# Patient Record
Sex: Male | Born: 1935
Health system: Southern US, Community
[De-identification: ages and names within clinical notes are randomized; demographics above are authoritative.]

## PROBLEM LIST (undated history)

## (undated) DIAGNOSIS — I469 Cardiac arrest, cause unspecified: Secondary | ICD-10-CM

## (undated) DIAGNOSIS — J9600 Acute respiratory failure, unspecified whether with hypoxia or hypercapnia: Secondary | ICD-10-CM

## (undated) DIAGNOSIS — Z79899 Other long term (current) drug therapy: Secondary | ICD-10-CM

## (undated) DIAGNOSIS — I5020 Unspecified systolic (congestive) heart failure: Secondary | ICD-10-CM

## (undated) DIAGNOSIS — I472 Ventricular tachycardia, unspecified: Secondary | ICD-10-CM

## (undated) DIAGNOSIS — E785 Hyperlipidemia, unspecified: Secondary | ICD-10-CM

## (undated) DIAGNOSIS — I10 Essential (primary) hypertension: Secondary | ICD-10-CM

## (undated) DIAGNOSIS — N400 Enlarged prostate without lower urinary tract symptoms: Secondary | ICD-10-CM

## (undated) DIAGNOSIS — G5600 Carpal tunnel syndrome, unspecified upper limb: Secondary | ICD-10-CM

## (undated) DIAGNOSIS — R7989 Other specified abnormal findings of blood chemistry: Secondary | ICD-10-CM

## (undated) DIAGNOSIS — N189 Chronic kidney disease, unspecified: Secondary | ICD-10-CM

## (undated) DIAGNOSIS — I213 ST elevation (STEMI) myocardial infarction of unspecified site: Secondary | ICD-10-CM

## (undated) DIAGNOSIS — E119 Type 2 diabetes mellitus without complications: Secondary | ICD-10-CM

## (undated) HISTORY — DX: Carpal tunnel syndrome, unspecified upper limb: G56.00

## (undated) HISTORY — DX: Benign prostatic hyperplasia without lower urinary tract symptoms: N40.0

## (undated) HISTORY — PX: BACK SURGERY: SHX140

## (undated) HISTORY — PX: WRIST SURGERY: SHX841

## (undated) HISTORY — DX: Other long term (current) drug therapy: Z79.899

## (undated) HISTORY — DX: Other specified abnormal findings of blood chemistry: R79.89

## (undated) HISTORY — DX: Chronic kidney disease, unspecified: N18.9

## (undated) HISTORY — DX: Type 2 diabetes mellitus without complications: E11.9

---

## 2014-11-10 DIAGNOSIS — E669 Obesity, unspecified: Secondary | ICD-10-CM | POA: Diagnosis not present

## 2014-11-10 DIAGNOSIS — Z125 Encounter for screening for malignant neoplasm of prostate: Secondary | ICD-10-CM | POA: Diagnosis not present

## 2014-11-10 DIAGNOSIS — Z1211 Encounter for screening for malignant neoplasm of colon: Secondary | ICD-10-CM | POA: Diagnosis not present

## 2014-11-10 DIAGNOSIS — M5430 Sciatica, unspecified side: Secondary | ICD-10-CM | POA: Diagnosis not present

## 2014-11-10 DIAGNOSIS — Z136 Encounter for screening for cardiovascular disorders: Secondary | ICD-10-CM | POA: Diagnosis not present

## 2014-11-10 DIAGNOSIS — Z79899 Other long term (current) drug therapy: Secondary | ICD-10-CM | POA: Diagnosis not present

## 2014-11-10 DIAGNOSIS — I10 Essential (primary) hypertension: Secondary | ICD-10-CM | POA: Diagnosis not present

## 2014-11-10 DIAGNOSIS — E1169 Type 2 diabetes mellitus with other specified complication: Secondary | ICD-10-CM | POA: Diagnosis not present

## 2014-11-10 DIAGNOSIS — Z6832 Body mass index (BMI) 32.0-32.9, adult: Secondary | ICD-10-CM | POA: Diagnosis not present

## 2014-11-10 DIAGNOSIS — E559 Vitamin D deficiency, unspecified: Secondary | ICD-10-CM | POA: Diagnosis not present

## 2014-11-10 DIAGNOSIS — E785 Hyperlipidemia, unspecified: Secondary | ICD-10-CM | POA: Diagnosis not present

## 2014-11-10 DIAGNOSIS — Z0000 Encounter for general adult medical examination without abnormal findings: Secondary | ICD-10-CM | POA: Diagnosis not present

## 2014-12-02 DIAGNOSIS — M629 Disorder of muscle, unspecified: Secondary | ICD-10-CM | POA: Diagnosis not present

## 2014-12-08 DIAGNOSIS — M629 Disorder of muscle, unspecified: Secondary | ICD-10-CM | POA: Diagnosis not present

## 2014-12-11 DIAGNOSIS — M629 Disorder of muscle, unspecified: Secondary | ICD-10-CM | POA: Diagnosis not present

## 2014-12-16 DIAGNOSIS — M629 Disorder of muscle, unspecified: Secondary | ICD-10-CM | POA: Diagnosis not present

## 2014-12-16 DIAGNOSIS — Z79899 Other long term (current) drug therapy: Secondary | ICD-10-CM | POA: Diagnosis not present

## 2014-12-16 DIAGNOSIS — I10 Essential (primary) hypertension: Secondary | ICD-10-CM | POA: Diagnosis not present

## 2014-12-18 DIAGNOSIS — M629 Disorder of muscle, unspecified: Secondary | ICD-10-CM | POA: Diagnosis not present

## 2014-12-22 DIAGNOSIS — M629 Disorder of muscle, unspecified: Secondary | ICD-10-CM | POA: Diagnosis not present

## 2014-12-25 DIAGNOSIS — M629 Disorder of muscle, unspecified: Secondary | ICD-10-CM | POA: Diagnosis not present

## 2014-12-30 DIAGNOSIS — M629 Disorder of muscle, unspecified: Secondary | ICD-10-CM | POA: Diagnosis not present

## 2015-01-01 DIAGNOSIS — M629 Disorder of muscle, unspecified: Secondary | ICD-10-CM | POA: Diagnosis not present

## 2015-01-06 DIAGNOSIS — M629 Disorder of muscle, unspecified: Secondary | ICD-10-CM | POA: Diagnosis not present

## 2015-01-12 DIAGNOSIS — R05 Cough: Secondary | ICD-10-CM | POA: Diagnosis not present

## 2015-01-12 DIAGNOSIS — J0100 Acute maxillary sinusitis, unspecified: Secondary | ICD-10-CM | POA: Diagnosis not present

## 2015-01-13 DIAGNOSIS — M629 Disorder of muscle, unspecified: Secondary | ICD-10-CM | POA: Diagnosis not present

## 2015-01-16 DIAGNOSIS — M629 Disorder of muscle, unspecified: Secondary | ICD-10-CM | POA: Diagnosis not present

## 2015-01-20 DIAGNOSIS — M629 Disorder of muscle, unspecified: Secondary | ICD-10-CM | POA: Diagnosis not present

## 2015-01-22 DIAGNOSIS — M629 Disorder of muscle, unspecified: Secondary | ICD-10-CM | POA: Diagnosis not present

## 2015-01-26 DIAGNOSIS — M79662 Pain in left lower leg: Secondary | ICD-10-CM | POA: Diagnosis not present

## 2015-01-26 DIAGNOSIS — R202 Paresthesia of skin: Secondary | ICD-10-CM | POA: Diagnosis not present

## 2015-01-29 DIAGNOSIS — M5416 Radiculopathy, lumbar region: Secondary | ICD-10-CM | POA: Diagnosis not present

## 2015-02-03 DIAGNOSIS — M5417 Radiculopathy, lumbosacral region: Secondary | ICD-10-CM | POA: Diagnosis not present

## 2015-02-11 DIAGNOSIS — M79662 Pain in left lower leg: Secondary | ICD-10-CM | POA: Diagnosis not present

## 2015-02-11 DIAGNOSIS — M5417 Radiculopathy, lumbosacral region: Secondary | ICD-10-CM | POA: Diagnosis not present

## 2015-02-18 DIAGNOSIS — M79662 Pain in left lower leg: Secondary | ICD-10-CM | POA: Diagnosis not present

## 2015-02-18 DIAGNOSIS — M79659 Pain in unspecified thigh: Secondary | ICD-10-CM | POA: Diagnosis not present

## 2015-03-03 DIAGNOSIS — Z95820 Peripheral vascular angioplasty status with implants and grafts: Secondary | ICD-10-CM | POA: Diagnosis not present

## 2015-03-03 DIAGNOSIS — I6523 Occlusion and stenosis of bilateral carotid arteries: Secondary | ICD-10-CM | POA: Diagnosis not present

## 2015-03-11 DIAGNOSIS — M79661 Pain in right lower leg: Secondary | ICD-10-CM | POA: Diagnosis not present

## 2015-03-11 DIAGNOSIS — M79662 Pain in left lower leg: Secondary | ICD-10-CM | POA: Diagnosis not present

## 2015-03-16 DIAGNOSIS — M79662 Pain in left lower leg: Secondary | ICD-10-CM | POA: Diagnosis not present

## 2015-03-18 DIAGNOSIS — M545 Low back pain: Secondary | ICD-10-CM | POA: Diagnosis not present

## 2015-03-20 DIAGNOSIS — M5126 Other intervertebral disc displacement, lumbar region: Secondary | ICD-10-CM | POA: Diagnosis not present

## 2015-03-20 DIAGNOSIS — M4807 Spinal stenosis, lumbosacral region: Secondary | ICD-10-CM | POA: Diagnosis not present

## 2015-03-23 DIAGNOSIS — I6523 Occlusion and stenosis of bilateral carotid arteries: Secondary | ICD-10-CM | POA: Diagnosis not present

## 2015-03-23 DIAGNOSIS — M5126 Other intervertebral disc displacement, lumbar region: Secondary | ICD-10-CM | POA: Diagnosis not present

## 2015-03-23 DIAGNOSIS — M4806 Spinal stenosis, lumbar region: Secondary | ICD-10-CM | POA: Diagnosis not present

## 2015-03-23 DIAGNOSIS — M5136 Other intervertebral disc degeneration, lumbar region: Secondary | ICD-10-CM | POA: Diagnosis not present

## 2015-03-23 DIAGNOSIS — E785 Hyperlipidemia, unspecified: Secondary | ICD-10-CM | POA: Diagnosis not present

## 2015-03-23 DIAGNOSIS — I10 Essential (primary) hypertension: Secondary | ICD-10-CM | POA: Diagnosis not present

## 2015-03-23 DIAGNOSIS — M47816 Spondylosis without myelopathy or radiculopathy, lumbar region: Secondary | ICD-10-CM | POA: Diagnosis not present

## 2015-03-23 DIAGNOSIS — E1169 Type 2 diabetes mellitus with other specified complication: Secondary | ICD-10-CM | POA: Diagnosis not present

## 2015-03-24 DIAGNOSIS — M4316 Spondylolisthesis, lumbar region: Secondary | ICD-10-CM | POA: Diagnosis not present

## 2015-03-24 DIAGNOSIS — M4806 Spinal stenosis, lumbar region: Secondary | ICD-10-CM | POA: Diagnosis not present

## 2015-03-27 DIAGNOSIS — M5414 Radiculopathy, thoracic region: Secondary | ICD-10-CM | POA: Diagnosis not present

## 2015-03-27 DIAGNOSIS — M4806 Spinal stenosis, lumbar region: Secondary | ICD-10-CM | POA: Diagnosis not present

## 2015-04-24 DIAGNOSIS — M4806 Spinal stenosis, lumbar region: Secondary | ICD-10-CM | POA: Diagnosis not present

## 2015-04-24 DIAGNOSIS — M5416 Radiculopathy, lumbar region: Secondary | ICD-10-CM | POA: Diagnosis not present

## 2015-05-05 DIAGNOSIS — C44629 Squamous cell carcinoma of skin of left upper limb, including shoulder: Secondary | ICD-10-CM | POA: Diagnosis not present

## 2015-05-08 DIAGNOSIS — M5416 Radiculopathy, lumbar region: Secondary | ICD-10-CM | POA: Diagnosis not present

## 2015-05-08 DIAGNOSIS — M4806 Spinal stenosis, lumbar region: Secondary | ICD-10-CM | POA: Diagnosis not present

## 2015-05-12 DIAGNOSIS — Z79899 Other long term (current) drug therapy: Secondary | ICD-10-CM | POA: Diagnosis not present

## 2015-05-12 DIAGNOSIS — E785 Hyperlipidemia, unspecified: Secondary | ICD-10-CM | POA: Diagnosis not present

## 2015-05-12 DIAGNOSIS — E1169 Type 2 diabetes mellitus with other specified complication: Secondary | ICD-10-CM | POA: Diagnosis not present

## 2015-05-12 DIAGNOSIS — M5430 Sciatica, unspecified side: Secondary | ICD-10-CM | POA: Diagnosis not present

## 2015-05-12 DIAGNOSIS — Z23 Encounter for immunization: Secondary | ICD-10-CM | POA: Diagnosis not present

## 2015-05-12 DIAGNOSIS — I10 Essential (primary) hypertension: Secondary | ICD-10-CM | POA: Diagnosis not present

## 2015-05-26 DIAGNOSIS — M4806 Spinal stenosis, lumbar region: Secondary | ICD-10-CM | POA: Diagnosis not present

## 2015-05-28 DIAGNOSIS — Z01810 Encounter for preprocedural cardiovascular examination: Secondary | ICD-10-CM | POA: Diagnosis not present

## 2015-05-28 DIAGNOSIS — M79609 Pain in unspecified limb: Secondary | ICD-10-CM | POA: Diagnosis not present

## 2015-05-28 DIAGNOSIS — Z01812 Encounter for preprocedural laboratory examination: Secondary | ICD-10-CM | POA: Diagnosis not present

## 2015-05-28 DIAGNOSIS — Z79899 Other long term (current) drug therapy: Secondary | ICD-10-CM | POA: Diagnosis not present

## 2015-06-15 DIAGNOSIS — L578 Other skin changes due to chronic exposure to nonionizing radiation: Secondary | ICD-10-CM | POA: Diagnosis not present

## 2015-06-15 DIAGNOSIS — L570 Actinic keratosis: Secondary | ICD-10-CM | POA: Diagnosis not present

## 2015-06-16 DIAGNOSIS — E1169 Type 2 diabetes mellitus with other specified complication: Secondary | ICD-10-CM | POA: Diagnosis not present

## 2015-06-16 DIAGNOSIS — J309 Allergic rhinitis, unspecified: Secondary | ICD-10-CM | POA: Diagnosis not present

## 2015-06-16 DIAGNOSIS — I10 Essential (primary) hypertension: Secondary | ICD-10-CM | POA: Diagnosis not present

## 2015-06-16 DIAGNOSIS — Z79899 Other long term (current) drug therapy: Secondary | ICD-10-CM | POA: Diagnosis not present

## 2015-06-22 DIAGNOSIS — E785 Hyperlipidemia, unspecified: Secondary | ICD-10-CM | POA: Diagnosis not present

## 2015-06-22 DIAGNOSIS — Z01818 Encounter for other preprocedural examination: Secondary | ICD-10-CM | POA: Diagnosis not present

## 2015-06-22 DIAGNOSIS — D649 Anemia, unspecified: Secondary | ICD-10-CM | POA: Diagnosis not present

## 2015-06-22 DIAGNOSIS — I10 Essential (primary) hypertension: Secondary | ICD-10-CM | POA: Diagnosis not present

## 2015-06-22 DIAGNOSIS — Z23 Encounter for immunization: Secondary | ICD-10-CM | POA: Diagnosis not present

## 2015-06-22 DIAGNOSIS — Z79899 Other long term (current) drug therapy: Secondary | ICD-10-CM | POA: Diagnosis not present

## 2015-06-22 DIAGNOSIS — M5417 Radiculopathy, lumbosacral region: Secondary | ICD-10-CM | POA: Diagnosis not present

## 2015-06-22 DIAGNOSIS — E1169 Type 2 diabetes mellitus with other specified complication: Secondary | ICD-10-CM | POA: Diagnosis not present

## 2015-06-24 DIAGNOSIS — M4806 Spinal stenosis, lumbar region: Secondary | ICD-10-CM | POA: Diagnosis not present

## 2015-06-24 DIAGNOSIS — E785 Hyperlipidemia, unspecified: Secondary | ICD-10-CM | POA: Diagnosis not present

## 2015-06-24 DIAGNOSIS — I739 Peripheral vascular disease, unspecified: Secondary | ICD-10-CM | POA: Diagnosis not present

## 2015-06-24 DIAGNOSIS — M4316 Spondylolisthesis, lumbar region: Secondary | ICD-10-CM | POA: Diagnosis not present

## 2015-06-24 DIAGNOSIS — E119 Type 2 diabetes mellitus without complications: Secondary | ICD-10-CM | POA: Insufficient documentation

## 2015-06-24 DIAGNOSIS — I10 Essential (primary) hypertension: Secondary | ICD-10-CM | POA: Insufficient documentation

## 2015-06-24 DIAGNOSIS — Z01818 Encounter for other preprocedural examination: Secondary | ICD-10-CM | POA: Diagnosis not present

## 2015-06-24 DIAGNOSIS — E118 Type 2 diabetes mellitus with unspecified complications: Secondary | ICD-10-CM | POA: Insufficient documentation

## 2015-06-25 DIAGNOSIS — M545 Low back pain: Secondary | ICD-10-CM | POA: Diagnosis not present

## 2015-06-25 DIAGNOSIS — R001 Bradycardia, unspecified: Secondary | ICD-10-CM | POA: Diagnosis not present

## 2015-06-25 DIAGNOSIS — Z01810 Encounter for preprocedural cardiovascular examination: Secondary | ICD-10-CM | POA: Diagnosis not present

## 2015-06-25 DIAGNOSIS — I10 Essential (primary) hypertension: Secondary | ICD-10-CM | POA: Diagnosis not present

## 2015-06-29 DIAGNOSIS — Z87891 Personal history of nicotine dependence: Secondary | ICD-10-CM | POA: Diagnosis not present

## 2015-06-29 DIAGNOSIS — E119 Type 2 diabetes mellitus without complications: Secondary | ICD-10-CM | POA: Diagnosis present

## 2015-06-29 DIAGNOSIS — M5116 Intervertebral disc disorders with radiculopathy, lumbar region: Secondary | ICD-10-CM | POA: Diagnosis not present

## 2015-06-29 DIAGNOSIS — G4733 Obstructive sleep apnea (adult) (pediatric): Secondary | ICD-10-CM | POA: Diagnosis not present

## 2015-06-29 DIAGNOSIS — Z7982 Long term (current) use of aspirin: Secondary | ICD-10-CM | POA: Diagnosis not present

## 2015-06-29 DIAGNOSIS — Z79899 Other long term (current) drug therapy: Secondary | ICD-10-CM | POA: Diagnosis not present

## 2015-06-29 DIAGNOSIS — M5416 Radiculopathy, lumbar region: Secondary | ICD-10-CM | POA: Diagnosis not present

## 2015-06-29 DIAGNOSIS — M5136 Other intervertebral disc degeneration, lumbar region: Secondary | ICD-10-CM | POA: Diagnosis not present

## 2015-06-29 DIAGNOSIS — M4316 Spondylolisthesis, lumbar region: Secondary | ICD-10-CM | POA: Diagnosis present

## 2015-06-29 DIAGNOSIS — I10 Essential (primary) hypertension: Secondary | ICD-10-CM | POA: Diagnosis present

## 2015-06-29 DIAGNOSIS — M545 Low back pain: Secondary | ICD-10-CM | POA: Diagnosis not present

## 2015-06-29 DIAGNOSIS — M4806 Spinal stenosis, lumbar region: Secondary | ICD-10-CM | POA: Diagnosis present

## 2015-07-01 DIAGNOSIS — I10 Essential (primary) hypertension: Secondary | ICD-10-CM | POA: Diagnosis not present

## 2015-07-01 DIAGNOSIS — Z4789 Encounter for other orthopedic aftercare: Secondary | ICD-10-CM | POA: Diagnosis not present

## 2015-07-01 DIAGNOSIS — E119 Type 2 diabetes mellitus without complications: Secondary | ICD-10-CM | POA: Diagnosis not present

## 2015-07-01 DIAGNOSIS — Z7984 Long term (current) use of oral hypoglycemic drugs: Secondary | ICD-10-CM | POA: Diagnosis not present

## 2015-07-03 DIAGNOSIS — Z7984 Long term (current) use of oral hypoglycemic drugs: Secondary | ICD-10-CM | POA: Diagnosis not present

## 2015-07-03 DIAGNOSIS — E119 Type 2 diabetes mellitus without complications: Secondary | ICD-10-CM | POA: Diagnosis not present

## 2015-07-03 DIAGNOSIS — I10 Essential (primary) hypertension: Secondary | ICD-10-CM | POA: Diagnosis not present

## 2015-07-03 DIAGNOSIS — Z4789 Encounter for other orthopedic aftercare: Secondary | ICD-10-CM | POA: Diagnosis not present

## 2015-07-06 DIAGNOSIS — E119 Type 2 diabetes mellitus without complications: Secondary | ICD-10-CM | POA: Diagnosis not present

## 2015-07-06 DIAGNOSIS — I10 Essential (primary) hypertension: Secondary | ICD-10-CM | POA: Diagnosis not present

## 2015-07-06 DIAGNOSIS — Z7984 Long term (current) use of oral hypoglycemic drugs: Secondary | ICD-10-CM | POA: Diagnosis not present

## 2015-07-06 DIAGNOSIS — Z4789 Encounter for other orthopedic aftercare: Secondary | ICD-10-CM | POA: Diagnosis not present

## 2015-07-07 DIAGNOSIS — I10 Essential (primary) hypertension: Secondary | ICD-10-CM | POA: Diagnosis not present

## 2015-07-07 DIAGNOSIS — D649 Anemia, unspecified: Secondary | ICD-10-CM | POA: Diagnosis not present

## 2015-07-07 DIAGNOSIS — M5417 Radiculopathy, lumbosacral region: Secondary | ICD-10-CM | POA: Diagnosis not present

## 2015-07-07 DIAGNOSIS — E785 Hyperlipidemia, unspecified: Secondary | ICD-10-CM | POA: Diagnosis not present

## 2015-07-07 DIAGNOSIS — E1169 Type 2 diabetes mellitus with other specified complication: Secondary | ICD-10-CM | POA: Diagnosis not present

## 2015-07-07 DIAGNOSIS — M4806 Spinal stenosis, lumbar region: Secondary | ICD-10-CM | POA: Diagnosis not present

## 2015-07-07 DIAGNOSIS — Z79899 Other long term (current) drug therapy: Secondary | ICD-10-CM | POA: Diagnosis not present

## 2015-07-07 DIAGNOSIS — Z981 Arthrodesis status: Secondary | ICD-10-CM | POA: Diagnosis not present

## 2015-07-07 DIAGNOSIS — K5900 Constipation, unspecified: Secondary | ICD-10-CM | POA: Diagnosis not present

## 2015-07-08 DIAGNOSIS — E119 Type 2 diabetes mellitus without complications: Secondary | ICD-10-CM | POA: Diagnosis not present

## 2015-07-08 DIAGNOSIS — I10 Essential (primary) hypertension: Secondary | ICD-10-CM | POA: Diagnosis not present

## 2015-07-08 DIAGNOSIS — Z4789 Encounter for other orthopedic aftercare: Secondary | ICD-10-CM | POA: Diagnosis not present

## 2015-07-08 DIAGNOSIS — Z7984 Long term (current) use of oral hypoglycemic drugs: Secondary | ICD-10-CM | POA: Diagnosis not present

## 2015-07-09 DIAGNOSIS — Z1211 Encounter for screening for malignant neoplasm of colon: Secondary | ICD-10-CM | POA: Diagnosis not present

## 2015-07-09 DIAGNOSIS — D649 Anemia, unspecified: Secondary | ICD-10-CM | POA: Diagnosis not present

## 2015-07-10 DIAGNOSIS — Z7984 Long term (current) use of oral hypoglycemic drugs: Secondary | ICD-10-CM | POA: Diagnosis not present

## 2015-07-10 DIAGNOSIS — Z4789 Encounter for other orthopedic aftercare: Secondary | ICD-10-CM | POA: Diagnosis not present

## 2015-07-10 DIAGNOSIS — I10 Essential (primary) hypertension: Secondary | ICD-10-CM | POA: Diagnosis not present

## 2015-07-10 DIAGNOSIS — E119 Type 2 diabetes mellitus without complications: Secondary | ICD-10-CM | POA: Diagnosis not present

## 2015-08-07 DIAGNOSIS — M79641 Pain in right hand: Secondary | ICD-10-CM | POA: Diagnosis not present

## 2015-08-07 DIAGNOSIS — S0231XA Fracture of orbital floor, right side, initial encounter for closed fracture: Secondary | ICD-10-CM | POA: Diagnosis not present

## 2015-08-07 DIAGNOSIS — S0531XA Ocular laceration without prolapse or loss of intraocular tissue, right eye, initial encounter: Secondary | ICD-10-CM | POA: Diagnosis not present

## 2015-08-07 DIAGNOSIS — S61411A Laceration without foreign body of right hand, initial encounter: Secondary | ICD-10-CM | POA: Diagnosis not present

## 2015-08-07 DIAGNOSIS — S51811A Laceration without foreign body of right forearm, initial encounter: Secondary | ICD-10-CM | POA: Diagnosis not present

## 2015-08-07 DIAGNOSIS — S0230XA Fracture of orbital floor, unspecified side, initial encounter for closed fracture: Secondary | ICD-10-CM | POA: Diagnosis not present

## 2015-08-07 DIAGNOSIS — S0181XA Laceration without foreign body of other part of head, initial encounter: Secondary | ICD-10-CM | POA: Diagnosis not present

## 2015-08-07 DIAGNOSIS — M25531 Pain in right wrist: Secondary | ICD-10-CM | POA: Diagnosis not present

## 2015-08-07 DIAGNOSIS — R0781 Pleurodynia: Secondary | ICD-10-CM | POA: Diagnosis not present

## 2015-08-08 DIAGNOSIS — S0231XA Fracture of orbital floor, right side, initial encounter for closed fracture: Secondary | ICD-10-CM | POA: Diagnosis not present

## 2015-08-08 DIAGNOSIS — S60512A Abrasion of left hand, initial encounter: Secondary | ICD-10-CM | POA: Diagnosis not present

## 2015-08-08 DIAGNOSIS — S0281XA Fracture of other specified skull and facial bones, right side, initial encounter for closed fracture: Secondary | ICD-10-CM | POA: Diagnosis not present

## 2015-08-08 DIAGNOSIS — S60812A Abrasion of left wrist, initial encounter: Secondary | ICD-10-CM | POA: Diagnosis not present

## 2015-08-08 DIAGNOSIS — Z8639 Personal history of other endocrine, nutritional and metabolic disease: Secondary | ICD-10-CM | POA: Diagnosis not present

## 2015-08-08 DIAGNOSIS — Z7982 Long term (current) use of aspirin: Secondary | ICD-10-CM | POA: Diagnosis not present

## 2015-08-08 DIAGNOSIS — Z8669 Personal history of other diseases of the nervous system and sense organs: Secondary | ICD-10-CM | POA: Diagnosis not present

## 2015-08-11 DIAGNOSIS — M5417 Radiculopathy, lumbosacral region: Secondary | ICD-10-CM | POA: Diagnosis not present

## 2015-08-11 DIAGNOSIS — K5900 Constipation, unspecified: Secondary | ICD-10-CM | POA: Diagnosis not present

## 2015-08-11 DIAGNOSIS — M4806 Spinal stenosis, lumbar region: Secondary | ICD-10-CM | POA: Diagnosis not present

## 2015-08-11 DIAGNOSIS — D649 Anemia, unspecified: Secondary | ICD-10-CM | POA: Diagnosis not present

## 2015-08-11 DIAGNOSIS — E1169 Type 2 diabetes mellitus with other specified complication: Secondary | ICD-10-CM | POA: Diagnosis not present

## 2015-08-11 DIAGNOSIS — S0230XA Fracture of orbital floor, unspecified side, initial encounter for closed fracture: Secondary | ICD-10-CM | POA: Diagnosis not present

## 2015-08-11 DIAGNOSIS — I10 Essential (primary) hypertension: Secondary | ICD-10-CM | POA: Diagnosis not present

## 2015-08-11 DIAGNOSIS — Z79899 Other long term (current) drug therapy: Secondary | ICD-10-CM | POA: Diagnosis not present

## 2015-08-19 DIAGNOSIS — Z7982 Long term (current) use of aspirin: Secondary | ICD-10-CM | POA: Diagnosis not present

## 2015-08-19 DIAGNOSIS — H43813 Vitreous degeneration, bilateral: Secondary | ICD-10-CM | POA: Diagnosis not present

## 2015-08-19 DIAGNOSIS — Z4802 Encounter for removal of sutures: Secondary | ICD-10-CM | POA: Diagnosis not present

## 2015-08-19 DIAGNOSIS — S0081XD Abrasion of other part of head, subsequent encounter: Secondary | ICD-10-CM | POA: Diagnosis not present

## 2015-08-19 DIAGNOSIS — S0231XD Fracture of orbital floor, right side, subsequent encounter for fracture with routine healing: Secondary | ICD-10-CM | POA: Diagnosis not present

## 2015-08-19 DIAGNOSIS — Z961 Presence of intraocular lens: Secondary | ICD-10-CM | POA: Diagnosis not present

## 2015-08-19 DIAGNOSIS — H40053 Ocular hypertension, bilateral: Secondary | ICD-10-CM | POA: Diagnosis not present

## 2015-08-19 DIAGNOSIS — H02836 Dermatochalasis of left eye, unspecified eyelid: Secondary | ICD-10-CM | POA: Diagnosis not present

## 2015-08-19 DIAGNOSIS — Z79899 Other long term (current) drug therapy: Secondary | ICD-10-CM | POA: Diagnosis not present

## 2015-08-19 DIAGNOSIS — H02833 Dermatochalasis of right eye, unspecified eyelid: Secondary | ICD-10-CM | POA: Diagnosis not present

## 2015-08-19 DIAGNOSIS — S0280XD Fracture of other specified skull and facial bones, unspecified side, subsequent encounter for fracture with routine healing: Secondary | ICD-10-CM | POA: Diagnosis not present

## 2015-08-19 DIAGNOSIS — I10 Essential (primary) hypertension: Secondary | ICD-10-CM | POA: Diagnosis not present

## 2015-08-19 DIAGNOSIS — E119 Type 2 diabetes mellitus without complications: Secondary | ICD-10-CM | POA: Diagnosis not present

## 2015-09-02 DIAGNOSIS — Z95820 Peripheral vascular angioplasty status with implants and grafts: Secondary | ICD-10-CM | POA: Diagnosis not present

## 2015-09-02 DIAGNOSIS — I6523 Occlusion and stenosis of bilateral carotid arteries: Secondary | ICD-10-CM | POA: Diagnosis not present

## 2015-09-09 DIAGNOSIS — E785 Hyperlipidemia, unspecified: Secondary | ICD-10-CM | POA: Diagnosis not present

## 2015-09-09 DIAGNOSIS — E1169 Type 2 diabetes mellitus with other specified complication: Secondary | ICD-10-CM | POA: Diagnosis not present

## 2015-09-17 DIAGNOSIS — H02836 Dermatochalasis of left eye, unspecified eyelid: Secondary | ICD-10-CM | POA: Diagnosis not present

## 2015-09-17 DIAGNOSIS — Z7982 Long term (current) use of aspirin: Secondary | ICD-10-CM | POA: Diagnosis not present

## 2015-09-17 DIAGNOSIS — I10 Essential (primary) hypertension: Secondary | ICD-10-CM | POA: Diagnosis not present

## 2015-09-17 DIAGNOSIS — Z79899 Other long term (current) drug therapy: Secondary | ICD-10-CM | POA: Diagnosis not present

## 2015-09-17 DIAGNOSIS — S0081XD Abrasion of other part of head, subsequent encounter: Secondary | ICD-10-CM | POA: Diagnosis not present

## 2015-09-17 DIAGNOSIS — H43813 Vitreous degeneration, bilateral: Secondary | ICD-10-CM | POA: Diagnosis not present

## 2015-09-17 DIAGNOSIS — H40053 Ocular hypertension, bilateral: Secondary | ICD-10-CM | POA: Diagnosis not present

## 2015-09-17 DIAGNOSIS — E119 Type 2 diabetes mellitus without complications: Secondary | ICD-10-CM | POA: Diagnosis not present

## 2015-09-17 DIAGNOSIS — Z961 Presence of intraocular lens: Secondary | ICD-10-CM | POA: Diagnosis not present

## 2015-09-17 DIAGNOSIS — S0280XD Fracture of other specified skull and facial bones, unspecified side, subsequent encounter for fracture with routine healing: Secondary | ICD-10-CM | POA: Diagnosis not present

## 2015-09-17 DIAGNOSIS — H02833 Dermatochalasis of right eye, unspecified eyelid: Secondary | ICD-10-CM | POA: Diagnosis not present

## 2015-09-30 DIAGNOSIS — G5600 Carpal tunnel syndrome, unspecified upper limb: Secondary | ICD-10-CM | POA: Diagnosis not present

## 2015-09-30 DIAGNOSIS — M47812 Spondylosis without myelopathy or radiculopathy, cervical region: Secondary | ICD-10-CM | POA: Diagnosis not present

## 2015-10-08 DIAGNOSIS — G5601 Carpal tunnel syndrome, right upper limb: Secondary | ICD-10-CM | POA: Diagnosis not present

## 2015-10-08 DIAGNOSIS — G5602 Carpal tunnel syndrome, left upper limb: Secondary | ICD-10-CM | POA: Diagnosis not present

## 2015-10-08 DIAGNOSIS — M5412 Radiculopathy, cervical region: Secondary | ICD-10-CM | POA: Diagnosis not present

## 2015-10-16 DIAGNOSIS — G5600 Carpal tunnel syndrome, unspecified upper limb: Secondary | ICD-10-CM | POA: Diagnosis not present

## 2015-10-16 DIAGNOSIS — M47812 Spondylosis without myelopathy or radiculopathy, cervical region: Secondary | ICD-10-CM | POA: Diagnosis not present

## 2015-10-19 DIAGNOSIS — E119 Type 2 diabetes mellitus without complications: Secondary | ICD-10-CM | POA: Diagnosis not present

## 2015-10-23 DIAGNOSIS — N3000 Acute cystitis without hematuria: Secondary | ICD-10-CM | POA: Diagnosis not present

## 2015-10-23 DIAGNOSIS — N3090 Cystitis, unspecified without hematuria: Secondary | ICD-10-CM | POA: Diagnosis not present

## 2015-11-27 DIAGNOSIS — G5600 Carpal tunnel syndrome, unspecified upper limb: Secondary | ICD-10-CM | POA: Diagnosis not present

## 2015-11-27 DIAGNOSIS — M47812 Spondylosis without myelopathy or radiculopathy, cervical region: Secondary | ICD-10-CM | POA: Diagnosis not present

## 2015-12-17 DIAGNOSIS — E119 Type 2 diabetes mellitus without complications: Secondary | ICD-10-CM | POA: Diagnosis not present

## 2015-12-28 DIAGNOSIS — E119 Type 2 diabetes mellitus without complications: Secondary | ICD-10-CM | POA: Diagnosis not present

## 2016-01-07 DIAGNOSIS — M5441 Lumbago with sciatica, right side: Secondary | ICD-10-CM | POA: Diagnosis not present

## 2016-01-07 DIAGNOSIS — M25512 Pain in left shoulder: Secondary | ICD-10-CM | POA: Diagnosis not present

## 2016-01-07 DIAGNOSIS — M25511 Pain in right shoulder: Secondary | ICD-10-CM | POA: Diagnosis not present

## 2016-01-13 DIAGNOSIS — M75122 Complete rotator cuff tear or rupture of left shoulder, not specified as traumatic: Secondary | ICD-10-CM | POA: Diagnosis not present

## 2016-01-13 DIAGNOSIS — M25512 Pain in left shoulder: Secondary | ICD-10-CM | POA: Diagnosis not present

## 2016-01-13 DIAGNOSIS — S46112A Strain of muscle, fascia and tendon of long head of biceps, left arm, initial encounter: Secondary | ICD-10-CM | POA: Diagnosis not present

## 2016-01-13 DIAGNOSIS — X58XXXA Exposure to other specified factors, initial encounter: Secondary | ICD-10-CM | POA: Diagnosis not present

## 2016-01-15 DIAGNOSIS — M25512 Pain in left shoulder: Secondary | ICD-10-CM | POA: Diagnosis not present

## 2016-01-15 DIAGNOSIS — M75122 Complete rotator cuff tear or rupture of left shoulder, not specified as traumatic: Secondary | ICD-10-CM | POA: Diagnosis not present

## 2016-02-03 DIAGNOSIS — M12812 Other specific arthropathies, not elsewhere classified, left shoulder: Secondary | ICD-10-CM | POA: Diagnosis not present

## 2016-02-10 DIAGNOSIS — M5441 Lumbago with sciatica, right side: Secondary | ICD-10-CM | POA: Diagnosis not present

## 2016-02-10 DIAGNOSIS — M4806 Spinal stenosis, lumbar region: Secondary | ICD-10-CM | POA: Diagnosis not present

## 2016-02-11 DIAGNOSIS — E559 Vitamin D deficiency, unspecified: Secondary | ICD-10-CM | POA: Diagnosis not present

## 2016-02-11 DIAGNOSIS — N182 Chronic kidney disease, stage 2 (mild): Secondary | ICD-10-CM | POA: Diagnosis not present

## 2016-02-11 DIAGNOSIS — Z87898 Personal history of other specified conditions: Secondary | ICD-10-CM | POA: Diagnosis not present

## 2016-02-11 DIAGNOSIS — E7800 Pure hypercholesterolemia, unspecified: Secondary | ICD-10-CM | POA: Diagnosis not present

## 2016-02-11 DIAGNOSIS — Z79899 Other long term (current) drug therapy: Secondary | ICD-10-CM | POA: Diagnosis not present

## 2016-02-11 DIAGNOSIS — E1122 Type 2 diabetes mellitus with diabetic chronic kidney disease: Secondary | ICD-10-CM | POA: Diagnosis not present

## 2016-02-11 DIAGNOSIS — N400 Enlarged prostate without lower urinary tract symptoms: Secondary | ICD-10-CM | POA: Diagnosis not present

## 2016-02-11 DIAGNOSIS — I129 Hypertensive chronic kidney disease with stage 1 through stage 4 chronic kidney disease, or unspecified chronic kidney disease: Secondary | ICD-10-CM | POA: Diagnosis not present

## 2016-03-25 DIAGNOSIS — I6523 Occlusion and stenosis of bilateral carotid arteries: Secondary | ICD-10-CM | POA: Diagnosis not present

## 2016-03-25 DIAGNOSIS — Z95820 Peripheral vascular angioplasty status with implants and grafts: Secondary | ICD-10-CM | POA: Diagnosis not present

## 2016-04-06 DIAGNOSIS — I6523 Occlusion and stenosis of bilateral carotid arteries: Secondary | ICD-10-CM | POA: Diagnosis not present

## 2016-04-06 DIAGNOSIS — I10 Essential (primary) hypertension: Secondary | ICD-10-CM | POA: Diagnosis not present

## 2016-04-06 DIAGNOSIS — E119 Type 2 diabetes mellitus without complications: Secondary | ICD-10-CM | POA: Diagnosis not present

## 2016-04-06 DIAGNOSIS — E785 Hyperlipidemia, unspecified: Secondary | ICD-10-CM | POA: Diagnosis not present

## 2016-04-21 DIAGNOSIS — L570 Actinic keratosis: Secondary | ICD-10-CM | POA: Diagnosis not present

## 2016-05-13 DIAGNOSIS — Z125 Encounter for screening for malignant neoplasm of prostate: Secondary | ICD-10-CM | POA: Diagnosis not present

## 2016-05-13 DIAGNOSIS — Z79899 Other long term (current) drug therapy: Secondary | ICD-10-CM | POA: Diagnosis not present

## 2016-05-13 DIAGNOSIS — E782 Mixed hyperlipidemia: Secondary | ICD-10-CM | POA: Diagnosis not present

## 2016-05-13 DIAGNOSIS — Z1389 Encounter for screening for other disorder: Secondary | ICD-10-CM | POA: Diagnosis not present

## 2016-05-13 DIAGNOSIS — E669 Obesity, unspecified: Secondary | ICD-10-CM | POA: Diagnosis not present

## 2016-05-13 DIAGNOSIS — Z0000 Encounter for general adult medical examination without abnormal findings: Secondary | ICD-10-CM | POA: Diagnosis not present

## 2016-05-13 DIAGNOSIS — E559 Vitamin D deficiency, unspecified: Secondary | ICD-10-CM | POA: Diagnosis not present

## 2016-05-13 DIAGNOSIS — I129 Hypertensive chronic kidney disease with stage 1 through stage 4 chronic kidney disease, or unspecified chronic kidney disease: Secondary | ICD-10-CM | POA: Diagnosis not present

## 2016-05-13 DIAGNOSIS — D649 Anemia, unspecified: Secondary | ICD-10-CM | POA: Diagnosis not present

## 2016-05-13 DIAGNOSIS — Z9181 History of falling: Secondary | ICD-10-CM | POA: Diagnosis not present

## 2016-05-13 DIAGNOSIS — Z6832 Body mass index (BMI) 32.0-32.9, adult: Secondary | ICD-10-CM | POA: Diagnosis not present

## 2016-05-13 DIAGNOSIS — N182 Chronic kidney disease, stage 2 (mild): Secondary | ICD-10-CM | POA: Diagnosis not present

## 2016-05-13 DIAGNOSIS — Z136 Encounter for screening for cardiovascular disorders: Secondary | ICD-10-CM | POA: Diagnosis not present

## 2016-05-17 DIAGNOSIS — M7061 Trochanteric bursitis, right hip: Secondary | ICD-10-CM | POA: Diagnosis not present

## 2016-05-18 DIAGNOSIS — E7800 Pure hypercholesterolemia, unspecified: Secondary | ICD-10-CM | POA: Diagnosis not present

## 2016-05-18 DIAGNOSIS — G4733 Obstructive sleep apnea (adult) (pediatric): Secondary | ICD-10-CM | POA: Diagnosis not present

## 2016-05-18 DIAGNOSIS — R0683 Snoring: Secondary | ICD-10-CM | POA: Insufficient documentation

## 2016-05-18 DIAGNOSIS — Z87898 Personal history of other specified conditions: Secondary | ICD-10-CM | POA: Diagnosis not present

## 2016-05-18 DIAGNOSIS — N182 Chronic kidney disease, stage 2 (mild): Secondary | ICD-10-CM | POA: Diagnosis not present

## 2016-05-18 DIAGNOSIS — I129 Hypertensive chronic kidney disease with stage 1 through stage 4 chronic kidney disease, or unspecified chronic kidney disease: Secondary | ICD-10-CM | POA: Diagnosis not present

## 2016-05-18 DIAGNOSIS — E669 Obesity, unspecified: Secondary | ICD-10-CM | POA: Diagnosis not present

## 2016-05-18 DIAGNOSIS — E1122 Type 2 diabetes mellitus with diabetic chronic kidney disease: Secondary | ICD-10-CM | POA: Diagnosis not present

## 2016-05-18 DIAGNOSIS — Z79899 Other long term (current) drug therapy: Secondary | ICD-10-CM | POA: Diagnosis not present

## 2016-05-18 DIAGNOSIS — N400 Enlarged prostate without lower urinary tract symptoms: Secondary | ICD-10-CM | POA: Diagnosis not present

## 2016-05-18 DIAGNOSIS — D649 Anemia, unspecified: Secondary | ICD-10-CM | POA: Diagnosis not present

## 2016-05-18 DIAGNOSIS — E559 Vitamin D deficiency, unspecified: Secondary | ICD-10-CM | POA: Diagnosis not present

## 2016-05-24 DIAGNOSIS — M7060 Trochanteric bursitis, unspecified hip: Secondary | ICD-10-CM | POA: Diagnosis not present

## 2016-05-24 DIAGNOSIS — M4806 Spinal stenosis, lumbar region: Secondary | ICD-10-CM | POA: Diagnosis not present

## 2016-06-04 DIAGNOSIS — J069 Acute upper respiratory infection, unspecified: Secondary | ICD-10-CM | POA: Diagnosis not present

## 2016-06-14 DIAGNOSIS — M48062 Spinal stenosis, lumbar region with neurogenic claudication: Secondary | ICD-10-CM | POA: Diagnosis not present

## 2016-06-17 DIAGNOSIS — E559 Vitamin D deficiency, unspecified: Secondary | ICD-10-CM | POA: Diagnosis not present

## 2016-06-17 DIAGNOSIS — D649 Anemia, unspecified: Secondary | ICD-10-CM | POA: Diagnosis not present

## 2016-06-20 DIAGNOSIS — Z23 Encounter for immunization: Secondary | ICD-10-CM | POA: Diagnosis not present

## 2016-06-29 DIAGNOSIS — G4733 Obstructive sleep apnea (adult) (pediatric): Secondary | ICD-10-CM | POA: Diagnosis not present

## 2016-07-20 DIAGNOSIS — G4733 Obstructive sleep apnea (adult) (pediatric): Secondary | ICD-10-CM | POA: Diagnosis not present

## 2016-08-16 DIAGNOSIS — G5601 Carpal tunnel syndrome, right upper limb: Secondary | ICD-10-CM | POA: Diagnosis not present

## 2016-08-16 DIAGNOSIS — M48062 Spinal stenosis, lumbar region with neurogenic claudication: Secondary | ICD-10-CM | POA: Diagnosis not present

## 2016-08-16 DIAGNOSIS — G5603 Carpal tunnel syndrome, bilateral upper limbs: Secondary | ICD-10-CM | POA: Diagnosis not present

## 2016-09-12 DIAGNOSIS — E7800 Pure hypercholesterolemia, unspecified: Secondary | ICD-10-CM | POA: Diagnosis not present

## 2016-09-12 DIAGNOSIS — N400 Enlarged prostate without lower urinary tract symptoms: Secondary | ICD-10-CM | POA: Diagnosis not present

## 2016-09-12 DIAGNOSIS — G5601 Carpal tunnel syndrome, right upper limb: Secondary | ICD-10-CM | POA: Diagnosis not present

## 2016-09-12 DIAGNOSIS — Z87891 Personal history of nicotine dependence: Secondary | ICD-10-CM | POA: Diagnosis not present

## 2016-09-12 DIAGNOSIS — E1122 Type 2 diabetes mellitus with diabetic chronic kidney disease: Secondary | ICD-10-CM | POA: Diagnosis not present

## 2016-09-12 DIAGNOSIS — J302 Other seasonal allergic rhinitis: Secondary | ICD-10-CM | POA: Diagnosis not present

## 2016-09-12 DIAGNOSIS — N182 Chronic kidney disease, stage 2 (mild): Secondary | ICD-10-CM | POA: Diagnosis not present

## 2016-09-12 DIAGNOSIS — I129 Hypertensive chronic kidney disease with stage 1 through stage 4 chronic kidney disease, or unspecified chronic kidney disease: Secondary | ICD-10-CM | POA: Diagnosis not present

## 2016-09-12 DIAGNOSIS — Z79899 Other long term (current) drug therapy: Secondary | ICD-10-CM | POA: Diagnosis not present

## 2016-09-12 DIAGNOSIS — Z7982 Long term (current) use of aspirin: Secondary | ICD-10-CM | POA: Diagnosis not present

## 2016-09-12 DIAGNOSIS — E559 Vitamin D deficiency, unspecified: Secondary | ICD-10-CM | POA: Diagnosis not present

## 2016-09-23 DIAGNOSIS — Z95820 Peripheral vascular angioplasty status with implants and grafts: Secondary | ICD-10-CM | POA: Diagnosis not present

## 2016-09-23 DIAGNOSIS — I6523 Occlusion and stenosis of bilateral carotid arteries: Secondary | ICD-10-CM | POA: Diagnosis not present

## 2016-10-03 DIAGNOSIS — J111 Influenza due to unidentified influenza virus with other respiratory manifestations: Secondary | ICD-10-CM | POA: Diagnosis not present

## 2016-10-14 DIAGNOSIS — I6523 Occlusion and stenosis of bilateral carotid arteries: Secondary | ICD-10-CM | POA: Diagnosis not present

## 2016-10-26 DIAGNOSIS — G4733 Obstructive sleep apnea (adult) (pediatric): Secondary | ICD-10-CM | POA: Diagnosis not present

## 2016-11-15 DIAGNOSIS — E1122 Type 2 diabetes mellitus with diabetic chronic kidney disease: Secondary | ICD-10-CM | POA: Diagnosis not present

## 2016-11-15 DIAGNOSIS — N400 Enlarged prostate without lower urinary tract symptoms: Secondary | ICD-10-CM | POA: Diagnosis not present

## 2016-11-15 DIAGNOSIS — I129 Hypertensive chronic kidney disease with stage 1 through stage 4 chronic kidney disease, or unspecified chronic kidney disease: Secondary | ICD-10-CM | POA: Diagnosis not present

## 2016-11-15 DIAGNOSIS — N182 Chronic kidney disease, stage 2 (mild): Secondary | ICD-10-CM | POA: Diagnosis not present

## 2016-11-15 DIAGNOSIS — Z79899 Other long term (current) drug therapy: Secondary | ICD-10-CM | POA: Diagnosis not present

## 2016-11-15 DIAGNOSIS — E559 Vitamin D deficiency, unspecified: Secondary | ICD-10-CM | POA: Diagnosis not present

## 2016-11-15 DIAGNOSIS — E7800 Pure hypercholesterolemia, unspecified: Secondary | ICD-10-CM | POA: Diagnosis not present

## 2016-11-28 DIAGNOSIS — R1310 Dysphagia, unspecified: Secondary | ICD-10-CM | POA: Diagnosis not present

## 2016-11-28 DIAGNOSIS — Z0389 Encounter for observation for other suspected diseases and conditions ruled out: Secondary | ICD-10-CM | POA: Diagnosis not present

## 2016-11-28 DIAGNOSIS — R070 Pain in throat: Secondary | ICD-10-CM | POA: Diagnosis not present

## 2016-11-28 DIAGNOSIS — I10 Essential (primary) hypertension: Secondary | ICD-10-CM | POA: Diagnosis not present

## 2017-01-13 DIAGNOSIS — J309 Allergic rhinitis, unspecified: Secondary | ICD-10-CM | POA: Diagnosis not present

## 2017-01-13 DIAGNOSIS — J069 Acute upper respiratory infection, unspecified: Secondary | ICD-10-CM | POA: Diagnosis not present

## 2017-01-17 DIAGNOSIS — G5603 Carpal tunnel syndrome, bilateral upper limbs: Secondary | ICD-10-CM | POA: Diagnosis not present

## 2017-01-23 DIAGNOSIS — I129 Hypertensive chronic kidney disease with stage 1 through stage 4 chronic kidney disease, or unspecified chronic kidney disease: Secondary | ICD-10-CM | POA: Diagnosis not present

## 2017-01-23 DIAGNOSIS — Z87891 Personal history of nicotine dependence: Secondary | ICD-10-CM | POA: Diagnosis not present

## 2017-01-23 DIAGNOSIS — G5602 Carpal tunnel syndrome, left upper limb: Secondary | ICD-10-CM | POA: Diagnosis not present

## 2017-01-23 DIAGNOSIS — E1122 Type 2 diabetes mellitus with diabetic chronic kidney disease: Secondary | ICD-10-CM | POA: Diagnosis not present

## 2017-01-23 DIAGNOSIS — J302 Other seasonal allergic rhinitis: Secondary | ICD-10-CM | POA: Diagnosis not present

## 2017-01-23 DIAGNOSIS — N182 Chronic kidney disease, stage 2 (mild): Secondary | ICD-10-CM | POA: Diagnosis not present

## 2017-01-23 DIAGNOSIS — Z79899 Other long term (current) drug therapy: Secondary | ICD-10-CM | POA: Diagnosis not present

## 2017-01-23 DIAGNOSIS — N400 Enlarged prostate without lower urinary tract symptoms: Secondary | ICD-10-CM | POA: Diagnosis not present

## 2017-01-23 DIAGNOSIS — Z7984 Long term (current) use of oral hypoglycemic drugs: Secondary | ICD-10-CM | POA: Diagnosis not present

## 2017-02-22 DIAGNOSIS — I129 Hypertensive chronic kidney disease with stage 1 through stage 4 chronic kidney disease, or unspecified chronic kidney disease: Secondary | ICD-10-CM | POA: Diagnosis not present

## 2017-02-22 DIAGNOSIS — N182 Chronic kidney disease, stage 2 (mild): Secondary | ICD-10-CM | POA: Diagnosis not present

## 2017-02-22 DIAGNOSIS — E7800 Pure hypercholesterolemia, unspecified: Secondary | ICD-10-CM | POA: Diagnosis not present

## 2017-02-22 DIAGNOSIS — E559 Vitamin D deficiency, unspecified: Secondary | ICD-10-CM | POA: Diagnosis not present

## 2017-02-22 DIAGNOSIS — Z79899 Other long term (current) drug therapy: Secondary | ICD-10-CM | POA: Diagnosis not present

## 2017-02-22 DIAGNOSIS — E669 Obesity, unspecified: Secondary | ICD-10-CM | POA: Diagnosis not present

## 2017-02-22 DIAGNOSIS — E1122 Type 2 diabetes mellitus with diabetic chronic kidney disease: Secondary | ICD-10-CM | POA: Diagnosis not present

## 2017-02-22 DIAGNOSIS — N400 Enlarged prostate without lower urinary tract symptoms: Secondary | ICD-10-CM | POA: Diagnosis not present

## 2017-03-21 DIAGNOSIS — M1712 Unilateral primary osteoarthritis, left knee: Secondary | ICD-10-CM | POA: Diagnosis not present

## 2017-04-20 DIAGNOSIS — R233 Spontaneous ecchymoses: Secondary | ICD-10-CM | POA: Diagnosis not present

## 2017-04-20 DIAGNOSIS — L853 Xerosis cutis: Secondary | ICD-10-CM | POA: Diagnosis not present

## 2017-04-20 DIAGNOSIS — L570 Actinic keratosis: Secondary | ICD-10-CM | POA: Diagnosis not present

## 2017-05-03 DIAGNOSIS — M1712 Unilateral primary osteoarthritis, left knee: Secondary | ICD-10-CM | POA: Diagnosis not present

## 2017-05-10 DIAGNOSIS — M6281 Muscle weakness (generalized): Secondary | ICD-10-CM | POA: Diagnosis not present

## 2017-05-10 DIAGNOSIS — R2689 Other abnormalities of gait and mobility: Secondary | ICD-10-CM | POA: Diagnosis not present

## 2017-05-10 DIAGNOSIS — M1712 Unilateral primary osteoarthritis, left knee: Secondary | ICD-10-CM | POA: Diagnosis not present

## 2017-05-10 DIAGNOSIS — M25562 Pain in left knee: Secondary | ICD-10-CM | POA: Diagnosis not present

## 2017-05-15 DIAGNOSIS — M1712 Unilateral primary osteoarthritis, left knee: Secondary | ICD-10-CM | POA: Diagnosis not present

## 2017-05-15 DIAGNOSIS — R2689 Other abnormalities of gait and mobility: Secondary | ICD-10-CM | POA: Diagnosis not present

## 2017-05-15 DIAGNOSIS — M6281 Muscle weakness (generalized): Secondary | ICD-10-CM | POA: Diagnosis not present

## 2017-05-15 DIAGNOSIS — M25562 Pain in left knee: Secondary | ICD-10-CM | POA: Diagnosis not present

## 2017-05-19 DIAGNOSIS — R2689 Other abnormalities of gait and mobility: Secondary | ICD-10-CM | POA: Diagnosis not present

## 2017-05-19 DIAGNOSIS — M25562 Pain in left knee: Secondary | ICD-10-CM | POA: Diagnosis not present

## 2017-05-19 DIAGNOSIS — M1712 Unilateral primary osteoarthritis, left knee: Secondary | ICD-10-CM | POA: Diagnosis not present

## 2017-05-19 DIAGNOSIS — M6281 Muscle weakness (generalized): Secondary | ICD-10-CM | POA: Diagnosis not present

## 2017-05-22 DIAGNOSIS — R2689 Other abnormalities of gait and mobility: Secondary | ICD-10-CM | POA: Diagnosis not present

## 2017-05-22 DIAGNOSIS — M6281 Muscle weakness (generalized): Secondary | ICD-10-CM | POA: Diagnosis not present

## 2017-05-22 DIAGNOSIS — M25562 Pain in left knee: Secondary | ICD-10-CM | POA: Diagnosis not present

## 2017-05-22 DIAGNOSIS — M1712 Unilateral primary osteoarthritis, left knee: Secondary | ICD-10-CM | POA: Diagnosis not present

## 2017-05-24 DIAGNOSIS — R2689 Other abnormalities of gait and mobility: Secondary | ICD-10-CM | POA: Diagnosis not present

## 2017-05-24 DIAGNOSIS — M25562 Pain in left knee: Secondary | ICD-10-CM | POA: Diagnosis not present

## 2017-05-24 DIAGNOSIS — M1712 Unilateral primary osteoarthritis, left knee: Secondary | ICD-10-CM | POA: Diagnosis not present

## 2017-05-24 DIAGNOSIS — M6281 Muscle weakness (generalized): Secondary | ICD-10-CM | POA: Diagnosis not present

## 2017-05-26 DIAGNOSIS — E7800 Pure hypercholesterolemia, unspecified: Secondary | ICD-10-CM | POA: Diagnosis not present

## 2017-05-26 DIAGNOSIS — E559 Vitamin D deficiency, unspecified: Secondary | ICD-10-CM | POA: Diagnosis not present

## 2017-05-26 DIAGNOSIS — I129 Hypertensive chronic kidney disease with stage 1 through stage 4 chronic kidney disease, or unspecified chronic kidney disease: Secondary | ICD-10-CM | POA: Diagnosis not present

## 2017-05-26 DIAGNOSIS — N400 Enlarged prostate without lower urinary tract symptoms: Secondary | ICD-10-CM | POA: Diagnosis not present

## 2017-05-26 DIAGNOSIS — E669 Obesity, unspecified: Secondary | ICD-10-CM | POA: Diagnosis not present

## 2017-05-26 DIAGNOSIS — R7989 Other specified abnormal findings of blood chemistry: Secondary | ICD-10-CM | POA: Diagnosis not present

## 2017-05-26 DIAGNOSIS — N182 Chronic kidney disease, stage 2 (mild): Secondary | ICD-10-CM | POA: Diagnosis not present

## 2017-05-26 DIAGNOSIS — Z79899 Other long term (current) drug therapy: Secondary | ICD-10-CM | POA: Diagnosis not present

## 2017-05-26 DIAGNOSIS — E1122 Type 2 diabetes mellitus with diabetic chronic kidney disease: Secondary | ICD-10-CM | POA: Diagnosis not present

## 2017-05-26 DIAGNOSIS — D649 Anemia, unspecified: Secondary | ICD-10-CM | POA: Diagnosis not present

## 2017-05-31 DIAGNOSIS — R2689 Other abnormalities of gait and mobility: Secondary | ICD-10-CM | POA: Diagnosis not present

## 2017-05-31 DIAGNOSIS — D649 Anemia, unspecified: Secondary | ICD-10-CM | POA: Diagnosis not present

## 2017-05-31 DIAGNOSIS — M25562 Pain in left knee: Secondary | ICD-10-CM | POA: Diagnosis not present

## 2017-05-31 DIAGNOSIS — M6281 Muscle weakness (generalized): Secondary | ICD-10-CM | POA: Diagnosis not present

## 2017-05-31 DIAGNOSIS — M1712 Unilateral primary osteoarthritis, left knee: Secondary | ICD-10-CM | POA: Diagnosis not present

## 2017-06-05 DIAGNOSIS — M25562 Pain in left knee: Secondary | ICD-10-CM | POA: Diagnosis not present

## 2017-06-05 DIAGNOSIS — M1712 Unilateral primary osteoarthritis, left knee: Secondary | ICD-10-CM | POA: Diagnosis not present

## 2017-06-05 DIAGNOSIS — R2689 Other abnormalities of gait and mobility: Secondary | ICD-10-CM | POA: Diagnosis not present

## 2017-06-05 DIAGNOSIS — M6281 Muscle weakness (generalized): Secondary | ICD-10-CM | POA: Diagnosis not present

## 2017-06-08 DIAGNOSIS — I129 Hypertensive chronic kidney disease with stage 1 through stage 4 chronic kidney disease, or unspecified chronic kidney disease: Secondary | ICD-10-CM | POA: Diagnosis not present

## 2017-06-08 DIAGNOSIS — Z0000 Encounter for general adult medical examination without abnormal findings: Secondary | ICD-10-CM | POA: Diagnosis not present

## 2017-06-08 DIAGNOSIS — Z1389 Encounter for screening for other disorder: Secondary | ICD-10-CM | POA: Diagnosis not present

## 2017-06-08 DIAGNOSIS — Z125 Encounter for screening for malignant neoplasm of prostate: Secondary | ICD-10-CM | POA: Diagnosis not present

## 2017-06-08 DIAGNOSIS — Z6831 Body mass index (BMI) 31.0-31.9, adult: Secondary | ICD-10-CM | POA: Diagnosis not present

## 2017-06-08 DIAGNOSIS — Z136 Encounter for screening for cardiovascular disorders: Secondary | ICD-10-CM | POA: Diagnosis not present

## 2017-06-08 DIAGNOSIS — E785 Hyperlipidemia, unspecified: Secondary | ICD-10-CM | POA: Diagnosis not present

## 2017-06-08 DIAGNOSIS — E669 Obesity, unspecified: Secondary | ICD-10-CM | POA: Diagnosis not present

## 2017-06-08 DIAGNOSIS — Z23 Encounter for immunization: Secondary | ICD-10-CM | POA: Diagnosis not present

## 2017-06-09 DIAGNOSIS — M25562 Pain in left knee: Secondary | ICD-10-CM | POA: Diagnosis not present

## 2017-06-09 DIAGNOSIS — R2689 Other abnormalities of gait and mobility: Secondary | ICD-10-CM | POA: Diagnosis not present

## 2017-06-09 DIAGNOSIS — M1712 Unilateral primary osteoarthritis, left knee: Secondary | ICD-10-CM | POA: Diagnosis not present

## 2017-06-09 DIAGNOSIS — M6281 Muscle weakness (generalized): Secondary | ICD-10-CM | POA: Diagnosis not present

## 2017-06-12 DIAGNOSIS — M1712 Unilateral primary osteoarthritis, left knee: Secondary | ICD-10-CM | POA: Diagnosis not present

## 2017-06-12 DIAGNOSIS — M6281 Muscle weakness (generalized): Secondary | ICD-10-CM | POA: Diagnosis not present

## 2017-06-12 DIAGNOSIS — R2689 Other abnormalities of gait and mobility: Secondary | ICD-10-CM | POA: Diagnosis not present

## 2017-06-12 DIAGNOSIS — M25562 Pain in left knee: Secondary | ICD-10-CM | POA: Diagnosis not present

## 2017-06-14 DIAGNOSIS — M1712 Unilateral primary osteoarthritis, left knee: Secondary | ICD-10-CM | POA: Diagnosis not present

## 2017-06-14 DIAGNOSIS — D649 Anemia, unspecified: Secondary | ICD-10-CM | POA: Diagnosis not present

## 2017-06-15 DIAGNOSIS — R2689 Other abnormalities of gait and mobility: Secondary | ICD-10-CM | POA: Diagnosis not present

## 2017-06-15 DIAGNOSIS — M6281 Muscle weakness (generalized): Secondary | ICD-10-CM | POA: Diagnosis not present

## 2017-06-15 DIAGNOSIS — M1712 Unilateral primary osteoarthritis, left knee: Secondary | ICD-10-CM | POA: Diagnosis not present

## 2017-06-15 DIAGNOSIS — M25562 Pain in left knee: Secondary | ICD-10-CM | POA: Diagnosis not present

## 2017-07-24 DIAGNOSIS — D649 Anemia, unspecified: Secondary | ICD-10-CM | POA: Diagnosis not present

## 2017-08-03 DIAGNOSIS — E1122 Type 2 diabetes mellitus with diabetic chronic kidney disease: Secondary | ICD-10-CM | POA: Diagnosis not present

## 2017-08-03 DIAGNOSIS — E785 Hyperlipidemia, unspecified: Secondary | ICD-10-CM | POA: Diagnosis not present

## 2017-08-03 DIAGNOSIS — E559 Vitamin D deficiency, unspecified: Secondary | ICD-10-CM | POA: Diagnosis not present

## 2017-08-03 DIAGNOSIS — I129 Hypertensive chronic kidney disease with stage 1 through stage 4 chronic kidney disease, or unspecified chronic kidney disease: Secondary | ICD-10-CM | POA: Diagnosis not present

## 2017-08-03 DIAGNOSIS — M1990 Unspecified osteoarthritis, unspecified site: Secondary | ICD-10-CM | POA: Diagnosis not present

## 2017-08-03 DIAGNOSIS — K219 Gastro-esophageal reflux disease without esophagitis: Secondary | ICD-10-CM | POA: Diagnosis not present

## 2017-08-03 DIAGNOSIS — Z981 Arthrodesis status: Secondary | ICD-10-CM | POA: Diagnosis not present

## 2017-08-03 DIAGNOSIS — D649 Anemia, unspecified: Secondary | ICD-10-CM | POA: Diagnosis not present

## 2017-08-03 DIAGNOSIS — N400 Enlarged prostate without lower urinary tract symptoms: Secondary | ICD-10-CM | POA: Diagnosis not present

## 2017-08-03 DIAGNOSIS — N182 Chronic kidney disease, stage 2 (mild): Secondary | ICD-10-CM | POA: Diagnosis not present

## 2017-10-04 DIAGNOSIS — E7800 Pure hypercholesterolemia, unspecified: Secondary | ICD-10-CM | POA: Diagnosis not present

## 2017-10-04 DIAGNOSIS — Z79899 Other long term (current) drug therapy: Secondary | ICD-10-CM | POA: Diagnosis not present

## 2017-10-04 DIAGNOSIS — E1122 Type 2 diabetes mellitus with diabetic chronic kidney disease: Secondary | ICD-10-CM | POA: Diagnosis not present

## 2017-10-04 DIAGNOSIS — N182 Chronic kidney disease, stage 2 (mild): Secondary | ICD-10-CM | POA: Diagnosis not present

## 2017-10-04 DIAGNOSIS — R7989 Other specified abnormal findings of blood chemistry: Secondary | ICD-10-CM | POA: Diagnosis not present

## 2017-10-04 DIAGNOSIS — E559 Vitamin D deficiency, unspecified: Secondary | ICD-10-CM | POA: Diagnosis not present

## 2017-10-04 DIAGNOSIS — I129 Hypertensive chronic kidney disease with stage 1 through stage 4 chronic kidney disease, or unspecified chronic kidney disease: Secondary | ICD-10-CM | POA: Diagnosis not present

## 2017-10-04 DIAGNOSIS — N400 Enlarged prostate without lower urinary tract symptoms: Secondary | ICD-10-CM | POA: Diagnosis not present

## 2017-10-17 DIAGNOSIS — I6521 Occlusion and stenosis of right carotid artery: Secondary | ICD-10-CM | POA: Diagnosis not present

## 2017-10-19 DIAGNOSIS — L821 Other seborrheic keratosis: Secondary | ICD-10-CM | POA: Diagnosis not present

## 2017-10-19 DIAGNOSIS — L570 Actinic keratosis: Secondary | ICD-10-CM | POA: Diagnosis not present

## 2017-12-05 DIAGNOSIS — L853 Xerosis cutis: Secondary | ICD-10-CM | POA: Diagnosis not present

## 2017-12-05 DIAGNOSIS — L300 Nummular dermatitis: Secondary | ICD-10-CM | POA: Diagnosis not present

## 2017-12-05 DIAGNOSIS — L299 Pruritus, unspecified: Secondary | ICD-10-CM | POA: Diagnosis not present

## 2018-01-15 DIAGNOSIS — I2111 ST elevation (STEMI) myocardial infarction involving right coronary artery: Secondary | ICD-10-CM

## 2018-01-17 DIAGNOSIS — I129 Hypertensive chronic kidney disease with stage 1 through stage 4 chronic kidney disease, or unspecified chronic kidney disease: Secondary | ICD-10-CM | POA: Diagnosis not present

## 2018-01-17 DIAGNOSIS — E7800 Pure hypercholesterolemia, unspecified: Secondary | ICD-10-CM | POA: Diagnosis not present

## 2018-01-17 DIAGNOSIS — E1122 Type 2 diabetes mellitus with diabetic chronic kidney disease: Secondary | ICD-10-CM | POA: Diagnosis not present

## 2018-01-17 DIAGNOSIS — N400 Enlarged prostate without lower urinary tract symptoms: Secondary | ICD-10-CM | POA: Diagnosis not present

## 2018-01-19 DIAGNOSIS — E7800 Pure hypercholesterolemia, unspecified: Secondary | ICD-10-CM | POA: Diagnosis not present

## 2018-01-22 ENCOUNTER — Inpatient Hospital Stay (HOSPITAL_COMMUNITY)
Admission: RE | Disposition: A | Discharge status: Home Health | Payer: Self-pay | Source: Ambulatory Visit | Attending: Cardiovascular Disease

## 2018-01-22 ENCOUNTER — Inpatient Hospital Stay (HOSPITAL_COMMUNITY): Payer: Medicare Other

## 2018-01-22 ENCOUNTER — Encounter (HOSPITAL_COMMUNITY): Payer: Self-pay | Admitting: Physician Assistant

## 2018-01-22 ENCOUNTER — Inpatient Hospital Stay (HOSPITAL_COMMUNITY)
Admission: RE | Admit: 2018-01-22 | Discharge: 2018-02-01 | DRG: 222 | Disposition: A | Discharge status: Home Health | Payer: Medicare Other | Source: Ambulatory Visit | Attending: Cardiovascular Disease | Admitting: Cardiovascular Disease

## 2018-01-22 DIAGNOSIS — E1122 Type 2 diabetes mellitus with diabetic chronic kidney disease: Secondary | ICD-10-CM | POA: Diagnosis present

## 2018-01-22 DIAGNOSIS — R338 Other retention of urine: Secondary | ICD-10-CM | POA: Diagnosis not present

## 2018-01-22 DIAGNOSIS — N183 Chronic kidney disease, stage 3 (moderate): Secondary | ICD-10-CM | POA: Diagnosis present

## 2018-01-22 DIAGNOSIS — J9600 Acute respiratory failure, unspecified whether with hypoxia or hypercapnia: Secondary | ICD-10-CM | POA: Diagnosis not present

## 2018-01-22 DIAGNOSIS — I2510 Atherosclerotic heart disease of native coronary artery without angina pectoris: Secondary | ICD-10-CM | POA: Diagnosis not present

## 2018-01-22 DIAGNOSIS — E875 Hyperkalemia: Secondary | ICD-10-CM | POA: Diagnosis present

## 2018-01-22 DIAGNOSIS — I2111 ST elevation (STEMI) myocardial infarction involving right coronary artery: Secondary | ICD-10-CM | POA: Diagnosis not present

## 2018-01-22 DIAGNOSIS — D62 Acute posthemorrhagic anemia: Secondary | ICD-10-CM | POA: Diagnosis not present

## 2018-01-22 DIAGNOSIS — I462 Cardiac arrest due to underlying cardiac condition: Secondary | ICD-10-CM | POA: Diagnosis not present

## 2018-01-22 DIAGNOSIS — E785 Hyperlipidemia, unspecified: Secondary | ICD-10-CM | POA: Diagnosis present

## 2018-01-22 DIAGNOSIS — N471 Phimosis: Secondary | ICD-10-CM | POA: Diagnosis not present

## 2018-01-22 DIAGNOSIS — R079 Chest pain, unspecified: Secondary | ICD-10-CM | POA: Diagnosis not present

## 2018-01-22 DIAGNOSIS — J810 Acute pulmonary edema: Secondary | ICD-10-CM | POA: Diagnosis not present

## 2018-01-22 DIAGNOSIS — Z8249 Family history of ischemic heart disease and other diseases of the circulatory system: Secondary | ICD-10-CM

## 2018-01-22 DIAGNOSIS — I130 Hypertensive heart and chronic kidney disease with heart failure and stage 1 through stage 4 chronic kidney disease, or unspecified chronic kidney disease: Secondary | ICD-10-CM | POA: Diagnosis present

## 2018-01-22 DIAGNOSIS — I481 Persistent atrial fibrillation: Secondary | ICD-10-CM | POA: Diagnosis not present

## 2018-01-22 DIAGNOSIS — R0602 Shortness of breath: Secondary | ICD-10-CM | POA: Diagnosis not present

## 2018-01-22 DIAGNOSIS — J9601 Acute respiratory failure with hypoxia: Secondary | ICD-10-CM | POA: Diagnosis not present

## 2018-01-22 DIAGNOSIS — N170 Acute kidney failure with tubular necrosis: Secondary | ICD-10-CM

## 2018-01-22 DIAGNOSIS — N401 Enlarged prostate with lower urinary tract symptoms: Secondary | ICD-10-CM | POA: Diagnosis present

## 2018-01-22 DIAGNOSIS — R140 Abdominal distension (gaseous): Secondary | ICD-10-CM | POA: Diagnosis not present

## 2018-01-22 DIAGNOSIS — N179 Acute kidney failure, unspecified: Secondary | ICD-10-CM

## 2018-01-22 DIAGNOSIS — Z6833 Body mass index (BMI) 33.0-33.9, adult: Secondary | ICD-10-CM

## 2018-01-22 DIAGNOSIS — E876 Hypokalemia: Secondary | ICD-10-CM

## 2018-01-22 DIAGNOSIS — I4892 Unspecified atrial flutter: Secondary | ICD-10-CM | POA: Diagnosis not present

## 2018-01-22 DIAGNOSIS — E669 Obesity, unspecified: Secondary | ICD-10-CM | POA: Diagnosis present

## 2018-01-22 DIAGNOSIS — R197 Diarrhea, unspecified: Secondary | ICD-10-CM | POA: Diagnosis not present

## 2018-01-22 DIAGNOSIS — R0603 Acute respiratory distress: Secondary | ICD-10-CM | POA: Diagnosis not present

## 2018-01-22 DIAGNOSIS — R0902 Hypoxemia: Secondary | ICD-10-CM | POA: Diagnosis not present

## 2018-01-22 DIAGNOSIS — I472 Ventricular tachycardia, unspecified: Secondary | ICD-10-CM

## 2018-01-22 DIAGNOSIS — Z4682 Encounter for fitting and adjustment of non-vascular catheter: Secondary | ICD-10-CM | POA: Diagnosis not present

## 2018-01-22 DIAGNOSIS — I480 Paroxysmal atrial fibrillation: Secondary | ICD-10-CM | POA: Diagnosis not present

## 2018-01-22 DIAGNOSIS — G92 Toxic encephalopathy: Secondary | ICD-10-CM | POA: Diagnosis present

## 2018-01-22 DIAGNOSIS — N390 Urinary tract infection, site not specified: Secondary | ICD-10-CM | POA: Diagnosis not present

## 2018-01-22 DIAGNOSIS — I5021 Acute systolic (congestive) heart failure: Secondary | ICD-10-CM | POA: Diagnosis present

## 2018-01-22 DIAGNOSIS — N189 Chronic kidney disease, unspecified: Secondary | ICD-10-CM

## 2018-01-22 DIAGNOSIS — I340 Nonrheumatic mitral (valve) insufficiency: Secondary | ICD-10-CM | POA: Diagnosis not present

## 2018-01-22 DIAGNOSIS — Z87891 Personal history of nicotine dependence: Secondary | ICD-10-CM

## 2018-01-22 DIAGNOSIS — I2119 ST elevation (STEMI) myocardial infarction involving other coronary artery of inferior wall: Secondary | ICD-10-CM | POA: Diagnosis not present

## 2018-01-22 DIAGNOSIS — I2584 Coronary atherosclerosis due to calcified coronary lesion: Secondary | ICD-10-CM | POA: Diagnosis present

## 2018-01-22 DIAGNOSIS — Z955 Presence of coronary angioplasty implant and graft: Secondary | ICD-10-CM

## 2018-01-22 DIAGNOSIS — Z95810 Presence of automatic (implantable) cardiac defibrillator: Secondary | ICD-10-CM

## 2018-01-22 DIAGNOSIS — E1159 Type 2 diabetes mellitus with other circulatory complications: Secondary | ICD-10-CM | POA: Diagnosis not present

## 2018-01-22 DIAGNOSIS — I255 Ischemic cardiomyopathy: Secondary | ICD-10-CM | POA: Diagnosis present

## 2018-01-22 DIAGNOSIS — Z4659 Encounter for fitting and adjustment of other gastrointestinal appliance and device: Secondary | ICD-10-CM

## 2018-01-22 DIAGNOSIS — I129 Hypertensive chronic kidney disease with stage 1 through stage 4 chronic kidney disease, or unspecified chronic kidney disease: Secondary | ICD-10-CM | POA: Diagnosis not present

## 2018-01-22 DIAGNOSIS — J690 Pneumonitis due to inhalation of food and vomit: Secondary | ICD-10-CM | POA: Diagnosis present

## 2018-01-22 DIAGNOSIS — J9811 Atelectasis: Secondary | ICD-10-CM | POA: Diagnosis not present

## 2018-01-22 DIAGNOSIS — G4733 Obstructive sleep apnea (adult) (pediatric): Secondary | ICD-10-CM | POA: Diagnosis present

## 2018-01-22 DIAGNOSIS — R3914 Feeling of incomplete bladder emptying: Secondary | ICD-10-CM | POA: Diagnosis not present

## 2018-01-22 DIAGNOSIS — R109 Unspecified abdominal pain: Secondary | ICD-10-CM

## 2018-01-22 HISTORY — DX: Cardiac arrest, cause unspecified: I46.9

## 2018-01-22 HISTORY — PX: CORONARY/GRAFT ACUTE MI REVASCULARIZATION: CATH118305

## 2018-01-22 HISTORY — PX: CORONARY STENT INTERVENTION: CATH118234

## 2018-01-22 HISTORY — PX: LEFT HEART CATH AND CORONARY ANGIOGRAPHY: CATH118249

## 2018-01-22 HISTORY — DX: Type 2 diabetes mellitus without complications: E11.9

## 2018-01-22 HISTORY — DX: Hyperlipidemia, unspecified: E78.5

## 2018-01-22 HISTORY — DX: Ventricular tachycardia: I47.2

## 2018-01-22 HISTORY — DX: ST elevation (STEMI) myocardial infarction of unspecified site: I21.3

## 2018-01-22 HISTORY — DX: Ventricular tachycardia, unspecified: I47.20

## 2018-01-22 HISTORY — DX: Acute respiratory failure, unspecified whether with hypoxia or hypercapnia: J96.00

## 2018-01-22 HISTORY — DX: Essential (primary) hypertension: I10

## 2018-01-22 HISTORY — DX: Unspecified systolic (congestive) heart failure: I50.20

## 2018-01-22 LAB — PROTIME-INR
INR: 1.19
PROTHROMBIN TIME: 15 s (ref 11.4–15.2)

## 2018-01-22 LAB — POCT I-STAT 3, ART BLOOD GAS (G3+)
Acid-base deficit: 5 mmol/L — ABNORMAL HIGH (ref 0.0–2.0)
Bicarbonate: 21 mmol/L (ref 20.0–28.0)
O2 Saturation: 85 %
PH ART: 7.33 — AB (ref 7.350–7.450)
TCO2: 22 mmol/L (ref 22–32)
pCO2 arterial: 39.8 mmHg (ref 32.0–48.0)
pO2, Arterial: 53 mmHg — ABNORMAL LOW (ref 83.0–108.0)

## 2018-01-22 LAB — CBC
HCT: 35.1 % — ABNORMAL LOW (ref 39.0–52.0)
Hemoglobin: 11.4 g/dL — ABNORMAL LOW (ref 13.0–17.0)
MCH: 31.1 pg (ref 26.0–34.0)
MCHC: 32.5 g/dL (ref 30.0–36.0)
MCV: 95.9 fL (ref 78.0–100.0)
PLATELETS: 227 10*3/uL (ref 150–400)
RBC: 3.66 MIL/uL — AB (ref 4.22–5.81)
RDW: 13.9 % (ref 11.5–15.5)
WBC: 11.8 10*3/uL — ABNORMAL HIGH (ref 4.0–10.5)

## 2018-01-22 LAB — POCT I-STAT, CHEM 8
BUN: 29 mg/dL — ABNORMAL HIGH (ref 6–20)
CALCIUM ION: 1.23 mmol/L (ref 1.15–1.40)
CHLORIDE: 101 mmol/L (ref 101–111)
Creatinine, Ser: 1.2 mg/dL (ref 0.61–1.24)
Glucose, Bld: 172 mg/dL — ABNORMAL HIGH (ref 65–99)
HCT: 36 % — ABNORMAL LOW (ref 39.0–52.0)
HEMOGLOBIN: 12.2 g/dL — AB (ref 13.0–17.0)
Potassium: 4.3 mmol/L (ref 3.5–5.1)
SODIUM: 135 mmol/L (ref 135–145)
TCO2: 22 mmol/L (ref 22–32)

## 2018-01-22 LAB — HEMOGLOBIN A1C
HEMOGLOBIN A1C: 6.6 % — AB (ref 4.8–5.6)
Mean Plasma Glucose: 142.72 mg/dL

## 2018-01-22 LAB — COMPREHENSIVE METABOLIC PANEL
ALBUMIN: 3 g/dL — AB (ref 3.5–5.0)
ALK PHOS: 65 U/L (ref 38–126)
ALT: 24 U/L (ref 17–63)
ANION GAP: 13 (ref 5–15)
AST: 22 U/L (ref 15–41)
BUN: 28 mg/dL — ABNORMAL HIGH (ref 6–20)
CALCIUM: 8.9 mg/dL (ref 8.9–10.3)
CO2: 21 mmol/L — AB (ref 22–32)
Chloride: 101 mmol/L (ref 101–111)
Creatinine, Ser: 1.39 mg/dL — ABNORMAL HIGH (ref 0.61–1.24)
GFR calc non Af Amer: 46 mL/min — ABNORMAL LOW (ref >60)
GFR, EST AFRICAN AMERICAN: 53 mL/min — AB (ref >60)
GLUCOSE: 163 mg/dL — AB (ref 65–99)
Potassium: 4.4 mmol/L (ref 3.5–5.1)
SODIUM: 135 mmol/L (ref 135–145)
Total Bilirubin: 1.2 mg/dL (ref 0.3–1.2)
Total Protein: 6.9 g/dL (ref 6.5–8.1)

## 2018-01-22 LAB — GLUCOSE, CAPILLARY
Glucose-Capillary: 172 mg/dL — ABNORMAL HIGH (ref 65–99)
Glucose-Capillary: 187 mg/dL — ABNORMAL HIGH (ref 65–99)
Glucose-Capillary: 188 mg/dL — ABNORMAL HIGH (ref 65–99)

## 2018-01-22 LAB — LIPID PANEL
Cholesterol: 126 mg/dL (ref 0–200)
HDL: 43 mg/dL (ref >40)
LDL Cholesterol: 68 mg/dL (ref 0–99)
TRIGLYCERIDES: 75 mg/dL (ref <150)
Total CHOL/HDL Ratio: 2.9 RATIO
VLDL: 15 mg/dL (ref 0–40)

## 2018-01-22 LAB — MRSA PCR SCREENING: MRSA BY PCR: NEGATIVE

## 2018-01-22 LAB — POCT ACTIVATED CLOTTING TIME: Activated Clotting Time: 345 seconds

## 2018-01-22 LAB — BRAIN NATRIURETIC PEPTIDE: B Natriuretic Peptide: 695.6 pg/mL — ABNORMAL HIGH (ref 0.0–100.0)

## 2018-01-22 LAB — APTT: aPTT: 33 seconds (ref 24–36)

## 2018-01-22 LAB — TROPONIN I
TROPONIN I: 2.75 ng/mL — AB (ref <0.03)
Troponin I: 1.8 ng/mL (ref <0.03)

## 2018-01-22 SURGERY — LEFT HEART CATH AND CORONARY ANGIOGRAPHY
Anesthesia: LOCAL

## 2018-01-22 MED ORDER — BIVALIRUDIN BOLUS VIA INFUSION - CUPID
INTRAVENOUS | Status: DC | PRN
  Administered 2018-01-22: 74.163 mg via INTRAVENOUS

## 2018-01-22 MED ORDER — HEPARIN SODIUM (PORCINE) 1000 UNIT/ML IJ SOLN
INTRAMUSCULAR | Status: AC
  Filled 2018-01-22: qty 1

## 2018-01-22 MED ORDER — NITROGLYCERIN 1 MG/10 ML FOR IR/CATH LAB
INTRA_ARTERIAL | Status: AC
  Filled 2018-01-22: qty 10

## 2018-01-22 MED ORDER — TICAGRELOR 90 MG PO TABS
90.0000 mg | ORAL_TABLET | Freq: Two times a day (BID) | ORAL | Status: DC
  Administered 2018-01-22 – 2018-02-01 (×20): 90 mg via ORAL
  Filled 2018-01-22 (×21): qty 1

## 2018-01-22 MED ORDER — TICAGRELOR 90 MG PO TABS
ORAL_TABLET | ORAL | Status: DC | PRN
  Administered 2018-01-22: 180 mg via ORAL

## 2018-01-22 MED ORDER — ACETAMINOPHEN 325 MG PO TABS: 650.0000 mg | ORAL_TABLET | ORAL | Status: DC | PRN

## 2018-01-22 MED ORDER — ATORVASTATIN CALCIUM 80 MG PO TABS
80.0000 mg | ORAL_TABLET | Freq: Every day | ORAL | Status: DC
  Administered 2018-01-23 – 2018-01-28 (×6): 80 mg via ORAL
  Filled 2018-01-22 (×6): qty 1

## 2018-01-22 MED ORDER — TIROFIBAN (AGGRASTAT) BOLUS VIA INFUSION
INTRAVENOUS | Status: DC | PRN
  Administered 2018-01-22: 2472.1 ug via INTRAVENOUS

## 2018-01-22 MED ORDER — TIROFIBAN HCL IN NACL 5-0.9 MG/100ML-% IV SOLN
INTRAVENOUS | Status: AC | PRN
  Administered 2018-01-22: 0.15 ug/kg/min via INTRAVENOUS

## 2018-01-22 MED ORDER — VERAPAMIL HCL 2.5 MG/ML IV SOLN
INTRAVENOUS | Status: DC | PRN
  Administered 2018-01-22: 10 mL via INTRA_ARTERIAL

## 2018-01-22 MED ORDER — TIROFIBAN HCL IV 12.5 MG/250 ML
0.0750 ug/kg/min | INTRAVENOUS | Status: AC
  Administered 2018-01-22: 0.075 ug/kg/min via INTRAVENOUS
  Filled 2018-01-22 (×2): qty 250

## 2018-01-22 MED ORDER — LABETALOL HCL 5 MG/ML IV SOLN: 10.0000 mg | INTRAVENOUS | Status: AC | PRN

## 2018-01-22 MED ORDER — ONDANSETRON HCL 4 MG/2ML IJ SOLN
INTRAMUSCULAR | Status: DC | PRN
  Administered 2018-01-22: 4 mg via INTRAVENOUS

## 2018-01-22 MED ORDER — ATORVASTATIN CALCIUM 80 MG PO TABS: 80.0000 mg | ORAL_TABLET | Freq: Every day | ORAL | Status: DC

## 2018-01-22 MED ORDER — ASPIRIN EC 81 MG PO TBEC
81.0000 mg | DELAYED_RELEASE_TABLET | Freq: Every day | ORAL | Status: DC
  Administered 2018-01-23 – 2018-02-01 (×10): 81 mg via ORAL
  Filled 2018-01-22 (×10): qty 1

## 2018-01-22 MED ORDER — FUROSEMIDE 10 MG/ML IJ SOLN
INTRAMUSCULAR | Status: DC | PRN
  Administered 2018-01-22: 40 mg via INTRAVENOUS

## 2018-01-22 MED ORDER — SODIUM CHLORIDE 0.9 % IV SOLN: INTRAVENOUS | Status: DC

## 2018-01-22 MED ORDER — SODIUM CHLORIDE 0.9% FLUSH
3.0000 mL | Freq: Two times a day (BID) | INTRAVENOUS | Status: DC
  Administered 2018-01-23 – 2018-01-24 (×2): 3 mL via INTRAVENOUS

## 2018-01-22 MED ORDER — LIDOCAINE HCL (PF) 1 % IJ SOLN
INTRAMUSCULAR | Status: AC
  Filled 2018-01-22: qty 30

## 2018-01-22 MED ORDER — ONDANSETRON HCL 4 MG/2ML IJ SOLN
INTRAMUSCULAR | Status: AC
  Filled 2018-01-22: qty 2

## 2018-01-22 MED ORDER — SODIUM CHLORIDE 0.9% FLUSH: 3.0000 mL | INTRAVENOUS | Status: DC | PRN

## 2018-01-22 MED ORDER — ASPIRIN 81 MG PO CHEW: 81.0000 mg | CHEWABLE_TABLET | Freq: Every day | ORAL | Status: DC

## 2018-01-22 MED ORDER — TICAGRELOR 90 MG PO TABS
ORAL_TABLET | ORAL | Status: AC
  Filled 2018-01-22: qty 2

## 2018-01-22 MED ORDER — VERAPAMIL HCL 2.5 MG/ML IV SOLN
INTRAVENOUS | Status: AC
  Filled 2018-01-22: qty 2

## 2018-01-22 MED ORDER — HYDRALAZINE HCL 20 MG/ML IJ SOLN: 5.0000 mg | INTRAMUSCULAR | Status: AC | PRN

## 2018-01-22 MED ORDER — ONDANSETRON HCL 4 MG/2ML IJ SOLN: 4.0000 mg | Freq: Four times a day (QID) | INTRAMUSCULAR | Status: DC | PRN

## 2018-01-22 MED ORDER — SODIUM CHLORIDE 0.9 % IV SOLN
INTRAVENOUS | Status: AC | PRN
  Administered 2018-01-22: 1.75 mg/kg/h
  Administered 2018-01-22: 1.75 mg/kg/h via INTRAVENOUS

## 2018-01-22 MED ORDER — BIVALIRUDIN TRIFLUOROACETATE 250 MG IV SOLR: 1.7500 mg/kg/h | INTRAVENOUS | Status: AC

## 2018-01-22 MED ORDER — SODIUM CHLORIDE 0.9 % IV SOLN: 250.0000 mL | INTRAVENOUS | Status: DC | PRN

## 2018-01-22 MED ORDER — IOHEXOL 350 MG/ML SOLN
INTRAVENOUS | Status: DC | PRN
  Administered 2018-01-22: 220 mL

## 2018-01-22 MED ORDER — BIVALIRUDIN TRIFLUOROACETATE 250 MG IV SOLR
INTRAVENOUS | Status: AC
  Filled 2018-01-22: qty 250

## 2018-01-22 MED ORDER — ISOSORBIDE MONONITRATE ER 30 MG PO TB24
30.0000 mg | ORAL_TABLET | Freq: Every day | ORAL | Status: DC
  Administered 2018-01-23 – 2018-01-28 (×6): 30 mg via ORAL
  Filled 2018-01-22 (×6): qty 1

## 2018-01-22 MED ORDER — FUROSEMIDE 10 MG/ML IJ SOLN
INTRAMUSCULAR | Status: AC
  Filled 2018-01-22: qty 4

## 2018-01-22 MED ORDER — NITROGLYCERIN 1 MG/10 ML FOR IR/CATH LAB
INTRA_ARTERIAL | Status: DC | PRN
  Administered 2018-01-22: 200 ug via INTRACORONARY

## 2018-01-22 MED ORDER — TIROFIBAN HCL IN NACL 5-0.9 MG/100ML-% IV SOLN
INTRAVENOUS | Status: AC
  Filled 2018-01-22: qty 100

## 2018-01-22 MED ORDER — TICAGRELOR 90 MG PO TABS: 90.0000 mg | ORAL_TABLET | Freq: Two times a day (BID) | ORAL | Status: DC

## 2018-01-22 MED ORDER — HEPARIN (PORCINE) IN NACL 1000-0.9 UT/500ML-% IV SOLN
INTRAVENOUS | Status: AC
  Filled 2018-01-22: qty 1000

## 2018-01-22 MED ORDER — INSULIN ASPART 100 UNIT/ML ~~LOC~~ SOLN
2.0000 [IU] | SUBCUTANEOUS | Status: DC
  Administered 2018-01-22: 4 [IU] via SUBCUTANEOUS
  Administered 2018-01-23: 2 [IU] via SUBCUTANEOUS
  Administered 2018-01-23 (×3): 4 [IU] via SUBCUTANEOUS
  Administered 2018-01-24: 6 [IU] via SUBCUTANEOUS
  Administered 2018-01-24 (×2): 4 [IU] via SUBCUTANEOUS
  Administered 2018-01-24: 6 [IU] via SUBCUTANEOUS
  Administered 2018-01-24: 4 [IU] via SUBCUTANEOUS
  Administered 2018-01-24: 6 [IU] via SUBCUTANEOUS
  Administered 2018-01-24: 4 [IU] via SUBCUTANEOUS
  Administered 2018-01-25: 2 [IU] via SUBCUTANEOUS
  Administered 2018-01-25: 6 [IU] via SUBCUTANEOUS

## 2018-01-22 MED ORDER — ONDANSETRON HCL 4 MG/2ML IJ SOLN
4.0000 mg | Freq: Four times a day (QID) | INTRAMUSCULAR | Status: DC | PRN
  Administered 2018-01-22 – 2018-01-26 (×8): 4 mg via INTRAVENOUS
  Filled 2018-01-22 (×9): qty 2

## 2018-01-22 MED ORDER — METOPROLOL TARTRATE 12.5 MG HALF TABLET
12.5000 mg | ORAL_TABLET | Freq: Two times a day (BID) | ORAL | Status: DC
  Administered 2018-01-22 – 2018-01-23 (×2): 12.5 mg via ORAL
  Filled 2018-01-22 (×3): qty 1

## 2018-01-22 MED ORDER — NITROGLYCERIN 0.4 MG SL SUBL: 0.4000 mg | SUBLINGUAL_TABLET | SUBLINGUAL | Status: DC | PRN

## 2018-01-22 MED ORDER — LIDOCAINE HCL (PF) 1 % IJ SOLN
INTRAMUSCULAR | Status: DC | PRN
  Administered 2018-01-22: 2 mL

## 2018-01-22 MED ORDER — ZOLPIDEM TARTRATE 5 MG PO TABS: 5.0000 mg | ORAL_TABLET | Freq: Every evening | ORAL | Status: DC | PRN

## 2018-01-22 MED ORDER — HEPARIN (PORCINE) IN NACL 2-0.9 UNITS/ML
INTRAMUSCULAR | Status: AC | PRN
  Administered 2018-01-22 (×2): 500 mL

## 2018-01-22 SURGICAL SUPPLY — 28 items
BALLN SAPPHIRE 1.5X10 (BALLOONS) ×2
BALLN SAPPHIRE 2.0X12 (BALLOONS) ×2
BALLN SAPPHIRE 2.5X15 (BALLOONS) ×2
BALLN SAPPHIRE ~~LOC~~ 3.25X15 (BALLOONS) ×1 IMPLANT
BALLN ~~LOC~~ EUPHORA RX 2.75X15 (BALLOONS) ×2
BALLOON SAPPHIRE 1.5X10 (BALLOONS) IMPLANT
BALLOON SAPPHIRE 2.0X12 (BALLOONS) IMPLANT
BALLOON SAPPHIRE 2.5X15 (BALLOONS) IMPLANT
BALLOON ~~LOC~~ EUPHORA RX 2.75X15 (BALLOONS) IMPLANT
CATH LAUNCHER 6FR 3DRC (CATHETERS) IMPLANT
CATH LAUNCHER 6FR JR4 (CATHETERS) ×1 IMPLANT
CATH OPTITORQUE TIG 4.0 5F (CATHETERS) ×1 IMPLANT
CATH VISTA GUIDE 6FR XBRCA (CATHETERS) ×1 IMPLANT
CATHETER LAUNCHER 6FR 3DRC (CATHETERS) ×2
DEVICE RAD COMP TR BAND LRG (VASCULAR PRODUCTS) ×1 IMPLANT
ELECT DEFIB PAD ADLT CADENCE (PAD) ×1 IMPLANT
GUIDEWIRE INQWIRE 1.5J.035X260 (WIRE) IMPLANT
INQWIRE 1.5J .035X260CM (WIRE) ×2
KIT HEART LEFT (KITS) ×2 IMPLANT
NDL PERC 21GX4CM (NEEDLE) IMPLANT
NEEDLE PERC 21GX4CM (NEEDLE) ×2 IMPLANT
PACK CARDIAC CATHETERIZATION (CUSTOM PROCEDURE TRAY) ×2 IMPLANT
SHEATH RAIN RADIAL 21G 6FR (SHEATH) ×1 IMPLANT
STENT SYNERGY DES 3X24 (Permanent Stent) ×1 IMPLANT
TRANSDUCER W/STOPCOCK (MISCELLANEOUS) ×2 IMPLANT
TUBING CIL FLEX 10 FLL-RA (TUBING) ×2 IMPLANT
WIRE HI TORQ BMW 190CM (WIRE) ×1 IMPLANT
WIRE PT2 MS 185 (WIRE) ×2 IMPLANT

## 2018-01-22 NOTE — Progress Notes (Addendum)
Talked with Dr. Claiborne Billings, patient did receive a dose of lasix in the cath lab.

## 2018-01-22 NOTE — H&P (Addendum)
Cardiology Admission History and Physical:   Patient ID: Daryl Rodriguez; MRN: [id number]; DOB: [birth year]   Admission date: 01/22/2018  Primary Care Provider: Nicoletta Dress, MD Primary Cardiologist: Shelva Majestic, MD  Primary Electrophysiologist:  N/A  Chief Complaint:  Chest pain, possible inferior STEMI  Patient Profile:   Daryl Rodriguez is a 84 y.o. male with a history of obesity, hypertension, hyperlipidemia and DM 2 presented as possible inferior STEMI  History of Present Illness:   Daryl Rodriguez is a 84 year old Caucasian male with past medical history of obesity, hypertension, hyperlipidemia and DM 2.  Patient denies any prior cardiac history.  His father had MI in his 1s.  He quit smoking roughly 50 years ago.  He takes amlodipine, clonidine, glimepiride, lisinopril, meloxicam and metoprolol at home.  Patient was in his usual state of health until he started having indigestion feeling for the past 4 days.  He was at his PCPs office and was given a dose of nitroglycerin this improved his chest discomfort.  EMS was called and patient was transported to [facility] for further evaluation.  En route, he had recurrent chest discomfort and was given another nitroglycerin and 324 mg aspirin.  Initial EKG showed a nonspecific changes including ST elevation in lead III and potential ST depression in anterolateral leads.  EKG strip obtained by EMS at 1607 also showed persistent ST elevation in lead III and possibly aVF as well.  On arrival to [facility] ED, he continued to have persistent chest discomfort.  He was taken urgently to Cath Lab as a STEMI.  He denies any previous history of bleeding issue or stroke.  Labs are currently pending at this time.  He denies any renal issues in the past or any allergies to what medication.   Past Medical History:  Diagnosis Date  . DM II (diabetes mellitus, type II), controlled ([place])   . Hyperlipidemia   . Hypertension     Past  Surgical History:  Procedure Laterality Date  . BACK SURGERY    . WRIST SURGERY       Medications Prior to Admission: Prior to Admission medications   Not on File     Allergies:   No Known Allergies  Social History:   Social History   Socioeconomic History  . Marital status: Married    Spouse name: Not on file  . Number of children: Not on file  . Years of education: Not on file  . Highest education level: Not on file  Occupational History  . Not on file  Social Needs  . Financial resource strain: Not on file  . Food insecurity:    Worry: Not on file    Inability: Not on file  . Transportation needs:    Medical: Not on file    Non-medical: Not on file  Tobacco Use  . Smoking status: Former Research scientist (life sciences)  . Smokeless tobacco: Never Used  . Tobacco comment: quit smoking 50 years ago  Substance and Sexual Activity  . Alcohol use: Not Currently  . Drug use: Not Currently  . Sexual activity: Not on file  Lifestyle  . Physical activity:    Days per week: Not on file    Minutes per session: Not on file  . Stress: Not on file  Relationships  . Social connections:    Talks on phone: Not on file    Gets together: Not on file    Attends religious service: Not on file    Active  member of club or organization: Not on file    Attends meetings of clubs or organizations: Not on file    Relationship status: Not on file  . Intimate partner violence:    Fear of current or ex partner: Not on file    Emotionally abused: Not on file    Physically abused: Not on file    Forced sexual activity: Not on file  Other Topics Concern  . Not on file  Social History Narrative  . Not on file    Family History:   The patient's family history includes Heart attack (age of onset: 60) in his father.    ROS:  Please see the history of present illness.  All other ROS reviewed and negative.     Physical Exam/Data:   Vitals:   01/22/18 1621  SpO2: (!) 78%   No intake or output data in the  24 hours ending 01/22/18 1635 There were no vitals filed for this visit. There is no height or weight on file to calculate BMI.  General:  Well nourished, well developed, in no acute distress HEENT: normal Lymph: no adenopathy Neck: no JVD Endocrine:  No thryomegaly Vascular: No carotid bruits; FA pulses 2+ bilaterally without bruits  Cardiac:  normal S1, S2; RRR; no murmur  Lungs:  Anterior exam clear to auscultation bilaterally, no wheezing, rhonchi, Mild basilar rale Abd: soft, nontender, no hepatomegaly  Ext: no edema Musculoskeletal:  No deformities, BUE and BLE strength normal and equal Skin: warm and dry  Neuro:  CNs 2-12 intact, no focal abnormalities noted Psych:  Normal affect    EKG:  The ECG that was done by EMS was personally reviewed and demonstrates ST elevation in inferior leads with corresponding J-point depression in anterolateral leads.  Relevant CV Studies: N/A  Laboratory Data:  ChemistryNo results for input(s): NA, K, CL, CO2, GLUCOSE, BUN, CREATININE, CALCIUM, GFRNONAA, GFRAA, ANIONGAP in the last 168 hours.  No results for input(s): PROT, ALBUMIN, AST, ALT, ALKPHOS, BILITOT in the last 168 hours. HematologyNo results for input(s): WBC, RBC, HGB, HCT, MCV, MCH, MCHC, RDW, PLT in the last 168 hours. Cardiac EnzymesNo results for input(s): TROPONINI in the last 168 hours. No results for input(s): TROPIPOC in the last 168 hours.  BNPNo results for input(s): BNP, PROBNP in the last 168 hours.  DDimer No results for input(s): DDIMER in the last 168 hours.  Radiology/Studies:  No results found.  Assessment and Plan:   1. Inferior STEMI: Undergoing emergent cardiac catheterization.  -Admit to ICU, serial troponin, obtain echocardiogram.  -Dual antiplatelet therapy per interventional cardiologist.  -cardiac risk factors include HTN, HLD, and DM II, former smoker, age, obesity  2. Hypertension: Blood pressure stable on arrival.  Appears to be on multiple  medications including amlodipine, clonidine, lisinopril and metoprolol at home.  Will resume metoprolol and lisinopril and uptitrate other medication as needed.  3. Hyperlipidemia: Does not appears to be on any statin at home, he will need high intensity statin.  4. DM2: On glimepiride at home, sliding scale insulin  5. Obesity: need eval for OSA    Severity of Illness: The appropriate patient status for this patient is INPATIENT. Inpatient status is judged to be reasonable and necessary in order to provide the required intensity of service to ensure the patient's safety. The patient's presenting symptoms, physical exam findings, and initial radiographic and laboratory data in the context of their chronic comorbidities is felt to place them at high risk for further clinical  deterioration. Furthermore, it is not anticipated that the patient will be medically stable for discharge from the hospital within 2 midnights of admission. The following factors support the patient status of inpatient.   " The patient's presenting symptoms include severe chest pain. " The worrisome physical exam findings include benign. " The initial radiographic and laboratory data are worrisome because of EKG suggesting inferior STEMI. " The chronic co-morbidities include HTN, HLD, and DM II, former smoker, age, obesity.   * I certify that at the point of admission it is my clinical judgment that the patient will require inpatient hospital care spanning beyond 2 midnights from the point of admission due to high intensity of service, high risk for further deterioration and high frequency of surveillance required.*    For questions or updates, please contact [place] Please consult [email or web address] for contact info under Cardiology/STEMI.    Hilbert Corrigan, [place]  01/22/2018 4:35 PM   Patient seen and examined. Agree with assessment and plan.  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  For the past 4 days he is started to notice indigestion-like sensation with chest discomfort.  He also has noticed increasing cough.  Today he became more short of breath.  He was seen by his primary physician and his pain improved with nitroglycerin.  He is brought by EMS and ECG suggests inferior ST elevation with ST depression anterolaterally.  He is taken acutely to the cardiac catheterization laboratory for emergent cardiac catheterization.  Suspect his cough may be contributed by ischemia mediated pulmonary edema/CHF.  Plan emergent cardiac catheterization and possible intervention.   Troy Sine, MD, [facility] 01/22/2018 6:16 PM

## 2018-01-22 NOTE — Consult Note (Addendum)
Name: Daryl Rodriguez MRN: [id number] DOB: 15-Jan-1934    ADMISSION DATE:  01/22/2018 CONSULTATION DATE:  01/22/2018  REFERRING MD :  Dr. Claiborne Billings  CHIEF COMPLAINT:  Hypoxia  HISTORY OF PRESENT ILLNESS:  84 year old with past medical history of former smoker- quit 50 years ago, OSA on CPAP nightly, hypertension, hyperlipidemia, PVD, DMT2, and obesity who was admitted to Cardiology on 5/20 after presenting with inferior STEMI.  He denies any prior lung issues.  Is compliant with his CPAP at night.  Does not wear oxygen.  He developed some shortness of breath around 2 weeks ago with associated lower extremity swelling.  Denied any fever, chills, or cough.  He stated he had indigestion for 4 days prior to going to his PCP.  There he received nitroglycerin which helped his chest discomfort.  His chest pain persisted with EMS and on arrival to ER, his EKG showed persistent elevation in lead III and aVF and therefore taken emergently to cath lab as a STEMI.  A stent was placed to his mid RCA.  He required NRB during the procedure for ongoing hypoxia. ABG showing 7.33/39.8/53/21.  He received lasix 40mg  during procedure.   On return to ICU, his hypoxia continued and work of breathing increased requiring BiPAP.  Due to worsening respiratory distress, PCCM consulted.   PAST MEDICAL HISTORY :   has a past medical history of DM II (diabetes mellitus, type II), controlled ([place]), Hyperlipidemia, and Hypertension.  has a past surgical history that includes Back surgery and Wrist surgery. Prior to Admission medications   Not on File   No Known Allergies  FAMILY HISTORY:  family history includes Heart attack (age of onset: 84) in his father. SOCIAL HISTORY:  reports that he has quit smoking. He has never used smokeless tobacco. He reports that he drank alcohol. He reports that he has current or past drug history.  REVIEW OF SYSTEMS:  POSITIVES IN BOLD  Gen: Denies fever, chills, weight change, fatigue,  night sweats HEENT: Denies vision changes, sinus congestion, rhinorrhea, sore throat, dysphagia PULM: Denies shortness of breath, cough, sputum production, hemoptysis, wheezing CV: Denies chest pain, edema, orthopnea, palpitations GI: Denies abdominal pain, nausea, vomiting, diarrhea, change in stool habitrs GU: Denies dysuria, hematuria, polyuria, oliguria, frequent urination and incomplete drainage Endocrine: Denies hot or cold intolerance, polyuria, polyphagia or appetite change Derm: Denies rash, dry skin Heme: Denies easy bruising, bleeding Neuro: Denies headache, numbness, weakness, slurred speech, loss of memory or consciousness   SUBJECTIVE:  No complaints at present.  SOB improved on BiPAP  VITAL SIGNS: Temp:  [98.2 F (36.8 C)] 98.2 F (36.8 C) (05/20 1845) Pulse Rate:  [60-183] 109 (05/20 1849) Resp:  [10-39] 29 (05/20 1849) BP: (106-150)/(68-88) 145/72 (05/20 1849) SpO2:  [55 %-95 %] 93 % (05/20 1849) FiO2 (%):  [80 %] 80 % (05/20 1849) Weight:  [180 lb (81.6 kg)-226 lb 3.1 oz (102.6 kg)] 226 lb 3.1 oz (102.6 kg) (05/20 1845)  PHYSICAL EXAMINATION: General:  Well nourished elderly male sitting upright in bed in NAD on BiPAP HEENT: pupils equal /reactive, no apparent JVD, well fitting BiPAP mask Neuro: Alert/ oriented, MAE, non focal CV:  rrr, distant heart sounds PULM: even/non-labored on BiPAP 12/6, lungs bilaterally clear anteriorly, crackles in LLL, diminished RLL GI: obese, soft, mild tenderness in suprapubic area, bs active  Extremities: warm/dry, +2 BLE edema, TR band to right wrist Skin: no rashes    Recent Labs  Lab 01/22/18 1632  NA 135  K 4.3  CL 101  BUN 29*  CREATININE 1.20  GLUCOSE 172*   Recent Labs  Lab 01/22/18 1632  HGB 12.2*  11.4*  HCT 36.0*  35.1*  WBC 11.8*  PLT 227   No results found.  SIGNIFICANT EVENTS  5/20  Admit/ LHC  STUDIES:  5/20 LHC >>  BRIEF PATIENT DESCRIPTION:  50 yoM presenting with inferior STEMI s/p RCA  stent.  Ongoing hypoxia requiring NRB and BiPAP on arrival to ICU.  ASSESSMENT / PLAN:  Hypoxic respiratory failure c/w pulmonary edema P:  CXR now- c/w with pulm edema Continue BIPAP and wean FiO2 for sats > 94% KVO IVF and monitor renal function S/p lasix in Cath lab (appears lasix naive), would continue diuresis as renally and BP tolerated May need foley, bladder scan prn   Will need CPAP q HS when able to come off BiPAP  Inferior STEMI s/p LHC with mid RCA stent HTN, HLD P:  Per Cardiology TTE pending  DMT2 P;  CBG q 4 SSI  Family:  Patient updated on plan of care.  Granddaughter at bedside.  Pt's wife currently in rehab due to be discharged 5/21.  PCCM will continue to follow.    Kennieth Rad, AGACNP-BC [facility] Pgr: [phone] or if no answer [phone] 01/22/2018, 8:03 PM

## 2018-01-22 NOTE — Progress Notes (Signed)
Critical lab value Troponin 1.80. Expected post cath lab PCI with STEMI.   Shella Spearing, RN

## 2018-01-23 ENCOUNTER — Other Ambulatory Visit: Payer: Self-pay

## 2018-01-23 ENCOUNTER — Inpatient Hospital Stay (HOSPITAL_COMMUNITY): Payer: Medicare Other

## 2018-01-23 ENCOUNTER — Encounter (HOSPITAL_COMMUNITY): Payer: Self-pay | Admitting: Cardiovascular Disease

## 2018-01-23 DIAGNOSIS — I2119 ST elevation (STEMI) myocardial infarction involving other coronary artery of inferior wall: Secondary | ICD-10-CM

## 2018-01-23 DIAGNOSIS — J810 Acute pulmonary edema: Secondary | ICD-10-CM

## 2018-01-23 DIAGNOSIS — R0603 Acute respiratory distress: Secondary | ICD-10-CM

## 2018-01-23 DIAGNOSIS — I340 Nonrheumatic mitral (valve) insufficiency: Secondary | ICD-10-CM

## 2018-01-23 LAB — HEPATIC FUNCTION PANEL
ALT: 30 U/L (ref 17–63)
AST: 35 U/L (ref 15–41)
Albumin: 2.6 g/dL — ABNORMAL LOW (ref 3.5–5.0)
Alkaline Phosphatase: 57 U/L (ref 38–126)
BILIRUBIN DIRECT: 0.3 mg/dL (ref 0.1–0.5)
BILIRUBIN INDIRECT: 1 mg/dL — AB (ref 0.3–0.9)
BILIRUBIN TOTAL: 1.3 mg/dL — AB (ref 0.3–1.2)
Total Protein: 5.9 g/dL — ABNORMAL LOW (ref 6.5–8.1)

## 2018-01-23 LAB — CBC
HCT: 30.3 % — ABNORMAL LOW (ref 39.0–52.0)
HEMOGLOBIN: 9.9 g/dL — AB (ref 13.0–17.0)
MCH: 30.8 pg (ref 26.0–34.0)
MCHC: 32.7 g/dL (ref 30.0–36.0)
MCV: 94.4 fL (ref 78.0–100.0)
PLATELETS: 207 10*3/uL (ref 150–400)
RBC: 3.21 MIL/uL — ABNORMAL LOW (ref 4.22–5.81)
RDW: 13.8 % (ref 11.5–15.5)
WBC: 9.6 10*3/uL (ref 4.0–10.5)

## 2018-01-23 LAB — ECHOCARDIOGRAM COMPLETE
Height: 69 in
Weight: 3619.07 oz

## 2018-01-23 LAB — BASIC METABOLIC PANEL
Anion gap: 10 (ref 5–15)
BUN: 30 mg/dL — ABNORMAL HIGH (ref 6–20)
CHLORIDE: 102 mmol/L (ref 101–111)
CO2: 25 mmol/L (ref 22–32)
Calcium: 8.6 mg/dL — ABNORMAL LOW (ref 8.9–10.3)
Creatinine, Ser: 1.43 mg/dL — ABNORMAL HIGH (ref 0.61–1.24)
GFR, EST AFRICAN AMERICAN: 51 mL/min — AB (ref >60)
GFR, EST NON AFRICAN AMERICAN: 44 mL/min — AB (ref >60)
Glucose, Bld: 148 mg/dL — ABNORMAL HIGH (ref 65–99)
POTASSIUM: 4.1 mmol/L (ref 3.5–5.1)
SODIUM: 137 mmol/L (ref 135–145)

## 2018-01-23 LAB — GLUCOSE, CAPILLARY
GLUCOSE-CAPILLARY: 100 mg/dL — AB (ref 65–99)
GLUCOSE-CAPILLARY: 152 mg/dL — AB (ref 65–99)
GLUCOSE-CAPILLARY: 169 mg/dL — AB (ref 65–99)
Glucose-Capillary: 131 mg/dL — ABNORMAL HIGH (ref 65–99)
Glucose-Capillary: 109 mg/dL — ABNORMAL HIGH (ref 65–99)
Glucose-Capillary: 199 mg/dL — ABNORMAL HIGH (ref 65–99)

## 2018-01-23 MED ORDER — FUROSEMIDE 10 MG/ML IJ SOLN
40.0000 mg | Freq: Four times a day (QID) | INTRAMUSCULAR | Status: AC
Start: 2018-01-23 — End: 2018-01-23
  Administered 2018-01-23 (×2): 40 mg via INTRAVENOUS
  Filled 2018-01-23 (×2): qty 4

## 2018-01-23 MED ORDER — METOPROLOL TARTRATE 5 MG/5ML IV SOLN
5.0000 mg | Freq: Once | INTRAVENOUS | Status: AC
Start: 2018-01-23 — End: 2018-01-23
  Administered 2018-01-23: 5 mg via INTRAVENOUS
  Filled 2018-01-23: qty 5

## 2018-01-23 MED ORDER — DILTIAZEM HCL-DEXTROSE 100-5 MG/100ML-% IV SOLN (PREMIX)
5.0000 mg/h | INTRAVENOUS | Status: DC
  Administered 2018-01-23: 10 mg/h via INTRAVENOUS
  Administered 2018-01-23 – 2018-01-24 (×5): 20 mg/h via INTRAVENOUS
  Filled 2018-01-23 (×6): qty 100

## 2018-01-23 MED ORDER — METOPROLOL TARTRATE 12.5 MG HALF TABLET
12.5000 mg | ORAL_TABLET | Freq: Once | ORAL | Status: AC
  Administered 2018-01-23: 12.5 mg via ORAL

## 2018-01-23 MED ORDER — HEPARIN (PORCINE) IN NACL 100-0.45 UNIT/ML-% IJ SOLN
1500.0000 [IU]/h | INTRAMUSCULAR | Status: DC
  Administered 2018-01-23: 1100 [IU]/h via INTRAVENOUS
  Administered 2018-01-24: 1400 [IU]/h via INTRAVENOUS
  Administered 2018-01-25: 1500 [IU]/h via INTRAVENOUS
  Filled 2018-01-23 (×3): qty 250

## 2018-01-23 MED ORDER — ALUM & MAG HYDROXIDE-SIMETH 200-200-20 MG/5ML PO SUSP: 30.0000 mL | ORAL | Status: DC | PRN

## 2018-01-23 MED ORDER — POTASSIUM CHLORIDE CRYS ER 20 MEQ PO TBCR
40.0000 meq | EXTENDED_RELEASE_TABLET | Freq: Once | ORAL | Status: AC
  Administered 2018-01-23: 40 meq via ORAL
  Filled 2018-01-23: qty 2

## 2018-01-23 MED ORDER — METOPROLOL TARTRATE 25 MG PO TABS
25.0000 mg | ORAL_TABLET | Freq: Four times a day (QID) | ORAL | Status: DC
  Administered 2018-01-23 – 2018-01-24 (×2): 25 mg via ORAL
  Filled 2018-01-23 (×2): qty 1

## 2018-01-23 MED ORDER — METOPROLOL TARTRATE 12.5 MG HALF TABLET: 12.5000 mg | ORAL_TABLET | Freq: Once | ORAL | Status: DC

## 2018-01-23 MED ORDER — HEPARIN BOLUS VIA INFUSION
3000.0000 [IU] | Freq: Once | INTRAVENOUS | Status: AC
  Administered 2018-01-23: 3000 [IU] via INTRAVENOUS
  Filled 2018-01-23: qty 3000

## 2018-01-23 MED FILL — Heparin Sodium (Porcine) Inj 1000 Unit/ML: INTRAMUSCULAR | Qty: 10 | Status: AC

## 2018-01-23 MED FILL — Heparin Sod (Porcine)-NaCl IV Soln 1000 Unit/500ML-0.9%: INTRAVENOUS | Qty: 1000 | Status: AC

## 2018-01-23 NOTE — Progress Notes (Signed)
  Echocardiogram 2D Echocardiogram has been performed.  Daryl Rodriguez T Andrzej Scully 01/23/2018, 10:16 AM

## 2018-01-23 NOTE — Progress Notes (Addendum)
Cardiologist paged again. No call back yet.

## 2018-01-23 NOTE — Progress Notes (Signed)
Cardiologist page regarding pt afib RVR despite diltiazem at 20.  No call back.

## 2018-01-23 NOTE — Progress Notes (Signed)
ANTICOAGULATION CONSULT NOTE - Initial Consult  Pharmacy Consult for heparin Indication: atrial fibrillation  No Known Allergies  Patient Measurements: Height: 5\' 9"  (175.3 cm) Weight: 226 lb 3.1 oz (102.6 kg) IBW/kg (Calculated) : 70.7 Heparin Dosing Weight: 92kg  Vital Signs: Temp: 98.4 F (36.9 C) (05/21 1206) Temp Source: Oral (05/21 1206) BP: 132/76 (05/21 1520) Pulse Rate: 86 (05/21 1520)  Labs: Recent Labs    01/22/18 1632 01/22/18 1934 01/23/18 0258  HGB 12.2*  11.4*  --  9.9*  HCT 36.0*  35.1*  --  30.3*  PLT 227  --  207  APTT 33  --   --   LABPROT 15.0  --   --   INR 1.19  --   --   CREATININE 1.39*  1.20  --  1.43*  TROPONINI 1.80* 2.75*  --     Estimated Creatinine Clearance: 47.8 mL/min (A) (by C-G formula based on SCr of 1.43 mg/dL (H)).   Medical History: Past Medical History:  Diagnosis Date  . DM II (diabetes mellitus, type II), controlled ([place])   . Hyperlipidemia   . Hypertension     Assessment: 84 year old s/p stemi yesterday. Now with afib this afternoon, rate in 140s, no issues with cath access site. Will start diltiazem and heparin.   Goal of Therapy:  Heparin level 0.3-0.7 units/ml Monitor platelets by anticoagulation protocol: Yes   Plan:  Give 3000 units bolus x 1 Start heparin infusion at 1100 units/hr Check anti-Xa level in 8 hours and daily while on heparin Continue to monitor H&H and platelets  Erin Hearing PharmD., BCPS Clinical Pharmacist 01/23/2018 3:58 PM

## 2018-01-23 NOTE — Progress Notes (Addendum)
  1520 Bedside shift report, pt sitting up in chair, pt in Afib with RVR. Denies SOB, c/o indigestion, and pt is heard belching. Granddaughter at bedside. Cards called and new orders received for PO metoprolol. Awaiting further orders. WCTM.  1600 Received orders for heparin gtt and cardizem IV, medicated per MAR. Pt c/o "indigestion" and belching.  1615 Pt called RN to room, started vomiting, c/o indigestion & CP, HR still in 150s. Pt assisted to [facility], had large watery stool. Cards NP paged for new orders. Awaiting call back.   [place] paged cards for 2nd attempt, HR still uncontrolled. Pt states his indigestion is "better" after vomiting and having a BM.   1720 HR slowly decreasing. WCTM.   [place] paged MD, new orders received. Pt resting comfortably, NAD, WCTM.   1850 Pt resting comfortably in recliner, HR still uncontrolled Afib. No complaints of pain, SOB. Awaiting to give shift report to oncoming RN.

## 2018-01-23 NOTE — Progress Notes (Signed)
Cardiologist paged a third time regarding patient.

## 2018-01-23 NOTE — Progress Notes (Signed)
Progress Note  Patient Name: Daryl Rodriguez Date of Encounter: 01/23/2018  Primary Cardiologist: Shelva Majestic, MD   Subjective   Postop day 1 inferior STEMI.  The patient remains on 7 L of oxygen.  He denies chest pain.  He has remained hemodynamically and electrocardio graphically stable.  Inpatient Medications    Scheduled Meds: . aspirin EC  81 mg Oral Daily  . atorvastatin  80 mg Oral q1800  . insulin aspart  2-6 Units Subcutaneous Q4H  . isosorbide mononitrate  30 mg Oral Daily  . metoprolol tartrate  12.5 mg Oral BID  . sodium chloride flush  3 mL Intravenous Q12H  . ticagrelor  90 mg Oral BID   Continuous Infusions: . sodium chloride 10 mL/hr at 01/22/18 1900  . tirofiban 0.075 mcg/kg/min (01/22/18 1953)   PRN Meds: sodium chloride, acetaminophen, alum & mag hydroxide-simeth, nitroGLYCERIN, ondansetron (ZOFRAN) IV, sodium chloride flush   Vital Signs    Vitals:   01/23/18 0730 01/23/18 0800 01/23/18 0816 01/23/18 0818  BP: 124/62 136/64  136/64  Pulse: 83 87  99  Resp: 19 (!) 23  (!) 22  Temp:   98.4 F (36.9 C)   TempSrc:   Oral   SpO2: 95% 94%    Weight:      Height:        Intake/Output Summary (Last 24 hours) at 01/23/2018 0853 Last data filed at 01/23/2018 0758 Gross per 24 hour  Intake 497.38 ml  Output 961 ml  Net -463.62 ml   Filed Weights   01/22/18 1830 01/22/18 1845  Weight: 180 lb (81.6 kg) 226 lb 3.1 oz (102.6 kg)    Telemetry    Sinus rhythm with occasional PVCs- Personally Reviewed  ECG    Normal sinus rhythm at 87 with inferior Q waves and residual mild inferior ST segment elevation with lateral depression.- Personally Reviewed  Physical Exam   GEN: No acute distress.   Neck: No JVD Cardiac: RRR, no murmurs, rubs, or gallops.  Respiratory: Clear to auscultation bilaterally. GI: Soft, nontender, non-distended  MS: No edema; No deformity. Neuro:  Nonfocal  Psych: Normal affect  Extremities: Mild bruising right radial  puncture site   Labs    Chemistry Recent Labs  Lab 01/22/18 1632 01/23/18 0258  NA 135  135 137  K 4.4  4.3 4.1  CL 101  101 102  CO2 21* 25  GLUCOSE 163*  172* 148*  BUN 28*  29* 30*  CREATININE 1.39*  1.20 1.43*  CALCIUM 8.9 8.6*  PROT 6.9 5.9*  ALBUMIN 3.0* 2.6*  AST 22 35  ALT 24 30  ALKPHOS 65 57  BILITOT 1.2 1.3*  GFRNONAA 46* 44*  GFRAA 53* 51*  ANIONGAP 13 10     Hematology Recent Labs  Lab 01/22/18 1632 01/23/18 0258  WBC 11.8* 9.6  RBC 3.66* 3.21*  HGB 12.2*  11.4* 9.9*  HCT 36.0*  35.1* 30.3*  MCV 95.9 94.4  MCH 31.1 30.8  MCHC 32.5 32.7  RDW 13.9 13.8  PLT 227 207    Cardiac Enzymes Recent Labs  Lab 01/22/18 1632 01/22/18 1934  TROPONINI 1.80* 2.75*   No results for input(s): TROPIPOC in the last 168 hours.   BNP Recent Labs  Lab 01/22/18 1934  BNP 695.6*     DDimer No results for input(s): DDIMER in the last 168 hours.   Radiology    Dg Chest Port 1 View  Result Date: 01/22/2018 CLINICAL DATA:  Respiratory distress  EXAM: PORTABLE CHEST 1 VIEW COMPARISON:  Chest x-ray 08/07/2015 FINDINGS: Cardiomegaly with vascular congestion. Multifocal perihilar airspace disease. Probable tiny effusions. Aortic atherosclerosis. No pneumothorax. IMPRESSION: Cardiomegaly with vascular congestion and tiny pleural effusions. Multifocal perihilar airspace disease may reflect pulmonary edema versus bilateral pneumonia. Electronically Signed   By: Donavan Foil M.D.   On: 01/22/2018 20:19    Cardiac Studies   Cardiac catheterization (01/22/2018)  Conclusion     Mid Cx lesion is 75% stenosed.  Prox LAD to Mid LAD lesion is 65% stenosed.  Dist RCA lesion is 20% stenosed.  Mid RCA lesion is 30% stenosed.  Prox RCA to Mid RCA lesion is 100% stenosed.  Post intervention, there is a 0% residual stenosis.  A stent was successfully placed.   Acute ST segment elevation myocardial infarction secondary to total occlusion of a calcified mid  right coronary artery with thrombus formation with initial TIMI 0 flow.  Coronary calcification with multivessel CAD and additional 60-70% calcified stenosis of the mid LAD and 75% stenoses of the left circumflex vessel.  Difficult but successful percutaneous coronary intervention to the totally occluded calcified mid RCA with PTCA, and ultimate insertion of a 3.024 mm Synergy DES stent postdilated to 3.25 mm with the 100% occlusion being reduced to 0% and restoration of brisk TIMI-3 flow.  RECOMMENDATION: Dual antiplatelet therapy for minimum of 1 year and probably indefinitely.  Continue Aggrastat for 18 hours post procedure.  Initial medical therapy for concomitant CAD.  Due to continued hypoxemia, the patient may benefit from BiPAP therapy in the CCU with continued IV diuresis.  A 2-D echo Doppler study will be scheduled to assess LV function and valvular architecture.  High potency statin therapy, and guideline directed optimal post MI treatment.     Patient Profile     84 y.o. male with a history of hypertension, diabetes and hyperlipidemia but no prior cardiac history who had inferior STEMI yesterday treated with PCI and drug-eluting stenting via the right radial approach by Dr. Claiborne Billings with an excellent result.  The lesion was thrombotic.  He had moderate disease in his LAD and circumflex.  His troponin only increased to 2.75.  His EKG does show some mild residual ST segment depression in inferior leads.  He was diuresed 0.5 L.  Assessment & Plan    1: Inferior STEMI- postop day 1 inferior STEMI treated with PCI and drug-eluting stenting of the mid RCA with a synergy 3 mm x 24 mm long drug-eluting stent.  His troponin only increased to 2.75.  He is on dual antiplatelet therapy.  A 2D echo is pending.  Plan for medical treatment of his residual CAD  2: Essential hypertension- blood pressure controlled on metoprolol  3: Hyperlipidemia- on high-dose statin therapy  4: Hypoxemia- patient  is relatively hypoxemic on 7 L of oxygen with a sat of 94%.  He did diurese half a liter on IV Lasix although his serum creatinine increased from 1.2-1.4 which will need to carefully follow.  A 2D echocardiogram is pending.  For questions or updates, please contact [place] Please consult [email or web address] for contact info under Cardiology/STEMI.      Signed, Quay Burow, MD  01/23/2018, 8:53 AM

## 2018-01-23 NOTE — Progress Notes (Signed)
PULMONARY / CRITICAL CARE MEDICINE   Name: Daryl Rodriguez MRN: [id number] DOB: Jan 16, 1934    ADMISSION DATE:  01/22/2018 CONSULTATION DATE:  01/22/2018  REFERRING MD:  Claiborne Billings  CHIEF COMPLAINT:  dyspnea  HISTORY OF PRESENT ILLNESS:   84 y/o male admitted on 5/20 with a STEMI, had acute pulmonary edema and acute respiratory failure with hypoxemia.  PAST MEDICAL HISTORY :  He  has a past medical history of DM II (diabetes mellitus, type II), controlled ([place]), Hyperlipidemia, and Hypertension.   SUBJECTIVE:  Feels better Says that for the last few days he had "heartburn"  VITAL SIGNS: BP (!) 159/68   Pulse 93   Temp 98.4 F (36.9 C) (Oral)   Resp (!) 24   Ht 5\' 9"  (1.753 m)   Wt 102.6 kg (226 lb 3.1 oz)   SpO2 96%   BMI 33.40 kg/m   HEMODYNAMICS:    VENTILATOR SETTINGS: FiO2 (%):  [80 %] 80 %  INTAKE / OUTPUT: I/O last 3 completed shifts: In: 497.4 [I.V.:497.4] Out: 881 [Urine:730; Emesis/NG output:150; Stool:1]  PHYSICAL EXAMINATION:  General:  Resting comfortably in bed HENT: NCAT OP clear PULM: Crackles bases B, normal effort CV: RRR, no mgr GI: BS+, soft, nontender MSK: normal bulk and tone Neuro: awake, alert, no distress, MAEW   LABS:  BMET Recent Labs  Lab 01/22/18 1632 01/23/18 0258  NA 135  135 137  K 4.4  4.3 4.1  CL 101  101 102  CO2 21* 25  BUN 28*  29* 30*  CREATININE 1.39*  1.20 1.43*  GLUCOSE 163*  172* 148*    Electrolytes Recent Labs  Lab 01/22/18 1632 01/23/18 0258  CALCIUM 8.9 8.6*    CBC Recent Labs  Lab 01/22/18 1632 01/23/18 0258  WBC 11.8* 9.6  HGB 12.2*  11.4* 9.9*  HCT 36.0*  35.1* 30.3*  PLT 227 207    Coag's Recent Labs  Lab 01/22/18 1632  APTT 33  INR 1.19    Sepsis Markers No results for input(s): LATICACIDVEN, PROCALCITON, O2SATVEN in the last 168 hours.  ABG Recent Labs  Lab 01/22/18 1645  PHART 7.330*  PCO2ART 39.8  PO2ART 53.0*    Liver Enzymes Recent Labs  Lab  01/22/18 1632 01/23/18 0258  AST 22 35  ALT 24 30  ALKPHOS 65 57  BILITOT 1.2 1.3*  ALBUMIN 3.0* 2.6*    Cardiac Enzymes Recent Labs  Lab 01/22/18 1632 01/22/18 1934  TROPONINI 1.80* 2.75*    Glucose Recent Labs  Lab 01/22/18 1947 01/22/18 2100 01/22/18 2343 01/23/18 0342 01/23/18 0820  GLUCAP 188* 187* 172* 131* 100*    Imaging Dg Chest Port 1 View  Result Date: 01/22/2018 CLINICAL DATA:  Respiratory distress EXAM: PORTABLE CHEST 1 VIEW COMPARISON:  Chest x-ray 08/07/2015 FINDINGS: Cardiomegaly with vascular congestion. Multifocal perihilar airspace disease. Probable tiny effusions. Aortic atherosclerosis. No pneumothorax. IMPRESSION: Cardiomegaly with vascular congestion and tiny pleural effusions. Multifocal perihilar airspace disease may reflect pulmonary edema versus bilateral pneumonia. Electronically Signed   By: Donavan Foil M.D.   On: 01/22/2018 20:19     STUDIES:  5/20 LHC > RCA occlusion, PCI  CULTURES:   ANTIBIOTICS: none  SIGNIFICANT EVENTS: 5/20 admission, LHC, BIPAP  LINES/TUBES:   DISCUSSION: 84 yo male with acute pulmonary edema after a STEMI.  ASSESSMENT / PLAN:  PULMONARY A: Acute respiratory faliure with hypoxemia Acute pulmonary edema P:   Continue diuresis today with potassium supplementation Continue O2 to maintain O2 saturation > 90%  CARDIOVASCULAR A:  STEMI P:  Tele [place], brilinta, metoprolol and statin per cardiology Follow up echocardiogram result  RENAL A:   Mild renal insufficiency> chronic? P:   Monitor BMET and UOP Replace electrolytes as needed   FAMILY  - Updates: none bedside   Would maintain in ICU today  Roselie Awkward, MD [place] Pager: [phone] Cell: [phone] After 3pm or if no response, call [phone]    01/23/2018, 11:05 AM

## 2018-01-23 NOTE — Progress Notes (Signed)
Cardiologist called back with orders for 5 mg IV lopressor.  He also came to the bedside of the patient.    5mg  IV lopressor given.  afib 100s-120s post-med administration.   Cardiologist called back to re-evaluate patient condition and to increase PO lopressor to 25 mg q6.

## 2018-01-23 NOTE — Progress Notes (Signed)
Per insurance check on Brilinta  # 8. S/W BLANCA @ [facility] [facility] RX # [phone]    BRILINTA 90 MG BID  COVER- YES  CO-PAY- $ 393.31  TIER- 3 DRUG  PRIOR APPROVAL- NO  DEDUCTIBLE : NOT MET   PREFERRED PHARMACY : YES - [facility] AND [facility]

## 2018-01-23 NOTE — Progress Notes (Signed)
CCM NP at bedside to evaluate patient's respiratory status.  Patient on Bipap.  CXR obtained.   2100 pt had episode of vomiting. CCM NP told RN to place pt on HFNC d/t r/f aspiration.  RN communicated with respiratory.  Pt on 15L HFNC with O2 sats 95%.

## 2018-01-24 ENCOUNTER — Inpatient Hospital Stay (HOSPITAL_COMMUNITY): Payer: Medicare Other

## 2018-01-24 DIAGNOSIS — I472 Ventricular tachycardia: Secondary | ICD-10-CM

## 2018-01-24 DIAGNOSIS — J9600 Acute respiratory failure, unspecified whether with hypoxia or hypercapnia: Secondary | ICD-10-CM

## 2018-01-24 DIAGNOSIS — J9601 Acute respiratory failure with hypoxia: Secondary | ICD-10-CM

## 2018-01-24 LAB — CBC
HCT: 32.2 % — ABNORMAL LOW (ref 39.0–52.0)
HEMOGLOBIN: 10.5 g/dL — AB (ref 13.0–17.0)
MCH: 31.3 pg (ref 26.0–34.0)
MCHC: 32.6 g/dL (ref 30.0–36.0)
MCV: 96.1 fL (ref 78.0–100.0)
PLATELETS: 272 10*3/uL (ref 150–400)
RBC: 3.35 MIL/uL — AB (ref 4.22–5.81)
RDW: 14 % (ref 11.5–15.5)
WBC: 10.9 10*3/uL — AB (ref 4.0–10.5)

## 2018-01-24 LAB — TROPONIN I: Troponin I: 3.19 ng/mL (ref <0.03)

## 2018-01-24 LAB — BASIC METABOLIC PANEL
ANION GAP: 12 (ref 5–15)
ANION GAP: 15 (ref 5–15)
BUN: 37 mg/dL — ABNORMAL HIGH (ref 6–20)
BUN: 52 mg/dL — ABNORMAL HIGH (ref 6–20)
CALCIUM: 8.9 mg/dL (ref 8.9–10.3)
CO2: 22 mmol/L (ref 22–32)
CO2: 23 mmol/L (ref 22–32)
CREATININE: 1.78 mg/dL — AB (ref 0.61–1.24)
Calcium: 9.1 mg/dL (ref 8.9–10.3)
Chloride: 103 mmol/L (ref 101–111)
Chloride: 99 mmol/L — ABNORMAL LOW (ref 101–111)
Creatinine, Ser: 1.64 mg/dL — ABNORMAL HIGH (ref 0.61–1.24)
GFR calc non Af Amer: 38 mL/min — ABNORMAL LOW (ref >60)
GFR, EST AFRICAN AMERICAN: 44 mL/min — AB (ref >60)
GFR, EST AFRICAN AMERICAN: 39 mL/min — AB (ref >60)
GFR, EST NON AFRICAN AMERICAN: 34 mL/min — AB (ref >60)
Glucose, Bld: 182 mg/dL — ABNORMAL HIGH (ref 65–99)
Glucose, Bld: 244 mg/dL — ABNORMAL HIGH (ref 65–99)
Potassium: 3.8 mmol/L (ref 3.5–5.1)
Potassium: 4.5 mmol/L (ref 3.5–5.1)
SODIUM: 137 mmol/L (ref 135–145)
SODIUM: 137 mmol/L (ref 135–145)

## 2018-01-24 LAB — GLUCOSE, CAPILLARY
GLUCOSE-CAPILLARY: 223 mg/dL — AB (ref 65–99)
Glucose-Capillary: 165 mg/dL — ABNORMAL HIGH (ref 65–99)
Glucose-Capillary: 185 mg/dL — ABNORMAL HIGH (ref 65–99)
Glucose-Capillary: 187 mg/dL — ABNORMAL HIGH (ref 65–99)
Glucose-Capillary: 268 mg/dL — ABNORMAL HIGH (ref 65–99)

## 2018-01-24 LAB — MAGNESIUM
MAGNESIUM: 2 mg/dL (ref 1.7–2.4)
Magnesium: 2.1 mg/dL (ref 1.7–2.4)

## 2018-01-24 LAB — HEPARIN LEVEL (UNFRACTIONATED)
Heparin Unfractionated: 0.12 IU/mL — ABNORMAL LOW (ref 0.30–0.70)
Heparin Unfractionated: 0.29 IU/mL — ABNORMAL LOW (ref 0.30–0.70)
Heparin Unfractionated: 0.41 IU/mL (ref 0.30–0.70)

## 2018-01-24 MED ORDER — AMIODARONE HCL IN DEXTROSE 360-4.14 MG/200ML-% IV SOLN
60.0000 mg/h | INTRAVENOUS | Status: AC
  Administered 2018-01-24: 60 mg/h via INTRAVENOUS
  Filled 2018-01-24: qty 200

## 2018-01-24 MED ORDER — AMIODARONE LOAD VIA INFUSION
150.0000 mg | Freq: Once | INTRAVENOUS | Status: AC
  Administered 2018-01-24: 150 mg via INTRAVENOUS
  Filled 2018-01-24: qty 83.34

## 2018-01-24 MED ORDER — AMIODARONE HCL IN DEXTROSE 360-4.14 MG/200ML-% IV SOLN
30.0000 mg/h | INTRAVENOUS | Status: DC
  Administered 2018-01-24 – 2018-01-27 (×7): 30 mg/h via INTRAVENOUS
  Filled 2018-01-24 (×8): qty 200

## 2018-01-24 MED ORDER — LIDOCAINE BOLUS VIA INFUSION
100.0000 mg | Freq: Once | INTRAVENOUS | Status: AC
  Administered 2018-01-24: 100 mg via INTRAVENOUS

## 2018-01-24 MED ORDER — AMIODARONE IV BOLUS ONLY 150 MG/100ML: 150.0000 mg | Freq: Once | INTRAVENOUS | Status: DC

## 2018-01-24 MED ORDER — ORAL CARE MOUTH RINSE
15.0000 mL | Freq: Two times a day (BID) | OROMUCOSAL | Status: DC
  Administered 2018-01-24 – 2018-01-26 (×5): 15 mL via OROMUCOSAL

## 2018-01-24 MED ORDER — METOPROLOL TARTRATE 5 MG/5ML IV SOLN
5.0000 mg | Freq: Four times a day (QID) | INTRAVENOUS | Status: DC
  Administered 2018-01-24 – 2018-01-26 (×6): 5 mg via INTRAVENOUS
  Filled 2018-01-24 (×5): qty 5

## 2018-01-24 MED ORDER — ZOLPIDEM TARTRATE 5 MG PO TABS: 5.0000 mg | ORAL_TABLET | Freq: Every evening | ORAL | Status: DC | PRN

## 2018-01-24 MED ORDER — AMIODARONE HCL IN DEXTROSE 360-4.14 MG/200ML-% IV SOLN
60.0000 mg/h | INTRAVENOUS | Status: DC
  Administered 2018-01-24 (×3): 60 mg/h via INTRAVENOUS
  Filled 2018-01-24: qty 200

## 2018-01-24 MED ORDER — MIDAZOLAM HCL 2 MG/2ML IJ SOLN
2.0000 mg | Freq: Once | INTRAMUSCULAR | Status: AC
  Administered 2018-01-24: 2 mg via INTRAVENOUS

## 2018-01-24 MED ORDER — MIDAZOLAM HCL 2 MG/2ML IJ SOLN
INTRAMUSCULAR | Status: AC
  Administered 2018-01-24: 2 mg via INTRAVENOUS
  Filled 2018-01-24: qty 2

## 2018-01-24 MED ORDER — SENNOSIDES-DOCUSATE SODIUM 8.6-50 MG PO TABS
2.0000 | ORAL_TABLET | Freq: Two times a day (BID) | ORAL | Status: DC
  Administered 2018-01-24 – 2018-01-27 (×3): 2 via ORAL
  Filled 2018-01-24 (×4): qty 2

## 2018-01-24 MED ORDER — AMIODARONE IV BOLUS ONLY 150 MG/100ML
150.0000 mg | Freq: Once | INTRAVENOUS | Status: AC
  Administered 2018-01-24: 150 mg via INTRAVENOUS

## 2018-01-24 MED ORDER — FUROSEMIDE 10 MG/ML IJ SOLN
80.0000 mg | Freq: Once | INTRAMUSCULAR | Status: AC
  Administered 2018-01-24: 80 mg via INTRAVENOUS

## 2018-01-24 MED ORDER — LIDOCAINE IN D5W 4-5 MG/ML-% IV SOLN
2.0000 mg/min | INTRAVENOUS | Status: DC
  Administered 2018-01-24 – 2018-01-26 (×3): 2 mg/min via INTRAVENOUS
  Filled 2018-01-24 (×2): qty 500

## 2018-01-24 MED ORDER — METOPROLOL TARTRATE 50 MG PO TABS
50.0000 mg | ORAL_TABLET | Freq: Three times a day (TID) | ORAL | Status: DC
  Administered 2018-01-24: 50 mg via ORAL
  Filled 2018-01-24 (×2): qty 1

## 2018-01-24 MED ORDER — METOCLOPRAMIDE HCL 5 MG/ML IJ SOLN
5.0000 mg | Freq: Three times a day (TID) | INTRAMUSCULAR | Status: AC
  Administered 2018-01-24 – 2018-01-25 (×3): 5 mg via INTRAVENOUS
  Filled 2018-01-24 (×3): qty 2

## 2018-01-24 MED ORDER — LIDOCAINE BOLUS VIA INFUSION
50.0000 mg | Freq: Once | INTRAVENOUS | Status: AC
  Administered 2018-01-24: 50 mg via INTRAVENOUS

## 2018-01-24 MED ORDER — FUROSEMIDE 10 MG/ML IJ SOLN
INTRAMUSCULAR | Status: AC
  Administered 2018-01-24: 80 mg via INTRAVENOUS
  Filled 2018-01-24: qty 8

## 2018-01-24 MED ORDER — METOPROLOL TARTRATE 50 MG PO TABS
50.0000 mg | ORAL_TABLET | Freq: Two times a day (BID) | ORAL | Status: DC
  Administered 2018-01-24: 50 mg via ORAL
  Filled 2018-01-24: qty 1

## 2018-01-24 MED ORDER — METOPROLOL TARTRATE 5 MG/5ML IV SOLN
INTRAVENOUS | Status: AC
  Administered 2018-01-24: 5 mg via INTRAVENOUS
  Filled 2018-01-24: qty 5

## 2018-01-24 MED ORDER — AMIODARONE HCL IN DEXTROSE 360-4.14 MG/200ML-% IV SOLN: 30.0000 mg/h | INTRAVENOUS | Status: DC

## 2018-01-24 MED ORDER — LOPERAMIDE HCL 2 MG PO CAPS
2.0000 mg | ORAL_CAPSULE | ORAL | Status: DC | PRN
  Administered 2018-01-24: 2 mg via ORAL
  Filled 2018-01-24: qty 1

## 2018-01-24 MED ORDER — HEPARIN BOLUS VIA INFUSION
2000.0000 [IU] | Freq: Once | INTRAVENOUS | Status: AC
  Administered 2018-01-24: 2000 [IU] via INTRAVENOUS
  Filled 2018-01-24: qty 2000

## 2018-01-24 MED ORDER — AMIODARONE HCL IN DEXTROSE 360-4.14 MG/200ML-% IV SOLN
INTRAVENOUS | Status: AC
Start: 2018-01-24 — End: 2018-01-24
  Administered 2018-01-24: 150 mg via INTRAVENOUS
  Filled 2018-01-24: qty 400

## 2018-01-24 MED ORDER — DOCUSATE SODIUM 100 MG PO CAPS
100.0000 mg | ORAL_CAPSULE | Freq: Every day | ORAL | Status: DC
  Administered 2018-01-24 – 2018-01-27 (×3): 100 mg via ORAL
  Filled 2018-01-24 (×3): qty 1

## 2018-01-24 MED ORDER — FENTANYL CITRATE (PF) 100 MCG/2ML IJ SOLN
INTRAMUSCULAR | Status: AC
  Administered 2018-01-24: 50 ug via INTRAVENOUS
  Filled 2018-01-24: qty 2

## 2018-01-24 MED ORDER — FENTANYL CITRATE (PF) 100 MCG/2ML IJ SOLN
50.0000 ug | Freq: Once | INTRAMUSCULAR | Status: AC
  Administered 2018-01-24: 50 ug via INTRAVENOUS

## 2018-01-24 NOTE — Progress Notes (Signed)
Pt NG tube unable to flush or pull back, pt not currently complaining of any abdominal distension or pain. Called eLink, they are ok to d/c tube and not replace. Will continue to monitor pt closely.  Sherlie Ban, RN

## 2018-01-24 NOTE — Progress Notes (Signed)
Amiodarone Drug - Drug Interaction Consult Note  Recommendations: Monitor vital signs on metoprolol and diltiazem infusion. Currently on heparin infusion for anticoagulation. Will follow along for further medication interactions.   Amiodarone is metabolized by the cytochrome P450 system and therefore has the potential to cause many drug interactions. Amiodarone has an average plasma half-life of 50 days (range 20 to 100 days).   There is potential for drug interactions to occur several weeks or months after stopping treatment and the onset of drug interactions may be slow after initiating amiodarone.   [x]  Statins: Increased risk of myopathy. Simvastatin- restrict dose to 20mg  daily. Other statins: counsel patients to report any muscle pain or weakness immediately.  []  Anticoagulants: Amiodarone can increase anticoagulant effect. Consider warfarin dose reduction. Patients should be monitored closely and the dose of anticoagulant altered accordingly, remembering that amiodarone levels take several weeks to stabilize.  []  Antiepileptics: Amiodarone can increase plasma concentration of phenytoin, the dose should be reduced. Note that small changes in phenytoin dose can result in large changes in levels. Monitor patient and counsel on signs of toxicity.  [x]  Beta blockers: increased risk of bradycardia, AV block and myocardial depression. Sotalol - avoid concomitant use.  [x]   Calcium channel blockers (diltiazem and verapamil): increased risk of bradycardia, AV block and myocardial depression.  []   Cyclosporine: Amiodarone increases levels of cyclosporine. Reduced dose of cyclosporine is recommended.  []  Digoxin dose should be halved when amiodarone is started.  []  Diuretics: increased risk of cardiotoxicity if hypokalemia occurs.  []  Oral hypoglycemic agents (glyburide, glipizide, glimepiride): increased risk of hypoglycemia. Patient's glucose levels should be monitored closely when initiating  amiodarone therapy.   []  Drugs that prolong the QT interval:  Torsades de pointes risk may be increased with concurrent use - avoid if possible.  Monitor QTc, also keep magnesium/potassium WNL if concurrent therapy can't be avoided. Marland Kitchen Antibiotics: e.g. fluoroquinolones, erythromycin. . Antiarrhythmics: e.g. quinidine, procainamide, disopyramide, sotalol. . Antipsychotics: e.g. phenothiazines, haloperidol.  . Lithium, tricyclic antidepressants, and methadone.  Thank You,  Betsey Holiday  01/24/2018 10:39 AM

## 2018-01-24 NOTE — Progress Notes (Signed)
The patient developed sustained VT with ventricular rate 230 BPM at approximately 7:30 pm requiring cardioversion, the patient was on amiodarone drip and cardizem drip at the time. Another sustained VT requiring cardioversion happened approximately 20 minutes later. Lidocaine bolus 100 mg followed by infusion of 2 mg/min was initiated. Another load of amiodarone 150 mg iv will be ordered as well as metoprolol 5 mg iv Q6H. Diltiazem will be discontinued. The patient had crackles on physical exam and mild LE edema, Lasix 80 mg iv x 1 was ordered.  Fellow on call was contacted, the patient is conscious, vitals signs stable, denies chest pain. A repeat cardiac catheterization is scheduled for tomorrow morning. If the patient continues to run sustained VT, a STEMI doctor on call should be called for consideration of an earlier cath.  Ena Dawley, MD 01/24/2018

## 2018-01-24 NOTE — Progress Notes (Signed)
RN reports patient had episode of vomiting, runs of VT/SVT.  Diaphoretic.  Continues to report abdominal discomfort.  KUB / CXR reviewed.  Non-specific gaseous distention.    Plan: Reglan 5mg  IV Q8 x 3 doses Insert small bore NGT for decompression  Add amiodarone bolus with infusion  Anticipate transition off cardizem but will defer to Cardiology    Noe Gens, NP-C [facility] Pgr: [phone] or if no answer [phone] 01/24/2018, 10:24 AM

## 2018-01-24 NOTE — H&P (View-Only) (Signed)
The patient developed sustained VT with ventricular rate 230 BPM at approximately 7:30 pm requiring cardioversion, the patient was on amiodarone drip and cardizem drip at the time. Another sustained VT requiring cardioversion happened approximately 20 minutes later. Lidocaine bolus 100 mg followed by infusion of 2 mg/min was initiated. Another load of amiodarone 150 mg iv will be ordered as well as metoprolol 5 mg iv Q6H. Diltiazem will be discontinued. The patient had crackles on physical exam and mild LE edema, Lasix 80 mg iv x 1 was ordered.  Fellow on call was contacted, the patient is conscious, vitals signs stable, denies chest pain. A repeat cardiac catheterization is scheduled for tomorrow morning. If the patient continues to run sustained VT, a STEMI doctor on call should be called for consideration of an earlier cath.  Ena Dawley, MD 01/24/2018

## 2018-01-24 NOTE — Code Documentation (Signed)
  Patient Name: Daryl Rodriguez   MRN: [id number]   Date of Birth/ Sex: June 23, 1934 , male      Admission Date: 01/22/2018  Attending Provider: Troy Sine, MD  Primary Diagnosis: MI s/p RCA PCI     Responded to code blue for Daryl Rodriguez. At time of arrival, pt was alert and oriented with a pulse, in a-fib with RVR, maintaining blood pressure and oxygen saturation (on non-rebreather). He had just received a cardioversion for sustained VT with loss of consciousness but maintained a pulse throughout. Labs were drawn, primary team notified by nursing staff and discussed case, and chart was reviewed for pertinent information. Lidocaine 100 mg bolus followed by 2 mg/min infusion was initiated per EP recommendations from consult note earlier on 5/22. IV Lasix dose was also ordered due to suspicion of pulmonary edema on lung exam.   The pt returned to sustained VT with a pulse and remained awake and alert. Decision was made to repeat cardioversion (performed 1941) following administration of 2 mg versed, 50 mcg fentanyl which resulted in cessation of VT. Pt returned to an irregular rhythm with bigeminy, rates ~90s and was able to awaken upon stimulation shortly after. Dr. Meda Coffee arrived, care was handed off and plan of care made should he return to VT despite addition of lidocaine infusion.     Tawny Asal, MD  01/24/2018, 8:06 PM

## 2018-01-24 NOTE — Progress Notes (Signed)
PULMONARY / CRITICAL CARE MEDICINE   Name: Daryl Rodriguez MRN: [id number] DOB: 1933-10-22    ADMISSION DATE:  01/22/2018 CONSULTATION DATE:  01/22/2018  REFERRING MD:  Claiborne Billings  CHIEF COMPLAINT:  dyspnea  HISTORY OF PRESENT ILLNESS:   84 y/o male admitted on 5/20 with a STEMI, had acute pulmonary edema and acute respiratory failure with hypoxemia.   SUBJECTIVE: Pt reports abdominal discomfort / gas pains.  Feels stiff from sitting in bed.  RN reports pt had multiple liquid BM yesterday.  Remains in AF, rate 100-1teens. O2 decreased to 4L.    VITAL SIGNS: BP (!) 131/98   Pulse 91   Temp 98.2 F (36.8 C) (Oral)   Resp (!) 23   Ht 5\' 9"  (1.753 m)   Wt 226 lb 3.1 oz (102.6 kg)   SpO2 95%   BMI 33.40 kg/m   HEMODYNAMICS:    VENTILATOR SETTINGS:    INTAKE / OUTPUT: I/O last 3 completed shifts: In: 1417.2 [P.O.:540; I.V.:877.2] Out: 1721 [Urine:1570; Emesis/NG output:150; Stool:1]  PHYSICAL EXAMINATION: General:  Elderly male in NAD sitting up in bed HEENT: MM pink/moist Neuro: AAOx4, speech clear, MAE  CV: s1s2 irr irr, AF on monitor PULM: even/non-labored, lungs bilaterally clear anterior, diminished lower lateral/bases  UK:GURK, non-tender, bsx4 active  Extremities: warm/dry, 1-2+ pitting BLE edema  Skin: no rashes or lesions  LABS:  BMET Recent Labs  Lab 01/22/18 1632 01/23/18 0258 01/24/18 0004  NA 135  135 137 137  K 4.4  4.3 4.1 4.5  CL 101  101 102 103  CO2 21* 25 22  BUN 28*  29* 30* 37*  CREATININE 1.39*  1.20 1.43* 1.64*  GLUCOSE 163*  172* 148* 182*    Electrolytes Recent Labs  Lab 01/22/18 1632 01/23/18 0258 01/24/18 0004  CALCIUM 8.9 8.6* 8.9    CBC Recent Labs  Lab 01/22/18 1632 01/23/18 0258 01/24/18 0004  WBC 11.8* 9.6 10.9*  HGB 12.2*  11.4* 9.9* 10.5*  HCT 36.0*  35.1* 30.3* 32.2*  PLT 227 207 272    Coag's Recent Labs  Lab 01/22/18 1632  APTT 33  INR 1.19    Sepsis Markers No results for input(s):  LATICACIDVEN, PROCALCITON, O2SATVEN in the last 168 hours.  ABG Recent Labs  Lab 01/22/18 1645  PHART 7.330*  PCO2ART 39.8  PO2ART 53.0*    Liver Enzymes Recent Labs  Lab 01/22/18 1632 01/23/18 0258  AST 22 35  ALT 24 30  ALKPHOS 65 57  BILITOT 1.2 1.3*  ALBUMIN 3.0* 2.6*    Cardiac Enzymes Recent Labs  Lab 01/22/18 1632 01/22/18 1934  TROPONINI 1.80* 2.75*    Glucose Recent Labs  Lab 01/23/18 1159 01/23/18 1621 01/23/18 2029 01/23/18 2346 01/24/18 0421 01/24/18 0742  GLUCAP 109* 152* 199* 169* 165* 187*    Imaging No results found.   STUDIES:  5/20 LHC > RCA occlusion, PCI 5/21 ECHO > LVEF 40-45%, diffuse hypokinesis, grade 1 diastolic dysfunction, PA peak 75mmHg  CULTURES:   ANTIBIOTICS:   SIGNIFICANT EVENTS: 5/20 Admit with chest pain, LHC, BIPAP  LINES/TUBES:   DISCUSSION: 84 y/o male with acute pulmonary edema after a STEMI.  ASSESSMENT / PLAN:  PULMONARY A: Acute respiratory faliure with hypoxemia Acute pulmonary edema N/V Episode on BiPAP  OSA - on CPAP at home P:   Wean O2 for sats > 92% Hold further lasix 5/22  Assess CXR QHS CPAP & PRN day time sleep  CARDIOVASCULAR A:  STEMI P:  ICU monitoring  Continue ASA, Brilinta, Metoprolol, Imdur, Lipitor  Hepain gtt per pharmacy  Cardizem gtt   RENAL A:   Mild renal insufficiency - 2016 sr cr 0.9-1 P:   Trend BMP / urinary output Replace electrolytes as indicated Avoid nephrotoxic agents, ensure adequate renal perfusion  GI  A: Abdominal Discomfort, Diarrhea- r/ule out ileus Prior N/V P: Continue colace, senokot for now Hold above for diarrhea  Assess KUB  Ambulate with supervision, ok with Cardiology   FAMILY  - Updates:  Patient updated on plan of care    Noe Gens, NP-C [place] Pgr: [phone] or if no answer [phone] 01/24/2018, 9:22 AM

## 2018-01-24 NOTE — Progress Notes (Addendum)
Spoke with RN - pt with persistent frequent runs of ventricular ectopy.  Will leave amiodarone as is for another 6 hours prior to decreasing dose. If more VT overnight, can use Lidocaine as outlined in Dr Tanna Furry note earlier today. With persistent ST elevation, will tentatively plan relook cath for tomorrow morning - discussed with Dr Gwenlyn Found.   Chanetta Marshall, NP 01/24/2018 4:26 PM  EP attending  Agree with the findings above.  Continue amiodarone and if necessary use lidocaine.  Crissie Sickles, MD

## 2018-01-24 NOTE — Progress Notes (Signed)
ANTICOAGULATION CONSULT NOTE - Follow Up Consult  Pharmacy Consult for heparin Indication: atrial fibrillation  Labs: Recent Labs    01/22/18 1632 01/22/18 1934 01/23/18 0258 01/24/18 0004 01/24/18 1202 01/24/18 1927  HGB 12.2*  11.4*  --  9.9* 10.5*  --   --   HCT 36.0*  35.1*  --  30.3* 32.2*  --   --   PLT 227  --  207 272  --   --   APTT 33  --   --   --   --   --   LABPROT 15.0  --   --   --   --   --   INR 1.19  --   --   --   --   --   HEPARINUNFRC  --   --   --  0.12* 0.29* 0.41  CREATININE 1.39*  1.20  --  1.43* 1.64*  --  1.78*  TROPONINI 1.80* 2.75*  --   --   --  3.19*    Assessment: 84yo male therapeutic on heparin infusion. hgb 10.5, plts wnl.  Goal of Therapy:  Heparin level 0.3-0.7 units/ml   Plan:  Continue heparin at 1500 units/hr Daily HL, CBC Next level with AM labs   Harvel Quale 01/24/2018 9:00 PM

## 2018-01-24 NOTE — Progress Notes (Signed)
Progress Note  Patient Name: Daryl Rodriguez Date of Encounter: 01/24/2018  Primary Cardiologist: Shelva Majestic, MD   Subjective   Postop day 2 inferior STEMI.  The patient remains on 7 L of oxygen.  He denies chest pain.  He has remained hemodynamically stable but did go into A. fib with RVR yesterday and is somewhat better rate controlled on IV Cardizem.  He is also on IV heparin.  He does complain of some abdominal distention as well.  Inpatient Medications    Scheduled Meds: . aspirin EC  81 mg Oral Daily  . atorvastatin  80 mg Oral q1800  . docusate sodium  100 mg Oral Daily  . insulin aspart  2-6 Units Subcutaneous Q4H  . isosorbide mononitrate  30 mg Oral Daily  . metoprolol tartrate  50 mg Oral BID  . senna-docusate  2 tablet Oral BID  . sodium chloride flush  3 mL Intravenous Q12H  . ticagrelor  90 mg Oral BID   Continuous Infusions: . sodium chloride Stopped (01/23/18 1400)  . diltiazem (CARDIZEM) infusion 20 mg/hr (01/24/18 0532)  . heparin 1,400 Units/hr (01/24/18 0729)   PRN Meds: sodium chloride, acetaminophen, alum & mag hydroxide-simeth, loperamide, nitroGLYCERIN, ondansetron (ZOFRAN) IV, sodium chloride flush, zolpidem   Vital Signs    Vitals:   01/24/18 0630 01/24/18 0700 01/24/18 0731 01/24/18 0745  BP: (!) 131/98     Pulse: (!) 109 91    Resp: (!) 27 (!) 23    Temp:    98.2 F (36.8 C)  TempSrc:    Oral  SpO2: 94% 94% 95%   Weight:      Height:        Intake/Output Summary (Last 24 hours) at 01/24/2018 0924 Last data filed at 01/24/2018 0700 Gross per 24 hour  Intake 956.06 ml  Output 860 ml  Net 96.06 ml   Filed Weights   01/22/18 1830 01/22/18 1845  Weight: 180 lb (81.6 kg) 226 lb 3.1 oz (102.6 kg)    Telemetry    Atrial fibrillation with a ventricular response in the low 100 range- Personally Reviewed  ECG    Atrial fibrillation with a ventricular response of 116, residual inferior ST segment elevation.- Personally  Reviewed  Physical Exam   GEN: No acute distress.   Neck: No JVD Cardiac:  Irregularly irregular, no murmurs, rubs, or gallops.  Respiratory: Clear to auscultation bilaterally. GI: Soft, nontender, moderately distended MS: No edema; No deformity. Neuro:  Nonfocal  Psych: Normal affect  Extremities: Mild bruising right radial puncture site.  Mild pedal edema   Labs    Chemistry Recent Labs  Lab 01/22/18 1632 01/23/18 0258 01/24/18 0004  NA 135  135 137 137  K 4.4  4.3 4.1 4.5  CL 101  101 102 103  CO2 21* 25 22  GLUCOSE 163*  172* 148* 182*  BUN 28*  29* 30* 37*  CREATININE 1.39*  1.20 1.43* 1.64*  CALCIUM 8.9 8.6* 8.9  PROT 6.9 5.9*  --   ALBUMIN 3.0* 2.6*  --   AST 22 35  --   ALT 24 30  --   ALKPHOS 65 57  --   BILITOT 1.2 1.3*  --   GFRNONAA 46* 44* 38*  GFRAA 53* 51* 44*  ANIONGAP 13 10 12      Hematology Recent Labs  Lab 01/22/18 1632 01/23/18 0258 01/24/18 0004  WBC 11.8* 9.6 10.9*  RBC 3.66* 3.21* 3.35*  HGB 12.2*  11.4* 9.9* 10.5*  HCT 36.0*  35.1* 30.3* 32.2*  MCV 95.9 94.4 96.1  MCH 31.1 30.8 31.3  MCHC 32.5 32.7 32.6  RDW 13.9 13.8 14.0  PLT 227 207 272    Cardiac Enzymes Recent Labs  Lab 01/22/18 1632 01/22/18 1934  TROPONINI 1.80* 2.75*   No results for input(s): TROPIPOC in the last 168 hours.   BNP Recent Labs  Lab 01/22/18 1934  BNP 695.6*     DDimer No results for input(s): DDIMER in the last 168 hours.   Radiology    Dg Chest Port 1 View  Result Date: 01/22/2018 CLINICAL DATA:  Respiratory distress EXAM: PORTABLE CHEST 1 VIEW COMPARISON:  Chest x-ray 08/07/2015 FINDINGS: Cardiomegaly with vascular congestion. Multifocal perihilar airspace disease. Probable tiny effusions. Aortic atherosclerosis. No pneumothorax. IMPRESSION: Cardiomegaly with vascular congestion and tiny pleural effusions. Multifocal perihilar airspace disease may reflect pulmonary edema versus bilateral pneumonia. Electronically Signed   By: Donavan Foil M.D.   On: 01/22/2018 20:19    Cardiac Studies   Cardiac catheterization (01/22/2018)  Conclusion     Mid Cx lesion is 75% stenosed.  Prox LAD to Mid LAD lesion is 65% stenosed.  Dist RCA lesion is 20% stenosed.  Mid RCA lesion is 30% stenosed.  Prox RCA to Mid RCA lesion is 100% stenosed.  Post intervention, there is a 0% residual stenosis.  A stent was successfully placed.   Acute ST segment elevation myocardial infarction secondary to total occlusion of a calcified mid right coronary artery with thrombus formation with initial TIMI 0 flow.  Coronary calcification with multivessel CAD and additional 60-70% calcified stenosis of the mid LAD and 75% stenoses of the left circumflex vessel.  Difficult but successful percutaneous coronary intervention to the totally occluded calcified mid RCA with PTCA, and ultimate insertion of a 3.024 mm Synergy DES stent postdilated to 3.25 mm with the 100% occlusion being reduced to 0% and restoration of brisk TIMI-3 flow.  RECOMMENDATION: Dual antiplatelet therapy for minimum of 1 year and probably indefinitely.  Continue Aggrastat for 18 hours post procedure.  Initial medical therapy for concomitant CAD.  Due to continued hypoxemia, the patient may benefit from BiPAP therapy in the CCU with continued IV diuresis.  A 2-D echo Doppler study will be scheduled to assess LV function and valvular architecture.  High potency statin therapy, and guideline directed optimal post MI treatment.   2D echocardiogram (01/23/2018)  Study Conclusions  - Left ventricle: The cavity size was mildly dilated. Systolic   function was mildly to moderately reduced. The estimated ejection   fraction was in the range of 40% to 45%. Diffuse hypokinesis.   Doppler parameters are consistent with abnormal left ventricular   relaxation (grade 1 diastolic dysfunction). - Mitral valve: There was mild regurgitation. - Left atrium: The atrium was mildly  dilated. - Right ventricle: The cavity size was moderately dilated. Wall   thickness was normal. - Tricuspid valve: There was mild regurgitation. - Pulmonary arteries: Systolic pressure was mildly increased. PA   peak pressure: 35 mm Hg (S  Patient Profile     83 y.o. male with a history of hypertension, diabetes and hyperlipidemia but no prior cardiac history who had inferior STEMI 2 days ago treated with PCI and drug-eluting stenting via the right radial approach by Dr. Claiborne Billings with an excellent result.  The lesion was thrombotic.  He had moderate disease in his LAD and circumflex.  His troponin only increased to 2.75.  His EKG shows residual slight ST  segment elevation in inferior leads.  He did go into A. fib with RVR which has been rate controlled.  He is been placed on IV heparin.  He still requiring 7 L of oxygen with sats in the low 90 range.  He is also had some abdominal distention as well.  His serum creatinine has increased from 1.2 up to 1.6 with IV diuresis.  Assessment & Plan    1: Inferior STEMI- postop day 2 inferior STEMI treated with PCI and drug-eluting stenting of the mid RCA with a synergy 3 mm x 24 mm long drug-eluting stent.  His troponin only increased to 2.75.  He is on dual antiplatelet therapy.  A 2D echo revealed an ejection fraction of 40% with grade 1 diastolic dysfunction and a PA pressure 35 mmHg.  Marland Kitchen  Plan for medical treatment of his residual CAD  2: Essential hypertension- blood pressure controlled on metoprolol  3: Hyperlipidemia- on high-dose statin therapy  4: Hypoxemia- patient is relatively hypoxemic on 7 L of oxygen with a sat of 94%.  He did diurese half a liter on IV Lasix although his serum creatinine increased from 1.2->1.4-> 1.6  .  We will discontinue his diuretics.  Dr. Lake Bells is aware and following.  We will obtain a chest x-ray and abdominal flatplate.  There is a question of aspiration pneumonitis which will be addressed by pulmonary critical care.   The patient is afebrile although his white count is moderately elevated.  For questions or updates, please contact [facility] Please consult [email or web address] for contact info under Cardiology/STEMI.      Signed, Quay Burow, MD  01/24/2018, 9:24 AM

## 2018-01-24 NOTE — Progress Notes (Signed)
ANTICOAGULATION CONSULT NOTE - Follow Up Consult  Pharmacy Consult for heparin Indication: atrial fibrillation  Labs: Recent Labs    01/22/18 1632 01/22/18 1934 01/23/18 0258 01/24/18 0004 01/24/18 1202  HGB 12.2*  11.4*  --  9.9* 10.5*  --   HCT 36.0*  35.1*  --  30.3* 32.2*  --   PLT 227  --  207 272  --   APTT 33  --   --   --   --   LABPROT 15.0  --   --   --   --   INR 1.19  --   --   --   --   HEPARINUNFRC  --   --   --  0.12* 0.29*  CREATININE 1.39*  1.20  --  1.43* 1.64*  --   TROPONINI 1.80* 2.75*  --   --   --     Assessment: 84yo male subtherapeutic on heparin with initial dosing for Afib; RN notes no gtt issues or signs of bleeding. Heparin level was 0.29, slightly below goal. CBC stable.  Goal of Therapy:  Heparin level 0.3-0.7 units/ml   Plan:  Increase heparin gtt by 1 units/kg/hr to 1500 units/hr.   Check heparin level in 6 hours and daily while on heparin. Continue to monitor H&H and platelets. Continue to monitor for s/sx of bleeding.  Almira Bar, PharmD Student 01/24/2018,1:10 PM

## 2018-01-24 NOTE — Consult Note (Addendum)
ELECTROPHYSIOLOGY CONSULT NOTE    Patient ID: Daryl Rodriguez MRN: [id number], DOB/AGE: [birth year] 84 y.o.  Admit date: 01/22/2018 Date of Consult: 01/24/2018  Primary Physician: Nicoletta Dress, MD Primary Cardiologist: Claiborne Billings Electrophysiologist: Lovena Le (new this admission)  Patient Profile: Daryl Rodriguez is a 84 y.o. male with a history of CAD, hypertension, hyperlipidemia who is being seen today for the evaluation of AF and VT at the request of Dr Gwenlyn Found.  HPI:  Daryl Rodriguez is a 84 y.o. male with the above history who presented as an inferior STEMI on 01/22/18.  He underwent revascularization to mid RCA with DES.  Since intervention, he has been hypoxic and also has gone into AF and is having runs of VT.  EP has been asked to evaluate for treatment options.  He is also having diarrhea with abdominal distention.  NG tube is being placed today to decompress. He has not had recurrent chest pain or shortness of breath.   Echo this admission shows EF 40-45%, diffuse hypokinesis.   Past Medical History:  Diagnosis Date  . DM II (diabetes mellitus, type II), controlled ([place])   . Hyperlipidemia   . Hypertension      Surgical History:  Past Surgical History:  Procedure Laterality Date  . BACK SURGERY    . CORONARY STENT INTERVENTION N/A 01/22/2018   Procedure: CORONARY STENT INTERVENTION;  Surgeon: Troy Sine, MD;  Location: [facility];  Service: Cardiovascular;  Laterality: N/A;  . CORONARY/GRAFT ACUTE MI REVASCULARIZATION N/A 01/22/2018   Procedure: Coronary/Graft Acute MI Revascularization;  Surgeon: Troy Sine, MD;  Location: [facility];  Service: Cardiovascular;  Laterality: N/A;  . LEFT HEART CATH AND CORONARY ANGIOGRAPHY N/A 01/22/2018   Procedure: LEFT HEART CATH AND CORONARY ANGIOGRAPHY;  Surgeon: Troy Sine, MD;  Location: [facility];  Service: Cardiovascular;  Laterality: N/A;  . WRIST SURGERY       Medications Prior to Admission    Medication Sig Dispense Refill Last Dose  . amLODipine (NORVASC) 10 MG tablet Take 10 mg by mouth daily.   01/22/2018 at Unknown time  . ammonium lactate (LAC-HYDRIN) 12 % lotion Apply 1 application topically 2 (two) times daily. Arms legs and hands   unknown  . aspirin EC 81 MG tablet Take 81 mg by mouth daily.   01/22/2018 at 0700  . cloNIDine (CATAPRES) 0.2 MG tablet Take 0.2 mg by mouth 2 (two) times daily.   01/22/2018 at Unknown time  . gabapentin (NEURONTIN) 300 MG capsule Take 1 capsule by mouth 2 (two) times daily.  1 01/22/2018 at Unknown time  . glimepiride (AMARYL) 4 MG tablet Take 4 mg by mouth daily.   01/22/2018 at Unknown time  . latanoprost (XALATAN) 0.005 % ophthalmic solution Place 1 drop into both eyes at bedtime.   Past Week at Unknown time  . lisinopril-hydrochlorothiazide (PRINZIDE,ZESTORETIC) 20-12.5 MG tablet Take 2 tablets by mouth daily.   01/22/2018 at Unknown time  . metoprolol tartrate (LOPRESSOR) 50 MG tablet Take 50 mg by mouth 2 (two) times daily.   01/22/2018 at Unknown time  . simvastatin (ZOCOR) 20 MG tablet Take 20 mg by mouth daily.   01/22/2018 at Unknown time  . spironolactone (ALDACTONE) 25 MG tablet Take 25 mg by mouth daily.   01/22/2018 at Unknown time  . terazosin (HYTRIN) 1 MG capsule Take 1 mg by mouth daily.   01/22/2018 at Unknown time  . triamcinolone cream (KENALOG) 0.1 % Apply 1 application  topically as needed (Itchy skin).    unknown  . Vitamin D, Ergocalciferol, (DRISDOL) 50000 units CAPS capsule Take 50,000 Units by mouth once a week. Thursdays   01/18/2018    Inpatient Medications:  . aspirin EC  81 mg Oral Daily  . atorvastatin  80 mg Oral q1800  . docusate sodium  100 mg Oral Daily  . insulin aspart  2-6 Units Subcutaneous Q4H  . isosorbide mononitrate  30 mg Oral Daily  . metoCLOPramide (REGLAN) injection  5 mg Intravenous Q8H  . metoprolol tartrate  50 mg Oral BID  . senna-docusate  2 tablet Oral BID  . sodium chloride flush  3 mL  Intravenous Q12H  . ticagrelor  90 mg Oral BID    Allergies: No Known Allergies  Social History   Socioeconomic History  . Marital status: Married    Spouse name: Not on file  . Number of children: Not on file  . Years of education: Not on file  . Highest education level: Not on file  Occupational History  . Not on file  Social Needs  . Financial resource strain: Not on file  . Food insecurity:    Worry: Not on file    Inability: Not on file  . Transportation needs:    Medical: Not on file    Non-medical: Not on file  Tobacco Use  . Smoking status: Former Research scientist (life sciences)  . Smokeless tobacco: Never Used  . Tobacco comment: quit smoking 50 years ago  Substance and Sexual Activity  . Alcohol use: Not Currently  . Drug use: Not Currently  . Sexual activity: Not on file  Lifestyle  . Physical activity:    Days per week: Not on file    Minutes per session: Not on file  . Stress: Not on file  Relationships  . Social connections:    Talks on phone: Not on file    Gets together: Not on file    Attends religious service: Not on file    Active member of club or organization: Not on file    Attends meetings of clubs or organizations: Not on file    Relationship status: Not on file  . Intimate partner violence:    Fear of current or ex partner: Not on file    Emotionally abused: Not on file    Physically abused: Not on file    Forced sexual activity: Not on file  Other Topics Concern  . Not on file  Social History Narrative  . Not on file     Family History  Problem Relation Age of Onset  . Heart attack Father 20     Review of Systems: All other systems reviewed and are otherwise negative except as noted above.  Physical Exam: Vitals:   01/24/18 0800 01/24/18 0900 01/24/18 1000 01/24/18 1100  BP:  (!) 139/91 113/60 121/68  Pulse: (!) 121 65 72 97  Resp: (!) 26 (!) 28 17 (!) 27  Temp:      TempSrc:      SpO2: 96% 96% 96% 97%  Weight:      Height:        GEN- The  patient is elderly appearing, alert and oriented x 3 today.   HEENT: normocephalic, atraumatic; sclera clear, conjunctiva pink; hearing intact; oropharynx clear; neck supple Lungs- Clear to ausculation bilaterally, normal work of breathing.  No wheezes, rales, rhonchi Heart- Irregular rate and rhythm  GI- soft, non-tender, non-distended, bowel sounds present Extremities- no clubbing, cyanosis,  or edema  MS- no significant deformity or atrophy Skin- warm and dry, no rash or lesion Psych- euthymic mood, full affect Neuro- strength and sensation are intact  Labs:   Lab Results  Component Value Date   WBC 10.9 (H) 01/24/2018   HGB 10.5 (L) 01/24/2018   HCT 32.2 (L) 01/24/2018   MCV 96.1 01/24/2018   PLT 272 01/24/2018    Recent Labs  Lab 01/23/18 0258 01/24/18 0004  NA 137 137  K 4.1 4.5  CL 102 103  CO2 25 22  BUN 30* 37*  CREATININE 1.43* 1.64*  CALCIUM 8.6* 8.9  PROT 5.9*  --   BILITOT 1.3*  --   ALKPHOS 57  --   ALT 30  --   AST 35  --   GLUCOSE 148* 182*      Radiology/Studies: Dg Chest Port 1 View  Result Date: 01/24/2018 CLINICAL DATA:  Acute respiratory failure with hypoxia and dyspnea EXAM: PORTABLE CHEST 1 VIEW COMPARISON:  01/22/2018 chest radiograph. FINDINGS: Stable cardiomediastinal silhouette with mild cardiomegaly. No pneumothorax. No right pleural effusion. Small left pleural effusion appears increased. Mild hazy and linear parahilar lung opacities, improved in the interval. Patchy left lung base opacity with decreased lung volumes. IMPRESSION: 1. Mild congestive heart failure, improved. 2. Small left pleural effusion, increased. 3. Decreased lung volumes with patchy left lung base opacity, favor atelectasis. Electronically Signed   By: Ilona Sorrel M.D.   On: 01/24/2018 10:10   Dg Chest Port 1 View  Result Date: 01/22/2018 CLINICAL DATA:  Respiratory distress EXAM: PORTABLE CHEST 1 VIEW COMPARISON:  Chest x-ray 08/07/2015 FINDINGS: Cardiomegaly with  vascular congestion. Multifocal perihilar airspace disease. Probable tiny effusions. Aortic atherosclerosis. No pneumothorax. IMPRESSION: Cardiomegaly with vascular congestion and tiny pleural effusions. Multifocal perihilar airspace disease may reflect pulmonary edema versus bilateral pneumonia. Electronically Signed   By: Donavan Foil M.D.   On: 01/22/2018 20:19   Dg Abd Portable 1v  Result Date: 01/24/2018 CLINICAL DATA:  Abdominal pain and distention EXAM: PORTABLE ABDOMEN - 1 VIEW COMPARISON:  04/19/2011 CT abdomen/pelvis FINDINGS: Bilateral lumbar spinal fusion hardware overlies the lower lumbar spine. There is moderate gaseous distention of the stomach. No dilated small bowel loops. Mild stool and gas in the large bowel. No evidence of pneumatosis or pneumoperitoneum. Mild hazy left lung base opacity. No radiopaque nephrolithiasis. IMPRESSION: Nonspecific moderate gaseous distention of the stomach. No evidence of small or large bowel obstruction. Mild hazy left lung base opacity, please see the separate concurrent chest radiograph report for details. Electronically Signed   By: Ilona Sorrel M.D.   On: 01/24/2018 10:08    EKG:AF, rate 116, residual ST elevation in inferior leads  (personally reviewed)  TELEMETRY: AF, runs of RVR with aberration, runs of VT (personally reviewed)  Assessment/Plan: 1.  VT In the setting of recent intervention to RCA Continue IV Amiodarone - can rebolus as needed If he has persistent VT despite amiodarone re-load, can consider lidocaine Keep K>3.9, Mg >1.8 No driving x6 months  2.  CAD/Inferior STEMI S/p DES to RCA Management per primary team  3.  New onset atrial fibrillation In the setting of recent STEMI CHADS2VASC is 5 Will need anticoagulation Continue Heparin for now  4.  Acute respiratory failure with hypoxemia Per CCM  EP will follow with you    Signed, Chanetta Marshall, NP 01/24/2018 11:13 AM  EP Attending  Patient seen and examined.  Agree with the findings as noted above. He has an [employer]  rhythm and is sick appearing. There are bowel sounds present. Extremities demonstrate trace of edema and are warm. Abdomen is soft. He has both atrial fib and VT after his Inferior MI. No evidence of ongoing or new ischemia but always in the back ground. If while we are loading the amiodarone he has more VT, especially if sustained, he should be given IV lidocaine 100 mg bolus then 2-3 mg/min. Support/treatment of underlying GI issue might result in marked improvement of his arrhythmias.  [name].D.

## 2018-01-24 NOTE — Progress Notes (Signed)
Pt having multiple episodes of sustained Vtach, pt asymptomatic. MDs aware, pt on amiodarone and lidocaine gtts. Boluses given as ordered, pt has been shocked x 2 this shift. Will continue to closely monitor pt status.  Sherlie Ban, RN

## 2018-01-24 NOTE — Progress Notes (Signed)
Spoke w patient and granddaughter Daryl Rodriguez at bedside. Explained Brilinta card and how to use. Explained first month will be covered w 30 day free coupon. If deductible has not been met yet, it will be $393 the month after that. They understand to check with insurance on cost for next month and cost for once deductible is met. Granddaughter placed coupon in purse.

## 2018-01-24 NOTE — Progress Notes (Signed)
PT Cancellation Note  Patient Details Name: Daryl Rodriguez MRN: [id number] DOB: 30-Aug-1934   Cancelled Treatment:    Reason Eval/Treat Not Completed: Medical issues which prohibited therapy Attempted to evaluate  patient at 1200, RN asks PT checks back tomorrow,  as patient has been having runs of VT. Will cont to follow acutely.   Reinaldo Berber, PT, DPT Acute Rehab Services Pager: [phone]     Reinaldo Berber 01/24/2018, 12:20 PM

## 2018-01-24 NOTE — Progress Notes (Signed)
ANTICOAGULATION CONSULT NOTE - Follow Up Consult  Pharmacy Consult for heparin Indication: atrial fibrillation  Labs: Recent Labs    01/22/18 1632 01/22/18 1934 01/23/18 0258 01/24/18 0004  HGB 12.2*  11.4*  --  9.9* 10.5*  HCT 36.0*  35.1*  --  30.3* 32.2*  PLT 227  --  207 272  APTT 33  --   --   --   LABPROT 15.0  --   --   --   INR 1.19  --   --   --   HEPARINUNFRC  --   --   --  0.12*  CREATININE 1.39*  1.20  --  1.43* 1.64*  TROPONINI 1.80* 2.75*  --   --     Assessment: 83yo male subtherapeutic on heparin with initial dosing for Afib; RN notes no gtt issues or signs of bleeding.  Goal of Therapy:  Heparin level 0.3-0.7 units/ml   Plan:  Will rebolus with heparin 2000 units and increase heparin gtt by 3 units/kg/hr to 1400 units/hr and check level in 8 hours.    Wynona Neat, PharmD, BCPS  01/24/2018,1:40 AM

## 2018-01-25 ENCOUNTER — Inpatient Hospital Stay (HOSPITAL_COMMUNITY): Payer: Medicare Other

## 2018-01-25 ENCOUNTER — Encounter (HOSPITAL_COMMUNITY)
Admission: RE | Disposition: A | Discharge status: Home Health | Payer: Self-pay | Source: Ambulatory Visit | Attending: Cardiovascular Disease

## 2018-01-25 ENCOUNTER — Encounter (HOSPITAL_COMMUNITY): Payer: Self-pay | Admitting: Cardiovascular Disease

## 2018-01-25 HISTORY — PX: LEFT HEART CATH AND CORONARY ANGIOGRAPHY: CATH118249

## 2018-01-25 LAB — BASIC METABOLIC PANEL
ANION GAP: 13 (ref 5–15)
BUN: 60 mg/dL — ABNORMAL HIGH (ref 6–20)
CHLORIDE: 101 mmol/L (ref 101–111)
CO2: 23 mmol/L (ref 22–32)
Calcium: 9 mg/dL (ref 8.9–10.3)
Creatinine, Ser: 1.89 mg/dL — ABNORMAL HIGH (ref 0.61–1.24)
GFR calc non Af Amer: 32 mL/min — ABNORMAL LOW (ref >60)
GFR, EST AFRICAN AMERICAN: 37 mL/min — AB (ref >60)
GLUCOSE: 237 mg/dL — AB (ref 65–99)
Potassium: 5.3 mmol/L — ABNORMAL HIGH (ref 3.5–5.1)
Sodium: 137 mmol/L (ref 135–145)

## 2018-01-25 LAB — URINALYSIS, ROUTINE W REFLEX MICROSCOPIC
Bilirubin Urine: NEGATIVE
GLUCOSE, UA: NEGATIVE mg/dL
KETONES UR: NEGATIVE mg/dL
NITRITE: NEGATIVE
PROTEIN: NEGATIVE mg/dL
Specific Gravity, Urine: 1.027 (ref 1.005–1.030)
pH: 5 (ref 5.0–8.0)

## 2018-01-25 LAB — CBC
HCT: 45.8 % (ref 39.0–52.0)
HEMOGLOBIN: 15.4 g/dL (ref 13.0–17.0)
MCH: 31.2 pg (ref 26.0–34.0)
MCHC: 33.6 g/dL (ref 30.0–36.0)
MCV: 92.7 fL (ref 78.0–100.0)
Platelets: 146 10*3/uL — ABNORMAL LOW (ref 150–400)
RBC: 4.94 MIL/uL (ref 4.22–5.81)
RDW: 13.8 % (ref 11.5–15.5)
WBC: 5.5 10*3/uL (ref 4.0–10.5)

## 2018-01-25 LAB — GLUCOSE, CAPILLARY
Glucose-Capillary: 256 mg/dL — ABNORMAL HIGH (ref 65–99)
Glucose-Capillary: 208 mg/dL — ABNORMAL HIGH (ref 65–99)
Glucose-Capillary: 152 mg/dL — ABNORMAL HIGH (ref 65–99)
Glucose-Capillary: 147 mg/dL — ABNORMAL HIGH (ref 65–99)
Glucose-Capillary: 159 mg/dL — ABNORMAL HIGH (ref 65–99)

## 2018-01-25 LAB — CREATININE, URINE, RANDOM: Creatinine, Urine: 85.48 mg/dL

## 2018-01-25 LAB — SODIUM, URINE, RANDOM: Sodium, Ur: 35 mmol/L

## 2018-01-25 SURGERY — LEFT HEART CATH AND CORONARY ANGIOGRAPHY
Anesthesia: LOCAL

## 2018-01-25 MED ORDER — HEPARIN SODIUM (PORCINE) 1000 UNIT/ML IJ SOLN
INTRAMUSCULAR | Status: DC | PRN
  Administered 2018-01-25: 5000 [IU] via INTRAVENOUS

## 2018-01-25 MED ORDER — PANTOPRAZOLE SODIUM 40 MG PO TBEC
40.0000 mg | DELAYED_RELEASE_TABLET | Freq: Two times a day (BID) | ORAL | Status: DC
  Administered 2018-01-25 – 2018-01-28 (×8): 40 mg via ORAL
  Filled 2018-01-25 (×7): qty 1

## 2018-01-25 MED ORDER — SODIUM CHLORIDE 0.9% FLUSH: 3.0000 mL | INTRAVENOUS | Status: DC | PRN

## 2018-01-25 MED ORDER — LIDOCAINE HCL (PF) 1 % IJ SOLN
INTRAMUSCULAR | Status: DC | PRN
  Administered 2018-01-25: 2 mL via SUBCUTANEOUS

## 2018-01-25 MED ORDER — IOHEXOL 350 MG/ML SOLN
INTRAVENOUS | Status: DC | PRN
  Administered 2018-01-25: 40 mL via INTRA_ARTERIAL

## 2018-01-25 MED ORDER — PATIROMER SORBITEX CALCIUM 8.4 G PO PACK
8.4000 g | PACK | Freq: Every day | ORAL | Status: DC
  Filled 2018-01-25 (×2): qty 1

## 2018-01-25 MED ORDER — SODIUM CHLORIDE 0.9 % IV SOLN: 250.0000 mL | INTRAVENOUS | Status: DC | PRN

## 2018-01-25 MED ORDER — TICAGRELOR 90 MG PO TABS: 90.0000 mg | ORAL_TABLET | Freq: Two times a day (BID) | ORAL | Status: DC

## 2018-01-25 MED ORDER — HEPARIN (PORCINE) IN NACL 100-0.45 UNIT/ML-% IJ SOLN: 1500.0000 [IU]/h | INTRAMUSCULAR | Status: DC

## 2018-01-25 MED ORDER — VERAPAMIL HCL 2.5 MG/ML IV SOLN
INTRAVENOUS | Status: DC | PRN
  Administered 2018-01-25: 10 mL via INTRA_ARTERIAL

## 2018-01-25 MED ORDER — SODIUM CHLORIDE 0.9% FLUSH: 3.0000 mL | Freq: Two times a day (BID) | INTRAVENOUS | Status: DC

## 2018-01-25 MED ORDER — ZOLPIDEM TARTRATE 5 MG PO TABS
5.0000 mg | ORAL_TABLET | Freq: Every evening | ORAL | Status: DC | PRN
  Administered 2018-01-27 – 2018-01-31 (×6): 5 mg via ORAL
  Filled 2018-01-25 (×6): qty 1

## 2018-01-25 MED ORDER — HEPARIN SODIUM (PORCINE) 1000 UNIT/ML IJ SOLN
INTRAMUSCULAR | Status: AC
  Filled 2018-01-25: qty 1

## 2018-01-25 MED ORDER — LIDOCAINE HCL (PF) 1 % IJ SOLN
INTRAMUSCULAR | Status: AC
  Filled 2018-01-25: qty 30

## 2018-01-25 MED ORDER — VERAPAMIL HCL 2.5 MG/ML IV SOLN
INTRAVENOUS | Status: AC
  Filled 2018-01-25: qty 2

## 2018-01-25 MED ORDER — SODIUM CHLORIDE 0.9 % IV SOLN
INTRAVENOUS | Status: DC
  Administered 2018-01-25: 05:00:00 via INTRAVENOUS

## 2018-01-25 MED ORDER — ASPIRIN 81 MG PO CHEW
81.0000 mg | CHEWABLE_TABLET | ORAL | Status: AC
  Administered 2018-01-25: 81 mg via ORAL
  Filled 2018-01-25: qty 1

## 2018-01-25 MED ORDER — ASPIRIN 81 MG PO CHEW: 81.0000 mg | CHEWABLE_TABLET | ORAL | Status: DC

## 2018-01-25 MED ORDER — HEPARIN (PORCINE) IN NACL 2-0.9 UNITS/ML
INTRAMUSCULAR | Status: AC | PRN
  Administered 2018-01-25 (×2): 500 mL

## 2018-01-25 MED ORDER — PROCHLORPERAZINE EDISYLATE 10 MG/2ML IJ SOLN
10.0000 mg | Freq: Four times a day (QID) | INTRAMUSCULAR | Status: DC | PRN
  Administered 2018-01-25: 10 mg via INTRAVENOUS
  Filled 2018-01-25: qty 2

## 2018-01-25 MED ORDER — SODIUM CHLORIDE 0.9% FLUSH
3.0000 mL | INTRAVENOUS | Status: DC | PRN
Start: 2018-01-25 — End: 2018-01-25

## 2018-01-25 SURGICAL SUPPLY — 12 items
CATH INFINITI 5 FR JL3.5 (CATHETERS) ×1 IMPLANT
CATH INFINITI 5FR JK (CATHETERS) ×1 IMPLANT
DEVICE RAD COMP TR BAND LRG (VASCULAR PRODUCTS) ×1 IMPLANT
ELECT DEFIB PAD ADLT CADENCE (PAD) ×1 IMPLANT
GLIDESHEATH SLEND SS 6F .021 (SHEATH) ×1 IMPLANT
GUIDEWIRE INQWIRE 1.5J.035X260 (WIRE) IMPLANT
INQWIRE 1.5J .035X260CM (WIRE) ×2
KIT HEART LEFT (KITS) ×2 IMPLANT
PACK CARDIAC CATHETERIZATION (CUSTOM PROCEDURE TRAY) ×2 IMPLANT
TRANSDUCER W/STOPCOCK (MISCELLANEOUS) ×2 IMPLANT
TUBING CIL FLEX 10 FLL-RA (TUBING) ×2 IMPLANT
WIRE ROSEN-J .035X260CM (WIRE) ×2 IMPLANT

## 2018-01-25 NOTE — Progress Notes (Signed)
Pt with mild nosebleed, drainage controlled with kleenex. Bleeding stopped at this time. Will continue to monitor.  Sherlie Ban, RN

## 2018-01-25 NOTE — Progress Notes (Addendum)
PULMONARY / CRITICAL CARE MEDICINE   Name: Daryl Rodriguez MRN: [id number] DOB: 1934/07/30    ADMISSION DATE:  01/22/2018 CONSULTATION DATE:  01/22/2018  REFERRING MD:  Claiborne Billings  CHIEF COMPLAINT:  dyspnea  HISTORY OF PRESENT ILLNESS:   84 y/o male admitted on 5/20 with a STEMI, had acute pulmonary edema and acute respiratory failure with hypoxemia.   SUBJECTIVE:  No further emesis reported Has been lethargic last 12 hours Note net positive 450cc Back to the cath lab this am after he experienced VT   VITAL SIGNS: BP 131/61 (BP Location: Left Arm)   Pulse 80   Temp 97.7 F (36.5 C) (Oral)   Resp 20   Ht 5\' 9"  (1.753 m)   Wt 95.2 kg (209 lb 14.1 oz)   SpO2 97%   BMI 30.99 kg/m   HEMODYNAMICS:    VENTILATOR SETTINGS:    INTAKE / OUTPUT: I/O last 3 completed shifts: In: 3141.5 [P.O.:180; I.V.:2911.5; NG/GT:50] Out: 2005 [Urine:1405; Emesis/NG output:600]  PHYSICAL EXAMINATION: General:  Ill appearing man, comfortable HEENT: Oropharynx clear, he did bring up (coughed or emesis?)  blood clot this a.m. Neuro: Awake, appropriate, answers all questions, nonfocal CV: Regular, no murmur PULM: Clear bilaterally QA:STMH, non-distended, NGT in place, + BS Extremities:1+ LE pitting edema  Skin: no rash  LABS:  BMET Recent Labs  Lab 01/24/18 0004 01/24/18 1927 01/25/18 0233  NA 137 137 137  K 4.5 3.8 5.3*  CL 103 99* 101  CO2 22 23 23   BUN 37* 52* 60*  CREATININE 1.64* 1.78* 1.89*  GLUCOSE 182* 244* 237*    Electrolytes Recent Labs  Lab 01/24/18 0004 01/24/18 1202 01/24/18 1927 01/25/18 0233  CALCIUM 8.9  --  9.1 9.0  MG  --  2.1 2.0  --     CBC Recent Labs  Lab 01/23/18 0258 01/24/18 0004 01/25/18 0233  WBC 9.6 10.9* 5.5  HGB 9.9* 10.5* 15.4  HCT 30.3* 32.2* 45.8  PLT 207 272 146*    Coag's Recent Labs  Lab 01/22/18 1632  APTT 33  INR 1.19    Sepsis Markers No results for input(s): LATICACIDVEN, PROCALCITON, O2SATVEN in the last 168  hours.  ABG Recent Labs  Lab 01/22/18 1645  PHART 7.330*  PCO2ART 39.8  PO2ART 53.0*    Liver Enzymes Recent Labs  Lab 01/22/18 1632 01/23/18 0258  AST 22 35  ALT 24 30  ALKPHOS 65 57  BILITOT 1.2 1.3*  ALBUMIN 3.0* 2.6*    Cardiac Enzymes Recent Labs  Lab 01/22/18 1632 01/22/18 1934 01/24/18 1927  TROPONINI 1.80* 2.75* 3.19*    Glucose Recent Labs  Lab 01/24/18 1209 01/24/18 1622 01/24/18 2057 01/24/18 2336 01/25/18 0311 01/25/18 0849  GLUCAP 185* 223* 256* 268* 208* 152*    Imaging Dg Chest Port 1 View  Result Date: 01/24/2018 CLINICAL DATA:  Acute respiratory failure with hypoxia and dyspnea EXAM: PORTABLE CHEST 1 VIEW COMPARISON:  01/22/2018 chest radiograph. FINDINGS: Stable cardiomediastinal silhouette with mild cardiomegaly. No pneumothorax. No right pleural effusion. Small left pleural effusion appears increased. Mild hazy and linear parahilar lung opacities, improved in the interval. Patchy left lung base opacity with decreased lung volumes. IMPRESSION: 1. Mild congestive heart failure, improved. 2. Small left pleural effusion, increased. 3. Decreased lung volumes with patchy left lung base opacity, favor atelectasis. Electronically Signed   By: Ilona Sorrel M.D.   On: 01/24/2018 10:10   Dg Abd Portable 1v  Result Date: 01/24/2018 CLINICAL DATA:  Check nasogastric catheter placement  EXAM: PORTABLE ABDOMEN - 1 VIEW COMPARISON:  01/24/2018 FINDINGS: Nasogastric catheter is now noted coiled within the stomach. The stomach is decompressed when compared with the previous exam. Postsurgical changes are again noted. IMPRESSION: Nasogastric catheter within the stomach. Electronically Signed   By: Inez Catalina M.D.   On: 01/24/2018 11:52   Dg Abd Portable 1v  Result Date: 01/24/2018 CLINICAL DATA:  Abdominal pain and distention EXAM: PORTABLE ABDOMEN - 1 VIEW COMPARISON:  04/19/2011 CT abdomen/pelvis FINDINGS: Bilateral lumbar spinal fusion hardware overlies the  lower lumbar spine. There is moderate gaseous distention of the stomach. No dilated small bowel loops. Mild stool and gas in the large bowel. No evidence of pneumatosis or pneumoperitoneum. Mild hazy left lung base opacity. No radiopaque nephrolithiasis. IMPRESSION: Nonspecific moderate gaseous distention of the stomach. No evidence of small or large bowel obstruction. Mild hazy left lung base opacity, please see the separate concurrent chest radiograph report for details. Electronically Signed   By: Ilona Sorrel M.D.   On: 01/24/2018 10:08     STUDIES:  5/20 LHC > RCA occlusion, PCI 5/21 ECHO > LVEF 40-45%, diffuse hypokinesis, grade 1 diastolic dysfunction, PA peak 68mmHg  CULTURES:   ANTIBIOTICS:   SIGNIFICANT EVENTS: 5/20 Admit with chest pain, LHC, BIPAP  LINES/TUBES:   DISCUSSION: 84 y/o male with acute pulmonary edema after a STEMI.  Course complicated by wide-complex tachycardia, cardioverted x2 on 5/22  ASSESSMENT / PLAN:  PULMONARY A: Acute respiratory faliure with hypoxemia Acute pulmonary edema N/V Episode on BiPAP  OSA - on CPAP at home P:   Push pulm hygiene Agree with deferring diuresis given rising S Cr Follow CXR 5/24 Continue CPAP qhs Wean O2 as able MOBILIZE\    CARDIOVASCULAR A:  STEMI VT versus SVT Atrial fibrillation, now in sinus rhythm P:  ASA, brilinta, metoprolol, , lipitor, Imdur Back to Cath Lab on 5/23 morning, no acute obstruction, no intervention required. Amiodarone /lidocaine as ordered Anticoagulation Wean dilt gtt as able   RENAL A:   Mild renal insufficiency - 2016 sr cr 0.9-1 P:   Diuresis on hold with rising S Cr Follow BMP, UOP Replace electrolytes when indicated Avoid nephrotoxic agents, ensure adequate renal perfusion  GI  A: Abdominal Discomfort, Diarrhea- r/ule out ileus Prior N/V P: Decompressed with NGT effectively  Continue colace, senokot for now Hold above for diarrhea   NEURO A: Toxic-metabolic  encephalopathy, improved P: Minimize sedating meds.   FAMILY  - Updates:  Patient updated on plan of care 5/23  Baltazar Apo, MD, PhD 01/25/2018, 10:03 AM [facility] [phone] or if no answer [phone]

## 2018-01-25 NOTE — Progress Notes (Signed)
Pharmacist Heart Failure Core Measure Documentation  Assessment: Daryl Rodriguez has an EF documented as 40-45% on 01/23/18  by ECHO  Rationale: Heart failure patients with left ventricular systolic dysfunction (LVSD) and an EF < 40% should be prescribed an angiotensin converting enzyme inhibitor (ACEI) or angiotensin receptor blocker (ARB) at discharge unless a contraindication is documented in the medical record.  This patient is not currently on an ACEI or ARB for HF.  This note is being placed in the record in order to provide documentation that a contraindication to the use of these agents is present for this encounter.  ACE Inhibitor or Angiotensin Receptor Blocker is contraindicated (specify all that apply)  []   ACEI allergy AND ARB allergy []   Angioedema []   Moderate or severe aortic stenosis []   Hyperkalemia []   Hypotension []   Renal artery stenosis [x]   Worsening renal function, preexisting renal disease or dysfunction   Bonnita Nasuti Pharm.D. CPP, BCPS Clinical Pharmacist [phone] 01/25/2018 10:14 AM

## 2018-01-25 NOTE — Progress Notes (Addendum)
ANTICOAGULATION CONSULT NOTE - Follow Up Consult  Pharmacy Consult for heparin Indication: atrial fibrillation  Labs: Recent Labs    01/22/18 1632 01/22/18 1934 01/23/18 0258 01/24/18 0004 01/24/18 1202 01/24/18 1927 01/25/18 0233  HGB 12.2*  11.4*  --  9.9* 10.5*  --   --  15.4  HCT 36.0*  35.1*  --  30.3* 32.2*  --   --  45.8  PLT 227  --  207 272  --   --  146*  APTT 33  --   --   --   --   --   --   LABPROT 15.0  --   --   --   --   --   --   INR 1.19  --   --   --   --   --   --   HEPARINUNFRC  --   --   --  0.12* 0.29* 0.41  --   CREATININE 1.39*  1.20  --  1.43* 1.64*  --  1.78* 1.89*  TROPONINI 1.80* 2.75*  --   --   --  3.19*  --     Assessment: 84yo male being treated with heparin for afib. Had numerous runs of VT yesterday, subsequently having a code blue called where there was positive LOC but pulse maintained- started on lidocaine. Went to cath lab this morning and cath report showing patent stents with no significant restenosis (unchanged borderline significant CAD affecting LAD and LCx). Plan to resume heparin infusion 8 hours after sheath removal (5/23 at 0823).  Hgb is 15.4, plt 146 today. No signs/symptoms of bleeding.   Goal of Therapy:  Heparin level 0.3-0.7 units/ml   Plan:  Restart heparin infusion at 1500 units/hr today at 1630 Daily HL, CBC Follow up plan for anticoagulation  Doylene Canard, PharmD Clinical Pharmacist  Pager: [phone] Phone: [phone] 01/25/2018 8:41 AM   ADDENDUM Discussed with MD about heparin infusion. Had episode of coughing up blood overnight while on heparin infusion. Rhythm changed to NSR. Given episode of hemoptysis, now in NSR, and had developed new onset Afib in setting of recent MI, will hold on heparin infusion at this time. Other consult teams are aware of plan.   Doylene Canard, PharmD Clinical Pharmacist  Pager: [phone] Phone: [phone]

## 2018-01-25 NOTE — Plan of Care (Signed)
Pt sent to cath lab via bed.

## 2018-01-25 NOTE — Care Management Note (Signed)
Case Management Note Marvetta Gibbons RN,BSN [facility] 1-22 Case Manager  [phone]  Patient Details  Name: Salil Raineri MRN: [id number] Date of Birth: 1933/12/31  Subjective/Objective:   Pt admitted with STEMI s/p DES, developed acute pulmonary edema and acute respirtory failure with hypoxemia.  Abd discomfort- decompressed with NGT               Action/Plan: PTA pt lived at home, has been started on Brilinta- per insurance check- copay cost [phone]. 31 has pt has not met deductible yet- CM has spoken with pt and family regarding copay cost/decutible and provided 30 day free card to use on discharge. PT eval pending for recommendations- CM will follow for transition of care needs.   Expected Discharge Date:                  Expected Discharge Plan:     In-House Referral:     Discharge planning Services  CM Consult, Medication Assistance  Post Acute Care Choice:    Choice offered to:     DME Arranged:    DME Agency:     HH Arranged:    HH Agency:     Status of Service:  In process, will continue to follow  If discussed at Long Length of Stay Meetings, dates discussed:    Discharge Disposition:   Additional Comments:  Dawayne Patricia, RN 01/25/2018, 10:31 AM

## 2018-01-25 NOTE — Consult Note (Signed)
Reason for Consult: Acute kidney injury on chronic kidney disease stage III Referring Physician: Quay Burow MD (Cardiology)  HPI:  84 year old Caucasian man with past medical history significant for hypertension, dyslipidemia, type 2 diabetes mellitus, obesity, obstructive sleep apnea on CPAP nightly, peripheral vascular disease and baseline chronic kidney disease stage III (creatinine 1.2-1.3).  Presented to the emergency room with increasing shortness of breath and lower extremity edema for the past 2 weeks and  more recently developing chest pain with indigestion symptoms which upon evaluation in the emergency room showed EKG changes with persistent ST elevation of leads III and aVF for which he was taken emergently to the Cath Lab with stent placement in the RCA 3 days ago and overnight developed ventricular tachycardia requiring cardioversion and repeat coronary angiogram that showed unchanged anatomy with elevated LVEDP.  Concern is raised with his rising creatinine which has gradually trended up to 1.9 in the setting of increased diuretic therapy.  Based on his volume assessment, diuretics have been held this morning by cardiology.  He is high risk for worsening renal function with contrast exposure and possible intravascular volume depletion.  He currently reports to be feeling well and with significantly improved shortness of breath.  He denies any nausea or  diarrhea but had an episode of postprandial emesis yesterday.  He denies any prior history of acute kidney injury, kidney stones or recurrent urinary tract infections.  He denies any regular use of nonsteroidal anti-inflammatory drugs and was taking lisinopril prior to admission.  Past Medical History:  Diagnosis Date  . DM II (diabetes mellitus, type II), controlled ([place])   . Hyperlipidemia   . Hypertension     Past Surgical History:  Procedure Laterality Date  . BACK SURGERY    . CORONARY STENT INTERVENTION N/A 01/22/2018    Procedure: CORONARY STENT INTERVENTION;  Surgeon: Troy Sine, MD;  Location: [place] [facility];  Service: Cardiovascular;  Laterality: N/A;  . CORONARY/GRAFT ACUTE MI REVASCULARIZATION N/A 01/22/2018   Procedure: Coronary/Graft Acute MI Revascularization;  Surgeon: Troy Sine, MD;  Location: [place] [facility];  Service: Cardiovascular;  Laterality: N/A;  . LEFT HEART CATH AND CORONARY ANGIOGRAPHY N/A 01/22/2018   Procedure: LEFT HEART CATH AND CORONARY ANGIOGRAPHY;  Surgeon: Troy Sine, MD;  Location: [place] [facility];  Service: Cardiovascular;  Laterality: N/A;  . LEFT HEART CATH AND CORONARY ANGIOGRAPHY N/A 01/25/2018   Procedure: LEFT HEART CATH AND CORONARY ANGIOGRAPHY - RELOOK;  Surgeon: Wellington Hampshire, MD;  Location: [place] [facility];  Service: Cardiovascular;  Laterality: N/A;  . WRIST SURGERY      Family History  Problem Relation Age of Onset  . Heart attack Father 82    Social History:  reports that he has quit smoking. He has never used smokeless tobacco. He reports that he drank alcohol. He reports that he has current or past drug history.  Allergies: No Known Allergies  Medications:  Scheduled: . aspirin EC  81 mg Oral Daily  . atorvastatin  80 mg Oral q1800  . docusate sodium  100 mg Oral Daily  . insulin aspart  2-6 Units Subcutaneous Q4H  . isosorbide mononitrate  30 mg Oral Daily  . mouth rinse  15 mL Mouth Rinse BID  . metoprolol tartrate  5 mg Intravenous Q6H  . pantoprazole  40 mg Oral BID  . patiromer  8.4 g Oral Daily  . senna-docusate  2 tablet Oral BID  . sodium chloride flush  3 mL Intravenous Q12H  . sodium chloride flush  3 mL Intravenous Q12H  . ticagrelor  90 mg Oral BID    BMP Latest Ref Rng & Units 01/25/2018 01/24/2018 01/24/2018  Glucose 65 - 99 mg/dL 237(H) 244(H) 182(H)  BUN 6 - 20 mg/dL 60(H) 52(H) 37(H)  Creatinine 0.61 - 1.24 mg/dL 1.89(H) 1.78(H) 1.64(H)  Sodium 135 - 145 mmol/L 137 137 137  Potassium 3.5 - 5.1 mmol/L  5.3(H) 3.8 4.5  Chloride 101 - 111 mmol/L 101 99(L) 103  CO2 22 - 32 mmol/L 23 23 22   Calcium 8.9 - 10.3 mg/dL 9.0 9.1 8.9   CBC Latest Ref Rng & Units 01/25/2018 01/24/2018 01/23/2018  WBC 4.0 - 10.5 K/uL 5.5 10.9(H) 9.6  Hemoglobin 13.0 - 17.0 g/dL 15.4 10.5(L) 9.9(L)  Hematocrit 39.0 - 52.0 % 45.8 32.2(L) 30.3(L)  Platelets 150 - 400 K/uL 146(L) 272 207     Dg Chest Port 1 View  Result Date: 01/24/2018 CLINICAL DATA:  Acute respiratory failure with hypoxia and dyspnea EXAM: PORTABLE CHEST 1 VIEW COMPARISON:  01/22/2018 chest radiograph. FINDINGS: Stable cardiomediastinal silhouette with mild cardiomegaly. No pneumothorax. No right pleural effusion. Small left pleural effusion appears increased. Mild hazy and linear parahilar lung opacities, improved in the interval. Patchy left lung base opacity with decreased lung volumes. IMPRESSION: 1. Mild congestive heart failure, improved. 2. Small left pleural effusion, increased. 3. Decreased lung volumes with patchy left lung base opacity, favor atelectasis. Electronically Signed   By: Ilona Sorrel M.D.   On: 01/24/2018 10:10   Dg Abd Portable 1v  Result Date: 01/24/2018 CLINICAL DATA:  Check nasogastric catheter placement EXAM: PORTABLE ABDOMEN - 1 VIEW COMPARISON:  01/24/2018 FINDINGS: Nasogastric catheter is now noted coiled within the stomach. The stomach is decompressed when compared with the previous exam. Postsurgical changes are again noted. IMPRESSION: Nasogastric catheter within the stomach. Electronically Signed   By: Inez Catalina M.D.   On: 01/24/2018 11:52   Dg Abd Portable 1v  Result Date: 01/24/2018 CLINICAL DATA:  Abdominal pain and distention EXAM: PORTABLE ABDOMEN - 1 VIEW COMPARISON:  04/19/2011 CT abdomen/pelvis FINDINGS: Bilateral lumbar spinal fusion hardware overlies the lower lumbar spine. There is moderate gaseous distention of the stomach. No dilated small bowel loops. Mild stool and gas in the large bowel. No evidence of  pneumatosis or pneumoperitoneum. Mild hazy left lung base opacity. No radiopaque nephrolithiasis. IMPRESSION: Nonspecific moderate gaseous distention of the stomach. No evidence of small or large bowel obstruction. Mild hazy left lung base opacity, please see the separate concurrent chest radiograph report for details. Electronically Signed   By: Ilona Sorrel M.D.   On: 01/24/2018 10:08    Review of Systems  Constitutional: Negative.   HENT: Negative.   Eyes: Negative.   Respiratory: Positive for shortness of breath. Negative for cough, hemoptysis and sputum production.        Significantly improved shortness of breath  Cardiovascular: Negative.   Gastrointestinal: Positive for vomiting.       1 isolated event of vomiting overnight  Genitourinary: Negative.        Reports nocturia prior to presentation with sensation of incomplete bladder voiding  Musculoskeletal: Negative.   Skin: Negative.    Blood pressure 126/60, pulse 83, temperature 97.9 F (36.6 C), temperature source Oral, resp. rate 12, height 5\' 9"  (1.753 m), weight 95.2 kg (209 lb 14.1 oz), SpO2 98 %. Physical Exam  Nursing note and vitals reviewed. Constitutional: He is oriented to person, place,  and time. He appears well-developed and well-nourished. No distress.  HENT:  Head: Normocephalic and atraumatic.  Mouth/Throat: Oropharynx is clear and moist.  On oxygen via nasal cannula  Eyes: Pupils are equal, round, and reactive to light. Conjunctivae are normal. No scleral icterus.  Neck: Normal range of motion. No JVD present. No thyromegaly present.  Cardiovascular: Normal rate, regular rhythm and normal heart sounds.  No murmur heard. Respiratory: Effort normal and breath sounds normal. He has no wheezes. He has no rales.  GI: Bowel sounds are normal. There is no tenderness. There is no rebound and no guarding.  Musculoskeletal: He exhibits no edema.  Neurological: He is alert and oriented to person, place, and time.   Skin: Skin is warm and dry. No rash noted.    Assessment/Plan: 1.  Acute kidney injury on chronic kidney disease stage III: This appears to be by factorial-hemodynamically mediated with STEMI/renal hypoperfusion and compounded by recent requirement for diuresis and iodinated intravenous contrast.  Suspect that he developed some volume contraction especially looking at his hemoglobin dry/BUN elevation.  Will check renal ultrasound given prior history of urinary hesitancy and will check urine electrolytes.  Agree with discontinuation of diuretics at this time given paucity of edema and improvement of respiratory status/chest x-ray findings.  Will continue close monitoring given high risk for worsening renal function.  He does not have any acute electrolyte abnormalities and mild hyperkalemia has been treated with Veltassa.  No uremic signs or symptoms.  No indications for dialysis. 2.  Inferior ST elevation MI: Status post PCI with drug-eluting stent to RCA 3 days ago and postprocedure phase complicated by sustained ventricular tachycardia treated with cardioversion and repeat angiogram.  Continue medical management per cardiology-avoid ACE inhibitor at this time. 3.  Hypertension: Blood pressures initially in flux, improved with diuresis and metoprolol. 4.  Atrial fibrillation: Currently back in sinus rhythm, previously with A. fib with RVR. 5.  Hypoxemia: Prior history of obstructive sleep apnea on CPAP-status post aggressive diuretic therapy/volume unloading but remains on supplemental oxygen via nasal cannula.  Samanth Mirkin K. 01/25/2018, 2:31 PM

## 2018-01-25 NOTE — Progress Notes (Signed)
Inpatient Diabetes Program Recommendations  AACE/ADA: New Consensus Statement on Inpatient Glycemic Control (2015)  Target Ranges:  Prepandial:   less than 140 mg/dL      Peak postprandial:   less than 180 mg/dL (1-2 hours)      Critically ill patients:  140 - 180 mg/dL   Lab Results  Component Value Date   GLUCAP 152 (H) 01/25/2018   HGBA1C 6.6 (H) 01/22/2018    Review of Glycemic Control Results for Daryl Rodriguez, Daryl Rodriguez (MRN [id number]) as of 01/25/2018 11:38  Ref. Range 01/24/2018 20:57 01/24/2018 23:36 01/25/2018 03:11 01/25/2018 08:49  Glucose-Capillary Latest Ref Range: 65 - 99 mg/dL 256 (H) 268 (H) 208 (H) 152 (H)   Diabetes history: Type 2 DM Outpatient Diabetes medications: Amaryl 4 mg QD Current orders for Inpatient glycemic control: Novolog 2-6 units Q4H  Inpatient Diabetes Program Recommendations:    Of note, patient has received 32 units of short acting insulin in 24 hours. May want to consider Lantus 14 units QD.   Now that patient is no longer NPO, consider changing correction to Novolog 0-9 units TID + HS.   Thanks, Bronson Curb, MSN, RNC-OB Diabetes Coordinator [phone] (8a-5p)

## 2018-01-25 NOTE — Evaluation (Addendum)
Physical Therapy Evaluation Patient Details Name: Daryl Rodriguez MRN: [id number] DOB: [birth year] Today's Date: 01/25/2018   History of Present Illness  84 y.o. admitted with SOB and BLE edema for past 2 weeks with chest pain. Stent placement in RCA 5/20, 5/22 devolved v tach requiring cardioversion 5/23. PMH includes: HTN, DM2, CKD III, Peripheral vascular disease, Wrist surgery, back surgery.     Clinical Impression  Patient is s/p above surgery resulting in functional limitations due to the deficits listed below (see PT Problem List). PTA, pt living at home, independent with ADL and household ambulation. Patient has son and daughter in area, and wife who has recently returned home from SNF recovering from hip fx that pt and family would like to have together at home. Upon eval pt is moving well, OOB and walking short distance to chair with min A  x1 for transfers and min guard level for ambulation. Daughter present and reports she and family will be avail 24/7 after d/c to help if patient needs supervision and light assistance, which I'd recommend for safety.  Patient will benefit from skilled PT to increase their independence and safety with mobility to allow discharge to the venue listed below.    Vitals: Rest: 134/98  HR 93 SpO2 95% 4L EOB:  BP 125/59 HR 100 After Activity (10' walking to chair): BP 143/68  HR 95, SpO2 95% 4L    Follow Up Recommendations Home health PT;Supervision/Assistance - 24 hour    Equipment Recommendations  (TBD)    Recommendations for Other Services OT consult     Precautions / Restrictions Precautions Precautions: Fall Precaution Comments: V Tach 5/22  Restrictions Weight Bearing Restrictions: No      Mobility  Bed Mobility Overal bed mobility: Needs Assistance Bed Mobility: Supine to Sit     Supine to sit: Min assist     General bed mobility comments: Min A to help power trunk up and work patient into position EOB.   Transfers Overall  transfer level: Needs assistance Equipment used: Rolling walker (2 wheeled) Transfers: Sit to/from Stand Sit to Stand: Min assist         General transfer comment: Min A to help power up into RW. cues for hand placement.   Ambulation/Gait Ambulation/Gait assistance: Min guard Ambulation Distance (Feet): 10 Feet Assistive device: Rolling walker (2 wheeled) Gait Pattern/deviations: Step-to pattern Gait velocity: decreased   General Gait Details: ambulated from bed across room to bedside chair. RW, min guard for safety.   Stairs            Wheelchair Mobility    Modified Rankin (Stroke Patients Only)       Balance Overall balance assessment: Needs assistance   Sitting balance-Leahy Scale: Fair       Standing balance-Leahy Scale: Fair Standing balance comment: RW for dynamic tasks                             Pertinent Vitals/Pain Pain Assessment: No/denies pain    Home Living Family/patient expects to be discharged to:: Private residence Living Arrangements: Spouse/significant other Available Help at Discharge: Family   Home Access: Stairs to enter Entrance Stairs-Rails: Can reach both Entrance Stairs-Number of Steps: 2 Home Layout: One level Home Equipment: Walker - 2 wheels;Grab bars - toilet;Shower seat      Prior Function Level of Independence: Independent         Comments: limited hosuehold ambulation. son  takes care of  some errands.      Hand Dominance        Extremity/Trunk Assessment   Upper Extremity Assessment Upper Extremity Assessment: Overall WFL for tasks assessed;Defer to OT evaluation    Lower Extremity Assessment Lower Extremity Assessment: (BLE strength 4-/5)       Communication   Communication: No difficulties  Cognition Arousal/Alertness: Awake/alert Behavior During Therapy: WFL for tasks assessed/performed Overall Cognitive Status: Within Functional Limits for tasks assessed                                         General Comments      Exercises General Exercises - Lower Extremity Ankle Circles/Pumps: 20 reps Quad Sets: 10 reps Hip ABduction/ADduction: 10 reps   Assessment/Plan    PT Assessment Patient needs continued PT services  PT Problem List Decreased strength;Decreased range of motion;Decreased activity tolerance;Decreased balance;Pain;Cardiopulmonary status limiting activity       PT Treatment Interventions DME instruction;Gait training;Stair training;Functional mobility training;Therapeutic activities;Therapeutic exercise    PT Goals (Current goals can be found in the Care Plan section)  Acute Rehab PT Goals Patient Stated Goal: return home with HHPT PT Goal Formulation: With patient/family Time For Goal Achievement: 02/08/18 Potential to Achieve Goals: Good    Frequency Min 3X/week   Barriers to discharge        Co-evaluation               AM-PAC PT "6 Clicks" Daily Activity  Outcome Measure Difficulty turning over in bed (including adjusting bedclothes, sheets and blankets)?: A Lot Difficulty moving from lying on back to sitting on the side of the bed? : A Lot Difficulty sitting down on and standing up from a chair with arms (e.g., wheelchair, bedside commode, etc,.)?: Unable Help needed moving to and from a bed to chair (including a wheelchair)?: A Little Help needed walking in hospital room?: A Little Help needed climbing 3-5 steps with a railing? : A Lot 6 Click Score: 13    End of Session Equipment Utilized During Treatment: Gait belt;Oxygen(4L ) Activity Tolerance: Patient tolerated treatment well Patient left: in chair;with family/visitor present;with call bell/phone within reach Nurse Communication: Mobility status PT Visit Diagnosis: Unsteadiness on feet (R26.81);Other abnormalities of gait and mobility (R26.89)    Time: 1550-1630 PT Time Calculation (min) (ACUTE ONLY): 40 min   Charges:   PT Evaluation $PT Eval  Low Complexity: 1 Low PT Treatments $Gait Training: 8-22 mins   PT G Codes:        Reinaldo Berber, PT, DPT Acute Rehab Services Pager: [phone]    Reinaldo Berber 01/25/2018, 5:00 PM

## 2018-01-25 NOTE — Progress Notes (Signed)
Pt having multiple episodes of emesis, non responsive to IV Zofran. Cardiology paged, orders received for IV compazine. Will continue to closely monitor pt.  Sherlie Ban, RN

## 2018-01-25 NOTE — Progress Notes (Addendum)
Electrophysiology Rounding Note  Patient Name: Daryl Rodriguez Date of Encounter: 01/25/2018  Primary Cardiologist: Claiborne Billings Electrophysiologist: Lovena Le   Subjective   The patient feels better this morning.  Required 2 shocks last night for recurrent VT.   Inpatient Medications    Scheduled Meds: . aspirin EC  81 mg Oral Daily  . atorvastatin  80 mg Oral q1800  . docusate sodium  100 mg Oral Daily  . insulin aspart  2-6 Units Subcutaneous Q4H  . isosorbide mononitrate  30 mg Oral Daily  . mouth rinse  15 mL Mouth Rinse BID  . metoprolol tartrate  5 mg Intravenous Q6H  . senna-docusate  2 tablet Oral BID  . sodium chloride flush  3 mL Intravenous Q12H  . sodium chloride flush  3 mL Intravenous Q12H  . ticagrelor  90 mg Oral BID   Continuous Infusions: . sodium chloride Stopped (01/23/18 1400)  . sodium chloride    . sodium chloride 50 mL/hr at 01/25/18 0700  . amiodarone 30 mg/hr (01/25/18 0700)  . heparin Stopped (01/25/18 0715)  . lidocaine 2 mg/min (01/25/18 0700)   PRN Meds: sodium chloride, sodium chloride, acetaminophen, alum & mag hydroxide-simeth, loperamide, nitroGLYCERIN, ondansetron (ZOFRAN) IV, sodium chloride flush, sodium chloride flush, zolpidem   Vital Signs    Vitals:   01/25/18 0615 01/25/18 0630 01/25/18 0645 01/25/18 0700  BP: (!) 141/98 (!) 158/80 (!) 143/83 (!) 157/77  Pulse: (!) 103 99 91 (!) 101  Resp: 19 16 17 20   Temp:      TempSrc:      SpO2: 95% 97% 99% 98%  Weight:      Height:        Intake/Output Summary (Last 24 hours) at 01/25/2018 0730 Last data filed at 01/25/2018 0700 Gross per 24 hour  Intake 2575.01 ml  Output 1895 ml  Net 680.01 ml   Filed Weights   01/22/18 1830 01/22/18 1845 01/25/18 0500  Weight: 180 lb (81.6 kg) 226 lb 3.1 oz (102.6 kg) 209 lb 14.1 oz (95.2 kg)    Physical Exam    GEN- The patient is elderly appearing, alert and oriented x 3 today.   Head- normocephalic, atraumatic Eyes-  Sclera clear,  conjunctiva pink Ears- hearing intact Oropharynx- clear Neck- supple Lungs- Clear to ausculation bilaterally, normal work of breathing Heart- Irregular rate and rhythm  GI- soft, NT, ND, + BS Extremities- no clubbing, cyanosis, or edema Skin- no rash or lesion Psych- euthymic mood, full affect Neuro- strength and sensation are intact  Labs    CBC Recent Labs    01/24/18 0004 01/25/18 0233  WBC 10.9* 5.5  HGB 10.5* 15.4  HCT 32.2* 45.8  MCV 96.1 92.7  PLT 272 409*   Basic Metabolic Panel Recent Labs    01/24/18 1202 01/24/18 1927 01/25/18 0233  NA  --  137 137  K  --  3.8 5.3*  CL  --  99* 101  CO2  --  23 23  GLUCOSE  --  244* 237*  BUN  --  52* 60*  CREATININE  --  1.78* 1.89*  CALCIUM  --  9.1 9.0  MG 2.1 2.0  --    Liver Function Tests Recent Labs    01/22/18 1632 01/23/18 0258  AST 22 35  ALT 24 30  ALKPHOS 65 57  BILITOT 1.2 1.3*  PROT 6.9 5.9*  ALBUMIN 3.0* 2.6*   No results for input(s): LIPASE, AMYLASE in the last 72 hours. Cardiac Enzymes  Recent Labs    01/22/18 1632 01/22/18 1934 01/24/18 1927  TROPONINI 1.80* 2.75* 3.19*   BNP Invalid input(s): POCBNP D-Dimer No results for input(s): DDIMER in the last 72 hours. Hemoglobin A1C Recent Labs    01/22/18 1632  HGBA1C 6.6*   Fasting Lipid Panel Recent Labs    01/22/18 1632  CHOL 126  HDL 43  LDLCALC 68  TRIG 75  CHOLHDL 2.9   Thyroid Function Tests No results for input(s): TSH, T4TOTAL, T3FREE, THYROIDAB in the last 72 hours.  Invalid input(s): FREET3  Telemetry    AF, PVC's, sustained VT requiring cardioversion overnight (personally reviewed)  Radiology    Dg Chest Port 1 View  Result Date: 01/24/2018 CLINICAL DATA:  Acute respiratory failure with hypoxia and dyspnea EXAM: PORTABLE CHEST 1 VIEW COMPARISON:  01/22/2018 chest radiograph. FINDINGS: Stable cardiomediastinal silhouette with mild cardiomegaly. No pneumothorax. No right pleural effusion. Small left pleural  effusion appears increased. Mild hazy and linear parahilar lung opacities, improved in the interval. Patchy left lung base opacity with decreased lung volumes. IMPRESSION: 1. Mild congestive heart failure, improved. 2. Small left pleural effusion, increased. 3. Decreased lung volumes with patchy left lung base opacity, favor atelectasis. Electronically Signed   By: Ilona Sorrel M.D.   On: 01/24/2018 10:10   Dg Abd Portable 1v  Result Date: 01/24/2018 CLINICAL DATA:  Check nasogastric catheter placement EXAM: PORTABLE ABDOMEN - 1 VIEW COMPARISON:  01/24/2018 FINDINGS: Nasogastric catheter is now noted coiled within the stomach. The stomach is decompressed when compared with the previous exam. Postsurgical changes are again noted. IMPRESSION: Nasogastric catheter within the stomach. Electronically Signed   By: Inez Catalina M.D.   On: 01/24/2018 11:52   Dg Abd Portable 1v  Result Date: 01/24/2018 CLINICAL DATA:  Abdominal pain and distention EXAM: PORTABLE ABDOMEN - 1 VIEW COMPARISON:  04/19/2011 CT abdomen/pelvis FINDINGS: Bilateral lumbar spinal fusion hardware overlies the lower lumbar spine. There is moderate gaseous distention of the stomach. No dilated small bowel loops. Mild stool and gas in the large bowel. No evidence of pneumatosis or pneumoperitoneum. Mild hazy left lung base opacity. No radiopaque nephrolithiasis. IMPRESSION: Nonspecific moderate gaseous distention of the stomach. No evidence of small or large bowel obstruction. Mild hazy left lung base opacity, please see the separate concurrent chest radiograph report for details. Electronically Signed   By: Ilona Sorrel M.D.   On: 01/24/2018 10:08    Assessment & Plan    1.  Ventricular tachycardia Calmed down in the last few hours with amiodarone and lidocaine Required cardioversion twice overnight Continue amiodarone and lidocaine today Will try to wean lidocaine tomorrow Keep K>3.9, Mg >1.8 Relook cath this morning No driving x6  months  2.  New onset atrial fibrillation CHADS2VASC is 5 In the setting of STEMI Will need long term anticoagulation  3.  CAD/Inferior STEMI S/p DES to RCA Relook cath this morning   Signed, Chanetta Marshall, NP  01/25/2018, 7:30 AM   EP attending  Patient seen and examined.  He remains critically ill.  He required cardioversion for unstable ventricular tachycardia.  The patient will undergo left heart catheterization today.  He will continue intravenous amiodarone and lidocaine.  We will have to watch for evidence of lidocaine toxicity.  Long-term treatment will depend on how well he recovers.  His prognosis is guarded.  Continue guideline directed medical therapy for now and supportive care.  Hopefully his atrial fibrillation will convert as he is on intravenous amiodarone.  Cristopher Peru, MD    Crissie Sickles, MD

## 2018-01-25 NOTE — Interval H&P Note (Signed)
History and Physical Interval Note:  01/25/2018 7:44 AM  Daryl Rodriguez  has presented today for surgery, with the diagnosis of Relook  The various methods of treatment have been discussed with the patient and family. After consideration of risks, benefits and other options for treatment, the patient has consented to  Procedure(s): LEFT HEART CATH AND CORONARY ANGIOGRAPHY - RELOOK (N/A) as a surgical intervention .  The patient's history has been reviewed, patient examined, no change in status, stable for surgery.  I have reviewed the patient's chart and labs.  Questions were answered to the patient's satisfaction.     [name]

## 2018-01-25 NOTE — Progress Notes (Signed)
Progress Note  Patient Name: Daryl Rodriguez Date of Encounter: 01/25/2018  Primary Cardiologist: Shelva Majestic, MD   Subjective   Postop day 3 inferior STEMI.  The patient remains on 3L of oxygen but is much less short of breath..  He denies chest pain.  He has remained hemodynamically stable but did go into A. fib with RVR 2 days ago but has since converted since he was in the Cath Lab this morning.  Because of recurrent hemodynamically stable ventricular tachycardia yesterday last night requiring cardioversion x2 he was brought to the Cath Lab this morning to rule out an ischemic substrate.  This revealed unchanged anatomy although his LVEDP was elevated.  He said no recurrent arrhythmias on amiodarone and lidocaine.  Inpatient Medications    Scheduled Meds: . aspirin EC  81 mg Oral Daily  . atorvastatin  80 mg Oral q1800  . docusate sodium  100 mg Oral Daily  . insulin aspart  2-6 Units Subcutaneous Q4H  . isosorbide mononitrate  30 mg Oral Daily  . mouth rinse  15 mL Mouth Rinse BID  . metoprolol tartrate  5 mg Intravenous Q6H  . pantoprazole  40 mg Oral BID  . senna-docusate  2 tablet Oral BID  . sodium chloride flush  3 mL Intravenous Q12H  . sodium chloride flush  3 mL Intravenous Q12H  . ticagrelor  90 mg Oral BID   Continuous Infusions: . sodium chloride Stopped (01/23/18 1400)  . sodium chloride    . amiodarone 30 mg/hr (01/25/18 0700)  . heparin    . lidocaine 2 mg/min (01/25/18 0700)   PRN Meds: sodium chloride, sodium chloride, acetaminophen, alum & mag hydroxide-simeth, loperamide, nitroGLYCERIN, ondansetron (ZOFRAN) IV, sodium chloride flush, sodium chloride flush, zolpidem   Vital Signs    Vitals:   01/25/18 0811 01/25/18 0816 01/25/18 0821 01/25/18 0826  BP: (!) 177/72 (!) 173/70 (!) 199/71 (!) 178/75  Pulse: 78 77 80 80  Resp: 14 17 (!) 8 (!) 21  Temp:      TempSrc:      SpO2: 92% 92% (!) 88% (!) 0%  Weight:      Height:        Intake/Output  Summary (Last 24 hours) at 01/25/2018 0907 Last data filed at 01/25/2018 0700 Gross per 24 hour  Intake 2507.01 ml  Output 1895 ml  Net 612.01 ml   Filed Weights   01/22/18 1830 01/22/18 1845 01/25/18 0500  Weight: 180 lb (81.6 kg) 226 lb 3.1 oz (102.6 kg) 209 lb 14.1 oz (95.2 kg)    Telemetry    Normal sinus rhythm-  personally Reviewed  ECG    Not performed today -personally Reviewed  Physical Exam   GEN: No acute distress.   Neck: No JVD Cardiac:  Irregularly irregular, no murmurs, rubs, or gallops.  Respiratory: Clear to auscultation bilaterally. GI: Soft, nontender, moderately distended MS: No edema; No deformity. Neuro:  Nonfocal  Psych: Normal affect  Extremities: Mild bruising right radial puncture site.  Mild pedal edema   Labs    Chemistry Recent Labs  Lab 01/22/18 1632 01/23/18 0258 01/24/18 0004 01/24/18 1927 01/25/18 0233  NA 135  135 137 137 137 137  K 4.4  4.3 4.1 4.5 3.8 5.3*  CL 101  101 102 103 99* 101  CO2 21* 25 22 23 23   GLUCOSE 163*  172* 148* 182* 244* 237*  BUN 28*  29* 30* 37* 52* 60*  CREATININE 1.39*  1.20 1.43* 1.64*  1.78* 1.89*  CALCIUM 8.9 8.6* 8.9 9.1 9.0  PROT 6.9 5.9*  --   --   --   ALBUMIN 3.0* 2.6*  --   --   --   AST 22 35  --   --   --   ALT 24 30  --   --   --   ALKPHOS 65 57  --   --   --   BILITOT 1.2 1.3*  --   --   --   GFRNONAA 46* 44* 38* 34* 32*  GFRAA 53* 51* 44* 39* 37*  ANIONGAP 13 10 12 15 13      Hematology Recent Labs  Lab 01/23/18 0258 01/24/18 0004 01/25/18 0233  WBC 9.6 10.9* 5.5  RBC 3.21* 3.35* 4.94  HGB 9.9* 10.5* 15.4  HCT 30.3* 32.2* 45.8  MCV 94.4 96.1 92.7  MCH 30.8 31.3 31.2  MCHC 32.7 32.6 33.6  RDW 13.8 14.0 13.8  PLT 207 272 146*    Cardiac Enzymes Recent Labs  Lab 01/22/18 1632 01/22/18 1934 01/24/18 1927  TROPONINI 1.80* 2.75* 3.19*   No results for input(s): TROPIPOC in the last 168 hours.   BNP Recent Labs  Lab 01/22/18 1934  BNP 695.6*     DDimer No  results for input(s): DDIMER in the last 168 hours.   Radiology    Dg Chest Port 1 View  Result Date: 01/24/2018 CLINICAL DATA:  Acute respiratory failure with hypoxia and dyspnea EXAM: PORTABLE CHEST 1 VIEW COMPARISON:  01/22/2018 chest radiograph. FINDINGS: Stable cardiomediastinal silhouette with mild cardiomegaly. No pneumothorax. No right pleural effusion. Small left pleural effusion appears increased. Mild hazy and linear parahilar lung opacities, improved in the interval. Patchy left lung base opacity with decreased lung volumes. IMPRESSION: 1. Mild congestive heart failure, improved. 2. Small left pleural effusion, increased. 3. Decreased lung volumes with patchy left lung base opacity, favor atelectasis. Electronically Signed   By: Ilona Sorrel M.D.   On: 01/24/2018 10:10   Dg Abd Portable 1v  Result Date: 01/24/2018 CLINICAL DATA:  Check nasogastric catheter placement EXAM: PORTABLE ABDOMEN - 1 VIEW COMPARISON:  01/24/2018 FINDINGS: Nasogastric catheter is now noted coiled within the stomach. The stomach is decompressed when compared with the previous exam. Postsurgical changes are again noted. IMPRESSION: Nasogastric catheter within the stomach. Electronically Signed   By: Inez Catalina M.D.   On: 01/24/2018 11:52   Dg Abd Portable 1v  Result Date: 01/24/2018 CLINICAL DATA:  Abdominal pain and distention EXAM: PORTABLE ABDOMEN - 1 VIEW COMPARISON:  04/19/2011 CT abdomen/pelvis FINDINGS: Bilateral lumbar spinal fusion hardware overlies the lower lumbar spine. There is moderate gaseous distention of the stomach. No dilated small bowel loops. Mild stool and gas in the large bowel. No evidence of pneumatosis or pneumoperitoneum. Mild hazy left lung base opacity. No radiopaque nephrolithiasis. IMPRESSION: Nonspecific moderate gaseous distention of the stomach. No evidence of small or large bowel obstruction. Mild hazy left lung base opacity, please see the separate concurrent chest radiograph  report for details. Electronically Signed   By: Ilona Sorrel M.D.   On: 01/24/2018 10:08    Cardiac Studies   Cardiac catheterization (01/22/2018)  Conclusion     Mid Cx lesion is 75% stenosed.  Prox LAD to Mid LAD lesion is 65% stenosed.  Dist RCA lesion is 20% stenosed.  Mid RCA lesion is 30% stenosed.  Prox RCA to Mid RCA lesion is 100% stenosed.  Post intervention, there is a 0% residual stenosis.  A stent was successfully placed.   Acute ST segment elevation myocardial infarction secondary to total occlusion of a calcified mid right coronary artery with thrombus formation with initial TIMI 0 flow.  Coronary calcification with multivessel CAD and additional 60-70% calcified stenosis of the mid LAD and 75% stenoses of the left circumflex vessel.  Difficult but successful percutaneous coronary intervention to the totally occluded calcified mid RCA with PTCA, and ultimate insertion of a 3.024 mm Synergy DES stent postdilated to 3.25 mm with the 100% occlusion being reduced to 0% and restoration of brisk TIMI-3 flow.  RECOMMENDATION: Dual antiplatelet therapy for minimum of 1 year and probably indefinitely.  Continue Aggrastat for 18 hours post procedure.  Initial medical therapy for concomitant CAD.  Due to continued hypoxemia, the patient may benefit from BiPAP therapy in the CCU with continued IV diuresis.  A 2-D echo Doppler study will be scheduled to assess LV function and valvular architecture.  High potency statin therapy, and guideline directed optimal post MI treatment.   2D echocardiogram (01/23/2018)  Study Conclusions  - Left ventricle: The cavity size was mildly dilated. Systolic   function was mildly to moderately reduced. The estimated ejection   fraction was in the range of 40% to 45%. Diffuse hypokinesis.   Doppler parameters are consistent with abnormal left ventricular   relaxation (grade 1 diastolic dysfunction). - Mitral valve: There was mild  regurgitation. - Left atrium: The atrium was mildly dilated. - Right ventricle: The cavity size was moderately dilated. Wall   thickness was normal. - Tricuspid valve: There was mild regurgitation. - Pulmonary arteries: Systolic pressure was mildly increased. PA   peak pressure: 35 mm Hg (S  Patient Profile     84 y.o. male with a history of hypertension, diabetes and hyperlipidemia but no prior cardiac history who had inferior STEMI 2 days ago treated with PCI and drug-eluting stenting via the right radial approach by Dr. Claiborne Billings with an excellent result.  The lesion was thrombotic.  He had moderate disease in his LAD and circumflex.  His troponin only increased to 2.75.  His EKG shows residual slight ST segment elevation in inferior leads.  He did go into A. fib with RVR which has since converted back to sinus rhythm this morning.  He has been placed on IV heparin although was having significant HEENT from his NG tube which has since been discontinued.Marland Kitchen  He still requiring 3 L of oxygen with sats in the low 90 range although he is much more comfortable and breathing better.Marland Kitchen  He is also had some abdominal distention as well with a large gastric air bubble on abdominal flatplate.  NG tube was placed which successfully decompressed him.  His serum creatinine has increased from 1.2 up to 1.6---> 1.9  with IV diuresis.  Because of the ventricular tachycardia which is fairly incessant yesterday he underwent cardiac catheterization this morning revealing unchanged anatomy.  He did convert to sinus rhythm.  He had no further arrhythmias since been put being placed on amiodarone and IV lidocaine.  Assessment & Plan    1: Inferior STEMI- postop day 2 inferior STEMI treated with PCI and drug-eluting stenting of the mid RCA with a synergy 3 mm x 24 mm long drug-eluting stent.  His troponin only increased to 2.75.  He is on dual antiplatelet therapy.  A 2D echo revealed an ejection fraction of 40% with grade 1  diastolic dysfunction and a PA pressure 35 mmHg.  Marland Kitchen  Plan for  medical treatment of his residual CAD.  He underwent relook cardiac catheterization this morning because of his ventricular tachycardia to rule out an ischemic substrate with unchanged anatomy.  2: Essential hypertension- blood pressure controlled on metoprolol  3: Hyperlipidemia- on high-dose statin therapy  4: Hypoxemia- patient was relatively hypoxemic on 7 L of oxygen with a sat of 94%.  This has since been weaned down to 3 L.  He did diurese half a liter on IV Lasix although his serum creatinine increased from 1.2->1.4-> 1.6--> 1.9   .  We will discontinue his diuretics.  Dr. Lake Bells is aware and following.  His chest x-ray showed only mild CHF which was improving.  Abdominal flatplate did show a large gastric air bubble.  An NG tube was placed and this was successfully decompressed.  He did have a significant amount of heme from his NG tube.  5.  Atrial fibrillation- patient was in A. fib on IV Cardizem.  Cardizem was discontinued.  His A. fib converted this morning in the Cath Lab to sinus rhythm.  Because of his heme positive NG aspirate we will discontinue the IV heparin and place him on Protonix.   For questions or updates, please contact [place] Please consult [email or web address] for contact info under Cardiology/STEMI.      Signed, Quay Burow, MD  01/25/2018, 9:07 AM

## 2018-01-26 ENCOUNTER — Inpatient Hospital Stay (HOSPITAL_COMMUNITY): Payer: Medicare Other

## 2018-01-26 DIAGNOSIS — I480 Paroxysmal atrial fibrillation: Secondary | ICD-10-CM

## 2018-01-26 DIAGNOSIS — J9600 Acute respiratory failure, unspecified whether with hypoxia or hypercapnia: Secondary | ICD-10-CM

## 2018-01-26 LAB — RENAL FUNCTION PANEL
Albumin: 2.6 g/dL — ABNORMAL LOW (ref 3.5–5.0)
Anion gap: 10 (ref 5–15)
BUN: 50 mg/dL — ABNORMAL HIGH (ref 6–20)
CO2: 26 mmol/L (ref 22–32)
CREATININE: 1.48 mg/dL — AB (ref 0.61–1.24)
Calcium: 8.6 mg/dL — ABNORMAL LOW (ref 8.9–10.3)
Chloride: 99 mmol/L — ABNORMAL LOW (ref 101–111)
GFR, EST AFRICAN AMERICAN: 49 mL/min — AB (ref >60)
GFR, EST NON AFRICAN AMERICAN: 43 mL/min — AB (ref >60)
Glucose, Bld: 231 mg/dL — ABNORMAL HIGH (ref 65–99)
POTASSIUM: 3.8 mmol/L (ref 3.5–5.1)
Phosphorus: 3.4 mg/dL (ref 2.5–4.6)
Sodium: 135 mmol/L (ref 135–145)

## 2018-01-26 LAB — CBC
HEMATOCRIT: 24.7 % — AB (ref 39.0–52.0)
Hemoglobin: 8.1 g/dL — ABNORMAL LOW (ref 13.0–17.0)
MCH: 31.2 pg (ref 26.0–34.0)
MCHC: 32.8 g/dL (ref 30.0–36.0)
MCV: 95 fL (ref 78.0–100.0)
Platelets: 253 10*3/uL (ref 150–400)
RBC: 2.6 MIL/uL — AB (ref 4.22–5.81)
RDW: 13.9 % (ref 11.5–15.5)
WBC: 10.7 10*3/uL — AB (ref 4.0–10.5)

## 2018-01-26 LAB — MAGNESIUM: MAGNESIUM: 2 mg/dL (ref 1.7–2.4)

## 2018-01-26 LAB — UREA NITROGEN, URINE: Urea Nitrogen, Ur: 1031 mg/dL

## 2018-01-26 MED ORDER — GLIMEPIRIDE 4 MG PO TABS
4.0000 mg | ORAL_TABLET | Freq: Every day | ORAL | Status: DC
  Administered 2018-01-26 – 2018-02-01 (×5): 4 mg via ORAL
  Filled 2018-01-26 (×6): qty 1

## 2018-01-26 MED ORDER — LATANOPROST 0.005 % OP SOLN
1.0000 [drp] | Freq: Every day | OPHTHALMIC | Status: DC
  Administered 2018-01-26 – 2018-01-31 (×6): 1 [drp] via OPHTHALMIC
  Filled 2018-01-26 (×2): qty 2.5

## 2018-01-26 MED ORDER — POTASSIUM CHLORIDE CRYS ER 20 MEQ PO TBCR
20.0000 meq | EXTENDED_RELEASE_TABLET | Freq: Once | ORAL | Status: AC
  Administered 2018-01-26: 20 meq via ORAL
  Filled 2018-01-26: qty 1

## 2018-01-26 MED ORDER — METOPROLOL TARTRATE 50 MG PO TABS
50.0000 mg | ORAL_TABLET | Freq: Two times a day (BID) | ORAL | Status: DC
  Administered 2018-01-26 – 2018-02-01 (×13): 50 mg via ORAL
  Filled 2018-01-26 (×13): qty 1

## 2018-01-26 MED FILL — Heparin Sod (Porcine)-NaCl IV Soln 1000 Unit/500ML-0.9%: INTRAVENOUS | Qty: 1000 | Status: AC

## 2018-01-26 NOTE — Progress Notes (Signed)
Patient not placed on CPAP.  Patient felt sick earlier in the night.  Patient satting 94% on 4L resting well.  RT will continue to monitor.

## 2018-01-26 NOTE — Progress Notes (Signed)
[facility] Rounding Note  Subjective/Interval History:  No new events "Just looking for some good new"  Objective Vital signs in last 24 hours: Vitals:   01/26/18 0833 01/26/18 0840 01/26/18 0900 01/26/18 1000  BP:   (!) 162/71 (!) 160/64  Pulse:   78 77  Resp:   15 12  Temp: 98.4 F (36.9 C) (!) 97.4 F (36.3 C)    TempSrc: Oral Axillary    SpO2:   96% 95%  Weight:      Height:       Weight change:   Intake/Output Summary (Last 24 hours) at 01/26/2018 1026 Last data filed at 01/26/2018 0745 Gross per 24 hour  Intake 1619.7 ml  Output 1110 ml  Net 509.7 ml   Physical Exam:  Blood pressure (!) 160/64, pulse 77, temperature (!) 97.4 F (36.3 C), temperature source Axillary, resp. rate 12, height 5\' 9"  (1.753 m), weight 95.2 kg (209 lb 14.1 oz), SpO2 95 %.   Older large framed WM Sitting in the chair Depressed VS as noted 5-6 cm JVP Few R base crackles Regular rhythm S1S2 NO S3 No audible murmur  No discernible LE edema Foley draining clear yellow urine   Recent Labs  Lab 01/22/18 1632 01/23/18 0258 01/24/18 0004 01/24/18 1927 01/25/18 0233 01/26/18 0244  NA 135  135 137 137 137 137 135  K 4.4  4.3 4.1 4.5 3.8 5.3* 3.8  CL 101  101 102 103 99* 101 99*  CO2 21* 25 22 23 23 26   GLUCOSE 163*  172* 148* 182* 244* 237* 231*  BUN 28*  29* 30* 37* 52* 60* 50*  CREATININE 1.39*  1.20 1.43* 1.64* 1.78* 1.89* 1.48*  CALCIUM 8.9 8.6* 8.9 9.1 9.0 8.6*  PHOS  --   --   --   --   --  3.4   Recent Labs  Lab 01/22/18 1632 01/23/18 0258 01/26/18 0244  AST 22 35  --   ALT 24 30  --   ALKPHOS 65 57  --   BILITOT 1.2 1.3*  --   PROT 6.9 5.9*  --   ALBUMIN 3.0* 2.6* 2.6*    Recent Labs  Lab 01/23/18 0258 01/24/18 0004 01/25/18 0233 01/26/18 0244  WBC 9.6 10.9* 5.5 10.7*  HGB 9.9* 10.5* 15.4 8.1*  HCT 30.3* 32.2* 45.8 24.7*  MCV 94.4 96.1 92.7 95.0  PLT 207 272 146* 253    Recent Labs  Lab 01/22/18 1632 01/22/18 1934 01/24/18 1927  TROPONINI 1.80*  2.75* 3.19*    Recent Labs  Lab 01/24/18 2336 01/25/18 0311 01/25/18 0849 01/25/18 1211 01/25/18 1612  GLUCAP 268* 208* 152* 147* 159*   Studies/Results: US Renal  Result Date: 01/25/2018 CLINICAL DATA:  Acute on chronic renal failure EXAM: RENAL / URINARY TRACT ULTRASOUND COMPLETE COMPARISON:  CT 04/19/2011 FINDINGS: Right Kidney: Length: 9.9 cm. Echogenicity within normal limits. No mass or hydronephrosis visualized. Left Kidney: Length: 10.4 cm. Echogenicity within normal limits. No mass or hydronephrosis visualized. Bladder: Not well seen, probably empty. IMPRESSION: Ultrasound appearance of the kidneys is within normal limits Electronically Signed   By: Donavan Foil M.D.   On: 01/25/2018 23:22   Dg Chest Port 1 View  Result Date: 01/26/2018 CLINICAL DATA:  Shortness of breath EXAM: PORTABLE CHEST 1 VIEW COMPARISON:  01/24/2018 FINDINGS: Cardiac shadow is stable. Aortic calcifications are again seen. Increasing vascular congestion and interstitial edema is noted consistent with worsening CHF. No focal confluent infiltrate is seen. No bony abnormality  is noted. IMPRESSION: Worsening CHF. Electronically Signed   By: Inez Catalina M.D.   On: 01/26/2018 07:39   Dg Abd Portable 1v  Result Date: 01/24/2018 CLINICAL DATA:  Check nasogastric catheter placement EXAM: PORTABLE ABDOMEN - 1 VIEW COMPARISON:  01/24/2018 FINDINGS: Nasogastric catheter is now noted coiled within the stomach. The stomach is decompressed when compared with the previous exam. Postsurgical changes are again noted. IMPRESSION: Nasogastric catheter within the stomach. Electronically Signed   By: Inez Catalina M.D.   On: 01/24/2018 11:52   Medications: . sodium chloride Stopped (01/23/18 1400)  . amiodarone 30 mg/hr (01/26/18 0900)   . aspirin EC  81 mg Oral Daily  . atorvastatin  80 mg Oral q1800  . docusate sodium  100 mg Oral Daily  . glimepiride  4 mg Oral Q breakfast  . isosorbide mononitrate  30 mg Oral Daily  .  mouth rinse  15 mL Mouth Rinse BID  . metoprolol tartrate  50 mg Oral BID  . pantoprazole  40 mg Oral BID  . senna-docusate  2 tablet Oral BID  . ticagrelor  90 mg Oral BID   Assessment/Plan:  1. AKI on CKD stage III:  Baseline creatinine 1.2-1.3. Peaked at 1.89 and coming down. STEMI/renal hypoperfusion/IV contrast/pre admission lisinopril AND meloxican - so hemodynamically driven + contrast. Possible vol depletion in the mix. Renal US 9.9, 10.4 not echodense, UA benign. Diuretics on hold at the present time. Renal function appears to have turned the corner. Avoid further NSAID use. Do not resume lisinopril until fully recovered from AKI episode. OK with me for cardiology to restart diuretics at their discretion. Renal will sign off at this time. Call me with questions. Family aware to give no more meloxicam (or other NSAID) at home.   2. Hyperkalemia - mild and transient. Veltassa ordered, never given.  3. Inferior ST elevation MI: Status post PCI with drug-eluting stent to RCA,  postprocedure phase complicated by sustained VT treated with cardioversion and repeat angio 5/23.  Continue medical management per cardiology-avoid resumption of ACE inhibitor at this time. 4. Hypertension: Blood pressures initially in flux, improved with diuresis and metoprolol. 5. Atrial fibrillation: Currently back in sinus rhythm, previously with A. fib with RVR. 6. Hypoxemia: Prior history of obstructive sleep apnea on CPAP-status post aggressive diuretic therapy/volume unloading but remains on supplemental oxygen via nasal cannula. 7. Anemia - large drop Hb. Will need careful f/u given 2 interventional procedures in past 72 hours  Jamal Maes, MD [facility] [phone] pager 01/26/2018, 10:26 AM

## 2018-01-26 NOTE — Progress Notes (Addendum)
Progress Note  Patient Name: Daryl Rodriguez Date of Encounter: 01/26/2018  Primary Cardiologist: Shelva Majestic, MD   Subjective   Postop day 4 inferior STEMI.  The patient remains on 4L of oxygen but is much less short of breath..  He denies chest pain.  He has remained hemodynamically stable but did go into A. fib with RVR 3 days ago but has since converted since he was in the Cath Lab yesterday morning.  Because of recurrent hemodynamically stable ventricular tachycardia 2 days ago requiring cardioversion x2 he was brought to the Cath Lab yesterday morning to rule out an ischemic substrate.  This revealed unchanged anatomy although his LVEDP was elevated.  He said no recurrent arrhythmias on amiodarone and lidocaine.  He denies chest pain or shortness of breath.  He is sitting up in a chair today with complaints of shivering.  Inpatient Medications    Scheduled Meds: . aspirin EC  81 mg Oral Daily  . atorvastatin  80 mg Oral q1800  . docusate sodium  100 mg Oral Daily  . isosorbide mononitrate  30 mg Oral Daily  . mouth rinse  15 mL Mouth Rinse BID  . metoprolol tartrate  5 mg Intravenous Q6H  . pantoprazole  40 mg Oral BID  . potassium chloride  20 mEq Oral Once  . senna-docusate  2 tablet Oral BID  . ticagrelor  90 mg Oral BID   Continuous Infusions: . sodium chloride Stopped (01/23/18 1400)  . amiodarone 30 mg/hr (01/26/18 0700)   PRN Meds: sodium chloride, acetaminophen, alum & mag hydroxide-simeth, loperamide, nitroGLYCERIN, ondansetron (ZOFRAN) IV, prochlorperazine, sodium chloride flush, zolpidem   Vital Signs    Vitals:   01/26/18 0500 01/26/18 0600 01/26/18 0700 01/26/18 0833  BP: 132/65 139/73 (!) 158/67   Pulse: 79 73 80   Resp: 15 15 16    Temp:    98.4 F (36.9 C)  TempSrc:    Oral  SpO2: 94% 95% 95%   Weight:      Height:        Intake/Output Summary (Last 24 hours) at 01/26/2018 0848 Last data filed at 01/26/2018 0700 Gross per 24 hour  Intake 1690.5  ml  Output 1150 ml  Net 540.5 ml   Filed Weights   01/22/18 1830 01/22/18 1845 01/25/18 0500  Weight: 180 lb (81.6 kg) 226 lb 3.1 oz (102.6 kg) 209 lb 14.1 oz (95.2 kg)    Telemetry    Normal sinus rhythm-  personally Reviewed  ECG    Not performed today -personally Reviewed  Physical Exam   GEN: No acute distress.   Neck: No JVD Cardiac:  Irregularly irregular, no murmurs, rubs, or gallops.  Respiratory: Clear to auscultation bilaterally. GI: Soft, nontender, moderately distended MS: No edema; No deformity. Neuro:  Nonfocal  Psych: Normal affect  Extremities: Mild bruising right radial puncture site.  Mild pedal edema Examination revealed no changes since yesterday.  Labs    Chemistry Recent Labs  Lab 01/22/18 1632 01/23/18 0258  01/24/18 1927 01/25/18 0233 01/26/18 0244  NA 135  135 137   < > 137 137 135  K 4.4  4.3 4.1   < > 3.8 5.3* 3.8  CL 101  101 102   < > 99* 101 99*  CO2 21* 25   < > 23 23 26   GLUCOSE 163*  172* 148*   < > 244* 237* 231*  BUN 28*  29* 30*   < > 52* 60* 50*  CREATININE 1.39*  1.20 1.43*   < > 1.78* 1.89* 1.48*  CALCIUM 8.9 8.6*   < > 9.1 9.0 8.6*  PROT 6.9 5.9*  --   --   --   --   ALBUMIN 3.0* 2.6*  --   --   --  2.6*  AST 22 35  --   --   --   --   ALT 24 30  --   --   --   --   ALKPHOS 65 57  --   --   --   --   BILITOT 1.2 1.3*  --   --   --   --   GFRNONAA 46* 44*   < > 34* 32* 43*  GFRAA 53* 51*   < > 39* 37* 49*  ANIONGAP 13 10   < > 15 13 10    < > = values in this interval not displayed.     Hematology Recent Labs  Lab 01/24/18 0004 01/25/18 0233 01/26/18 0244  WBC 10.9* 5.5 10.7*  RBC 3.35* 4.94 2.60*  HGB 10.5* 15.4 8.1*  HCT 32.2* 45.8 24.7*  MCV 96.1 92.7 95.0  MCH 31.3 31.2 31.2  MCHC 32.6 33.6 32.8  RDW 14.0 13.8 13.9  PLT 272 146* 253    Cardiac Enzymes Recent Labs  Lab 01/22/18 1632 01/22/18 1934 01/24/18 1927  TROPONINI 1.80* 2.75* 3.19*   No results for input(s): TROPIPOC in the last  168 hours.   BNP Recent Labs  Lab 01/22/18 1934  BNP 695.6*     DDimer No results for input(s): DDIMER in the last 168 hours.   Radiology    US Renal  Result Date: 01/25/2018 CLINICAL DATA:  Acute on chronic renal failure EXAM: RENAL / URINARY TRACT ULTRASOUND COMPLETE COMPARISON:  CT 04/19/2011 FINDINGS: Right Kidney: Length: 9.9 cm. Echogenicity within normal limits. No mass or hydronephrosis visualized. Left Kidney: Length: 10.4 cm. Echogenicity within normal limits. No mass or hydronephrosis visualized. Bladder: Not well seen, probably empty. IMPRESSION: Ultrasound appearance of the kidneys is within normal limits Electronically Signed   By: Donavan Foil M.D.   On: 01/25/2018 23:22   Dg Chest Port 1 View  Result Date: 01/26/2018 CLINICAL DATA:  Shortness of breath EXAM: PORTABLE CHEST 1 VIEW COMPARISON:  01/24/2018 FINDINGS: Cardiac shadow is stable. Aortic calcifications are again seen. Increasing vascular congestion and interstitial edema is noted consistent with worsening CHF. No focal confluent infiltrate is seen. No bony abnormality is noted. IMPRESSION: Worsening CHF. Electronically Signed   By: Inez Catalina M.D.   On: 01/26/2018 07:39   Dg Chest Port 1 View  Result Date: 01/24/2018 CLINICAL DATA:  Acute respiratory failure with hypoxia and dyspnea EXAM: PORTABLE CHEST 1 VIEW COMPARISON:  01/22/2018 chest radiograph. FINDINGS: Stable cardiomediastinal silhouette with mild cardiomegaly. No pneumothorax. No right pleural effusion. Small left pleural effusion appears increased. Mild hazy and linear parahilar lung opacities, improved in the interval. Patchy left lung base opacity with decreased lung volumes. IMPRESSION: 1. Mild congestive heart failure, improved. 2. Small left pleural effusion, increased. 3. Decreased lung volumes with patchy left lung base opacity, favor atelectasis. Electronically Signed   By: Ilona Sorrel M.D.   On: 01/24/2018 10:10   Dg Abd Portable 1v  Result  Date: 01/24/2018 CLINICAL DATA:  Check nasogastric catheter placement EXAM: PORTABLE ABDOMEN - 1 VIEW COMPARISON:  01/24/2018 FINDINGS: Nasogastric catheter is now noted coiled within the stomach. The stomach is decompressed when compared with the  previous exam. Postsurgical changes are again noted. IMPRESSION: Nasogastric catheter within the stomach. Electronically Signed   By: Inez Catalina M.D.   On: 01/24/2018 11:52   Dg Abd Portable 1v  Result Date: 01/24/2018 CLINICAL DATA:  Abdominal pain and distention EXAM: PORTABLE ABDOMEN - 1 VIEW COMPARISON:  04/19/2011 CT abdomen/pelvis FINDINGS: Bilateral lumbar spinal fusion hardware overlies the lower lumbar spine. There is moderate gaseous distention of the stomach. No dilated small bowel loops. Mild stool and gas in the large bowel. No evidence of pneumatosis or pneumoperitoneum. Mild hazy left lung base opacity. No radiopaque nephrolithiasis. IMPRESSION: Nonspecific moderate gaseous distention of the stomach. No evidence of small or large bowel obstruction. Mild hazy left lung base opacity, please see the separate concurrent chest radiograph report for details. Electronically Signed   By: Ilona Sorrel M.D.   On: 01/24/2018 10:08    Cardiac Studies   Cardiac catheterization (01/22/2018)  Conclusion     Mid Cx lesion is 75% stenosed.  Prox LAD to Mid LAD lesion is 65% stenosed.  Dist RCA lesion is 20% stenosed.  Mid RCA lesion is 30% stenosed.  Prox RCA to Mid RCA lesion is 100% stenosed.  Post intervention, there is a 0% residual stenosis.  A stent was successfully placed.   Acute ST segment elevation myocardial infarction secondary to total occlusion of a calcified mid right coronary artery with thrombus formation with initial TIMI 0 flow.  Coronary calcification with multivessel CAD and additional 60-70% calcified stenosis of the mid LAD and 75% stenoses of the left circumflex vessel.  Difficult but successful percutaneous  coronary intervention to the totally occluded calcified mid RCA with PTCA, and ultimate insertion of a 3.024 mm Synergy DES stent postdilated to 3.25 mm with the 100% occlusion being reduced to 0% and restoration of brisk TIMI-3 flow.  RECOMMENDATION: Dual antiplatelet therapy for minimum of 1 year and probably indefinitely.  Continue Aggrastat for 18 hours post procedure.  Initial medical therapy for concomitant CAD.  Due to continued hypoxemia, the patient may benefit from BiPAP therapy in the CCU with continued IV diuresis.  A 2-D echo Doppler study will be scheduled to assess LV function and valvular architecture.  High potency statin therapy, and guideline directed optimal post MI treatment.   2D echocardiogram (01/23/2018)  Study Conclusions  - Left ventricle: The cavity size was mildly dilated. Systolic   function was mildly to moderately reduced. The estimated ejection   fraction was in the range of 40% to 45%. Diffuse hypokinesis.   Doppler parameters are consistent with abnormal left ventricular   relaxation (grade 1 diastolic dysfunction). - Mitral valve: There was mild regurgitation. - Left atrium: The atrium was mildly dilated. - Right ventricle: The cavity size was moderately dilated. Wall   thickness was normal. - Tricuspid valve: There was mild regurgitation. - Pulmonary arteries: Systolic pressure was mildly increased. PA   peak pressure: 35 mm Hg (S  Patient Profile     84 y.o. male with a history of hypertension, diabetes and hyperlipidemia but no prior cardiac history who had inferior STEMI 2 days ago treated with PCI and drug-eluting stenting via the right radial approach by Dr. Claiborne Billings with an excellent result.  The lesion was thrombotic.  He had moderate disease in his LAD and circumflex.  His troponin only increased to 2.75.  His EKG shows residual slight ST segment elevation in inferior leads.  He did go into A. fib with RVR which has since converted back to  sinus  rhythm this morning.  He has been placed on IV heparin although was having significant HEENT from his NG tube which has since been discontinued.Marland Kitchen  He still requiring 3 L of oxygen with sats in the low 90 range although he is much more comfortable and breathing better.Marland Kitchen  He is also had some abdominal distention as well with a large gastric air bubble on abdominal flatplate.  NG tube was placed which successfully decompressed him.  His serum creatinine has increased from 1.2 up to 1.6---> 1.9  with IV diuresis.  Diuretics were held, renal consult was obtained.  Serum creatinine has now reversed and is declining in the 1.4 range.  Because of the ventricular tachycardia which is fairly incessant yesterday he underwent cardiac catheterization this morning revealing unchanged anatomy.  He did convert to sinus rhythm.  He had no further arrhythmias since been put being placed on amiodarone and IV lidocaine.  He also had heme from his NG tube.  His IV heparin was discontinued.  Assessment & Plan    1: Inferior STEMI- postop day 4 inferior STEMI treated with PCI and drug-eluting stenting of the mid RCA with a synergy 3 mm x 24 mm long drug-eluting stent.  His troponin only increased to 2.75.  He is on dual antiplatelet therapy.  A 2D echo revealed an ejection fraction of 40% with grade 1 diastolic dysfunction and a PA pressure 35 mmHg.  Marland Kitchen  Plan for medical treatment of his residual CAD.  He underwent relook cardiac catheterization this morning because of his ventricular tachycardia to rule out an ischemic substrate with unchanged anatomy.  2: Essential hypertension- blood pressure controlled on metoprolol  3: Hyperlipidemia- on high-dose statin therapy  4: Hypoxemia- patient was relatively hypoxemic on 7 L of oxygen with a sat of 94%.  This has since been weaned down to 3 L.  He did diurese half a liter on IV Lasix although his serum creatinine increased from 1.2->1.4-> 1.6--> 1.9   .  We will discontinue his  diuretics.  Dr. [place] Bells is aware and following.  His chest x-ray showed only mild CHF which was improving.  Abdominal flatplate did show a large gastric air bubble.  An NG tube was placed and this was successfully decompressed.  He did have a significant amount of heme from his NG tube.  Based on this his IV heparin was discontinued.  He currently is on 4 L with a oxygen saturation in the mid 90s.  5.  Atrial fibrillation- patient was in A. fib on IV Cardizem.  Cardizem was discontinued.  His A. fib converted yesterday in the Cath Lab to sinus rhythm.  Because of his heme positive NG aspirate we will discontinue the IV heparin and place him on Protonix.  6: Renal insufficiency- a renal consult was obtained.  Dr. Posey Pronto left an excellent note.  His peak creatinine was in the 1 9 range and this has since declined to the 1 4 range.  Diuretics were held.  He does not appear to be volume overloaded today.  I suspect his transient renal insufficiency was multifactorial from hemodynamic compromise, radiocontrast nephropathy and diuresis.  We will plan on stopping his lidocaine today.  We will continue his amiodarone for 24 hours IV and transition him to a p.o. load per EP recommendations.  We will get physical therapy does see him and to begin ambulation and mobilization.  We will discontinue his IV metoprolol and begin him on 50 mg p.o. twice  daily following his heart rate and blood pressure.  Per EP recommendations he will need a LifeVest placed at discharge if he has no recurrent arrhythmias.  If he has recurrent ventricular arrhythmias he may need an ICD for secondary prevention.  Given the fact that his A. fib was in the setting of a inferior STEMI and hypoxemia, converted to sinus rhythm in short order I am not sure he requires long-term oral and a coagulation at his age especially given that he is on dual antiplatelet therapy for his recent stent.  For questions or updates, please contact [facility] [place] Please consult [email or web address] for contact info under Cardiology/STEMI.      Signed, Quay Burow, MD  01/26/2018, 8:48 AM

## 2018-01-26 NOTE — Progress Notes (Addendum)
Electrophysiology Rounding Note  Patient Name: Daryl Rodriguez Date of Encounter: 01/26/2018  Primary Cardiologist: Claiborne Billings Electrophysiologist: Lovena Le   Subjective   The patient denies chest pain or shortness of breath. +nausea and vomiting overnight   Inpatient Medications    Scheduled Meds: . aspirin EC  81 mg Oral Daily  . atorvastatin  80 mg Oral q1800  . docusate sodium  100 mg Oral Daily  . isosorbide mononitrate  30 mg Oral Daily  . mouth rinse  15 mL Mouth Rinse BID  . metoprolol tartrate  5 mg Intravenous Q6H  . pantoprazole  40 mg Oral BID  . patiromer  8.4 g Oral Daily  . senna-docusate  2 tablet Oral BID  . ticagrelor  90 mg Oral BID   Continuous Infusions: . sodium chloride Stopped (01/23/18 1400)  . amiodarone 30 mg/hr (01/26/18 0700)  . lidocaine 2 mg/min (01/26/18 0700)   PRN Meds: sodium chloride, acetaminophen, alum & mag hydroxide-simeth, loperamide, nitroGLYCERIN, ondansetron (ZOFRAN) IV, prochlorperazine, sodium chloride flush, zolpidem   Vital Signs    Vitals:   01/26/18 0448 01/26/18 0500 01/26/18 0600 01/26/18 0700  BP:  132/65 139/73 (!) 158/67  Pulse:  79 73 80  Resp:  15 15 16   Temp: 98.3 F (36.8 C)     TempSrc: Oral     SpO2:  94% 95% 95%  Weight:      Height:        Intake/Output Summary (Last 24 hours) at 01/26/2018 0734 Last data filed at 01/26/2018 0700 Gross per 24 hour  Intake 1690.5 ml  Output 1150 ml  Net 540.5 ml   Filed Weights   01/22/18 1830 01/22/18 1845 01/25/18 0500  Weight: 180 lb (81.6 kg) 226 lb 3.1 oz (102.6 kg) 209 lb 14.1 oz (95.2 kg)    Physical Exam    GEN- The patient is elderly appearing, alert and oriented x 3 today.   Head- normocephalic, atraumatic Eyes-  Sclera clear, conjunctiva pink Ears- hearing intact Oropharynx- clear Neck- supple Lungs- Clear to ausculation bilaterally, normal work of breathing Heart- regular rate and rhythm  GI- soft, NT, ND, + BS Extremities- no clubbing,  cyanosis, or edema Skin- no rash or lesion Psych- euthymic mood, full affect Neuro- strength and sensation are intact  Labs    CBC Recent Labs    01/25/18 0233 01/26/18 0244  WBC 5.5 10.7*  HGB 15.4 8.1*  HCT 45.8 24.7*  MCV 92.7 95.0  PLT 146* 161   Basic Metabolic Panel Recent Labs    01/24/18 1202 01/24/18 1927 01/25/18 0233 01/26/18 0244  NA  --  137 137 135  K  --  3.8 5.3* 3.8  CL  --  99* 101 99*  CO2  --  23 23 26   GLUCOSE  --  244* 237* 231*  BUN  --  52* 60* 50*  CREATININE  --  1.78* 1.89* 1.48*  CALCIUM  --  9.1 9.0 8.6*  MG 2.1 2.0  --   --   PHOS  --   --   --  3.4   Liver Function Tests Recent Labs    01/26/18 0244  ALBUMIN 2.6*   No results for input(s): LIPASE, AMYLASE in the last 72 hours. Cardiac Enzymes Recent Labs    01/24/18 1927  TROPONINI 3.19*     Telemetry    SR w PVC's (personally reviewed)  Radiology    US Renal  Result Date: 01/25/2018 CLINICAL DATA:  Acute on chronic  renal failure EXAM: RENAL / URINARY TRACT ULTRASOUND COMPLETE COMPARISON:  CT 04/19/2011 FINDINGS: Right Kidney: Length: 9.9 cm. Echogenicity within normal limits. No mass or hydronephrosis visualized. Left Kidney: Length: 10.4 cm. Echogenicity within normal limits. No mass or hydronephrosis visualized. Bladder: Not well seen, probably empty. IMPRESSION: Ultrasound appearance of the kidneys is within normal limits Electronically Signed   By: Donavan Foil M.D.   On: 01/25/2018 23:22   Dg Chest Port 1 View  Result Date: 01/24/2018 CLINICAL DATA:  Acute respiratory failure with hypoxia and dyspnea EXAM: PORTABLE CHEST 1 VIEW COMPARISON:  01/22/2018 chest radiograph. FINDINGS: Stable cardiomediastinal silhouette with mild cardiomegaly. No pneumothorax. No right pleural effusion. Small left pleural effusion appears increased. Mild hazy and linear parahilar lung opacities, improved in the interval. Patchy left lung base opacity with decreased lung volumes.  IMPRESSION: 1. Mild congestive heart failure, improved. 2. Small left pleural effusion, increased. 3. Decreased lung volumes with patchy left lung base opacity, favor atelectasis. Electronically Signed   By: Ilona Sorrel M.D.   On: 01/24/2018 10:10   Dg Abd Portable 1v  Result Date: 01/24/2018 CLINICAL DATA:  Check nasogastric catheter placement EXAM: PORTABLE ABDOMEN - 1 VIEW COMPARISON:  01/24/2018 FINDINGS: Nasogastric catheter is now noted coiled within the stomach. The stomach is decompressed when compared with the previous exam. Postsurgical changes are again noted. IMPRESSION: Nasogastric catheter within the stomach. Electronically Signed   By: Inez Catalina M.D.   On: 01/24/2018 11:52   Dg Abd Portable 1v  Result Date: 01/24/2018 CLINICAL DATA:  Abdominal pain and distention EXAM: PORTABLE ABDOMEN - 1 VIEW COMPARISON:  04/19/2011 CT abdomen/pelvis FINDINGS: Bilateral lumbar spinal fusion hardware overlies the lower lumbar spine. There is moderate gaseous distention of the stomach. No dilated small bowel loops. Mild stool and gas in the large bowel. No evidence of pneumatosis or pneumoperitoneum. Mild hazy left lung base opacity. No radiopaque nephrolithiasis. IMPRESSION: Nonspecific moderate gaseous distention of the stomach. No evidence of small or large bowel obstruction. Mild hazy left lung base opacity, please see the separate concurrent chest radiograph report for details. Electronically Signed   By: Ilona Sorrel M.D.   On: 01/24/2018 10:08    Assessment & Plan    1.  Ventricular tachycardia Stable since yesterday Stop IV lidocaine today Continue IV amiodarone for another 24 hours then convert to po (400mg  bid x1 week, 200mg  bid x2 weeks, 200mg  daily) Keep K>3.9, Mg >1.8 If he has recurrent VT, will need to consider secondary prevention ICD this admission.  If no recurrence, would send home with LifeVest No driving x6 months  2.  New onset atrial fibrillation CHADS2VASC is 5 Will  need long term anticoagulation Converted to SR yesterday with cath   3.  CAD/Inferior STEMI S/p DES to RCA Per Dr Gwenlyn Found  Signed, Chanetta Marshall, NP  01/26/2018, 7:34 AM   EP Attending  Patient seen and examined. Agree with above. He appears improved and his has reverted back to NSR from atrial fib. His VT has quieted down. Exam reveals a pleasant elderly man, NAD, scattered rales on lung exam but no increased work of breathing, RRR, warm extremities. I would recommend stopping the IV lidocaine today, transitioning to oral amio tomorrow if no more VT, and possibly DC home on Sunday or Monday with a Life Vest. If he has more VT, then I would suggest implant of an ICD prior to DC home. EP service will follow over the weekend as well.  [name].D.

## 2018-01-26 NOTE — Progress Notes (Signed)
PULMONARY / CRITICAL CARE MEDICINE   Name: Amy Belloso MRN: [id number] DOB: 1933/10/12    ADMISSION DATE:  01/22/2018 CONSULTATION DATE:  01/22/2018  REFERRING MD:  Claiborne Billings  CHIEF COMPLAINT:  dyspnea  HISTORY OF PRESENT ILLNESS:   84 y/o male admitted on 5/20 with a STEMI, had acute pulmonary edema and acute respiratory failure with hypoxemia.   SUBJECTIVE:  No further blood seen off heparin, no hemoptysis or hematemesis, no epistaxis   VITAL SIGNS: BP (!) 158/67   Pulse 80   Temp (!) 97.4 F (36.3 C) (Axillary)   Resp 16   Ht 5\' 9"  (1.753 m)   Wt 95.2 kg (209 lb 14.1 oz)   SpO2 95%   BMI 30.99 kg/m   HEMODYNAMICS:    VENTILATOR SETTINGS:    INTAKE / OUTPUT: I/O last 3 completed shifts: In: 2699 [P.O.:600; I.V.:2099] Out: 2280 [Urine:2180; Emesis/NG output:100]  PHYSICAL EXAMINATION: General:  Ill appearing man, comfortable, up to chair, more tired today HEENT: OP clear, no lesions Neuro: awake, appropriate, non-focal CV: regular, no M PULM: decreased at bases but clear KV:QQVZ, non tender, + BS Extremities:1+ LE edema Skin: no rash  LABS:  BMET Recent Labs  Lab 01/24/18 1927 01/25/18 0233 01/26/18 0244  NA 137 137 135  K 3.8 5.3* 3.8  CL 99* 101 99*  CO2 23 23 26   BUN 52* 60* 50*  CREATININE 1.78* 1.89* 1.48*  GLUCOSE 244* 237* 231*    Electrolytes Recent Labs  Lab 01/24/18 1202 01/24/18 1927 01/25/18 0233 01/26/18 0244  CALCIUM  --  9.1 9.0 8.6*  MG 2.1 2.0  --   --   PHOS  --   --   --  3.4    CBC Recent Labs  Lab 01/24/18 0004 01/25/18 0233 01/26/18 0244  WBC 10.9* 5.5 10.7*  HGB 10.5* 15.4 8.1*  HCT 32.2* 45.8 24.7*  PLT 272 146* 253    Coag's Recent Labs  Lab 01/22/18 1632  APTT 33  INR 1.19    Sepsis Markers No results for input(s): LATICACIDVEN, PROCALCITON, O2SATVEN in the last 168 hours.  ABG Recent Labs  Lab 01/22/18 1645  PHART 7.330*  PCO2ART 39.8  PO2ART 53.0*    Liver Enzymes Recent Labs   Lab 01/22/18 1632 01/23/18 0258 01/26/18 0244  AST 22 35  --   ALT 24 30  --   ALKPHOS 65 57  --   BILITOT 1.2 1.3*  --   ALBUMIN 3.0* 2.6* 2.6*    Cardiac Enzymes Recent Labs  Lab 01/22/18 1632 01/22/18 1934 01/24/18 1927  TROPONINI 1.80* 2.75* 3.19*    Glucose Recent Labs  Lab 01/24/18 2057 01/24/18 2336 01/25/18 0311 01/25/18 0849 01/25/18 1211 01/25/18 1612  GLUCAP 256* 268* 208* 152* 147* 159*    Imaging US Renal  Result Date: 01/25/2018 CLINICAL DATA:  Acute on chronic renal failure EXAM: RENAL / URINARY TRACT ULTRASOUND COMPLETE COMPARISON:  CT 04/19/2011 FINDINGS: Right Kidney: Length: 9.9 cm. Echogenicity within normal limits. No mass or hydronephrosis visualized. Left Kidney: Length: 10.4 cm. Echogenicity within normal limits. No mass or hydronephrosis visualized. Bladder: Not well seen, probably empty. IMPRESSION: Ultrasound appearance of the kidneys is within normal limits Electronically Signed   By: Donavan Foil M.D.   On: 01/25/2018 23:22   Dg Chest Port 1 View  Result Date: 01/26/2018 CLINICAL DATA:  Shortness of breath EXAM: PORTABLE CHEST 1 VIEW COMPARISON:  01/24/2018 FINDINGS: Cardiac shadow is stable. Aortic calcifications are again seen. Increasing vascular  congestion and interstitial edema is noted consistent with worsening CHF. No focal confluent infiltrate is seen. No bony abnormality is noted. IMPRESSION: Worsening CHF. Electronically Signed   By: Inez Catalina M.D.   On: 01/26/2018 07:39     STUDIES:  5/20 LHC > RCA occlusion, PCI 5/21 ECHO > LVEF 40-45%, diffuse hypokinesis, grade 1 diastolic dysfunction, PA peak 72mmHg  CULTURES:   ANTIBIOTICS:   SIGNIFICANT EVENTS: 5/20 Admit with chest pain, LHC, BIPAP  LINES/TUBES:   DISCUSSION: 84 y/o male with acute pulmonary edema after a STEMI.  Course complicated by wide-complex tachycardia, cardioverted x2 on 5/22  ASSESSMENT / PLAN:  PULMONARY A: Acute respiratory faliure with  hypoxemia Acute pulmonary edema N/V Episode on BiPAP  OSA - on CPAP at home P:   Continue pulm hygiene Diuresis on hold, resp status improved now on RA despite persistent infiltrates on CXR 5/24; consider restart when S Cr will allow     CARDIOVASCULAR A:  STEMI VT versus SVT Atrial fibrillation, now in sinus rhythm P:  ASA, brilinta, metop, imdur per cardiology plans Heparin held Amiodarone and lidocaine per Dr Gwenlyn Found; EP to eval Weaned dilt to off   RENAL A:   Mild renal insufficiency - 2016 sr cr 0.9-1 P:   Diuresis on hold, likely restart once renal fxn stabilizes.  Follow UOP, BMP Replace electrolytes as indicated.  GI  A: Abdominal Discomfort, Diarrhea- r/ule out ileus Prior N/V P: Improved, NGT out  NEURO A: Toxic-metabolic encephalopathy, improved P: Minimize sedating meds.   FAMILY  - Updates:  Patient updated on plan of care 5/23 and 5/24  PCCM will sign off. PLease call if we can assist further.   Baltazar Apo, MD, PhD 01/26/2018, 9:37 AM [facility] [phone] or if no answer [phone]

## 2018-01-26 NOTE — Progress Notes (Signed)
Pt just walked earlier, did well with OT. Just bathed with tech. Discussed MI, stent, Brilinta, and CRPII. Will send referral to [place]. Will f/u tomorrow for ambulation and education. [place] CES, ACSM 3:17 PM 01/26/2018

## 2018-01-26 NOTE — Evaluation (Signed)
Occupational Therapy Evaluation Patient Details Name: Daryl Rodriguez MRN: [id number] DOB: 10-May-1934 Today's Date: 01/26/2018    History of Present Illness 84 y.o. admitted with SOB and BLE edema for past 2 weeks with chest pain. Stent placement in RCA 5/20, 5/22 devolved v tach requiring cardioversion 5/23. PMH includes: HTN, DM2, CKD III, Peripheral vascular disease, Wrist surgery, back surgery.    Clinical Impression   Pt admitted with above. He demonstrates the below listed deficits and will benefit from continued OT to maximize safety and independence with BADLs.  Pt presents to OT with generalized weakness, decreased activity tolerance, mild balance deficits.  He requires min A, overall, for ADLs and min guard assist for functional mobility.   HR 89, 02 sats 97% on 4L supplemental 02, BP 154/69.   He lives with wife, who recently sustained a hip fx and just returned home from rehab.  Children are supportive and able to assist.  Anticipate good progress.  Pt was mod I with ADLs PTA      Follow Up Recommendations  Home health OT;Supervision/Assistance - 24 hour    Equipment Recommendations  None recommended by OT    Recommendations for Other Services       Precautions / Restrictions Precautions Precautions: Fall Precaution Comments: V Tach 5/22       Mobility Bed Mobility               General bed mobility comments: up in chair   Transfers Overall transfer level: Needs assistance Equipment used: Rolling walker (2 wheeled) Transfers: Sit to/from [facility] Sit to Stand: Min guard Stand pivot transfers: Min assist       General transfer comment: min guard for safety     Balance Overall balance assessment: Needs assistance Sitting-balance support: Feet supported Sitting balance-Leahy Scale: Fair       Standing balance-Leahy Scale: Fair                             ADL either performed or assessed with clinical judgement   ADL  Overall ADL's : Needs assistance/impaired Eating/Feeding: Independent   Grooming: Wash/dry hands;Wash/dry face;Oral care;Brushing hair;Min guard;Standing   Upper Body Bathing: Supervision/ safety;Set up;Sitting   Lower Body Bathing: Minimal assistance;Sit to/from stand   Upper Body Dressing : Set up;Supervision/safety;Sitting   Lower Body Dressing: Sit to/from stand;Minimal assistance   Toilet Transfer: Min guard;Ambulation;Comfort height toilet;Grab bars;RW   Toileting- Clothing Manipulation and Hygiene: Minimal assistance;Sit to/from stand       Functional mobility during ADLs: Min guard;Rolling walker General ADL Comments: Pt moves slowly and requires increased time      Vision Baseline Vision/History: Wears glasses Wears Glasses: At all times Patient Visual Report: No change from baseline       Perception     Praxis      Pertinent Vitals/Pain Pain Assessment: No/denies pain     Hand Dominance Right   Extremity/Trunk Assessment Upper Extremity Assessment Upper Extremity Assessment: Overall WFL for tasks assessed   Lower Extremity Assessment Lower Extremity Assessment: Defer to PT evaluation   Cervical / Trunk Assessment Cervical / Trunk Assessment: Kyphotic(capital extension of head/neck )   Communication Communication Communication: No difficulties   Cognition Arousal/Alertness: Awake/alert Behavior During Therapy: Flat affect Overall Cognitive Status: Within Functional Limits for tasks assessed  General Comments  BP 154/69; HR 89, and 02 sats 97% on 4L supplemental 02    Exercises     Shoulder Instructions      Home Living Family/patient expects to be discharged to:: Private residence Living Arrangements: Spouse/significant other Available Help at Discharge: Family Type of Home: House Home Access: Stairs to enter Technical brewer of Steps: 2 Entrance Stairs-Rails: Can reach both Home  Layout: One level     Bathroom Shower/Tub: Teacher, early years/pre: Standard Bathroom Accessibility: Yes   Home Equipment: Walker - 2 wheels;Grab bars - toilet;Grab bars - tub/shower;Shower seat - built in   Additional Comments: Pt's wife just out of rehab due to hip fracture.  Children are assisting       Prior Functioning/Environment Level of Independence: Independent        Comments: Pt reports he fatigues with grocery shopping, does very little yard work.  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         OT Problem List: Decreased strength;Decreased activity tolerance;Impaired balance (sitting and/or standing);Decreased knowledge of use of DME or AE;Cardiopulmonary status limiting activity      OT Treatment/Interventions: Self-care/ADL training;DME and/or AE instruction;Energy conservation;Therapeutic activities;Patient/family education;Balance training    OT Goals(Current goals can be found in the care plan section) Acute Rehab OT Goals Patient Stated Goal: To get back to normal and play guitar  OT Goal Formulation: With patient Time For Goal Achievement: 02/09/18 Potential to Achieve Goals: Good ADL Goals Pt Will Perform Grooming: with modified independence;standing Pt Will Perform Upper Body Bathing: with modified independence;sitting;standing Pt Will Perform Lower Body Bathing: with modified independence;sit to/from stand Pt Will Perform Upper Body Dressing: with modified independence;sitting;standing Pt Will Perform Lower Body Dressing: with modified independence;sit to/from stand Pt Will Transfer to Toilet: with modified independence;ambulating;regular height toilet;grab bars Pt Will Perform Toileting - Clothing Manipulation and hygiene: with modified independence;sit to/from stand Pt Will Perform Tub/Shower Transfer: Shower transfer;with modified independence;ambulating;shower seat;grab bars;rolling walker Additional ADL  Goal #1: Pt will be independent with energy conservation techniques during ADLs  OT Frequency: Min 2X/week   Barriers to D/C:            Co-evaluation              AM-PAC PT "6 Clicks" Daily Activity     Outcome Measure Help from another person eating meals?: None Help from another person taking care of personal grooming?: A Little Help from another person toileting, which includes using toliet, bedpan, or urinal?: A Little Help from another person bathing (including washing, rinsing, drying)?: A Little Help from another person to put on and taking off regular upper body clothing?: A Little Help from another person to put on and taking off regular lower body clothing?: A Little 6 Click Score: 19   End of Session Equipment Utilized During Treatment: Rolling walker;Gait belt;Oxygen Nurse Communication: Mobility status  Activity Tolerance: Patient limited by fatigue Patient left: in chair;with call bell/phone within reach;with chair alarm set;with family/visitor present  OT Visit Diagnosis: Unsteadiness on feet (R26.81)                Time: 9509-3267 OT Time Calculation (min): 34 min Charges:  OT General Charges $OT Visit: 1 Visit OT Evaluation $OT Eval Moderate Complexity: 1 Mod OT Treatments $Therapeutic Activity: 8-22 mins G-Codes:     [facility], OTR/L [phone]   Daryl Rodriguez 01/26/2018, 1:16 PM

## 2018-01-26 NOTE — Care Management Note (Signed)
Case Management Note  Patient Details  Name: Daryl Rodriguez MRN: [id number] Date of Birth: [birth year]  Subjective/Objective:              Spoke w patient, his wife is currently using [facility], he would like to at [facility].      Action/Plan:  Need HH orders and referral made to [facility].  Expected Discharge Date:                  Expected Discharge Plan:  [name]  In-House Referral:     Discharge planning Services  CM Consult, Medication Assistance  Post Acute Care Choice:  Home Health Choice offered to:  Patient  DME Arranged:    DME Agency:     HH Arranged:    [place] Agency:  [place]  Status of Service:  In process, will continue to follow  If discussed at Long Length of Stay Meetings, dates discussed:    Additional Comments:  Carles Collet, RN 01/26/2018, 2:46 PM

## 2018-01-27 DIAGNOSIS — I481 Persistent atrial fibrillation: Secondary | ICD-10-CM

## 2018-01-27 DIAGNOSIS — E876 Hypokalemia: Secondary | ICD-10-CM

## 2018-01-27 LAB — RENAL FUNCTION PANEL
ANION GAP: 8 (ref 5–15)
Albumin: 2.4 g/dL — ABNORMAL LOW (ref 3.5–5.0)
BUN: 37 mg/dL — ABNORMAL HIGH (ref 6–20)
CALCIUM: 8.6 mg/dL — AB (ref 8.9–10.3)
CO2: 26 mmol/L (ref 22–32)
Chloride: 104 mmol/L (ref 101–111)
Creatinine, Ser: 1.24 mg/dL (ref 0.61–1.24)
GFR, EST NON AFRICAN AMERICAN: 53 mL/min — AB (ref >60)
Glucose, Bld: 71 mg/dL (ref 65–99)
Phosphorus: 2.8 mg/dL (ref 2.5–4.6)
Potassium: 3.4 mmol/L — ABNORMAL LOW (ref 3.5–5.1)
SODIUM: 138 mmol/L (ref 135–145)

## 2018-01-27 LAB — CBC
HCT: 24.9 % — ABNORMAL LOW (ref 39.0–52.0)
HEMOGLOBIN: 8.1 g/dL — AB (ref 13.0–17.0)
MCH: 31.3 pg (ref 26.0–34.0)
MCHC: 32.5 g/dL (ref 30.0–36.0)
MCV: 96.1 fL (ref 78.0–100.0)
Platelets: 260 10*3/uL (ref 150–400)
RBC: 2.59 MIL/uL — AB (ref 4.22–5.81)
RDW: 13.9 % (ref 11.5–15.5)
WBC: 10.2 10*3/uL (ref 4.0–10.5)

## 2018-01-27 LAB — MAGNESIUM: MAGNESIUM: 1.9 mg/dL (ref 1.7–2.4)

## 2018-01-27 MED ORDER — AMIODARONE IV BOLUS ONLY 150 MG/100ML
150.0000 mg | Freq: Once | INTRAVENOUS | Status: AC
  Administered 2018-01-27: 150 mg via INTRAVENOUS

## 2018-01-27 MED ORDER — MAGNESIUM SULFATE 2 GM/50ML IV SOLN
2.0000 g | Freq: Once | INTRAVENOUS | Status: AC
  Administered 2018-01-27: 2 g via INTRAVENOUS
  Filled 2018-01-27: qty 50

## 2018-01-27 MED ORDER — POTASSIUM CHLORIDE CRYS ER 20 MEQ PO TBCR
40.0000 meq | EXTENDED_RELEASE_TABLET | Freq: Once | ORAL | Status: AC
  Administered 2018-01-27: 40 meq via ORAL
  Filled 2018-01-27: qty 2

## 2018-01-27 NOTE — Progress Notes (Signed)
DAILY PROGRESS NOTE   Patient Name: Daryl Rodriguez Date of Encounter: 01/27/2018  Chief Complaint   No complaints  Patient Profile   84 y.o. male with a history of hypertension, diabetes and hyperlipidemia but no prior cardiac history who had inferior STEMI 2 days ago treated with PCI and drug-eluting stenting via the right radial approach by Dr. Claiborne Billings with an excellent result.  The lesion was thrombotic.  He had moderate disease in his LAD and circumflex.  His troponin only increased to 2.75.  His EKG shows residual slight ST segment elevation in inferior leads.  He did go into A. fib with RVR which has since converted back to sinus rhythm this morning.  He has been placed on IV heparin although was having significant HEENT from his NG tube which has since been discontinued.Marland Kitchen  He still requiring 3 L of oxygen with sats in the low 90 range although he is much more comfortable and breathing better.Marland Kitchen  He is also had some abdominal distention as well with a large gastric air bubble on abdominal flatplate.  NG tube was placed which successfully decompressed him.  His serum creatinine has increased from 1.2 up to 1.6---> 1.9  with IV diuresis.  Diuretics were held, renal consult was obtained.  Serum creatinine has now reversed and is declining in the 1.4 range.  Because of the ventricular tachycardia which is fairly incessant yesterday he underwent cardiac catheterization this morning revealing unchanged anatomy.  He did convert to sinus rhythm.  He had no further arrhythmias since been put being placed on amiodarone and IV lidocaine.  He also had heme from his NG tube.  His IV heparin was discontinued.  Subjective   Noted to be in a-fib with RVR or possibly flutter with variable rate in the 110-130's. On IV amiodarone at 30 mg/hr. No chest pain.  Objective   Vitals:   01/27/18 0700 01/27/18 0800 01/27/18 0803 01/27/18 0900  BP: 128/67 (!) 141/72  (!) 157/96  Pulse: 91 (!) 112  (!) 114  Resp: 14  (!) 21  17  Temp:   97.7 F (36.5 C)   TempSrc:   Oral   SpO2: 98% 93%  95%  Weight:      Height:        Intake/Output Summary (Last 24 hours) at 01/27/2018 0934 Last data filed at 01/27/2018 0900 Gross per 24 hour  Intake 1100.81 ml  Output 1230 ml  Net -129.19 ml   Filed Weights   01/22/18 1845 01/25/18 0500 01/27/18 0600  Weight: 226 lb 3.1 oz (102.6 kg) 209 lb 14.1 oz (95.2 kg) 205 lb 14.6 oz (93.4 kg)    Physical Exam   General appearance: alert and no distress Neck: no carotid bruit, no JVD and thyroid not enlarged, symmetric, no tenderness/mass/nodules Lungs: clear to auscultation bilaterally Heart: irregularly irregular rhythm and tachycardic Abdomen: soft, non-tender; bowel sounds normal; no masses,  no organomegaly Extremities: extremities normal, atraumatic, no cyanosis or edema Pulses: 2+ and symmetric Skin: Skin color, texture, turgor normal. No rashes or lesions Neurologic: Grossly normal Psych: Pleasant  Inpatient Medications    Scheduled Meds: . aspirin EC  81 mg Oral Daily  . atorvastatin  80 mg Oral q1800  . docusate sodium  100 mg Oral Daily  . glimepiride  4 mg Oral Q breakfast  . isosorbide mononitrate  30 mg Oral Daily  . latanoprost  1 drop Both Eyes QHS  . mouth rinse  15 mL Mouth Rinse BID  .  metoprolol tartrate  50 mg Oral BID  . pantoprazole  40 mg Oral BID  . senna-docusate  2 tablet Oral BID  . ticagrelor  90 mg Oral BID    Continuous Infusions: . sodium chloride Stopped (01/23/18 1400)  . amiodarone 30 mg/hr (01/27/18 0900)    PRN Meds: sodium chloride, acetaminophen, alum & mag hydroxide-simeth, loperamide, nitroGLYCERIN, ondansetron (ZOFRAN) IV, prochlorperazine, sodium chloride flush, zolpidem   Labs   Results for orders placed or performed during the hospital encounter of 01/22/18 (from the past 48 hour(s))  Glucose, capillary     Status: Abnormal   Collection Time: 01/25/18 12:11 PM  Result Value Ref Range    Glucose-Capillary 147 (H) 65 - 99 mg/dL   Comment 1 Capillary Specimen   Creatinine, urine, random     Status: None   Collection Time: 01/25/18  3:02 PM  Result Value Ref Range   Creatinine, Urine 85.48 mg/dL    Comment: Performed at [facility], [place] [address], [place], [place] [address]  Sodium, urine, random     Status: None   Collection Time: 01/25/18  3:02 PM  Result Value Ref Range   Sodium, Ur 35 mmol/L    Comment: Performed at [facility] [address] [place], [place] [address]  Urinalysis, Routine w reflex microscopic     Status: Abnormal   Collection Time: 01/25/18  3:02 PM  Result Value Ref Range   Color, Urine YELLOW YELLOW   APPearance HAZY (A) CLEAR   Specific Gravity, Urine 1.027 1.005 - 1.030   pH 5.0 5.0 - 8.0   Glucose, UA NEGATIVE NEGATIVE mg/dL   Hgb urine dipstick MODERATE (A) NEGATIVE   Bilirubin Urine NEGATIVE NEGATIVE   Ketones, ur NEGATIVE NEGATIVE mg/dL   Protein, ur NEGATIVE NEGATIVE mg/dL   Nitrite NEGATIVE NEGATIVE   Leukocytes, UA TRACE (A) NEGATIVE   RBC / HPF 0-5 0 - 5 RBC/hpf   WBC, UA 11-20 0 - 5 WBC/hpf   Bacteria, UA RARE (A) NONE SEEN   Squamous Epithelial / LPF 0-5 0 - 5   Mucus PRESENT    Triple Phosphate Crystal PRESENT     Comment: Performed at [facility], [place] [address] [place], [place] [address]  Urea nitrogen, urine     Status: None   Collection Time: 01/25/18  3:03 PM  Result Value Ref Range   Urea Nitrogen, Ur 1,031 Not Estab. mg/dL    Comment: (NOTE) Performed At: [facility] [place], [place] [id number] Rush Farmer MD [id number] Performed at [facility], [place] [address] [place], [place] [address]   Glucose, capillary     Status: Abnormal   Collection Time: 01/25/18  4:12 PM  Result Value Ref Range   Glucose-Capillary 159 (H) 65 - 99 mg/dL   Comment 1 Capillary Specimen   CBC     Status: Abnormal   Collection Time: 01/26/18  2:44 AM  Result Value Ref Range     WBC 10.7 (H) 4.0 - 10.5 K/uL   RBC 2.60 (L) 4.22 - 5.81 MIL/uL   Hemoglobin 8.1 (L) 13.0 - 17.0 g/dL    Comment: DELTA CHECK NOTED REPEATED TO VERIFY    HCT 24.7 (L) 39.0 - 52.0 %   MCV 95.0 78.0 - 100.0 fL   MCH 31.2 26.0 - 34.0 pg   MCHC 32.8 30.0 - 36.0 g/dL   RDW 13.9 11.5 - 15.5 %   Platelets 253 150 -  400 K/uL    Comment: Performed at [facility], [place] [address] [place], [place] [address]  Renal function panel     Status: Abnormal   Collection Time: 01/26/18  2:44 AM  Result Value Ref Range   Sodium 135 135 - 145 mmol/L   Potassium 3.8 3.5 - 5.1 mmol/L   Chloride 99 (L) 101 - 111 mmol/L   CO2 26 22 - 32 mmol/L   Glucose, Bld 231 (H) 65 - 99 mg/dL   BUN 50 (H) 6 - 20 mg/dL   Creatinine, Ser 1.48 (H) 0.61 - 1.24 mg/dL   Calcium 8.6 (L) 8.9 - 10.3 mg/dL   Phosphorus 3.4 2.5 - 4.6 mg/dL   Albumin 2.6 (L) 3.5 - 5.0 g/dL   GFR calc non Af Amer 43 (L) >60 mL/min   GFR calc Af Amer 49 (L) >60 mL/min    Comment: (NOTE) The eGFR has been calculated using the CKD EPI equation. This calculation has not been validated in all clinical situations. eGFR's persistently <60 mL/min signify possible Chronic Kidney Disease.    Anion gap 10 5 - 15    Comment: Performed at [facility] [address], [place], [place] [address]  Magnesium     Status: None   Collection Time: 01/26/18  2:44 AM  Result Value Ref Range   Magnesium 2.0 1.7 - 2.4 mg/dL    Comment: Performed at [facility] [address] [place] [address]  CBC     Status: Abnormal   Collection Time: 01/27/18  2:59 AM  Result Value Ref Range   WBC 10.2 4.0 - 10.5 K/uL   RBC 2.59 (L) 4.22 - 5.81 MIL/uL   Hemoglobin 8.1 (L) 13.0 - 17.0 g/dL   HCT 24.9 (L) 39.0 - 52.0 %   MCV 96.1 78.0 - 100.0 fL   MCH 31.3 26.0 - 34.0 pg   MCHC 32.5 30.0 - 36.0 g/dL   RDW 13.9 11.5 - 15.5 %   Platelets 260 150 - 400 K/uL    Comment: Performed at [facility], [place] [address] [place], [place]  [address]  Renal function panel     Status: Abnormal   Collection Time: 01/27/18  2:59 AM  Result Value Ref Range   Sodium 138 135 - 145 mmol/L   Potassium 3.4 (L) 3.5 - 5.1 mmol/L   Chloride 104 101 - 111 mmol/L   CO2 26 22 - 32 mmol/L   Glucose, Bld 71 65 - 99 mg/dL   BUN 37 (H) 6 - 20 mg/dL   Creatinine, Ser 1.24 0.61 - 1.24 mg/dL   Calcium 8.6 (L) 8.9 - 10.3 mg/dL   Phosphorus 2.8 2.5 - 4.6 mg/dL   Albumin 2.4 (L) 3.5 - 5.0 g/dL   GFR calc non Af Amer 53 (L) >60 mL/min   GFR calc Af Amer >60 >60 mL/min    Comment: (NOTE) The eGFR has been calculated using the CKD EPI equation. This calculation has not been validated in all clinical situations. eGFR's persistently <60 mL/min signify possible Chronic Kidney Disease.    Anion gap 8 5 - 15    Comment: Performed at [facility] [address], [place], [place] [address]  Magnesium     Status: None   Collection Time: 01/27/18  2:59 AM  Result Value Ref Range   Magnesium 1.9 1.7 - 2.4 mg/dL    Comment: Performed at [facility], [address] [place], [place] [address]    ECG   N/A  Telemetry   A-fib/flutter with RVR - Personally Reviewed  Radiology    US Renal  Result Date: 01/25/2018 CLINICAL DATA:  Acute on chronic renal failure EXAM: RENAL / URINARY TRACT ULTRASOUND COMPLETE COMPARISON:  CT 04/19/2011 FINDINGS: Right Kidney: Length: 9.9 cm. Echogenicity within normal limits. No mass or hydronephrosis visualized. Left Kidney: Length: 10.4 cm. Echogenicity within normal limits. No mass or hydronephrosis visualized. Bladder: Not well seen, probably empty. IMPRESSION: Ultrasound appearance of the kidneys is within normal limits Electronically Signed   By: Donavan Foil M.D.   On: 01/25/2018 23:22   Dg Chest Port 1 View  Result Date: 01/26/2018 CLINICAL DATA:  Shortness of breath EXAM: PORTABLE CHEST 1 VIEW COMPARISON:  01/24/2018 FINDINGS: Cardiac shadow is stable. Aortic calcifications are again seen.  Increasing vascular congestion and interstitial edema is noted consistent with worsening CHF. No focal confluent infiltrate is seen. No bony abnormality is noted. IMPRESSION: Worsening CHF. Electronically Signed   By: Inez Catalina M.D.   On: 01/26/2018 07:39    Cardiac Studies   N/A  Assessment   1. Active Problems: 2.   ST elevation myocardial infarction involving right coronary artery ([facility]) 3.   STEMI involving right coronary artery ([place]) 4.   Acute ST elevation myocardial infarction (STEMI) of inferior wall ([facility]) 5.   Respiratory distress 6.   Acute pulmonary edema ([facility]) 7.   Acute respiratory failure ([place]) 8.   V-tach ([place]) 9.   Plan   1. Continue IV amidardone for afib/flutter. Will need LifeVest prior to d/c home. Keep in ICU Today for rate-control. EP stated they will evaluate and provide recommendations this weekend. Dr. Lovena Le is rounding. Potassium low at 3.4 this am - repleted. Magnesium 1.9. Give 2G magnesium. BP labile - elevated at times - monitor.  Time Spent Directly with Patient:  I have spent a total of 25 minutes with the patient reviewing hospital notes, telemetry, EKGs, labs and examining the patient as well as establishing an assessment and plan that was discussed personally with the patient. > 50% of time was spent in direct patient care.  Length of Stay:  LOS: 5 days   Pixie Casino, MD, [facility], [place] Director of the Advanced Lipid Disorders &  Cardiovascular Risk Reduction Clinic Diplomate of the American Board of Clinical Lipidology Attending Cardiologist  Direct Dial: [phone]  Fax: [phone]  Website:  [email or web address] 01/27/2018, 9:34 AM

## 2018-01-27 NOTE — Progress Notes (Signed)
Pt with a flutter at 130, given another 150 mg amiodarone bolus --replaced K+

## 2018-01-27 NOTE — Progress Notes (Signed)
Progress Note  Patient Name: Daryl Rodriguez Date of Encounter: 01/27/2018  Primary Cardiologist: Shelva Majestic, MD   Subjective   "I feel good today", denies chest pain or sob. Minimal palpitations  Inpatient Medications    Scheduled Meds: . aspirin EC  81 mg Oral Daily  . atorvastatin  80 mg Oral q1800  . docusate sodium  100 mg Oral Daily  . glimepiride  4 mg Oral Q breakfast  . isosorbide mononitrate  30 mg Oral Daily  . latanoprost  1 drop Both Eyes QHS  . mouth rinse  15 mL Mouth Rinse BID  . metoprolol tartrate  50 mg Oral BID  . pantoprazole  40 mg Oral BID  . senna-docusate  2 tablet Oral BID  . ticagrelor  90 mg Oral BID   Continuous Infusions: . sodium chloride Stopped (01/23/18 1400)  . amiodarone 30 mg/hr (01/27/18 1500)   PRN Meds: sodium chloride, acetaminophen, alum & mag hydroxide-simeth, loperamide, nitroGLYCERIN, ondansetron (ZOFRAN) IV, prochlorperazine, sodium chloride flush, zolpidem   Vital Signs    Vitals:   01/27/18 1400 01/27/18 1500 01/27/18 1600 01/27/18 1620  BP: 125/81 (!) 133/58 (!) 145/66   Pulse: (!) 116 69 73   Resp: (!) 23 (!) 22 (!) 22   Temp:    98.4 F (36.9 C)  TempSrc:    Oral  SpO2: 96% 97% 99%   Weight:      Height:        Intake/Output Summary (Last 24 hours) at 01/27/2018 1635 Last data filed at 01/27/2018 1600 Gross per 24 hour  Intake 1390.8 ml  Output 1030 ml  Net 360.8 ml   Filed Weights   01/22/18 1845 01/25/18 0500 01/27/18 0600  Weight: 226 lb 3.1 oz (102.6 kg) 209 lb 14.1 oz (95.2 kg) 205 lb 14.6 oz (93.4 kg)    Telemetry    nsr with PAF with RVR - Personally Reviewed  ECG    none - Personally Reviewed  Physical Exam   GEN: No acute distress.   Neck: No JVD Cardiac: RRR, no murmurs, rubs, or gallops.  Respiratory: Clear to auscultation bilaterally. GI: Soft, nontender, non-distended  MS: No edema; No deformity. Neuro:  Nonfocal  Psych: Normal affect   Labs    Chemistry Recent Labs    Lab 01/22/18 1632 01/23/18 0258  01/25/18 0233 01/26/18 0244 01/27/18 0259  NA 135  135 137   < > 137 135 138  K 4.4  4.3 4.1   < > 5.3* 3.8 3.4*  CL 101  101 102   < > 101 99* 104  CO2 21* 25   < > 23 26 26   GLUCOSE 163*  172* 148*   < > 237* 231* 71  BUN 28*  29* 30*   < > 60* 50* 37*  CREATININE 1.39*  1.20 1.43*   < > 1.89* 1.48* 1.24  CALCIUM 8.9 8.6*   < > 9.0 8.6* 8.6*  PROT 6.9 5.9*  --   --   --   --   ALBUMIN 3.0* 2.6*  --   --  2.6* 2.4*  AST 22 35  --   --   --   --   ALT 24 30  --   --   --   --   ALKPHOS 65 57  --   --   --   --   BILITOT 1.2 1.3*  --   --   --   --  GFRNONAA 46* 44*   < > 32* 43* 53*  GFRAA 53* 51*   < > 37* 49* >60  ANIONGAP 13 10   < > 13 10 8    < > = values in this interval not displayed.     Hematology Recent Labs  Lab 01/25/18 0233 01/26/18 0244 01/27/18 0259  WBC 5.5 10.7* 10.2  RBC 4.94 2.60* 2.59*  HGB 15.4 8.1* 8.1*  HCT 45.8 24.7* 24.9*  MCV 92.7 95.0 96.1  MCH 31.2 31.2 31.3  MCHC 33.6 32.8 32.5  RDW 13.8 13.9 13.9  PLT 146* 253 260    Cardiac Enzymes Recent Labs  Lab 01/22/18 1632 01/22/18 1934 01/24/18 1927  TROPONINI 1.80* 2.75* 3.19*   No results for input(s): TROPIPOC in the last 168 hours.   BNP Recent Labs  Lab 01/22/18 1934  BNP 695.6*     DDimer No results for input(s): DDIMER in the last 168 hours.   Radiology    US Renal  Result Date: 01/25/2018 CLINICAL DATA:  Acute on chronic renal failure EXAM: RENAL / URINARY TRACT ULTRASOUND COMPLETE COMPARISON:  CT 04/19/2011 FINDINGS: Right Kidney: Length: 9.9 cm. Echogenicity within normal limits. No mass or hydronephrosis visualized. Left Kidney: Length: 10.4 cm. Echogenicity within normal limits. No mass or hydronephrosis visualized. Bladder: Not well seen, probably empty. IMPRESSION: Ultrasound appearance of the kidneys is within normal limits Electronically Signed   By: Donavan Foil M.D.   On: 01/25/2018 23:22   Dg Chest Port 1 View  Result  Date: 01/26/2018 CLINICAL DATA:  Shortness of breath EXAM: PORTABLE CHEST 1 VIEW COMPARISON:  01/24/2018 FINDINGS: Cardiac shadow is stable. Aortic calcifications are again seen. Increasing vascular congestion and interstitial edema is noted consistent with worsening CHF. No focal confluent infiltrate is seen. No bony abnormality is noted. IMPRESSION: Worsening CHF. Electronically Signed   By: Inez Catalina M.D.   On: 01/26/2018 07:39    Cardiac Studies   none  Patient Profile     84 y.o. male admitted with AMI, and s/p PCI complicated by VT, PAF, and drop in Hgb. He feels well despite a drop in hgb.   Assessment & Plan    1. VT - he has had none. He is off of IV lidocaine. 2. PAF - his atrial fib/flutter has recurred. I had planned to stop the IV amio and switch to oral amio but with atrial arrhythmias, will keep the amio going IV for now. 3. Anemia - he has had a large drop in h/h. No obvious source though with 2 interventional procedures, worrisome. Patient was bathing in a chair and I did not examine his groin. As the patient is completely stable, we will recheck in the morning.  4. AMI - he denies any anginal symptoms. Will follow.   For questions or updates, please contact [place] Please consult [email or web address] for contact info under Cardiology/STEMI.      Signed, Cristopher Peru, MD  01/27/2018, 4:35 PM  Patient ID: Daryl Rodriguez, male   DOB: 18-Mar-1934, 84 y.o.   MRN: [id number]

## 2018-01-27 NOTE — Progress Notes (Signed)
CARDIAC REHAB PHASE I   PRE:  Rate/Rhythm: 104 afib    BP: sitting 139/85    SaO2: 96 RA  MODE:  Ambulation: 290 ft   POST:  Rate/Rhythm: 114 afib    BP: sitting 146/92     SaO2: 97 RA  Pt eager to walk. Stood independently and used RW. Slow, steady pace. Rate remained 100-110s. Seemed fatigued toward end of walk but no major c/o. Return to recliner. Encouraged walking with nursing over w/e.  [place], ACSM 01/27/2018 12:56 PM

## 2018-01-28 LAB — CBC
HCT: 25.3 % — ABNORMAL LOW (ref 39.0–52.0)
Hemoglobin: 8.3 g/dL — ABNORMAL LOW (ref 13.0–17.0)
MCH: 31.4 pg (ref 26.0–34.0)
MCHC: 32.8 g/dL (ref 30.0–36.0)
MCV: 95.8 fL (ref 78.0–100.0)
PLATELETS: 288 10*3/uL (ref 150–400)
RBC: 2.64 MIL/uL — ABNORMAL LOW (ref 4.22–5.81)
RDW: 14.5 % (ref 11.5–15.5)
WBC: 10.4 10*3/uL (ref 4.0–10.5)

## 2018-01-28 LAB — URINALYSIS, ROUTINE W REFLEX MICROSCOPIC
Bilirubin Urine: NEGATIVE
Glucose, UA: 150 mg/dL — AB
Ketones, ur: NEGATIVE mg/dL
LEUKOCYTES UA: NEGATIVE
NITRITE: NEGATIVE
PROTEIN: NEGATIVE mg/dL
SPECIFIC GRAVITY, URINE: 1.023 (ref 1.005–1.030)
pH: 5 (ref 5.0–8.0)

## 2018-01-28 LAB — RENAL FUNCTION PANEL
ALBUMIN: 2.4 g/dL — AB (ref 3.5–5.0)
Anion gap: 8 (ref 5–15)
BUN: 26 mg/dL — ABNORMAL HIGH (ref 6–20)
CHLORIDE: 103 mmol/L (ref 101–111)
CO2: 25 mmol/L (ref 22–32)
CREATININE: 1.19 mg/dL (ref 0.61–1.24)
Calcium: 8.4 mg/dL — ABNORMAL LOW (ref 8.9–10.3)
GFR calc Af Amer: >60 mL/min (ref >60)
GFR, EST NON AFRICAN AMERICAN: 55 mL/min — AB (ref >60)
GLUCOSE: 110 mg/dL — AB (ref 65–99)
Phosphorus: 2.8 mg/dL (ref 2.5–4.6)
Potassium: 3.7 mmol/L (ref 3.5–5.1)
SODIUM: 136 mmol/L (ref 135–145)

## 2018-01-28 LAB — GLUCOSE, CAPILLARY
GLUCOSE-CAPILLARY: 226 mg/dL — AB (ref 65–99)
GLUCOSE-CAPILLARY: 277 mg/dL — AB (ref 65–99)
Glucose-Capillary: 212 mg/dL — ABNORMAL HIGH (ref 65–99)
Glucose-Capillary: 171 mg/dL — ABNORMAL HIGH (ref 65–99)

## 2018-01-28 MED ORDER — AMIODARONE HCL 200 MG PO TABS
400.0000 mg | ORAL_TABLET | Freq: Two times a day (BID) | ORAL | Status: DC
  Administered 2018-01-28 (×2): 400 mg via ORAL
  Filled 2018-01-28 (×3): qty 2

## 2018-01-28 MED ORDER — INSULIN ASPART 100 UNIT/ML ~~LOC~~ SOLN
0.0000 [IU] | Freq: Every day | SUBCUTANEOUS | Status: DC
  Administered 2018-01-28 – 2018-01-30 (×2): 2 [IU] via SUBCUTANEOUS

## 2018-01-28 MED ORDER — TERAZOSIN HCL 1 MG PO CAPS
1.0000 mg | ORAL_CAPSULE | Freq: Every day | ORAL | Status: DC
  Administered 2018-01-28 – 2018-02-01 (×4): 1 mg via ORAL
  Filled 2018-01-28 (×6): qty 1

## 2018-01-28 MED ORDER — INSULIN GLARGINE 100 UNIT/ML ~~LOC~~ SOLN
10.0000 [IU] | Freq: Every day | SUBCUTANEOUS | Status: DC
  Administered 2018-01-28 – 2018-01-31 (×4): 10 [IU] via SUBCUTANEOUS
  Filled 2018-01-28 (×7): qty 0.1

## 2018-01-28 MED ORDER — POLYSACCHARIDE IRON COMPLEX 150 MG PO CAPS
150.0000 mg | ORAL_CAPSULE | Freq: Every day | ORAL | Status: DC
  Administered 2018-01-28 – 2018-02-01 (×4): 150 mg via ORAL
  Filled 2018-01-28 (×7): qty 1

## 2018-01-28 MED ORDER — SENNOSIDES-DOCUSATE SODIUM 8.6-50 MG PO TABS: 2.0000 | ORAL_TABLET | Freq: Every evening | ORAL | Status: DC | PRN

## 2018-01-28 MED ORDER — INSULIN ASPART 100 UNIT/ML ~~LOC~~ SOLN
0.0000 [IU] | Freq: Three times a day (TID) | SUBCUTANEOUS | Status: DC
  Administered 2018-01-28: 3 [IU] via SUBCUTANEOUS
  Administered 2018-01-29 (×2): 2 [IU] via SUBCUTANEOUS
  Administered 2018-01-30 – 2018-01-31 (×2): 1 [IU] via SUBCUTANEOUS
  Administered 2018-01-31: 2 [IU] via SUBCUTANEOUS
  Administered 2018-02-01: 1 [IU] via SUBCUTANEOUS

## 2018-01-28 MED ORDER — METFORMIN HCL ER 500 MG PO TB24: 500.0000 mg | ORAL_TABLET | Freq: Every day | ORAL | Status: DC

## 2018-01-28 NOTE — Progress Notes (Signed)
Daryl Rodriguez called regarding the Life Vest order he received from Dr.Hilty. Daryl Rodriguez was asking for the patients face sheet with insurance information, H&P, all physician progress notes, and results of his echo, cath, and any other tests or procedures be faxed to [phone].  RN called the case manager on today, Claudie Leach to help and she said she would take care of it.

## 2018-01-28 NOTE — Progress Notes (Signed)
Patient noted with diminished urine output throughout the night post foley removal. Patient with frequent use of urinal but only 30-50 mL out with each use. Patient bladder scanned this morning and noted with 390mL. Patient request to get up to restroom. Patient reports voiding an unmeasured amount in toilet at same time of having bowel movement. Patient assisted to recliner after using restroom. Patient laid back and post void bladder scan completed. Patient noted with 201 mL post void. On call MD paged but awaiting response at this time. Patient noted to take Terazosin as part of home meds that is not currently ordered. Day shift primary RN provided nursing report and updated. Day shift RN to follow up with primary team this morning during AM rounds.

## 2018-01-28 NOTE — Progress Notes (Signed)
Called Jim tyrrell with the Life vest company to ask what time he would be coming per families request. Left a message at the number he provided [phone].

## 2018-01-28 NOTE — Progress Notes (Signed)
DAILY PROGRESS NOTE   Patient Name: Daryl Rodriguez Date of Encounter: 01/28/2018  Chief Complaint   No complaints overnight  Patient Profile   84 y.o. male with a history of hypertension, diabetes and hyperlipidemia but no prior cardiac history who had inferior STEMI 2 days ago treated with PCI and drug-eluting stenting via the right radial approach by Dr. Claiborne Billings with an excellent result.  The lesion was thrombotic.  He had moderate disease in his LAD and circumflex.  His troponin only increased to 2.75.  His EKG shows residual slight ST segment elevation in inferior leads.  He did go into A. fib with RVR which has since converted back to sinus rhythm this morning.  He has been placed on IV heparin although was having significant HEENT from his NG tube which has since been discontinued.Marland Kitchen  He still requiring 3 L of oxygen with sats in the low 90 range although he is much more comfortable and breathing better.Marland Kitchen  He is also had some abdominal distention as well with a large gastric air bubble on abdominal flatplate.  NG tube was placed which successfully decompressed him.  His serum creatinine has increased from 1.2 up to 1.6---> 1.9  with IV diuresis.  Diuretics were held, renal consult was obtained.  Serum creatinine has now reversed and is declining in the 1.4 range.  Because of the ventricular tachycardia which is fairly incessant yesterday he underwent cardiac catheterization this morning revealing unchanged anatomy.  He did convert to sinus rhythm.  He had no further arrhythmias since been put being placed on amiodarone and IV lidocaine.  He also had heme from his NG tube.  His IV heparin was discontinued.  Subjective   Converted to sinus yesterday - noted to have a 10-15 beat run of NSVT vs artifact (was brushing his teeth at the time) -reviewed with Dr. Lovena Le, suspect it is artifact. Still on IV amiodarone. Labs stable overnight - remains anemic with H/H of 8/25. Creatinine normalized. Having  difficulty urinating after removal of foley yesterday with increased PVR.  Objective   Vitals:   01/28/18 0500 01/28/18 0600 01/28/18 0700 01/28/18 0754  BP: (!) 133/59 (!) 160/82 135/65   Pulse: 73 74    Resp: 19 (!) 23 (!) 24   Temp:    98.2 F (36.8 C)  TempSrc:    Oral  SpO2: 95% 97% 96%   Weight:      Height:        Intake/Output Summary (Last 24 hours) at 01/28/2018 0817 Last data filed at 01/28/2018 0710 Gross per 24 hour  Intake 1288.18 ml  Output 456 ml  Net 832.18 ml   Filed Weights   01/22/18 1845 01/25/18 0500 01/27/18 0600  Weight: 226 lb 3.1 oz (102.6 kg) 209 lb 14.1 oz (95.2 kg) 205 lb 14.6 oz (93.4 kg)    Physical Exam   General appearance: alert and no distress Neck: no carotid bruit, no JVD and thyroid not enlarged, symmetric, no tenderness/mass/nodules Lungs: clear to auscultation bilaterally Heart: irregularly irregular rhythm and tachycardic Abdomen: soft, non-tender; bowel sounds normal; no masses,  no organomegaly Extremities: extremities normal, atraumatic, no cyanosis or edema Pulses: 2+ and symmetric Skin: Skin color, texture, turgor normal. No rashes or lesions Neurologic: Grossly normal Psych: Pleasant  Inpatient Medications    Scheduled Meds: . aspirin EC  81 mg Oral Daily  . atorvastatin  80 mg Oral q1800  . docusate sodium  100 mg Oral Daily  . glimepiride  4 mg Oral Q breakfast  . isosorbide mononitrate  30 mg Oral Daily  . latanoprost  1 drop Both Eyes QHS  . metoprolol tartrate  50 mg Oral BID  . pantoprazole  40 mg Oral BID  . senna-docusate  2 tablet Oral BID  . terazosin  1 mg Oral Daily  . ticagrelor  90 mg Oral BID    Continuous Infusions: . sodium chloride Stopped (01/23/18 1400)  . amiodarone 30 mg/hr (01/27/18 2315)    PRN Meds: sodium chloride, acetaminophen, alum & mag hydroxide-simeth, loperamide, nitroGLYCERIN, ondansetron (ZOFRAN) IV, prochlorperazine, sodium chloride flush, zolpidem   Labs   Results for  orders placed or performed during the hospital encounter of 01/22/18 (from the past 48 hour(s))  CBC     Status: Abnormal   Collection Time: 01/27/18  2:59 AM  Result Value Ref Range   WBC 10.2 4.0 - 10.5 K/uL   RBC 2.59 (L) 4.22 - 5.81 MIL/uL   Hemoglobin 8.1 (L) 13.0 - 17.0 g/dL   HCT 24.9 (L) 39.0 - 52.0 %   MCV 96.1 78.0 - 100.0 fL   MCH 31.3 26.0 - 34.0 pg   MCHC 32.5 30.0 - 36.0 g/dL   RDW 13.9 11.5 - 15.5 %   Platelets 260 150 - 400 K/uL    Comment: Performed at [facility], [place] [address], [place], [place] [address]  Renal function panel     Status: Abnormal   Collection Time: 01/27/18  2:59 AM  Result Value Ref Range   Sodium 138 135 - 145 mmol/L   Potassium 3.4 (L) 3.5 - 5.1 mmol/L   Chloride 104 101 - 111 mmol/L   CO2 26 22 - 32 mmol/L   Glucose, Bld 71 65 - 99 mg/dL   BUN 37 (H) 6 - 20 mg/dL   Creatinine, Ser 1.24 0.61 - 1.24 mg/dL   Calcium 8.6 (L) 8.9 - 10.3 mg/dL   Phosphorus 2.8 2.5 - 4.6 mg/dL   Albumin 2.4 (L) 3.5 - 5.0 g/dL   GFR calc non Af Amer 53 (L) >60 mL/min   GFR calc Af Amer >60 >60 mL/min    Comment: (NOTE) The eGFR has been calculated using the CKD EPI equation. This calculation has not been validated in all clinical situations. eGFR's persistently <60 mL/min signify possible Chronic Kidney Disease.    Anion gap 8 5 - 15    Comment: Performed at [facility] [address], [place], [place] [address]  Magnesium     Status: None   Collection Time: 01/27/18  2:59 AM  Result Value Ref Range   Magnesium 1.9 1.7 - 2.4 mg/dL    Comment: Performed at [facility] [address], [place], [place] [address]  CBC     Status: Abnormal   Collection Time: 01/28/18  4:26 AM  Result Value Ref Range   WBC 10.4 4.0 - 10.5 K/uL   RBC 2.64 (L) 4.22 - 5.81 MIL/uL   Hemoglobin 8.3 (L) 13.0 - 17.0 g/dL   HCT 25.3 (L) 39.0 - 52.0 %   MCV 95.8 78.0 - 100.0 fL   MCH 31.4 26.0 - 34.0 pg   MCHC 32.8 30.0 - 36.0 g/dL   RDW 14.5 11.5 - 15.5 %     Platelets 288 150 - 400 K/uL    Comment: Performed at [facility], [place] [address], [place], [place] [address]  Renal function panel     Status: Abnormal  Collection Time: 01/28/18  4:26 AM  Result Value Ref Range   Sodium 136 135 - 145 mmol/L   Potassium 3.7 3.5 - 5.1 mmol/L   Chloride 103 101 - 111 mmol/L   CO2 25 22 - 32 mmol/L   Glucose, Bld 110 (H) 65 - 99 mg/dL   BUN 26 (H) 6 - 20 mg/dL   Creatinine, Ser 1.19 0.61 - 1.24 mg/dL   Calcium 8.4 (L) 8.9 - 10.3 mg/dL   Phosphorus 2.8 2.5 - 4.6 mg/dL   Albumin 2.4 (L) 3.5 - 5.0 g/dL   GFR calc non Af Amer 55 (L) >60 mL/min   GFR calc Af Amer >60 >60 mL/min    Comment: (NOTE) The eGFR has been calculated using the CKD EPI equation. This calculation has not been validated in all clinical situations. eGFR's persistently <60 mL/min signify possible Chronic Kidney Disease.    Anion gap 8 5 - 15    Comment: Performed at [facility] [address] [place], [place] [address]    ECG   N/A  Telemetry   Sinus with short run of NSVT overnight - Personally Reviewed  Radiology    No results found.  Cardiac Studies   N/A  Assessment   Active Problems:   ST elevation myocardial infarction involving right coronary artery (HCC)   STEMI involving right coronary artery (HCC)   Acute ST elevation myocardial infarction (STEMI) of inferior wall (HCC)   Respiratory distress   Acute pulmonary edema (HCC)   Acute respiratory failure (HCC)   V-tach (HCC)   Hypokalemia   Hypomagnesemia   Plan   1. Artifact on tele overnight. Magnesium and potassium repleted yesterday. He has had a hemoglobin drop - looks like it was 10 a few days ago, then up to 15, now 8.5 - but stable. No s/s of bleeding. Will switch from IV to po amiodarone today. Will contact Claiborne Billings for placement of LifeVest. Restart meds for BPH - urinary retention. Monitor overnight on telemetry floor - but ok to transfer out of ICU. On DAPT - will hold off on  anticoagulation for now due to hemoglobin drop. Start oral iron.  Time Spent Directly with Patient:  I have spent a total of 25 minutes with the patient reviewing hospital notes, telemetry, EKGs, labs and examining the patient as well as establishing an assessment and plan that was discussed personally with the patient. > 50% of time was spent in direct patient care.  Length of Stay:  LOS: 6 days   Pixie Casino, MD, [facility], [place] Director of the Advanced Lipid Disorders &  Cardiovascular Risk Reduction Clinic Diplomate of the American Board of Clinical Lipidology Attending Cardiologist  Direct Dial: [phone]  Fax: [phone]  Website:  [email or web address] [email or web address] 01/28/2018, 8:17 AM

## 2018-01-28 NOTE — Care Management (Signed)
All requested information faxed to [facility] for [facility].

## 2018-01-28 NOTE — Progress Notes (Signed)
D/w nursing staff about issues with in and out cath [facility] urology and spoke with Dr. Tresa Moore - he has agreed to see the patient later today. Recommended if post-void residual <300 to avoid straight cath. Will address elevated CBG's.  Dr. Wynonia Lawman is on call for the remainder of the day.  Pixie Casino, MD, [facility], [place] Director of the Advanced Lipid Disorders &  Cardiovascular Risk Reduction Clinic Diplomate of the American Board of Clinical Lipidology Attending Cardiologist  Direct Dial: [phone]  Fax: [phone]  Website:  [email or web address]

## 2018-01-28 NOTE — Consult Note (Signed)
Reason for Consult: Lower Urinary Tract Symptoms / Incomplete Bladder Emptying, Phimosis, Acute Renal Failure  Referring Physician: Lyman Bishop MD  Daryl Rodriguez is an 84 y.o. male.   HPI:   1 - Lower Urinary Tract Symptoms / Incomplete Bladder Emptying - on terazosin at baseline with good control of modest obstructive symptoms.  Some transient increase in obstructive symptoms while holding alpha blockers around time of STEMI / MI / Heart Cath 01/2018. PVR 247m. DRE 40gm smooth. Bother resolved with restarting meds.   2 -  Phimosis - pt UNcircumcised with moderate phimosis, unable to retract foreskin making penile hygiene difficult, No ballooning with irritation, minimal bother.   3 -  Acute Renal Failure - Cr rise to 1.8's during admission for STEMI / necessary procedural contrast. Resolution towards baseline of 1.2. Renal [place] hydro.   PMH sig for CAD, DM2, HLD, HTN. He is retired cPhysiological scientist  Today "Daryl Rodriguez is seen in consultation for LUTS, phimosis, ARF, referred by Dr. HDebara Pickett   Past Medical History:  Diagnosis Date  . DM II (diabetes mellitus, type II), controlled ([facility]   . Hyperlipidemia   . Hypertension     Past Surgical History:  Procedure Laterality Date  . BACK SURGERY    . CORONARY STENT INTERVENTION N/A 01/22/2018   Procedure: CORONARY STENT INTERVENTION;  Surgeon: KTroy Sine MD;  [facility];  Service: Cardiovascular;  Laterality: N/A;  . CORONARY/GRAFT ACUTE MI REVASCULARIZATION N/A 01/22/2018   Procedure: Coronary/Graft Acute MI Revascularization;  Surgeon: KTroy Sine MD;  Location: [facility];  Service: Cardiovascular;  Laterality: N/A;  . LEFT HEART CATH AND CORONARY ANGIOGRAPHY N/A 01/22/2018   Procedure: LEFT HEART CATH AND CORONARY ANGIOGRAPHY;  Surgeon: KTroy Sine MD;  [facility];  Service: Cardiovascular;  Laterality: N/A;  . LEFT HEART CATH AND CORONARY ANGIOGRAPHY N/A 01/25/2018   Procedure: LEFT HEART CATH AND CORONARY ANGIOGRAPHY - RELOOK;  Surgeon: AWellington Hampshire MD;  [facility];  Service: Cardiovascular;  Laterality: N/A;  . WRIST SURGERY      Family History  Problem Relation Age of Onset  . Heart attack Father 838   Social History:  reports that he has quit smoking. He has never used smokeless tobacco. He reports that he drank alcohol. He reports that he has current or past drug history.  Allergies: No Known Allergies  Medications: I have reviewed the patient's current medications.  Results for orders placed or performed during the hospital encounter of 01/22/18 (from the past 48 hour(s))  CBC     Status: Abnormal   Collection Time: 01/27/18  2:59 AM  Result Value Ref Range   WBC 10.2 4.0 - 10.5 K/uL   RBC 2.59 (L) 4.22 - 5.81 MIL/uL   Hemoglobin 8.1 (L) 13.0 - 17.0 g/dL   HCT 24.9 (L) 39.0 - 52.0 %   MCV 96.1 78.0 - 100.0 fL   MCH 31.3 26.0 - 34.0 pg   MCHC 32.5 30.0 - 36.0 g/dL   RDW 13.9 11.5 - 15.5 %   Platelets 260 150 - 400 K/uL    Comment: Performed at [facility] [address], [place] [address] Renal function panel     Status: Abnormal   Collection Time: 01/27/18  2:59 AM  Result Value Ref Range   Sodium 138 135 - 145 mmol/L   Potassium 3.4 (L) 3.5 - 5.1 mmol/L  Chloride 104 101 - 111 mmol/L   CO2 26 22 - 32 mmol/L   Glucose, Bld 71 65 - 99 mg/dL   BUN 37 (H) 6 - 20 mg/dL   Creatinine, Ser 1.24 0.61 - 1.24 mg/dL   Calcium 8.6 (L) 8.9 - 10.3 mg/dL   Phosphorus 2.8 2.5 - 4.6 mg/dL   Albumin 2.4 (L) 3.5 - 5.0 g/dL   GFR calc non Af Amer 53 (L) >60 mL/min   GFR calc Af Amer >60 >60 mL/min    Comment: (NOTE) The eGFR has been calculated using the CKD EPI equation. This calculation has not been validated in all clinical situations. eGFR's persistently <60 mL/min signify possible Chronic Kidney Disease.    Anion gap 8 5 - 15    Comment: Performed at [facility] [address]  [place], [place] [address]  Magnesium     Status: None   Collection Time: 01/27/18  2:59 AM  Result Value Ref Range   Magnesium 1.9 1.7 - 2.4 mg/dL    Comment: Performed at [facility] [address] [place], [place] [address]  CBC     Status: Abnormal   Collection Time: 01/28/18  4:26 AM  Result Value Ref Range   WBC 10.4 4.0 - 10.5 K/uL   RBC 2.64 (L) 4.22 - 5.81 MIL/uL   Hemoglobin 8.3 (L) 13.0 - 17.0 g/dL   HCT 25.3 (L) 39.0 - 52.0 %   MCV 95.8 78.0 - 100.0 fL   MCH 31.4 26.0 - 34.0 pg   MCHC 32.8 30.0 - 36.0 g/dL   RDW 14.5 11.5 - 15.5 %   Platelets 288 150 - 400 K/uL    Comment: Performed at [facility], [place] [address] [place], [place] [address]  Renal function panel     Status: Abnormal   Collection Time: 01/28/18  4:26 AM  Result Value Ref Range   Sodium 136 135 - 145 mmol/L   Potassium 3.7 3.5 - 5.1 mmol/L   Chloride 103 101 - 111 mmol/L   CO2 25 22 - 32 mmol/L   Glucose, Bld 110 (H) 65 - 99 mg/dL   BUN 26 (H) 6 - 20 mg/dL   Creatinine, Ser 1.19 0.61 - 1.24 mg/dL   Calcium 8.4 (L) 8.9 - 10.3 mg/dL   Phosphorus 2.8 2.5 - 4.6 mg/dL   Albumin 2.4 (L) 3.5 - 5.0 g/dL   GFR calc non Af Amer 55 (L) >60 mL/min   GFR calc Af Amer >60 >60 mL/min    Comment: (NOTE) The eGFR has been calculated using the CKD EPI equation. This calculation has not been validated in all clinical situations. eGFR's persistently <60 mL/min signify possible Chronic Kidney Disease.    Anion gap 8 5 - 15    Comment: Performed at [facility] [address], [place], [place] [address]  Glucose, capillary     Status: Abnormal   Collection Time: 01/28/18  9:26 AM  Result Value Ref Range   Glucose-Capillary 226 (H) 65 - 99 mg/dL  Glucose, capillary     Status: Abnormal   Collection Time: 01/28/18 11:12 AM  Result Value Ref Range   Glucose-Capillary 277 (H) 65 - 99 mg/dL   Comment 1 Notify RN   Urinalysis, Routine w reflex microscopic     Status: Abnormal   Collection Time:  01/28/18  1:04 PM  Result Value Ref Range   Color, Urine YELLOW YELLOW   APPearance CLEAR  CLEAR   Specific Gravity, Urine 1.023 1.005 - 1.030   pH 5.0 5.0 - 8.0   Glucose, UA 150 (A) NEGATIVE mg/dL   Hgb urine dipstick SMALL (A) NEGATIVE   Bilirubin Urine NEGATIVE NEGATIVE   Ketones, ur NEGATIVE NEGATIVE mg/dL   Protein, ur NEGATIVE NEGATIVE mg/dL   Nitrite NEGATIVE NEGATIVE   Leukocytes, UA NEGATIVE NEGATIVE   WBC, UA 0-5 0 - 5 WBC/hpf   Bacteria, UA RARE (A) NONE SEEN    Comment: Performed at [facility] [address] [place], [place] [address]    No results found.  Review of Systems  Constitutional: Negative.  Negative for chills and fever.  HENT: Negative.   Respiratory: Negative.   Cardiovascular: Negative.   Gastrointestinal: Negative.   Genitourinary: Positive for frequency and urgency. Negative for dysuria, flank pain and hematuria.  Musculoskeletal: Negative.   Skin: Negative.   Neurological: Negative.   Endo/Heme/Allergies: Negative.   Psychiatric/Behavioral: Negative.    Blood pressure (!) 123/59, pulse 65, temperature 97.8 F (36.6 C), temperature source Oral, resp. rate 18, height 5' 9"  (1.753 m), weight 93.4 kg (205 lb 14.6 oz), SpO2 97 %. Physical Exam  Constitutional: He appears well-developed.  Very friendly and mentally astute. Family at bedside.   HENT:  Head: Normocephalic.  Eyes: Pupils are equal, round, and reactive to light.  Neck: Normal range of motion.  Cardiovascular: Normal rate.  Respiratory: Effort normal.  GI: Soft.  Genitourinary:  Genitourinary Comments: 40gm prostate w/o nodules.   Musculoskeletal: Normal range of motion.  Neurological: He is alert.  Skin: Skin is warm.  Psychiatric: He has a normal mood and affect.    Assessment/Plan:   1 - Lower Urinary Tract Symptoms / Incomplete Bladder Emptying - his GU function is age-appropriate. He empties acceptably, though not perfectly. Baseline GFR excellent and no  clinical bother.  Agree with continue current alpha blockers at his prostate size (treatment of choice <63m prostate vol) and dosage, consider increase should bother increase.  2 -  Phimosis - we discussed options of observation v. Topicals v surgery. He has no baseline bother an elects observation, I agree.   3 -  Acute Renal Failure - resolved, likely due to transient contrast nephropathy by time course.   At this point pt is not in any acute danger from GU perspective and essentially back to baseline. No further eval warranted. Please call me directly with questions anytime.   Serrena Linderman 01/28/2018, 4:52 PM

## 2018-01-28 NOTE — Plan of Care (Signed)
  Problem: Health Behavior/Discharge Planning: Goal: Ability to manage health-related needs will improve Outcome: Progressing   Problem: Clinical Measurements: Goal: Ability to maintain clinical measurements within normal limits will improve Outcome: Progressing Goal: Will remain free from infection Outcome: Progressing Goal: Diagnostic test results will improve Outcome: Progressing Goal: Respiratory complications will improve Outcome: Progressing Goal: Cardiovascular complication will be avoided Outcome: Progressing   Problem: Activity: Goal: Risk for activity intolerance will decrease Outcome: Progressing   Problem: Nutrition: Goal: Adequate nutrition will be maintained Outcome: Progressing   Problem: Coping: Goal: Level of anxiety will decrease Outcome: Progressing   Problem: Elimination: Goal: Will not experience complications related to bowel motility Outcome: Progressing Goal: Will not experience complications related to urinary retention Outcome: Progressing   Problem: Pain Managment: Goal: General experience of comfort will improve Outcome: Progressing   Problem: Skin Integrity: Goal: Risk for impaired skin integrity will decrease Outcome: Progressing   Problem: Education: Goal: Understanding of cardiac disease, CV risk reduction, and recovery process will improve Outcome: Progressing Goal: Understanding of medication regimen will improve Outcome: Progressing   Problem: Cardiac: Goal: Ability to achieve and maintain adequate cardiopulmonary perfusion will improve Outcome: Progressing Goal: Vascular access site(s) Level 0-1 will be maintained Outcome: Progressing   Problem: Health Behavior/Discharge Planning: Goal: Ability to safely manage health-related needs after discharge will improve Outcome: Progressing

## 2018-01-28 NOTE — Progress Notes (Signed)
Transfer patient from [facility]. Set up home CPAP equipment per request. Distilled H20 added to chamber for humidity.

## 2018-01-28 NOTE — Progress Notes (Signed)
In and out cath performed, urinalysis and culture obtained and sent to lab. Patients' penis is uncircumcised and was unable to retract head of penis to see meatus, however, was successful by holding onto skin and pulling open instead. Patient tolerated very well. Obtained 350 ml's of yellow urine. Urine did not have any odor but sediment was present.

## 2018-01-28 NOTE — Progress Notes (Signed)
Spoke with Dr. Debara Pickett via phone regarding CBG of 277 and about the difficulty with in and out cath. Dr. Debara Pickett to notify urology and start patient on something for the high blood glucose results.

## 2018-01-29 LAB — CBC
HCT: 25.2 % — ABNORMAL LOW (ref 39.0–52.0)
HEMOGLOBIN: 8 g/dL — AB (ref 13.0–17.0)
MCH: 30.8 pg (ref 26.0–34.0)
MCHC: 31.7 g/dL (ref 30.0–36.0)
MCV: 96.9 fL (ref 78.0–100.0)
PLATELETS: 286 10*3/uL (ref 150–400)
RBC: 2.6 MIL/uL — AB (ref 4.22–5.81)
RDW: 14.8 % (ref 11.5–15.5)
WBC: 9.5 10*3/uL (ref 4.0–10.5)

## 2018-01-29 LAB — RENAL FUNCTION PANEL
ALBUMIN: 2.4 g/dL — AB (ref 3.5–5.0)
Anion gap: 7 (ref 5–15)
BUN: 22 mg/dL — ABNORMAL HIGH (ref 6–20)
CALCIUM: 8.3 mg/dL — AB (ref 8.9–10.3)
CO2: 23 mmol/L (ref 22–32)
CREATININE: 1.15 mg/dL (ref 0.61–1.24)
Chloride: 106 mmol/L (ref 101–111)
GFR calc non Af Amer: 58 mL/min — ABNORMAL LOW (ref >60)
GLUCOSE: 108 mg/dL — AB (ref 65–99)
PHOSPHORUS: 2.7 mg/dL (ref 2.5–4.6)
Potassium: 3.8 mmol/L (ref 3.5–5.1)
SODIUM: 136 mmol/L (ref 135–145)

## 2018-01-29 LAB — GLUCOSE, CAPILLARY
GLUCOSE-CAPILLARY: 100 mg/dL — AB (ref 65–99)
GLUCOSE-CAPILLARY: 202 mg/dL — AB (ref 65–99)
Glucose-Capillary: 194 mg/dL — ABNORMAL HIGH (ref 65–99)
Glucose-Capillary: 190 mg/dL — ABNORMAL HIGH (ref 65–99)

## 2018-01-29 MED ORDER — AMIODARONE HCL IN DEXTROSE 360-4.14 MG/200ML-% IV SOLN
60.0000 mg/h | INTRAVENOUS | Status: AC
  Administered 2018-01-29 (×2): 60 mg/h via INTRAVENOUS
  Filled 2018-01-29: qty 400

## 2018-01-29 MED ORDER — ISOSORBIDE MONONITRATE ER 30 MG PO TB24
30.0000 mg | ORAL_TABLET | Freq: Every day | ORAL | Status: DC
  Administered 2018-01-30 – 2018-01-31 (×2): 30 mg via ORAL
  Filled 2018-01-29 (×2): qty 1

## 2018-01-29 MED ORDER — PANTOPRAZOLE SODIUM 40 MG PO TBEC
40.0000 mg | DELAYED_RELEASE_TABLET | Freq: Two times a day (BID) | ORAL | Status: DC
  Administered 2018-01-30 – 2018-02-01 (×5): 40 mg via ORAL
  Filled 2018-01-29 (×5): qty 1

## 2018-01-29 MED ORDER — ATORVASTATIN CALCIUM 80 MG PO TABS
80.0000 mg | ORAL_TABLET | Freq: Every day | ORAL | Status: DC
  Administered 2018-01-30 – 2018-01-31 (×2): 80 mg via ORAL
  Filled 2018-01-29 (×2): qty 1

## 2018-01-29 MED ORDER — AMIODARONE HCL IN DEXTROSE 360-4.14 MG/200ML-% IV SOLN
INTRAVENOUS | Status: AC
Start: 2018-01-29 — End: 2018-01-29
  Administered 2018-01-29: 30 mg/h via INTRAVENOUS
  Filled 2018-01-29: qty 200

## 2018-01-29 MED ORDER — AMIODARONE HCL IN DEXTROSE 360-4.14 MG/200ML-% IV SOLN
30.0000 mg/h | INTRAVENOUS | Status: DC
  Administered 2018-01-29 – 2018-01-31 (×4): 30 mg/h via INTRAVENOUS
  Filled 2018-01-29 (×3): qty 200

## 2018-01-29 MED ORDER — AMIODARONE LOAD VIA INFUSION
150.0000 mg | Freq: Once | INTRAVENOUS | Status: AC
  Administered 2018-01-29: 150 mg via INTRAVENOUS
  Filled 2018-01-29: qty 83.34

## 2018-01-29 NOTE — Progress Notes (Signed)
Per pt's family: "a screw fell out of his eyeglasses, but they are not broken". The family has taken the eyeglasses with them to get the screw replaced. The patient is aware of the plan and agreeable.

## 2018-01-29 NOTE — Progress Notes (Signed)
CCMD called RN to go to pt's room. Upon arrival pt in chair with agonal breathing and pt will look at RN but not answer questions. Rapid response and code called pt lowered to floor. CPR started and pads placed. Pt shocked with 200 joules at 0923 and back to NSR. VSS, pt is able to answer questions, and is awake. Pt transferred to ICU and charge nurse with family. Bedside report given to oncoming nurse.

## 2018-01-29 NOTE — Progress Notes (Signed)
PT Cancellation Note  Patient Details Name: Daryl Rodriguez MRN: [id number] DOB: 1934/07/31   Cancelled Treatment:    Reason Eval/Treat Not Completed: Medical issues which prohibited therapy Pt transferred to ICU as code blue was called. Will hold until pt medically appropriate and follow up as schedule allows.   Leighton Ruff, PT, DPT  Acute Rehabilitation Services  Pager: [phone]   Rudean Hitt 01/29/2018, 9:51 AM

## 2018-01-29 NOTE — Progress Notes (Signed)
Pt family called nurses station reported she needed a nurse at bedside. Charge nurse went to bedside. Pt was agonal breathing sitting in chair unresponsive to voice or pain. Called primary nurse to bedside and called code. Pt was moved from the chair to the floor.    This RN stepped out and talked with family and chaplin to explain the events opccuring. Family understanding.  Pt transferred to [facility]. Pt has all belongings including CPAP and family settled in [facility] waiting room.

## 2018-01-29 NOTE — Progress Notes (Addendum)
Patient ID: Daryl Rodriguez, male   DOB: 1934-04-21, 84 y.o.   MRN: [id number]  Critical care Note  Called to code with the patient undergoing CPR. He was reassessed and had a very weak pulse and was minimally responsive. Electrodispersive pads placed on the chest and he was noted to be in fast VT at over 200/min. He was immediately defibrillated with 200 joules of biphasic energy, restoring NSR. He was transferred to the ICU. IV amiodarone will be initiated. His exam post defib demonstrates an elderly appearing man who is awake, answers questions appropriately, with clear lungs, normal cardiac exam, and warm extremities. Tele reveals NSR.  We will continue IV amio today, plan ICD tomorrow and continue his medical therapy.  I spent 32 minutes including over 50% face to face time treating this patient.   [name].D.

## 2018-01-29 NOTE — Progress Notes (Signed)
Per pt and his family: his emergency contact is his daughter Asencion Partridge) because his wife is currently in a rehab facility recovering from hip surgery.   Daryl Rodriguez [phone]

## 2018-01-29 NOTE — Progress Notes (Signed)
   01/29/18 0900  Clinical Encounter Type  Visited With Family  Visit Type Code  Consult/Referral To Chaplain  Spiritual Encounters  Spiritual Needs Prayer;Emotional  Stress Factors  Family Stress Factors Major life changes  Chaplain visited with the family as a code blue was called.  The family was met in the hall by the Chaplain and prayer was administered as the nurse was giving the family an update about their loved one.

## 2018-01-29 NOTE — Progress Notes (Signed)
Progress Note  Patient Name: Daryl Rodriguez Date of Encounter: 01/29/2018  Primary Cardiologist: Shelva Majestic, MD   Subjective   "I am ready to go home". Denies chest pain, abdominal pain, or sob.    Inpatient Medications    Scheduled Meds: . amiodarone  400 mg Oral BID  . aspirin EC  81 mg Oral Daily  . atorvastatin  80 mg Oral q1800  . glimepiride  4 mg Oral Q breakfast  . insulin aspart  0-5 Units Subcutaneous QHS  . insulin aspart  0-9 Units Subcutaneous TID WC  . insulin glargine  10 Units Subcutaneous QHS  . iron polysaccharides  150 mg Oral Daily  . isosorbide mononitrate  30 mg Oral Daily  . latanoprost  1 drop Both Eyes QHS  . metoprolol tartrate  50 mg Oral BID  . pantoprazole  40 mg Oral BID  . terazosin  1 mg Oral Daily  . ticagrelor  90 mg Oral BID   Continuous Infusions: . sodium chloride Stopped (01/23/18 1400)   PRN Meds: sodium chloride, acetaminophen, alum & mag hydroxide-simeth, loperamide, nitroGLYCERIN, ondansetron (ZOFRAN) IV, prochlorperazine, senna-docusate, sodium chloride flush, zolpidem   Vital Signs    Vitals:   01/28/18 1930 01/28/18 2350 01/29/18 0407 01/29/18 0754  BP: (!) 145/67 (!) 122/58 132/63 (!) 142/63  Pulse: 83 90 76 78  Resp: 18 20 20 18   Temp: 98.2 F (36.8 C) 99.3 F (37.4 C) 98.6 F (37 C) 99 F (37.2 C)  TempSrc: Oral Oral Oral Oral  SpO2: 98% 98% 98% 98%  Weight:   208 lb 11.2 oz (94.7 kg)   Height:        Intake/Output Summary (Last 24 hours) at 01/29/2018 0913 Last data filed at 01/29/2018 3428 Gross per 24 hour  Intake 381.71 ml  Output 425 ml  Net -43.29 ml   Filed Weights   01/27/18 0600 01/28/18 1710 01/29/18 0407  Weight: 205 lb 14.6 oz (93.4 kg) 209 lb (94.8 kg) 208 lb 11.2 oz (94.7 kg)    Telemetry    nsr - Personally Reviewed  ECG    nsr - Personally Reviewed  Physical Exam   GEN: No acute distress.   Neck: No JVD Cardiac: RRR, no murmurs, rubs, or gallops.  Respiratory: Clear to  auscultation bilaterally. GI: Soft, nontender, non-distended  MS: No edema; No deformity. Neuro:  Nonfocal  Psych: Normal affect   Labs    Chemistry Recent Labs  Lab 01/22/18 1632 01/23/18 0258  01/27/18 0259 01/28/18 0426 01/29/18 0352  NA 135  135 137   < > 138 136 136  K 4.4  4.3 4.1   < > 3.4* 3.7 3.8  CL 101  101 102   < > 104 103 106  CO2 21* 25   < > 26 25 23   GLUCOSE 163*  172* 148*   < > 71 110* 108*  BUN 28*  29* 30*   < > 37* 26* 22*  CREATININE 1.39*  1.20 1.43*   < > 1.24 1.19 1.15  CALCIUM 8.9 8.6*   < > 8.6* 8.4* 8.3*  PROT 6.9 5.9*  --   --   --   --   ALBUMIN 3.0* 2.6*   < > 2.4* 2.4* 2.4*  AST 22 35  --   --   --   --   ALT 24 30  --   --   --   --   ALKPHOS 65 57  --   --   --   --  BILITOT 1.2 1.3*  --   --   --   --   GFRNONAA 46* 44*   < > 53* 55* 58*  GFRAA 53* 51*   < > >60 >60 >60  ANIONGAP 13 10   < > 8 8 7    < > = values in this interval not displayed.     Hematology Recent Labs  Lab 01/27/18 0259 01/28/18 0426 01/29/18 0352  WBC 10.2 10.4 9.5  RBC 2.59* 2.64* 2.60*  HGB 8.1* 8.3* 8.0*  HCT 24.9* 25.3* 25.2*  MCV 96.1 95.8 96.9  MCH 31.3 31.4 30.8  MCHC 32.5 32.8 31.7  RDW 13.9 14.5 14.8  PLT 260 288 286    Cardiac Enzymes Recent Labs  Lab 01/22/18 1632 01/22/18 1934 01/24/18 1927  TROPONINI 1.80* 2.75* 3.19*   No results for input(s): TROPIPOC in the last 168 hours.   BNP Recent Labs  Lab 01/22/18 1934  BNP 695.6*     DDimer No results for input(s): DDIMER in the last 168 hours.   Radiology    No results found.  Cardiac Studies   none  Patient Profile     84 y.o. male admitted with inferior STEMI, s/p PCI, complicated by late VF and PAF, now doing well.  Assessment & Plan    1. Acute inferior MI - he has no anginal symptoms and is s/p relook cath due to recurrent VT. Continue medical therapy. At some point we will need to switch to plavix, likely at either 30 days or 90 days. 2. VT - he has been  placed on amiodarone. He will be set up for a life vest at DC.   For questions or updates, please contact [place] Please consult [email or web address] for contact info under Cardiology/STEMI.      Signed, Cristopher Peru, MD  01/29/2018, 9:13 AM  Patient ID: Daryl Rodriguez, male   DOB: 08-11-1934, 84 y.o.   MRN: [id number]   Addendum: Called to bedside. Patient had collapsed and found to be in hemodynamically unstable VT. He was barely conscious. I defibrillated him with 200 joules of biphasic energy. He will be transferred back to the ICU and will undergo ICD insertion prior to DC.  [name].D.

## 2018-01-29 NOTE — Progress Notes (Signed)
Pt arrived to [place] from [facility], bedside report received from RN, MD at bedside, pt attached to zoll. Amiodarone gtt started per Dr. Lovena Le. Family at bedside and updated on the plan of care. Will continue to monitor pt closely.

## 2018-01-29 NOTE — Progress Notes (Signed)
This RN spoke with the [employer] rep Queen Blossom) in regards to pt no longer needing the vest due to planned ICD placement tomorrow.

## 2018-01-30 ENCOUNTER — Encounter (HOSPITAL_COMMUNITY): Payer: Self-pay | Admitting: Internal Medicine

## 2018-01-30 ENCOUNTER — Encounter (HOSPITAL_COMMUNITY)
Admission: RE | Disposition: A | Discharge status: Home Health | Payer: Self-pay | Source: Ambulatory Visit | Attending: Cardiovascular Disease

## 2018-01-30 DIAGNOSIS — I472 Ventricular tachycardia: Secondary | ICD-10-CM

## 2018-01-30 DIAGNOSIS — I255 Ischemic cardiomyopathy: Secondary | ICD-10-CM

## 2018-01-30 HISTORY — PX: ICD IMPLANT: EP1208

## 2018-01-30 LAB — RENAL FUNCTION PANEL
ANION GAP: 8 (ref 5–15)
Albumin: 2.4 g/dL — ABNORMAL LOW (ref 3.5–5.0)
BUN: 20 mg/dL (ref 6–20)
CO2: 24 mmol/L (ref 22–32)
Calcium: 8.3 mg/dL — ABNORMAL LOW (ref 8.9–10.3)
Chloride: 105 mmol/L (ref 101–111)
Creatinine, Ser: 1.13 mg/dL (ref 0.61–1.24)
GFR calc Af Amer: >60 mL/min (ref >60)
GFR calc non Af Amer: 59 mL/min — ABNORMAL LOW (ref >60)
GLUCOSE: 151 mg/dL — AB (ref 65–99)
POTASSIUM: 3.9 mmol/L (ref 3.5–5.1)
Phosphorus: 2.6 mg/dL (ref 2.5–4.6)
SODIUM: 137 mmol/L (ref 135–145)

## 2018-01-30 LAB — CBC
HEMATOCRIT: 25.8 % — AB (ref 39.0–52.0)
HEMOGLOBIN: 8.2 g/dL — AB (ref 13.0–17.0)
MCH: 30.7 pg (ref 26.0–34.0)
MCHC: 31.8 g/dL (ref 30.0–36.0)
MCV: 96.6 fL (ref 78.0–100.0)
Platelets: 312 10*3/uL (ref 150–400)
RBC: 2.67 MIL/uL — ABNORMAL LOW (ref 4.22–5.81)
RDW: 14.9 % (ref 11.5–15.5)
WBC: 11.9 10*3/uL — ABNORMAL HIGH (ref 4.0–10.5)

## 2018-01-30 LAB — URINE CULTURE: SPECIAL REQUESTS: NORMAL

## 2018-01-30 LAB — GLUCOSE, CAPILLARY
GLUCOSE-CAPILLARY: 97 mg/dL (ref 65–99)
Glucose-Capillary: 186 mg/dL — ABNORMAL HIGH (ref 65–99)
Glucose-Capillary: 122 mg/dL — ABNORMAL HIGH (ref 65–99)
Glucose-Capillary: 145 mg/dL — ABNORMAL HIGH (ref 65–99)
Glucose-Capillary: 235 mg/dL — ABNORMAL HIGH (ref 65–99)

## 2018-01-30 LAB — MAGNESIUM: Magnesium: 2 mg/dL (ref 1.7–2.4)

## 2018-01-30 SURGERY — ICD IMPLANT
Anesthesia: LOCAL

## 2018-01-30 MED ORDER — ACETAMINOPHEN 325 MG PO TABS
325.0000 mg | ORAL_TABLET | ORAL | Status: DC | PRN
Start: 2018-01-30 — End: 2018-02-01

## 2018-01-30 MED ORDER — FENTANYL CITRATE (PF) 100 MCG/2ML IJ SOLN
INTRAMUSCULAR | Status: DC | PRN
  Administered 2018-01-30 (×3): 12.5 ug via INTRAVENOUS
  Administered 2018-01-30: 25 ug via INTRAVENOUS
  Administered 2018-01-30: 12.5 ug via INTRAVENOUS

## 2018-01-30 MED ORDER — HEPARIN (PORCINE) IN NACL 2-0.9 UNITS/ML
INTRAMUSCULAR | Status: DC | PRN
  Administered 2018-01-30: 500 mL

## 2018-01-30 MED ORDER — LIDOCAINE HCL (PF) 1 % IJ SOLN
INTRAMUSCULAR | Status: DC | PRN
  Administered 2018-01-30: 45 mL

## 2018-01-30 MED ORDER — GENTAMICIN SULFATE 40 MG/ML IJ SOLN
INTRAMUSCULAR | Status: AC
  Filled 2018-01-30: qty 2

## 2018-01-30 MED ORDER — MIDAZOLAM HCL 5 MG/5ML IJ SOLN
INTRAMUSCULAR | Status: AC
  Filled 2018-01-30: qty 5

## 2018-01-30 MED ORDER — CEFAZOLIN SODIUM-DEXTROSE 1-4 GM/50ML-% IV SOLN
1.0000 g | Freq: Four times a day (QID) | INTRAVENOUS | Status: AC
  Administered 2018-01-31 (×2): 1 g via INTRAVENOUS
  Filled 2018-01-30 (×4): qty 50

## 2018-01-30 MED ORDER — SODIUM CHLORIDE 0.9 % IV SOLN: 250.0000 mL | INTRAVENOUS | Status: DC

## 2018-01-30 MED ORDER — FENTANYL CITRATE (PF) 100 MCG/2ML IJ SOLN
INTRAMUSCULAR | Status: AC
  Filled 2018-01-30: qty 2

## 2018-01-30 MED ORDER — SODIUM CHLORIDE 0.9% FLUSH
3.0000 mL | Freq: Two times a day (BID) | INTRAVENOUS | Status: DC
  Administered 2018-01-30: 3 mL via INTRAVENOUS

## 2018-01-30 MED ORDER — CHLORHEXIDINE GLUCONATE 4 % EX LIQD
CUTANEOUS | Status: AC
  Administered 2018-01-30: 08:00:00
  Filled 2018-01-30: qty 15

## 2018-01-30 MED ORDER — CHLORHEXIDINE GLUCONATE 4 % EX LIQD: 60.0000 mL | Freq: Once | CUTANEOUS | Status: AC

## 2018-01-30 MED ORDER — CEFAZOLIN SODIUM-DEXTROSE 2-4 GM/100ML-% IV SOLN
INTRAVENOUS | Status: AC
  Filled 2018-01-30: qty 100

## 2018-01-30 MED ORDER — CEFAZOLIN SODIUM-DEXTROSE 2-4 GM/100ML-% IV SOLN
2.0000 g | INTRAVENOUS | Status: AC
  Administered 2018-01-30: 2 g via INTRAVENOUS

## 2018-01-30 MED ORDER — ONDANSETRON HCL 4 MG/2ML IJ SOLN
4.0000 mg | Freq: Four times a day (QID) | INTRAMUSCULAR | Status: DC | PRN
  Administered 2018-01-31: 4 mg via INTRAVENOUS
  Filled 2018-01-30: qty 2

## 2018-01-30 MED ORDER — SODIUM CHLORIDE 0.9 % IV SOLN
80.0000 mg | INTRAVENOUS | Status: AC
  Administered 2018-01-30: 80 mg
  Filled 2018-01-30: qty 2

## 2018-01-30 MED ORDER — SODIUM CHLORIDE 0.9 % IV SOLN: INTRAVENOUS | Status: DC

## 2018-01-30 MED ORDER — SODIUM CHLORIDE 0.9% FLUSH: 3.0000 mL | INTRAVENOUS | Status: DC | PRN

## 2018-01-30 MED ORDER — LIDOCAINE HCL (PF) 1 % IJ SOLN
INTRAMUSCULAR | Status: AC
  Filled 2018-01-30: qty 60

## 2018-01-30 MED ORDER — MIDAZOLAM HCL 5 MG/5ML IJ SOLN
INTRAMUSCULAR | Status: DC | PRN
  Administered 2018-01-30 (×6): 1 mg via INTRAVENOUS
  Administered 2018-01-30: 2 mg via INTRAVENOUS

## 2018-01-30 MED ORDER — HEPARIN (PORCINE) IN NACL 1000-0.9 UT/500ML-% IV SOLN
INTRAVENOUS | Status: AC
  Filled 2018-01-30: qty 500

## 2018-01-30 SURGICAL SUPPLY — 7 items
CABLE SURGICAL S-101-97-12 (CABLE) ×1 IMPLANT
ICD ELLIPSE VR CD1411-36C (ICD Generator) ×1 IMPLANT
LEAD DURATA 7122-65CM (Lead) ×1 IMPLANT
PAD DEFIB LIFELINK (PAD) ×1 IMPLANT
SHEATH CLASSIC 7F (SHEATH) ×1 IMPLANT
SHEATH CLASSIC 8F (SHEATH) IMPLANT
TRAY PACEMAKER INSERTION (PACKS) ×1 IMPLANT

## 2018-01-30 NOTE — H&P (View-Only) (Signed)
Progress Note  Patient Name: Daryl Rodriguez Date of Encounter: 01/30/2018  Primary Cardiologist: Shelva Majestic, MD   Subjective   No CP, palpitations or SOB, anxious to go home  Inpatient Medications    Scheduled Meds: . aspirin EC  81 mg Oral Daily  . atorvastatin  80 mg Oral q1800  . glimepiride  4 mg Oral Q breakfast  . insulin aspart  0-5 Units Subcutaneous QHS  . insulin aspart  0-9 Units Subcutaneous TID WC  . insulin glargine  10 Units Subcutaneous QHS  . iron polysaccharides  150 mg Oral Daily  . isosorbide mononitrate  30 mg Oral Daily  . latanoprost  1 drop Both Eyes QHS  . metoprolol tartrate  50 mg Oral BID  . pantoprazole  40 mg Oral BID  . terazosin  1 mg Oral Daily  . ticagrelor  90 mg Oral BID   Continuous Infusions: . sodium chloride Stopped (01/23/18 1400)  . amiodarone 30 mg/hr (01/30/18 0800)   PRN Meds: sodium chloride, acetaminophen, alum & mag hydroxide-simeth, loperamide, nitroGLYCERIN, ondansetron (ZOFRAN) IV, prochlorperazine, senna-docusate, sodium chloride flush, zolpidem   Vital Signs    Vitals:   01/30/18 0700 01/30/18 0800 01/30/18 0823 01/30/18 0830  BP: (!) 143/70   132/62  Pulse: 77 76  73  Resp: (!) 21 (!) 21  (!) 26  Temp:   97.9 F (36.6 C)   TempSrc:   Oral   SpO2: 98% 98%  99%  Weight:      Height:        Intake/Output Summary (Last 24 hours) at 01/30/2018 0855 Last data filed at 01/30/2018 0800 Gross per 24 hour  Intake 613.49 ml  Output 530 ml  Net 83.49 ml   Filed Weights   01/27/18 0600 01/28/18 1710 01/29/18 0407  Weight: 205 lb 14.6 oz (93.4 kg) 209 lb (94.8 kg) 208 lb 11.2 oz (94.7 kg)    Telemetry    SR, no recurrent VT since arrest yesterday AM - Personally Reviewed  ECG    No new EKGs - Personally Reviewed  Physical Exam   GEN: No acute distress.   Neck: No JVD Cardiac: RRR, no murmurs, rubs, or gallops.  Respiratory: CTA b/l. GI: Soft, nontender, non-distended  MS: No edema; No  deformity. Neuro:  Nonfocal  Psych: Normal affect   Labs    Chemistry Recent Labs  Lab 01/28/18 0426 01/29/18 0352 01/30/18 0334  NA 136 136 137  K 3.7 3.8 3.9  CL 103 106 105  CO2 25 23 24   GLUCOSE 110* 108* 151*  BUN 26* 22* 20  CREATININE 1.19 1.15 1.13  CALCIUM 8.4* 8.3* 8.3*  ALBUMIN 2.4* 2.4* 2.4*  GFRNONAA 55* 58* 59*  GFRAA >60 >60 >60  ANIONGAP 8 7 8      Hematology Recent Labs  Lab 01/28/18 0426 01/29/18 0352 01/30/18 0334  WBC 10.4 9.5 11.9*  RBC 2.64* 2.60* 2.67*  HGB 8.3* 8.0* 8.2*  HCT 25.3* 25.2* 25.8*  MCV 95.8 96.9 96.6  MCH 31.4 30.8 30.7  MCHC 32.8 31.7 31.8  RDW 14.5 14.8 14.9  PLT 288 286 312    Cardiac Enzymes Recent Labs  Lab 01/24/18 1927  TROPONINI 3.19*   No results for input(s): TROPIPOC in the last 168 hours.   BNPNo results for input(s): BNP, PROBNP in the last 168 hours.   DDimer No results for input(s): DDIMER in the last 168 hours.   Radiology    No results found.  Cardiac  Studies   01/25/18; LHC  Prox LAD to Mid LAD lesion is 70% stenosed.  Mid Cx lesion is 75% stenosed.  Dist RCA lesion is 40% stenosed.  Mid RCA lesion is 40% stenosed.  Previously placed Prox RCA to Mid RCA stent (unknown type) is widely patent.   1.  Widely patent RCA stent with no significant restenosis.  Unchanged borderline significant calcified disease affecting the LAD and left circumflex. 2.  Moderately elevated left ventricular end-diastolic pressure.  Left ventricular angiography was not performed. 3.  Difficult procedure via the right radial artery due to significant tortuosity affecting the right subclavian and innominate arteries.  Recommendations: Continue medical therapy. Continue treatment for ventricular tachycardia. Not able to hydrate the patient given elevated left ventricular end-diastolic pressure.  Only 40 mL of contrast was used.  Avoid aggressive diuresis at least in the next 24 hours. The patient converted from  atrial fibrillation to sinus rhythm during the procedure.  01/23/18: TTE Study Conclusions - Left ventricle: The cavity size was mildly dilated. Systolic   function was mildly to moderately reduced. The estimated ejection   fraction was in the range of 40% to 45%. Diffuse hypokinesis.   Doppler parameters are consistent with abnormal left ventricular   relaxation (grade 1 diastolic dysfunction). - Mitral valve: There was mild regurgitation. - Left atrium: The atrium was mildly dilated. - Right ventricle: The cavity size was moderately dilated. Wall   thickness was normal. - Tricuspid valve: There was mild regurgitation. - Pulmonary arteries: Systolic pressure was mildly increased. PA   peak pressure: 35 mm Hg (S).  Cardiac catheterization (01/22/2018) Conclusion    Mid Cx lesion is 75% stenosed.  Prox LAD to Mid LAD lesion is 65% stenosed.  Dist RCA lesion is 20% stenosed.  Mid RCA lesion is 30% stenosed.  Prox RCA to Mid RCA lesion is 100% stenosed.  Post intervention, there is a 0% residual stenosis.  A stent was successfully placed.  Acute ST segment elevation myocardial infarction secondary to total occlusion of a calcified mid right coronary artery with thrombus formation with initial TIMI 0 flow.  Coronary calcification with multivessel CAD and additional 60-70% calcified stenosis of the mid LAD and 75% stenoses of the left circumflex vessel.  Difficult but successful percutaneous coronary intervention to the totally occluded calcified mid RCA with PTCA, and ultimate insertion of a 3.024 mm Synergy DES stent postdilated to 3.25 mm with the 100% occlusion being reduced to 0% and restoration of brisk TIMI-3 flow.  RECOMMENDATION: Dual antiplatelet therapy for minimum of 1 year and probably indefinitely. Continue Aggrastat for 18 hours post procedure. Initial medical therapy for concomitant CAD. Due to continued hypoxemia, the patient may benefit from BiPAP therapy  in the CCU with continued IV diuresis. A 2-D echo Doppler study will be scheduled to assess LV function and valvular architecture. High potency statin therapy, and guideline directed optimal post MI treatment.      Patient Profile     84 y.o. male with a history of hypertension, diabetes and hyperlipidemia admitted 01/22/18 w/ IW STEMI, underwent with PCI and drug-eluting stenting via the right radial approach by Dr. Claiborne Billings with an excellent result. The lesion was thrombotic. He had moderate disease in his LAD and circumflex.  He had recurrent VT underwent cardiac catheterization this 01/25/18 revealing unchanged anatomy.     Assessment & Plan    1. VT arrest yesterday     Planned for ICD implant today     Remains on  amiodarone gtt  DAPT increased risk of bleeding complications, dw/w patient, unable to hold Dr. Lovena Le has seen and examined the patient this morning, discussed planned ICD implant, risks/benefits, the patient has no follow up questions, agreeable to proceed  2. S/p IW STEMI     On Imdur, statinASA, Brilinta  3. ARI     Creat continues to improve   For questions or updates, please contact [place] Please consult [email or web address] for contact info under Cardiology/STEMI.      Signed, Baldwin Jamaica, PA-C  01/30/2018, 8:55 AM    EP Attending  Patient seen and examined. Agree with above. He has had no recurrent VT overnight. We will proceed with ICD insertion. I have discussed the indications/risks/benefits/goals/expectations of ICD insertion and he wishes to proceed.  [name].D.

## 2018-01-30 NOTE — Progress Notes (Signed)
PT Cancellation Note  Patient Details Name: Daryl Rodriguez MRN: [id number] DOB: [birth year]   Cancelled Treatment:    Reason Eval/Treat Not Completed: Patient not medically ready . Pt going for an ICD placement today. Pt on strict bedrest. PT to return as able as appropriate s/p procedure.  Kittie Plater, PT, DPT Pager #: [phone] Office #: [phone]   [place] 01/30/2018, 10:11 AM

## 2018-01-30 NOTE — Progress Notes (Addendum)
Progress Note  Patient Name: Daryl Rodriguez Date of Encounter: 01/30/2018  Primary Cardiologist: Shelva Majestic, MD   Subjective   No CP, palpitations or SOB, anxious to go home  Inpatient Medications    Scheduled Meds: . aspirin EC  81 mg Oral Daily  . atorvastatin  80 mg Oral q1800  . glimepiride  4 mg Oral Q breakfast  . insulin aspart  0-5 Units Subcutaneous QHS  . insulin aspart  0-9 Units Subcutaneous TID WC  . insulin glargine  10 Units Subcutaneous QHS  . iron polysaccharides  150 mg Oral Daily  . isosorbide mononitrate  30 mg Oral Daily  . latanoprost  1 drop Both Eyes QHS  . metoprolol tartrate  50 mg Oral BID  . pantoprazole  40 mg Oral BID  . terazosin  1 mg Oral Daily  . ticagrelor  90 mg Oral BID   Continuous Infusions: . sodium chloride Stopped (01/23/18 1400)  . amiodarone 30 mg/hr (01/30/18 0800)   PRN Meds: sodium chloride, acetaminophen, alum & mag hydroxide-simeth, loperamide, nitroGLYCERIN, ondansetron (ZOFRAN) IV, prochlorperazine, senna-docusate, sodium chloride flush, zolpidem   Vital Signs    Vitals:   01/30/18 0700 01/30/18 0800 01/30/18 0823 01/30/18 0830  BP: (!) 143/70   132/62  Pulse: 77 76  73  Resp: (!) 21 (!) 21  (!) 26  Temp:   97.9 F (36.6 C)   TempSrc:   Oral   SpO2: 98% 98%  99%  Weight:      Height:        Intake/Output Summary (Last 24 hours) at 01/30/2018 0855 Last data filed at 01/30/2018 0800 Gross per 24 hour  Intake 613.49 ml  Output 530 ml  Net 83.49 ml   Filed Weights   01/27/18 0600 01/28/18 1710 01/29/18 0407  Weight: 205 lb 14.6 oz (93.4 kg) 209 lb (94.8 kg) 208 lb 11.2 oz (94.7 kg)    Telemetry    SR, no recurrent VT since arrest yesterday AM - Personally Reviewed  ECG    No new EKGs - Personally Reviewed  Physical Exam   GEN: No acute distress.   Neck: No JVD Cardiac: RRR, no murmurs, rubs, or gallops.  Respiratory: CTA b/l. GI: Soft, nontender, non-distended  MS: No edema; No  deformity. Neuro:  Nonfocal  Psych: Normal affect   Labs    Chemistry Recent Labs  Lab 01/28/18 0426 01/29/18 0352 01/30/18 0334  NA 136 136 137  K 3.7 3.8 3.9  CL 103 106 105  CO2 25 23 24   GLUCOSE 110* 108* 151*  BUN 26* 22* 20  CREATININE 1.19 1.15 1.13  CALCIUM 8.4* 8.3* 8.3*  ALBUMIN 2.4* 2.4* 2.4*  GFRNONAA 55* 58* 59*  GFRAA >60 >60 >60  ANIONGAP 8 7 8      Hematology Recent Labs  Lab 01/28/18 0426 01/29/18 0352 01/30/18 0334  WBC 10.4 9.5 11.9*  RBC 2.64* 2.60* 2.67*  HGB 8.3* 8.0* 8.2*  HCT 25.3* 25.2* 25.8*  MCV 95.8 96.9 96.6  MCH 31.4 30.8 30.7  MCHC 32.8 31.7 31.8  RDW 14.5 14.8 14.9  PLT 288 286 312    Cardiac Enzymes Recent Labs  Lab 01/24/18 1927  TROPONINI 3.19*   No results for input(s): TROPIPOC in the last 168 hours.   BNPNo results for input(s): BNP, PROBNP in the last 168 hours.   DDimer No results for input(s): DDIMER in the last 168 hours.   Radiology    No results found.  Cardiac  Studies   01/25/18; LHC  Prox LAD to Mid LAD lesion is 70% stenosed.  Mid Cx lesion is 75% stenosed.  Dist RCA lesion is 40% stenosed.  Mid RCA lesion is 40% stenosed.  Previously placed Prox RCA to Mid RCA stent (unknown type) is widely patent.   1.  Widely patent RCA stent with no significant restenosis.  Unchanged borderline significant calcified disease affecting the LAD and left circumflex. 2.  Moderately elevated left ventricular end-diastolic pressure.  Left ventricular angiography was not performed. 3.  Difficult procedure via the right radial artery due to significant tortuosity affecting the right subclavian and innominate arteries.  Recommendations: Continue medical therapy. Continue treatment for ventricular tachycardia. Not able to hydrate the patient given elevated left ventricular end-diastolic pressure.  Only 40 mL of contrast was used.  Avoid aggressive diuresis at least in the next 24 hours. The patient converted from  atrial fibrillation to sinus rhythm during the procedure.  01/23/18: TTE Study Conclusions - Left ventricle: The cavity size was mildly dilated. Systolic   function was mildly to moderately reduced. The estimated ejection   fraction was in the range of 40% to 45%. Diffuse hypokinesis.   Doppler parameters are consistent with abnormal left ventricular   relaxation (grade 1 diastolic dysfunction). - Mitral valve: There was mild regurgitation. - Left atrium: The atrium was mildly dilated. - Right ventricle: The cavity size was moderately dilated. Wall   thickness was normal. - Tricuspid valve: There was mild regurgitation. - Pulmonary arteries: Systolic pressure was mildly increased. PA   peak pressure: 35 mm Hg (S).  Cardiac catheterization (01/22/2018) Conclusion    Mid Cx lesion is 75% stenosed.  Prox LAD to Mid LAD lesion is 65% stenosed.  Dist RCA lesion is 20% stenosed.  Mid RCA lesion is 30% stenosed.  Prox RCA to Mid RCA lesion is 100% stenosed.  Post intervention, there is a 0% residual stenosis.  A stent was successfully placed.  Acute ST segment elevation myocardial infarction secondary to total occlusion of a calcified mid right coronary artery with thrombus formation with initial TIMI 0 flow.  Coronary calcification with multivessel CAD and additional 60-70% calcified stenosis of the mid LAD and 75% stenoses of the left circumflex vessel.  Difficult but successful percutaneous coronary intervention to the totally occluded calcified mid RCA with PTCA, and ultimate insertion of a 3.024 mm Synergy DES stent postdilated to 3.25 mm with the 100% occlusion being reduced to 0% and restoration of brisk TIMI-3 flow.  RECOMMENDATION: Dual antiplatelet therapy for minimum of 1 year and probably indefinitely. Continue Aggrastat for 18 hours post procedure. Initial medical therapy for concomitant CAD. Due to continued hypoxemia, the patient may benefit from BiPAP therapy  in the CCU with continued IV diuresis. A 2-D echo Doppler study will be scheduled to assess LV function and valvular architecture. High potency statin therapy, and guideline directed optimal post MI treatment.      Patient Profile     84 y.o. male with a history of hypertension, diabetes and hyperlipidemia admitted 01/22/18 w/ IW STEMI, underwent with PCI and drug-eluting stenting via the right radial approach by Dr. Claiborne Billings with an excellent result. The lesion was thrombotic. He had moderate disease in his LAD and circumflex.  He had recurrent VT underwent cardiac catheterization this 01/25/18 revealing unchanged anatomy.     Assessment & Plan    1. VT arrest yesterday     Planned for ICD implant today     Remains on  amiodarone gtt  DAPT increased risk of bleeding complications, dw/w patient, unable to hold Dr. Lovena Le has seen and examined the patient this morning, discussed planned ICD implant, risks/benefits, the patient has no follow up questions, agreeable to proceed  2. S/p IW STEMI     On Imdur, statinASA, Brilinta  3. ARI     Creat continues to improve   For questions or updates, please contact [place] Please consult [email or web address] for contact info under Cardiology/STEMI.      Signed, Baldwin Jamaica, PA-C  01/30/2018, 8:55 AM    EP Attending  Patient seen and examined. Agree with above. He has had no recurrent VT overnight. We will proceed with ICD insertion. I have discussed the indications/risks/benefits/goals/expectations of ICD insertion and he wishes to proceed.  [name].D.

## 2018-01-30 NOTE — Progress Notes (Signed)
Progress Note  Patient Name: Daryl Rodriguez Date of Encounter: 01/30/2018  Primary Cardiologist: Shelva Majestic, MD   Subjective   No dizziness or palpitations.  Mild chest soreness.  Inpatient Medications    Scheduled Meds: . aspirin EC  81 mg Oral Daily  . atorvastatin  80 mg Oral q1800  . gentamicin irrigation  80 mg Irrigation On Call  . glimepiride  4 mg Oral Q breakfast  . insulin aspart  0-5 Units Subcutaneous QHS  . insulin aspart  0-9 Units Subcutaneous TID WC  . insulin glargine  10 Units Subcutaneous QHS  . iron polysaccharides  150 mg Oral Daily  . isosorbide mononitrate  30 mg Oral Daily  . latanoprost  1 drop Both Eyes QHS  . metoprolol tartrate  50 mg Oral BID  . pantoprazole  40 mg Oral BID  . sodium chloride flush  3 mL Intravenous Q12H  . terazosin  1 mg Oral Daily  . ticagrelor  90 mg Oral BID   Continuous Infusions: . sodium chloride Stopped (01/23/18 1400)  . sodium chloride    . sodium chloride    . amiodarone 30 mg/hr (01/30/18 0800)  .  ceFAZolin (ANCEF) IV     PRN Meds: sodium chloride, acetaminophen, alum & mag hydroxide-simeth, loperamide, nitroGLYCERIN, ondansetron (ZOFRAN) IV, prochlorperazine, senna-docusate, sodium chloride flush, sodium chloride flush, zolpidem   Vital Signs    Vitals:   01/30/18 0700 01/30/18 0800 01/30/18 0823 01/30/18 0830  BP: (!) 143/70   132/62  Pulse: 77 76  73  Resp: (!) 21 (!) 21  (!) 26  Temp:   97.9 F (36.6 C)   TempSrc:   Oral   SpO2: 98% 98%  99%  Weight:      Height:        Intake/Output Summary (Last 24 hours) at 01/30/2018 0942 Last data filed at 01/30/2018 0800 Gross per 24 hour  Intake 613.49 ml  Output 530 ml  Net 83.49 ml   Filed Weights   01/27/18 0600 01/28/18 1710 01/29/18 0407  Weight: 205 lb 14.6 oz (93.4 kg) 209 lb (94.8 kg) 208 lb 11.2 oz (94.7 kg)    Telemetry     NSR- Personally Reviewed  ECG    None recent  Physical Exam   GEN: No acute distress.   Neck: No  JVD Cardiac: RRR, no murmurs, rubs, or gallops.  Respiratory: Clear to auscultation bilaterally. GI: Soft, nontender, non-distended  MS: No edema; No deformity. Neuro:  Nonfocal  Psych: Normal affect   Labs    Chemistry Recent Labs  Lab 01/28/18 0426 01/29/18 0352 01/30/18 0334  NA 136 136 137  K 3.7 3.8 3.9  CL 103 106 105  CO2 25 23 24   GLUCOSE 110* 108* 151*  BUN 26* 22* 20  CREATININE 1.19 1.15 1.13  CALCIUM 8.4* 8.3* 8.3*  ALBUMIN 2.4* 2.4* 2.4*  GFRNONAA 55* 58* 59*  GFRAA >60 >60 >60  ANIONGAP 8 7 8      Hematology Recent Labs  Lab 01/28/18 0426 01/29/18 0352 01/30/18 0334  WBC 10.4 9.5 11.9*  RBC 2.64* 2.60* 2.67*  HGB 8.3* 8.0* 8.2*  HCT 25.3* 25.2* 25.8*  MCV 95.8 96.9 96.6  MCH 31.4 30.8 30.7  MCHC 32.8 31.7 31.8  RDW 14.5 14.8 14.9  PLT 288 286 312    Cardiac Enzymes Recent Labs  Lab 01/24/18 1927  TROPONINI 3.19*   No results for input(s): TROPIPOC in the last 168 hours.   BNPNo results  for input(s): BNP, PROBNP in the last 168 hours.   DDimer No results for input(s): DDIMER in the last 168 hours.   Radiology    No results found.  Cardiac Studies     Patient Profile     84 y.o. male with lateral MI, VT.  Assessment & Plan    1) Continue DAPT and aggressive secondary prevention.  No anginal pain.  CP likely related to CPR.   2) AICD placement later today.  For questions or updates, please contact [place] Please consult [email or web address] for contact info under Cardiology/STEMI.      Signed, Larae Grooms, MD  01/30/2018, 9:42 AM

## 2018-01-30 NOTE — Progress Notes (Signed)
Placed patient on home CPAP. 

## 2018-01-30 NOTE — Interval H&P Note (Signed)
History and Physical Interval Note:  01/30/2018 11:40 AM  Daryl Rodriguez  has presented today for surgery, with the diagnosis of cardiac arrest  The various methods of treatment have been discussed with the patient and family. After consideration of risks, benefits and other options for treatment, the patient has consented to  Procedure(s): ICD IMPLANT (N/A) as a surgical intervention .  The patient's history has been reviewed, patient examined, no change in status, stable for surgery.  I have reviewed the patient's chart and labs.  Questions were answered to the patient's satisfaction.     Cristopher Peru

## 2018-01-31 ENCOUNTER — Inpatient Hospital Stay (HOSPITAL_COMMUNITY): Payer: Medicare Other

## 2018-01-31 DIAGNOSIS — I472 Ventricular tachycardia, unspecified: Secondary | ICD-10-CM

## 2018-01-31 LAB — RENAL FUNCTION PANEL
ALBUMIN: 2.2 g/dL — AB (ref 3.5–5.0)
ANION GAP: 8 (ref 5–15)
BUN: 23 mg/dL — ABNORMAL HIGH (ref 6–20)
CALCIUM: 8.1 mg/dL — AB (ref 8.9–10.3)
CO2: 24 mmol/L (ref 22–32)
CREATININE: 1.25 mg/dL — AB (ref 0.61–1.24)
Chloride: 104 mmol/L (ref 101–111)
GFR calc Af Amer: >60 mL/min (ref >60)
GFR calc non Af Amer: 52 mL/min — ABNORMAL LOW (ref >60)
GLUCOSE: 178 mg/dL — AB (ref 65–99)
PHOSPHORUS: 3.1 mg/dL (ref 2.5–4.6)
Potassium: 4.2 mmol/L (ref 3.5–5.1)
SODIUM: 136 mmol/L (ref 135–145)

## 2018-01-31 LAB — GLUCOSE, CAPILLARY
GLUCOSE-CAPILLARY: 142 mg/dL — AB (ref 65–99)
GLUCOSE-CAPILLARY: 188 mg/dL — AB (ref 65–99)
Glucose-Capillary: 164 mg/dL — ABNORMAL HIGH (ref 65–99)
Glucose-Capillary: 100 mg/dL — ABNORMAL HIGH (ref 65–99)
Glucose-Capillary: 153 mg/dL — ABNORMAL HIGH (ref 65–99)

## 2018-01-31 MED ORDER — AMIODARONE HCL 200 MG PO TABS
400.0000 mg | ORAL_TABLET | Freq: Every day | ORAL | Status: DC
  Administered 2018-01-31 – 2018-02-01 (×2): 400 mg via ORAL
  Filled 2018-01-31 (×2): qty 2

## 2018-01-31 NOTE — Progress Notes (Addendum)
Progress Note  Patient Name: Daryl Rodriguez Date of Encounter: 01/31/2018  Primary Cardiologist: Shelva Majestic, MD   Subjective   No CP, palpitations or SOB, feels well, wants to go home  Inpatient Medications    Scheduled Meds: . aspirin EC  81 mg Oral Daily  . atorvastatin  80 mg Oral q1800  . glimepiride  4 mg Oral Q breakfast  . insulin aspart  0-5 Units Subcutaneous QHS  . insulin aspart  0-9 Units Subcutaneous TID WC  . insulin glargine  10 Units Subcutaneous QHS  . iron polysaccharides  150 mg Oral Daily  . isosorbide mononitrate  30 mg Oral Daily  . latanoprost  1 drop Both Eyes QHS  . metoprolol tartrate  50 mg Oral BID  . pantoprazole  40 mg Oral BID  . terazosin  1 mg Oral Daily  . ticagrelor  90 mg Oral BID   Continuous Infusions: . sodium chloride Stopped (01/23/18 1400)  . amiodarone 30 mg/hr (01/31/18 0534)  .  ceFAZolin (ANCEF) IV Stopped (01/31/18 0719)   PRN Meds: sodium chloride, acetaminophen, alum & mag hydroxide-simeth, loperamide, nitroGLYCERIN, ondansetron (ZOFRAN) IV, prochlorperazine, senna-docusate, sodium chloride flush, zolpidem   Vital Signs    Vitals:   01/31/18 0530 01/31/18 0600 01/31/18 0630 01/31/18 0735  BP:    (!) 124/56  Pulse: 71 67 66   Resp: (!) 29 19 (!) 21   Temp:    98.4 F (36.9 C)  TempSrc:    Oral  SpO2: 96% 98% 98% 95%  Weight:    212 lb 8.4 oz (96.4 kg)  Height:        Intake/Output Summary (Last 24 hours) at 01/31/2018 1021 Last data filed at 01/31/2018 0600 Gross per 24 hour  Intake 937.4 ml  Output 80 ml  Net 857.4 ml   Filed Weights   01/28/18 1710 01/29/18 0407 01/31/18 0735  Weight: 209 lb (94.8 kg) 208 lb 11.2 oz (94.7 kg) 212 lb 8.4 oz (96.4 kg)    Telemetry    SR, no recurrent VT- Personally Reviewed  ECG    SR - Personally Reviewed  Physical Exam   GEN: No acute distress.   Neck: No JVD Cardiac: RRR, no murmurs, rubs, or gallops.  Respiratory: CTA b/l. GI: Soft, nontender,  non-distended  MS: No edema; No deformity. Neuro:  Nonfocal  Psych: Normal affect   L chest: ICD site is dry, no bleeding or hematoma  Labs    Chemistry Recent Labs  Lab 01/29/18 0352 01/30/18 0334 01/31/18 0242  NA 136 137 136  K 3.8 3.9 4.2  CL 106 105 104  CO2 23 24 24   GLUCOSE 108* 151* 178*  BUN 22* 20 23*  CREATININE 1.15 1.13 1.25*  CALCIUM 8.3* 8.3* 8.1*  ALBUMIN 2.4* 2.4* 2.2*  GFRNONAA 58* 59* 52*  GFRAA >60 >60 >60  ANIONGAP 7 8 8      Hematology Recent Labs  Lab 01/28/18 0426 01/29/18 0352 01/30/18 0334  WBC 10.4 9.5 11.9*  RBC 2.64* 2.60* 2.67*  HGB 8.3* 8.0* 8.2*  HCT 25.3* 25.2* 25.8*  MCV 95.8 96.9 96.6  MCH 31.4 30.8 30.7  MCHC 32.8 31.7 31.8  RDW 14.5 14.8 14.9  PLT 288 286 312    Cardiac Enzymes Recent Labs  Lab 01/24/18 1927  TROPONINI 3.19*   No results for input(s): TROPIPOC in the last 168 hours.   BNPNo results for input(s): BNP, PROBNP in the last 168 hours.   DDimer No results  for input(s): DDIMER in the last 168 hours.   Radiology    Dg Chest 2 View Result Date: 01/31/2018 CLINICAL DATA:  Status post defibrillator placement. EXAM: CHEST - 2 VIEW COMPARISON:  Radiograph of Jan 26, 2018. FINDINGS: Stable cardiomediastinal silhouette. Atherosclerosis of thoracic aorta is noted. Single lead left-sided pacemaker is noted with lead in grossly good position. No pneumothorax is noted. No significant pleural effusion is noted. Minimal bibasilar subsegmental atelectasis is noted. IMPRESSION: Single lead left-sided pacemaker in grossly good position. No pneumothorax is noted. Minimal bibasilar subsegmental atelectasis. Aortic Atherosclerosis (ICD10-I70.0). Electronically Signed   By: Marijo Conception, M.D.   On: 01/31/2018 07:24    Cardiac Studies   01/25/18; LHC  Prox LAD to Mid LAD lesion is 70% stenosed.  Mid Cx lesion is 75% stenosed.  Dist RCA lesion is 40% stenosed.  Mid RCA lesion is 40% stenosed.  Previously placed Prox  RCA to Mid RCA stent (unknown type) is widely patent.   1.  Widely patent RCA stent with no significant restenosis.  Unchanged borderline significant calcified disease affecting the LAD and left circumflex. 2.  Moderately elevated left ventricular end-diastolic pressure.  Left ventricular angiography was not performed. 3.  Difficult procedure via the right radial artery due to significant tortuosity affecting the right subclavian and innominate arteries.  Recommendations: Continue medical therapy. Continue treatment for ventricular tachycardia. Not able to hydrate the patient given elevated left ventricular end-diastolic pressure.  Only 40 mL of contrast was used.  Avoid aggressive diuresis at least in the next 24 hours. The patient converted from atrial fibrillation to sinus rhythm during the procedure.  01/23/18: TTE Study Conclusions - Left ventricle: The cavity size was mildly dilated. Systolic   function was mildly to moderately reduced. The estimated ejection   fraction was in the range of 40% to 45%. Diffuse hypokinesis.   Doppler parameters are consistent with abnormal left ventricular   relaxation (grade 1 diastolic dysfunction). - Mitral valve: There was mild regurgitation. - Left atrium: The atrium was mildly dilated. - Right ventricle: The cavity size was moderately dilated. Wall   thickness was normal. - Tricuspid valve: There was mild regurgitation. - Pulmonary arteries: Systolic pressure was mildly increased. PA   peak pressure: 35 mm Hg (S).  Cardiac catheterization (01/22/2018) Conclusion    Mid Cx lesion is 75% stenosed.  Prox LAD to Mid LAD lesion is 65% stenosed.  Dist RCA lesion is 20% stenosed.  Mid RCA lesion is 30% stenosed.  Prox RCA to Mid RCA lesion is 100% stenosed.  Post intervention, there is a 0% residual stenosis.  A stent was successfully placed.  Acute ST segment elevation myocardial infarction secondary to total occlusion of a calcified  mid right coronary artery with thrombus formation with initial TIMI 0 flow.  Coronary calcification with multivessel CAD and additional 60-70% calcified stenosis of the mid LAD and 75% stenoses of the left circumflex vessel.  Difficult but successful percutaneous coronary intervention to the totally occluded calcified mid RCA with PTCA, and ultimate insertion of a 3.024 mm Synergy DES stent postdilated to 3.25 mm with the 100% occlusion being reduced to 0% and restoration of brisk TIMI-3 flow.  RECOMMENDATION: Dual antiplatelet therapy for minimum of 1 year and probably indefinitely. Continue Aggrastat for 18 hours post procedure. Initial medical therapy for concomitant CAD. Due to continued hypoxemia, the patient may benefit from BiPAP therapy in the CCU with continued IV diuresis. A 2-D echo Doppler study will be scheduled to  assess LV function and valvular architecture. High potency statin therapy, and guideline directed optimal post MI treatment.      Patient Profile     84 y.o. male with a history of hypertension, diabetes and hyperlipidemia admitted 01/22/18 w/ IW STEMI, underwent with PCI and drug-eluting stenting via the right radial approach by Dr. Claiborne Billings with an excellent result. The lesion was thrombotic. He had moderate disease in his LAD and circumflex.  He had recurrent VT underwent cardiac catheterization this 01/25/18 revealing unchanged anatomy.     Assessment & Plan    1. VT arrest 01/29/18     S/p ICD implant yesterday w/Dr. Lovena Le     Site is stable     Device check this AM with intact function/stable measurements     CXR this AM without ptx     Site is stable, no hematoma, pressure dressing was removed     Teaching/care/activity instructions were discussed with the patient/daughter at bedside     The patient was made aware of [place] law, no driving for 6 months  Recommend stop amio gtt today >> PO 400mg  daily.  If no VT 24 hours on PO amio, could discharge  from EP perspective  2. S/p IW STEMI     On Imdur, statinASA, Brilinta  3. ARI     Creat waxing/waning  4. PAFib     CHA2DS2Vasc is 6     He is on DAPT, Dr. Lovena Le discussed at length with patient/daughter at bedside     His AF has been brief, would try to avoid triple therapy, especially s/p ICD implant     Will need to follow this going forward   EP service remains available, we will sign off, though remain available, please recall if needed.   For questions or updates, please contact [place] Please consult [email or web address] for contact info under Cardiology/STEMI.      Signed, Baldwin Jamaica, PA-C  01/31/2018, 10:21 AM    EP Attending  Patient seen and examined. Agree with above. The patient is doing well after ICD insertion. We will progress his activity and plan to DC home tomorrow. Interogation of his ICD under my direction demonstrates normal device function.  [name].D.

## 2018-01-31 NOTE — Progress Notes (Signed)
Occupational Therapy Treatment Patient Details Name: Daryl Rodriguez MRN: [id number] DOB: [birth year] Today's Date: 01/31/2018    History of present illness 84 y.o. admitted with SOB and BLE edema for past 2 weeks with chest pain. Stent placement in RCA 5/20, 5/22 devolved v tach requiring cardioversion 5/23. PMH includes: HTN, DM2, CKD III, Peripheral vascular disease, Wrist surgery, back surgery.    OT comments  Pt progressing towards established OT goals. Pt performing grooming tasks at sink with supervision for safety. Pt becoming fatigued and requesting to return to recliner. Educating pt and daughter on compensatory techniques for UB dressing. Pt would benefit from further acute OT to continues address UB ADLs with ROM limitations. Continue to recommend dc home with HHOT.   Follow Up Recommendations  Home health OT;Supervision/Assistance - 24 hour    Equipment Recommendations  None recommended by OT    Recommendations for Other Services      Precautions / Restrictions Precautions Precautions: Fall Precaution Comments: V Tach 5/22  Restrictions Weight Bearing Restrictions: Yes(Left upper extremity for 24 hours Pst ICD placement)       Mobility Bed Mobility               General bed mobility comments: up in chair   Transfers Overall transfer level: Needs assistance Equipment used: Rolling walker (2 wheeled) Transfers: Sit to/from [facility] Sit to Stand: Min guard         General transfer comment: min guard for safety     Balance Overall balance assessment: Needs assistance Sitting-balance support: Feet supported Sitting balance-Leahy Scale: Fair       Standing balance-Leahy Scale: Fair Standing balance comment: RW for dynamic tasks                           ADL either performed or assessed with clinical judgement   ADL Overall ADL's : Needs assistance/impaired     Grooming: Oral care;Standing;Set  up;Supervision/safety Grooming Details (indicate cue type and reason): Performing oral care at sink. Adhering to precautions. supervision for safety. Fatigued and required to sit.           Upper Body Dressing Details (indicate cue type and reason): Discussed compensatory techniques for UB dressing. Daughter and pt verablize understanding. would benefit from further practice. Lower Body Dressing: Maximal assistance Lower Body Dressing Details (indicate cue type and reason): Max A to adjust socks             Functional mobility during ADLs: Rolling walker;Supervision/safety General ADL Comments: Fatigues quickly     Vision       Perception     Praxis      Cognition Arousal/Alertness: Awake/alert Behavior During Therapy: Flat affect Overall Cognitive Status: Within Functional Limits for tasks assessed                                          Exercises     Shoulder Instructions       General Comments Daughter present throughout session    Pertinent Vitals/ Pain          Home Living                                          Prior Functioning/Environment  Frequency  Min 2X/week        Progress Toward Goals  OT Goals(current goals can now be found in the care plan section)  Progress towards OT goals: Progressing toward goals  Acute Rehab OT Goals Patient Stated Goal: To get back to normal and play guitar  OT Goal Formulation: With patient Time For Goal Achievement: 02/09/18 Potential to Achieve Goals: Good ADL Goals Pt Will Perform Grooming: with modified independence;standing Pt Will Perform Upper Body Bathing: with modified independence;sitting;standing Pt Will Perform Lower Body Bathing: with modified independence;sit to/from stand Pt Will Perform Upper Body Dressing: with modified independence;sitting;standing Pt Will Perform Lower Body Dressing: with modified independence;sit to/from stand Pt  Will Transfer to Toilet: with modified independence;ambulating;regular height toilet;grab bars Pt Will Perform Toileting - Clothing Manipulation and hygiene: with modified independence;sit to/from stand Pt Will Perform Tub/Shower Transfer: Shower transfer;with modified independence;ambulating;shower seat;grab bars;rolling walker Additional ADL Goal #1: Pt will be independent with energy conservation techniques during ADLs  Plan Discharge plan remains appropriate    Co-evaluation                 AM-PAC PT "6 Clicks" Daily Activity     Outcome Measure   Help from another person eating meals?: None Help from another person taking care of personal grooming?: A Little Help from another person toileting, which includes using toliet, bedpan, or urinal?: A Little Help from another person bathing (including washing, rinsing, drying)?: A Little Help from another person to put on and taking off regular upper body clothing?: A Little Help from another person to put on and taking off regular lower body clothing?: A Little 6 Click Score: 19    End of Session Equipment Utilized During Treatment: Rolling walker  OT Visit Diagnosis: Unsteadiness on feet (R26.81)   Activity Tolerance Patient tolerated treatment well;Patient limited by fatigue   Patient Left in chair;with call bell/phone within reach;with family/visitor present   Nurse Communication Mobility status        Time: 3614-4315 OT Time Calculation (min): 20 min  Charges: OT General Charges $OT Visit: 1 Visit OT Treatments $Self Care/Home Management : 8-22 mins  Kristain Hu MSOT, OTR/L Acute Rehab Pager: [phone] Office: [place] 01/31/2018, 4:26 PM

## 2018-01-31 NOTE — Progress Notes (Signed)
Patient has home CPAP at bedside within reach.  Family member filled CPAP with sterile water.  RT placed home CPAP mask on patient and fitted for patient comfort.

## 2018-01-31 NOTE — Progress Notes (Signed)
Physical Therapy Treatment Patient Details Name: Daryl Rodriguez MRN: [id number] DOB: 03-10-1934 Today's Date: 01/31/2018    History of Present Illness 84 y.o. admitted with SOB and BLE edema for past 2 weeks with chest pain. Stent placement in RCA 5/20, 5/22 devolved v tach requiring cardioversion 5/23. PMH includes: HTN, DM2, CKD III, Peripheral vascular disease, Wrist surgery, back surgery.     PT Comments    Patient is progressing very well towards their physical therapy goals. Ambulating 120 feet with RW and min guard assist. Reviewed warm up/cool down exercises with patient and patient daughter. Vitals listed below.  Vitals:  Before walking - BP 122/54, HR 73 After walking - BP 153/71, HR 88    Follow Up Recommendations  Home health PT;Supervision/Assistance - 24 hour     Equipment Recommendations  None recommended by PT((has RW))    Recommendations for Other Services       Precautions / Restrictions Precautions Precautions: Fall;ICD/Pacemaker Precaution Comments: AICD placement 5/28 Restrictions Weight Bearing Restrictions: No    Mobility  Bed Mobility               General bed mobility comments: up in chair   Transfers Overall transfer level: Needs assistance Equipment used: Rolling walker (2 wheeled) Transfers: Sit to/from [facility] Sit to Stand: Min guard         General transfer comment: Needs to rock forward to gain momentum to transition from sit to stand  Ambulation/Gait Ambulation/Gait assistance: Min guard Ambulation Distance (Feet): 120 Feet Assistive device: Rolling walker (2 wheeled) Gait Pattern/deviations: Step-through pattern;Decreased stride length;Narrow base of support Gait velocity: decreased   General Gait Details: Patient with one LOB when cued for wider BOS but able to correct without additional assistance. Moderate cueing for upright posture.    Stairs             Wheelchair Mobility     Modified Rankin (Stroke Patients Only)       Balance Overall balance assessment: Needs assistance Sitting-balance support: Feet supported Sitting balance-Leahy Scale: Good       Standing balance-Leahy Scale: Fair Standing balance comment: RW for dynamic tasks                            Cognition Arousal/Alertness: Awake/alert Behavior During Therapy: Flat affect Overall Cognitive Status: Within Functional Limits for tasks assessed                                        Exercises General Exercises - Lower Extremity Long Arc Quad: 20 reps;Both;Seated Hip Flexion/Marching: 20 reps;Seated;Both Heel Raises: 20 reps;Both;Seated Other Exercises Other Exercises: Instructed patient/patient daughter on importance of warm up and cool down exercises    General Comments General comments (skin integrity, edema, etc.): Daughter present throughout session      Pertinent Vitals/Pain Pain Assessment: No/denies pain    Home Living Family/patient expects to be discharged to:: Private residence Living Arrangements: Spouse/significant other Available Help at Discharge: Family Type of Home: House Home Access: Stairs to enter Entrance Stairs-Rails: Can reach both Home Layout: One level Home Equipment: Walker - 2 wheels;Grab bars - toilet;Grab bars - tub/shower;Shower seat - built in Additional Comments: Pt's wife just out of rehab due to hip fracture.  Children are assisting     Prior Function Level of Independence: Independent  Comments: Pt reports he fatigues with grocery shopping, does very little yard work.  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 : A Little Difficulty sitting down on and standing up from a chair with arms (e.g., wheelchair, bedside commode, etc,.)?: A Little Help needed moving to and from a bed to chair (including a wheelchair)?: A Little Help needed walking in hospital room?: A Little Help needed climbing 3-5 steps with a railing? : A Lot 6 Click Score: 18    End of Session Equipment Utilized During Treatment: Gait belt Activity Tolerance: Patient tolerated treatment well Patient left: in chair;with call bell/phone within reach Nurse Communication: Mobility status PT Visit Diagnosis: Unsteadiness on feet (R26.81);Other abnormalities of gait and mobility (R26.89)     Time: 3291-9166 PT Time Calculation (min) (ACUTE ONLY): 19 min  Charges:  $Gait Training: 8-22 mins                    G Codes:       Ellamae Sia, PT, DPT Acute Rehabilitation Services  Pager: [phone]    Willy Eddy 01/31/2018, 5:23 PM

## 2018-01-31 NOTE — Progress Notes (Signed)
Pt transferred to 4E 10 in wheelchair while on amio gtt.  Report was given to [facility].

## 2018-01-31 NOTE — Progress Notes (Addendum)
Progress Note  Patient Name: Daryl Rodriguez Date of Encounter: 01/31/2018  Primary Cardiologist: Shelva Majestic, MD   Subjective   Mild chest soreness.  Inpatient Medications    Scheduled Meds: . amiodarone  400 mg Oral Daily  . aspirin EC  81 mg Oral Daily  . atorvastatin  80 mg Oral q1800  . glimepiride  4 mg Oral Q breakfast  . insulin aspart  0-5 Units Subcutaneous QHS  . insulin aspart  0-9 Units Subcutaneous TID WC  . insulin glargine  10 Units Subcutaneous QHS  . iron polysaccharides  150 mg Oral Daily  . isosorbide mononitrate  30 mg Oral Daily  . latanoprost  1 drop Both Eyes QHS  . metoprolol tartrate  50 mg Oral BID  . pantoprazole  40 mg Oral BID  . terazosin  1 mg Oral Daily  . ticagrelor  90 mg Oral BID   Continuous Infusions: . sodium chloride Stopped (01/23/18 1400)   PRN Meds: sodium chloride, acetaminophen, alum & mag hydroxide-simeth, loperamide, nitroGLYCERIN, ondansetron (ZOFRAN) IV, prochlorperazine, senna-docusate, sodium chloride flush, zolpidem   Vital Signs    Vitals:   01/31/18 0530 01/31/18 0600 01/31/18 0630 01/31/18 0735  BP:    (!) 124/56  Pulse: 71 67 66   Resp: (!) 29 19 (!) 21   Temp:    98.4 F (36.9 C)  TempSrc:    Oral  SpO2: 96% 98% 98% 95%  Weight:    212 lb 8.4 oz (96.4 kg)  Height:        Intake/Output Summary (Last 24 hours) at 01/31/2018 1541 Last data filed at 01/31/2018 0900 Gross per 24 hour  Intake 840.5 ml  Output 10 ml  Net 830.5 ml   Filed Weights   01/28/18 1710 01/29/18 0407 01/31/18 0735  Weight: 209 lb (94.8 kg) 208 lb 11.2 oz (94.7 kg) 212 lb 8.4 oz (96.4 kg)    Telemetry    NSR - Personally Reviewed  ECG    NSR, inferior Q waves - Personally Reviewed  Physical Exam   GEN: No acute distress.   Neck: No JVD Cardiac: RRR, no murmurs, rubs, or gallops.  Respiratory: Clear to auscultation bilaterally. GI: Soft, nontender, non-distended  MS: No edema; No deformity. Left chest site  intact Neuro:  Nonfocal  Psych: Normal affect   Labs    Chemistry Recent Labs  Lab 01/29/18 0352 01/30/18 0334 01/31/18 0242  NA 136 137 136  K 3.8 3.9 4.2  CL 106 105 104  CO2 23 24 24   GLUCOSE 108* 151* 178*  BUN 22* 20 23*  CREATININE 1.15 1.13 1.25*  CALCIUM 8.3* 8.3* 8.1*  ALBUMIN 2.4* 2.4* 2.2*  GFRNONAA 58* 59* 52*  GFRAA >60 >60 >60  ANIONGAP 7 8 8      Hematology Recent Labs  Lab 01/28/18 0426 01/29/18 0352 01/30/18 0334  WBC 10.4 9.5 11.9*  RBC 2.64* 2.60* 2.67*  HGB 8.3* 8.0* 8.2*  HCT 25.3* 25.2* 25.8*  MCV 95.8 96.9 96.6  MCH 31.4 30.8 30.7  MCHC 32.8 31.7 31.8  RDW 14.5 14.8 14.9  PLT 288 286 312    Cardiac Enzymes Recent Labs  Lab 01/24/18 1927  TROPONINI 3.19*   No results for input(s): TROPIPOC in the last 168 hours.   BNPNo results for input(s): BNP, PROBNP in the last 168 hours.   DDimer No results for input(s): DDIMER in the last 168 hours.   Radiology    Dg Chest 2 View  Result  Date: 01/31/2018 CLINICAL DATA:  Status post defibrillator placement. EXAM: CHEST - 2 VIEW COMPARISON:  Radiograph of Jan 26, 2018. FINDINGS: Stable cardiomediastinal silhouette. Atherosclerosis of thoracic aorta is noted. Single lead left-sided pacemaker is noted with lead in grossly good position. No pneumothorax is noted. No significant pleural effusion is noted. Minimal bibasilar subsegmental atelectasis is noted. IMPRESSION: Single lead left-sided pacemaker in grossly good position. No pneumothorax is noted. Minimal bibasilar subsegmental atelectasis. Aortic Atherosclerosis (ICD10-I70.0). Electronically Signed   By: Marijo Conception, M.D.   On: 01/31/2018 07:24    Cardiac Studies     Patient Profile     84 y.o. male with MI, AICD placed yesterday  Assessment & Plan    1) Switching Amio to oral per EP.  VTach led to AICD.  2) CAD: Will be on ASA and Brilinta.  Brilinta will be nearly $400 /month.  WIll give 1 month free coupon and Rx.  If this is  accepted, can take Brilnita for a month and then switch to Plavix.  If it is not accepted, then will switch to PLavix before hospital discharge which is anticipated tomorrow.  For questions or updates, please contact [place] Please consult [email or web address] for contact info under Cardiology/STEMI.      Signed, Larae Grooms, MD  01/31/2018, 3:41 PM

## 2018-01-31 NOTE — Discharge Instructions (Signed)
° ° °  Supplemental Discharge Instructions for  Pacemaker/Defibrillator Patients  Activity No heavy lifting or vigorous activity with your left/right arm for 6 to 8 weeks.  Do not raise your left/right arm above your head for one week.  Gradually raise your affected arm as drawn below.             02/03/18                       02/04/18                      02/05/18                     02/06/18 __  NO DRIVING for  6 months .  WOUND CARE - Keep the wound area clean and dry.  Do not get this area wet for one week. No showers for one week; you may shower on  02/06/18  . - The tape/steri-strips on your wound will fall off; do not pull them off.  No bandage is needed on the site.  DO  NOT apply any creams, oils, or ointments to the wound area. - If you notice any drainage or discharge from the wound, any swelling or bruising at the site, or you develop a fever > 101? F after you are discharged home, call the office at once.  Special Instructions - You are still able to use cellular telephones; use the ear opposite the side where you have your pacemaker/defibrillator.  Avoid carrying your cellular phone near your device. - When traveling through airports, show security personnel your identification card to avoid being screened in the metal detectors.  Ask the security personnel to use the hand wand. - Avoid arc welding equipment, MRI testing (magnetic resonance imaging), TENS units (transcutaneous nerve stimulators).  Call the office for questions about other devices. - Avoid electrical appliances that are in poor condition or are not properly grounded. - Microwave ovens are safe to be near or to operate.  Additional information for defibrillator patients should your device go off: - If your device goes off ONCE and you feel fine afterward, notify the device clinic nurses. - If your device goes off ONCE and you do not feel well afterward, call 911. - If your device goes off TWICE, call 911. - If your  device goes off THREE times in one day, call 911.  DO NOT DRIVE YOURSELF OR A FAMILY MEMBER WITH A DEFIBRILLATOR TO THE HOSPITAL--CALL 911.

## 2018-01-31 NOTE — Progress Notes (Signed)
CARDIAC REHAB PHASE I   PRE:  Rate/Rhythm: 72 SR    BP: sitting 133/57    SaO2: 95 RA  MODE:  Ambulation: 120 ft   POST:  Rate/Rhythm: 86 SR    BP: sitting 155/62     SaO2: 99 RA  Pt stood and ambulated with RW. Steady, slow, tires easily.  Return to recliner. He will need RW at home. Will f/u tomorrow for ed. [place], ACSM 01/31/2018 3:24 PM

## 2018-02-01 ENCOUNTER — Encounter (HOSPITAL_COMMUNITY): Payer: Self-pay | Admitting: Cardiology

## 2018-02-01 ENCOUNTER — Other Ambulatory Visit: Payer: Self-pay | Admitting: Cardiology

## 2018-02-01 ENCOUNTER — Telehealth: Payer: Self-pay | Admitting: Adult Health

## 2018-02-01 DIAGNOSIS — D649 Anemia, unspecified: Secondary | ICD-10-CM

## 2018-02-01 DIAGNOSIS — E1159 Type 2 diabetes mellitus with other circulatory complications: Secondary | ICD-10-CM

## 2018-02-01 DIAGNOSIS — Z95810 Presence of automatic (implantable) cardiac defibrillator: Secondary | ICD-10-CM

## 2018-02-01 LAB — GLUCOSE, CAPILLARY
GLUCOSE-CAPILLARY: 90 mg/dL (ref 65–99)
Glucose-Capillary: 132 mg/dL — ABNORMAL HIGH (ref 65–99)

## 2018-02-01 MED ORDER — POLYSACCHARIDE IRON COMPLEX 150 MG PO CAPS: 150.0000 mg | ORAL_CAPSULE | Freq: Every day | ORAL | 1 refills | Status: DC

## 2018-02-01 MED ORDER — ISOSORBIDE MONONITRATE ER 30 MG PO TB24: 30.0000 mg | ORAL_TABLET | Freq: Every day | ORAL | 2 refills | Status: DC

## 2018-02-01 MED ORDER — TICAGRELOR 90 MG PO TABS: 90.0000 mg | ORAL_TABLET | Freq: Two times a day (BID) | ORAL | 1 refills | Status: DC

## 2018-02-01 MED ORDER — NITROGLYCERIN 0.4 MG SL SUBL: 0.4000 mg | SUBLINGUAL_TABLET | SUBLINGUAL | 1 refills | Status: AC | PRN

## 2018-02-01 MED ORDER — AMIODARONE HCL 400 MG PO TABS: 400.0000 mg | ORAL_TABLET | Freq: Every day | ORAL | 1 refills | Status: DC

## 2018-02-01 MED ORDER — ATORVASTATIN CALCIUM 80 MG PO TABS: 80.0000 mg | ORAL_TABLET | Freq: Every day | ORAL | 1 refills | Status: DC

## 2018-02-01 NOTE — Progress Notes (Signed)
Physical Therapy Treatment Patient Details Name: Daryl Rodriguez MRN: [id number] DOB: [birth year] Today's Date: 02/01/2018    History of Present Illness 84 y.o. admitted with SOB and BLE edema for past 2 weeks with chest pain. Stent placement in RCA 5/20, 5/22 devolved v tach requiring cardioversion 5/23. PMH includes: HTN, DM2, CKD III, Peripheral vascular disease, Wrist surgery, back surgery.     PT Comments    Pt doing well with mobility. Eager to return home with family today.   Follow Up Recommendations  Home health PT;Supervision for mobility/OOB     Equipment Recommendations  None recommended by PT(Pt has rolling walker)    Recommendations for Other Services       Precautions / Restrictions Precautions Precautions: Fall;ICD/Pacemaker Precaution Comments: AICD placement 5/28 Restrictions Weight Bearing Restrictions: No    Mobility  Bed Mobility               General bed mobility comments: Pt up in chair  Transfers Overall transfer level: Needs assistance Equipment used: Rolling walker (2 wheeled) Transfers: Sit to/from Stand Sit to Stand: Supervision         General transfer comment: Supervision for safety  Ambulation/Gait Ambulation/Gait assistance: Supervision Ambulation Distance (Feet): 30 Feet Assistive device: Rolling walker (2 wheeled) Gait Pattern/deviations: Step-through pattern;Decreased stride length Gait velocity: decr Gait velocity interpretation: 1.31 - 2.62 ft/sec, indicative of limited community ambulator General Gait Details: supervision for safety   Stairs Stairs: Yes Stairs assistance: Min assist Stair Management: One rail Right;Step to pattern;Forwards Number of Stairs: 1     Wheelchair Mobility    Modified Rankin (Stroke Patients Only)       Balance Overall balance assessment: Needs assistance Sitting-balance support: No upper extremity supported;Feet supported Sitting balance-Leahy Scale: Good     Standing  balance support: No upper extremity supported;During functional activity Standing balance-Leahy Scale: Fair                              Cognition Arousal/Alertness: Awake/alert Behavior During Therapy: WFL for tasks assessed/performed Overall Cognitive Status: Within Functional Limits for tasks assessed                                        Exercises      General Comments        Pertinent Vitals/Pain Pain Assessment: No/denies pain    Home Living                      Prior Function            PT Goals (current goals can now be found in the care plan section) Progress towards PT goals: Progressing toward goals    Frequency    Min 3X/week      PT Plan Current plan remains appropriate    Co-evaluation              AM-PAC PT "6 Clicks" Daily Activity  Outcome Measure  Difficulty turning over in bed (including adjusting bedclothes, sheets and blankets)?: None Difficulty moving from lying on back to sitting on the side of the bed? : A Little Difficulty sitting down on and standing up from a chair with arms (e.g., wheelchair, bedside commode, etc,.)?: A Little Help needed moving to and from a bed to chair (including a wheelchair)?: A Little Help needed walking in  hospital room?: A Little Help needed climbing 3-5 steps with a railing? : A Little 6 Click Score: 19    End of Session   Activity Tolerance: Patient tolerated treatment well Patient left: in chair;with call bell/phone within reach;with family/visitor present   PT Visit Diagnosis: Unsteadiness on feet (R26.81);Other abnormalities of gait and mobility (R26.89)     Time: 2119-4174 PT Time Calculation (min) (ACUTE ONLY): 11 min  Charges:  $Gait Training: 8-22 mins                    G Codes:       [facility] PT [place] 02/01/2018, 4:04 PM

## 2018-02-01 NOTE — Discharge Summary (Addendum)
Discharge Summary    Patient ID: Daryl Rodriguez,  MRN: [id number], DOB/AGE: October 07, 1933 85 y.o.  Admit date: 01/22/2018 Discharge date: 02/01/2018  Primary Care Provider: Nicoletta Dress Primary Cardiologist: Dr. Claiborne Billings   Discharge Diagnoses    Active Problems:   ST elevation myocardial infarction involving right coronary artery [facility])   STEMI involving right coronary artery [facility])   Acute ST elevation myocardial infarction (STEMI) of inferior wall (HCC)   Respiratory distress   Acute pulmonary edema (HCC)   Acute respiratory failure (HCC)   V-tach (HCC)   Hypokalemia   Hypomagnesemia   V tach ([place])   Allergies No Known Allergies  Diagnostic Studies/Procedures    Cath: 01/22/18  Conclusion     Mid Cx lesion is 75% stenosed.  Prox LAD to Mid LAD lesion is 65% stenosed.  Dist RCA lesion is 20% stenosed.  Mid RCA lesion is 30% stenosed.  Prox RCA to Mid RCA lesion is 100% stenosed.  Post intervention, there is a 0% residual stenosis.  A stent was successfully placed.  Acute ST segment elevation myocardial infarction secondary to total occlusion of a calcified mid right coronary artery with thrombus formation with initial TIMI 0 flow.  Coronary calcification with multivessel CAD and additional 60-70% calcified stenosis of the mid LAD and 75% stenoses of the left circumflex vessel.  Difficult but successful percutaneous coronary intervention to the totally occluded calcified mid RCA with PTCA, and ultimate insertion of a 3.024 mm Synergy DES stent postdilated to 3.25 mm with the 100% occlusion being reduced to 0% and restoration of brisk TIMI-3 flow.  RECOMMENDATION: Dual antiplatelet therapy for minimum of 1 year and probably indefinitely. Continue [place] for 18 hours post procedure. Initial medical therapy for concomitant CAD. Due to continued hypoxemia, the patient may benefit from BiPAP therapy in the CCU with continued IV diuresis. A 2-D echo  Doppler study will be scheduled to assess LV function and valvular architecture. High potency statin therapy, and guideline directed optimal post MI treatment.   Cath: 01/25/18  Conclusion     Prox LAD to Mid LAD lesion is 70% stenosed.  Mid Cx lesion is 75% stenosed.  Dist RCA lesion is 40% stenosed.  Mid RCA lesion is 40% stenosed.  Previously placed Prox RCA to Mid RCA stent (unknown type) is widely patent.  1. Widely patent RCA stent with no significant restenosis. Unchanged borderline significant calcified disease affecting the LAD and left circumflex. 2. Moderately elevated left ventricular end-diastolic pressure. Left ventricular angiography was not performed. 3. Difficult procedure via the right radial artery due to significant tortuosity affecting the right subclavian and innominate arteries.  Recommendations: Continue medical therapy. Continue treatment for ventricular tachycardia. Not able to hydrate the patient given elevated left ventricular end-diastolic pressure. Only 40 mL of contrast was used. Avoid aggressive diuresis at least in the next 24 hours. The patient converted from atrial fibrillation to sinus rhythm during the procedure.   TTE: 01/23/18  Study Conclusions  - Left ventricle: The cavity size was mildly dilated. Systolic function was mildly to moderately reduced. The estimated ejection fraction was in the range of 40% to 45%. Diffuse hypokinesis. Doppler parameters are consistent with abnormal left ventricular relaxation (grade 1 diastolic dysfunction). - Mitral valve: There was mild regurgitation. - Left atrium: The atrium was mildly dilated. - Right ventricle: The cavity size was moderately dilated. Wall thickness was normal. - Tricuspid valve: There was mild regurgitation. - Pulmonary arteries: Systolic pressure was mildly increased. PA  peak pressure: 35 mm Hg (S). ____________   History of Present Illness       Mr. Daryl Rodriguez is a 84 year old Caucasian male with past medical history of obesity, hypertension, hyperlipidemia and DM 2.  Patient denied any prior cardiac history.  His father had MI in his 53s.  He quit smoking roughly 50 years ago.  He takes amlodipine, clonidine, glimepiride, lisinopril, meloxicam and metoprolol at home.  Patient was in his usual state of health until he started having indigestion feeling for the past 4 days prior to admission.  He was at his PCPs office and was given a dose of nitroglycerin which improved his chest discomfort.  EMS was called and patient was transported to [facility] for further evaluation.  En route, he had recurrent chest discomfort and was given another nitroglycerin and 324 mg aspirin.  Initial EKG showed a nonspecific changes including ST elevation in lead III and potential ST depression in anterolateral leads.  EKG strip obtained by EMS at 1607 also showed persistent ST elevation in lead III and possibly aVF as well.  On arrival to [facility] ED, he continued to have persistent chest discomfort.  He was taken urgently to Cath Lab as a STEMI.   Hospital Course     Consultants: EP, PCCM, Nephrology. Urology   Underwent cardiac cath noted above with Dr. Claiborne Billings showing total occlusion of the mRCA with thrombus formation. This was treated with PCI/DES x1. Also noted 60-70% mLAD and 75% stenosis in the Lcx. Plan for DAPT with ASA/Brilinta for at least one year. He was noted to have ongoing hypoxia while in the cath lab. PCCM was consulted to assist with Bipap therapy and transferred to the ICU. Troponin peaked at 3.19. He had an episode of vomiting and was removed from the Bipap and placed on high flow [place]. The day following his cardiac cath he developed Afib RVR and placed on IV heparin, along with Cardizem. Rate remained uncontrolled and BB therapy was added. He then developed VT/SVT with abd pain. IV amiodarone was started with a bolus. EP was  consulted and seen by Dr. Lovena Le. Plan was to continue with IV amiodarone and to add lidocaine if recurrent VT. Echo showed EF of 40-45% with diffuse hypokinesis. On 5/22 he developed sustained VT with a rate of 230 bpm requiring cardioversion. Had recurrent VT with need for 2nd cardioversion about 20 minutes later. Lidocaine bolus was given and started on a gtt. Diltiazem was stopped. Noted to have crackles and given IV lasix. Given his recurrent VT he was taken back to the cath lab for a relook cath with Dr. Fletcher Anon which showed without acute change. He was able to wean his O2 from 7L to 3L. Cr did peak at 1.89 with diuresis. Seen by nephrology who felt this was likely related to contrast, diuresis and hypoperfusion/STEMI. His lidocaine gtt was stopped on 5/24. Plan was to place for [place]. Had recurrence of Afib/Aflutter and remained on amiodarone gtt. Developed a 2g drop in his Hgb and IV heparin was stopped. Decision was made to defer [place]. She was started on Iron supplement. He developed decreased UOP and urinary retention. Urology was consulted and seen by Dr. Tresa Moore. Placed on alpha blockers which were originally held on admission. He was transitioned to telemetry on 5/26. On 5/27 patient collapsed and found to be in unstable VT. He was defibrillated x1 with return of NSR. Transferred back to the ICU. Decision was made at that time to  place ICD. Underwent ICD placement on 5/28 with ICD stable and device functioning normal the following day. Given his anemia and DAPT decision was made to continue and keep off [place] at this time. Will plan for ASA/Brilinta for one month, then switch to plavix if cost is and issue or anticoagulation is needed. PT/OT recommended HHPT/OT and rolling walker at the time of discharge. He was seen by EP and determined stable for discharge on 5/29 if rhythm remained stable. Transitioned to amiodarone 400mg  daily with no recurrent VT noted on telemetry. Restrictions regarding ICD and  driving were discussed with the patient prior to discharge by EP.    Kathryn Linarez was seen by Dr. Irish Lack and determined stable for discharge home. Follow up in the office has been arranged. Medications are listed below.   _____________  Discharge Vitals Blood pressure (!) 107/51, pulse 68, temperature 98.8 F (37.1 C), temperature source Oral, resp. rate 19, height 5\' 9"  (1.753 m), weight 217 lb 9.5 oz (98.7 kg), SpO2 96 %.  Filed Weights   01/29/18 0407 01/31/18 0735 02/01/18 0454  Weight: 208 lb 11.2 oz (94.7 kg) 212 lb 8.4 oz (96.4 kg) 217 lb 9.5 oz (98.7 kg)    Labs & Radiologic Studies    CBC Recent Labs    01/30/18 0334  WBC 11.9*  HGB 8.2*  HCT 25.8*  MCV 96.6  PLT 353   Basic Metabolic Panel Recent Labs    01/30/18 0334 01/31/18 0242  NA 137 136  K 3.9 4.2  CL 105 104  CO2 24 24  GLUCOSE 151* 178*  BUN 20 23*  CREATININE 1.13 1.25*  CALCIUM 8.3* 8.1*  MG 2.0  --   PHOS 2.6 3.1   Liver Function Tests Recent Labs    01/30/18 0334 01/31/18 0242  ALBUMIN 2.4* 2.2*   No results for input(s): LIPASE, AMYLASE in the last 72 hours. Cardiac Enzymes No results for input(s): CKTOTAL, CKMB, CKMBINDEX, TROPONINI in the last 72 hours. BNP Invalid input(s): POCBNP D-Dimer No results for input(s): DDIMER in the last 72 hours. Hemoglobin A1C No results for input(s): HGBA1C in the last 72 hours. Fasting Lipid Panel No results for input(s): CHOL, HDL, LDLCALC, TRIG, CHOLHDL, LDLDIRECT in the last 72 hours. Thyroid Function Tests No results for input(s): TSH, T4TOTAL, T3FREE, THYROIDAB in the last 72 hours.  Invalid input(s): FREET3 _____________  Dg Chest 2 View  Result Date: 01/31/2018 CLINICAL DATA:  Status post defibrillator placement. EXAM: CHEST - 2 VIEW COMPARISON:  Radiograph of Jan 26, 2018. FINDINGS: Stable cardiomediastinal silhouette. Atherosclerosis of thoracic aorta is noted. Single lead left-sided pacemaker is noted with lead in grossly good  position. No pneumothorax is noted. No significant pleural effusion is noted. Minimal bibasilar subsegmental atelectasis is noted. IMPRESSION: Single lead left-sided pacemaker in grossly good position. No pneumothorax is noted. Minimal bibasilar subsegmental atelectasis. Aortic Atherosclerosis (ICD10-I70.0). Electronically Signed   By: Marijo Conception, M.D.   On: 01/31/2018 07:24   US Renal  Result Date: 01/25/2018 CLINICAL DATA:  Acute on chronic renal failure EXAM: RENAL / URINARY TRACT ULTRASOUND COMPLETE COMPARISON:  CT 04/19/2011 FINDINGS: Right Kidney: Length: 9.9 cm. Echogenicity within normal limits. No mass or hydronephrosis visualized. Left Kidney: Length: 10.4 cm. Echogenicity within normal limits. No mass or hydronephrosis visualized. Bladder: Not well seen, probably empty. IMPRESSION: Ultrasound appearance of the kidneys is within normal limits Electronically Signed   By: Donavan Foil M.D.   On: 01/25/2018 23:22   Dg Chest [facility]  1 View  Result Date: 01/26/2018 CLINICAL DATA:  Shortness of breath EXAM: PORTABLE CHEST 1 VIEW COMPARISON:  01/24/2018 FINDINGS: Cardiac shadow is stable. Aortic calcifications are again seen. Increasing vascular congestion and interstitial edema is noted consistent with worsening CHF. No focal confluent infiltrate is seen. No bony abnormality is noted. IMPRESSION: Worsening CHF. Electronically Signed   By: Inez Catalina M.D.   On: 01/26/2018 07:39   Dg Chest Port 1 View  Result Date: 01/24/2018 CLINICAL DATA:  Acute respiratory failure with hypoxia and dyspnea EXAM: PORTABLE CHEST 1 VIEW COMPARISON:  01/22/2018 chest radiograph. FINDINGS: Stable cardiomediastinal silhouette with mild cardiomegaly. No pneumothorax. No right pleural effusion. Small left pleural effusion appears increased. Mild hazy and linear parahilar lung opacities, improved in the interval. Patchy left lung base opacity with decreased lung volumes. IMPRESSION: 1. Mild congestive heart failure,  improved. 2. Small left pleural effusion, increased. 3. Decreased lung volumes with patchy left lung base opacity, favor atelectasis. Electronically Signed   By: Ilona Sorrel M.D.   On: 01/24/2018 10:10   Dg Chest Port 1 View  Result Date: 01/22/2018 CLINICAL DATA:  Respiratory distress EXAM: PORTABLE CHEST 1 VIEW COMPARISON:  Chest x-ray 08/07/2015 FINDINGS: Cardiomegaly with vascular congestion. Multifocal perihilar airspace disease. Probable tiny effusions. Aortic atherosclerosis. No pneumothorax. IMPRESSION: Cardiomegaly with vascular congestion and tiny pleural effusions. Multifocal perihilar airspace disease may reflect pulmonary edema versus bilateral pneumonia. Electronically Signed   By: Donavan Foil M.D.   On: 01/22/2018 20:19   Dg Abd Portable 1v  Result Date: 01/24/2018 CLINICAL DATA:  Check nasogastric catheter placement EXAM: PORTABLE ABDOMEN - 1 VIEW COMPARISON:  01/24/2018 FINDINGS: Nasogastric catheter is now noted coiled within the stomach. The stomach is decompressed when compared with the previous exam. Postsurgical changes are again noted. IMPRESSION: Nasogastric catheter within the stomach. Electronically Signed   By: Inez Catalina M.D.   On: 01/24/2018 11:52   Dg Abd Portable 1v  Result Date: 01/24/2018 CLINICAL DATA:  Abdominal pain and distention EXAM: PORTABLE ABDOMEN - 1 VIEW COMPARISON:  04/19/2011 CT abdomen/pelvis FINDINGS: Bilateral lumbar spinal fusion hardware overlies the lower lumbar spine. There is moderate gaseous distention of the stomach. No dilated small bowel loops. Mild stool and gas in the large bowel. No evidence of pneumatosis or pneumoperitoneum. Mild hazy left lung base opacity. No radiopaque nephrolithiasis. IMPRESSION: Nonspecific moderate gaseous distention of the stomach. No evidence of small or large bowel obstruction. Mild hazy left lung base opacity, please see the separate concurrent chest radiograph report for details. Electronically Signed   By:  Ilona Sorrel M.D.   On: 01/24/2018 10:08   Disposition   Pt is being discharged home today in good condition.  Follow-up Plans & Appointments    Follow-up Information    [facility] Follow up on 02/12/2018.   Specialty:  Cardiology Why:  11:30AM, wound check visit Contact information: [address], [address] [place]       Evans Lance, MD Follow up on 05/04/2018.   Specialty:  Cardiology Why:  3:15PM Contact information: [address] [place] [address] [phone]        Lendon Colonel, NP Follow up on 02/12/2018.   Specialties:  Nurse Practitioner, Radiology, Cardiology Why:  at 9:30am for your follow up appt.  Contact information: [address] [place] [address] [phone]        [facility] Follow up on  02/08/2018.   Specialty:  Cardiology Why:  please come in for follow up labs.  Contact information: [address] [place] [phone]         Discharge Instructions    Amb Referral to Cardiac Rehabilitation   Complete by:  As directed    Diagnosis:   Coronary Stents PTCA STEMI     Call MD for:  redness, tenderness, or signs of infection (pain, swelling, redness, odor or green/yellow discharge around incision site)   Complete by:  As directed    Diet - low sodium heart healthy   Complete by:  As directed    Discharge instructions   Complete by:  As directed    Radial Site Care Refer to this sheet in the next few weeks. These instructions provide you with information on caring for yourself after your procedure. Your caregiver may also give you more specific instructions. Your treatment has been planned according to current medical practices, but problems sometimes occur. Call your caregiver if you have any problems or questions after your procedure. HOME CARE INSTRUCTIONS You may shower  the day after the procedure.Remove the bandage (dressing) and gently wash the site with plain soap and water.Gently pat the site dry.  Do not apply powder or lotion to the site.  Do not submerge the affected site in water for 3 to 5 days.  Inspect the site at least twice daily.  Do not flex or bend the affected arm for 24 hours.  No lifting over 5 pounds (2.3 kg) for 5 days after your procedure.  Do not drive home if you are discharged the same day of the procedure. Have someone else drive you.  You may drive 24 hours after the procedure unless otherwise instructed by your caregiver.  What to expect: Any bruising will usually fade within 1 to 2 weeks.  Blood that collects in the tissue (hematoma) may be painful to the touch. It should usually decrease in size and tenderness within 1 to 2 weeks.  SEEK IMMEDIATE MEDICAL CARE IF: You have unusual pain at the radial site.  You have redness, warmth, swelling, or pain at the radial site.  You have drainage (other than a small amount of blood on the dressing).  You have chills.  You have a fever or persistent symptoms for more than 72 hours.  You have a fever and your symptoms suddenly get worse.  Your arm becomes pale, cool, tingly, or numb.  You have heavy bleeding from the site. Hold pressure on the site.   PLEASE DO NOT MISS ANY DOSES OF YOUR BRILINTA!!!!! Also keep a log of you blood pressures and bring back to your follow up appt. Please call the office with any questions.   Patients taking blood thinners should generally stay away from medicines like ibuprofen, Advil, Motrin, naproxen, and Aleve due to risk of stomach bleeding. You may take Tylenol as directed or talk to your primary doctor about alternatives.   Increase activity slowly   Complete by:  As directed       Discharge Medications     Medication List    STOP taking these medications   amLODipine 10 MG tablet Commonly known as:  NORVASC   cloNIDine 0.2 MG  tablet Commonly known as:  CATAPRES   meloxicam 15 MG tablet Commonly known as:  MOBIC   simvastatin 20 MG tablet Commonly known as:  ZOCOR   spironolactone 25 MG tablet Commonly known as:  ALDACTONE     TAKE these medications   amiodarone 400 MG tablet Commonly known as:  PACERONE Take 1 tablet (400 mg total) by mouth daily. Start taking on:  02/02/2018   ammonium lactate 12 % lotion Commonly known as:  LAC-HYDRIN Apply 1 application topically 2 (two) times daily. Arms legs and hands   aspirin EC 81 MG tablet Take 81 mg by mouth daily.   atorvastatin 80 MG tablet Commonly known as:  LIPITOR Take 1 tablet (80 mg total) by mouth daily at 6 PM.   gabapentin 300 MG capsule Commonly known as:  NEURONTIN Take 1 capsule by mouth 2 (two) times daily.   glimepiride 4 MG tablet Commonly known as:  AMARYL Take 4 mg by mouth daily.   iron polysaccharides 150 MG capsule Commonly known as:  NIFEREX Take 1 capsule (150 mg total) by mouth daily. Start taking on:  02/02/2018   isosorbide mononitrate 30 MG 24 hr tablet Commonly known as:  IMDUR Take 1 tablet (30 mg total) by mouth daily.   latanoprost 0.005 % ophthalmic solution Commonly known as:  XALATAN Place 1 drop into both eyes at bedtime.   metoprolol tartrate 50 MG tablet Commonly known as:  LOPRESSOR Take 50 mg by mouth 2 (two) times daily.   nitroGLYCERIN 0.4 MG SL tablet Commonly known as:  NITROSTAT Place 1 tablet (0.4 mg total) under the tongue every 5 (five) minutes x 3 doses as needed for chest pain.   terazosin 1 MG capsule Commonly known as:  HYTRIN Take 1 mg by mouth daily.   ticagrelor 90 MG Tabs tablet Commonly known as:  BRILINTA Take 1 tablet (90 mg total) by mouth 2 (two) times daily.   triamcinolone cream 0.1 % Commonly known as:  KENALOG Apply 1 application topically as needed (Itchy skin).   VITAMIN B 12 PO Take 1 tablet by mouth daily.   Vitamin D (Ergocalciferol) 50000 units Caps  capsule Commonly known as:  DRISDOL Take 50,000 Units by mouth once a week. Thursdays        Aspirin prescribed at discharge?  Yes High Intensity Statin Prescribed? (Lipitor 40-80mg  or Crestor 20-40mg ): Yes Beta Blocker Prescribed? Yes For EF <40%, was ACEI/ARB Prescribed? No: EF ok ADP Receptor Inhibitor Prescribed? (i.e. Plavix etc.-Includes Medically Managed Patients): Yes For EF <40%, Aldosterone Inhibitor Prescribed? No: EF ok Was EF assessed during THIS hospitalization? Yes Was Cardiac Rehab II ordered? (Included Medically managed Patients): Yes   Outstanding Labs/Studies   FLP/LFTs in 6 weeks. CBC in a week.   Duration of Discharge Encounter   Greater than 30 minutes including physician time.  Signed, Reino Bellis NP-C 02/01/2018, 1:03 PM    I have examined the patient and reviewed assessment and plan and discussed with patient.  Agree with above as stated.  Will plan for DAPT for at least a month.  If there is more AFib, would consider stopping aspirin and adding anticoagulation in 30 days.  Fresh AICD pocket, so would hold off on anticoagulation at this time. .  In 30 days, would switch Brilinta to Plavix, by giving a 300 mg loading dose.  Brilinta will be very expensive for him.  COuld consider Plavix + ELiquis, without aspirin, at that point if he continues to have more AFib.     Larae Grooms

## 2018-02-01 NOTE — Progress Notes (Addendum)
Progress Note  Patient Name: Daryl Rodriguez Date of Encounter: 02/01/2018  Primary Cardiologist: Shelva Majestic, MD  Subjective   Feeling well this morning.   Inpatient Medications    Scheduled Meds: . amiodarone  400 mg Oral Daily  . aspirin EC  81 mg Oral Daily  . atorvastatin  80 mg Oral q1800  . glimepiride  4 mg Oral Q breakfast  . insulin aspart  0-5 Units Subcutaneous QHS  . insulin aspart  0-9 Units Subcutaneous TID WC  . insulin glargine  10 Units Subcutaneous QHS  . iron polysaccharides  150 mg Oral Daily  . isosorbide mononitrate  30 mg Oral Daily  . latanoprost  1 drop Both Eyes QHS  . metoprolol tartrate  50 mg Oral BID  . pantoprazole  40 mg Oral BID  . terazosin  1 mg Oral Daily  . ticagrelor  90 mg Oral BID   Continuous Infusions: . sodium chloride Stopped (01/23/18 1400)   PRN Meds: sodium chloride, acetaminophen, alum & mag hydroxide-simeth, loperamide, nitroGLYCERIN, ondansetron (ZOFRAN) IV, prochlorperazine, senna-docusate, sodium chloride flush, zolpidem   Vital Signs    Vitals:   01/31/18 2000 01/31/18 2041 02/01/18 0454 02/01/18 0843  BP:  (!) 143/62 (!) 130/58 (!) 118/51  Pulse:  84  80  Resp: (!) 21   (!) 21  Temp:  99 F (37.2 C) 98.6 F (37 C) 98.9 F (37.2 C)  TempSrc:  Oral Oral Oral  SpO2:  97%  96%  Weight:   217 lb 9.5 oz (98.7 kg)   Height:        Intake/Output Summary (Last 24 hours) at 02/01/2018 0905 Last data filed at 02/01/2018 0844 Gross per 24 hour  Intake 960 ml  Output 575 ml  Net 385 ml   Filed Weights   01/29/18 0407 01/31/18 0735 02/01/18 0454  Weight: 208 lb 11.2 oz (94.7 kg) 212 lb 8.4 oz (96.4 kg) 217 lb 9.5 oz (98.7 kg)    Telemetry    SR with episode of tachycardia (possible PAF) - Personally Reviewed  Physical Exam   General: Well developed, well nourished, male appearing in no acute distress. Head: Normocephalic, atraumatic.  Neck: Supple without bruits, JVD. Lungs:  Resp regular and unlabored,  CTA. Left chest ICD site dry with no bleeding.  Heart: RRR, S1, S2, nomurmur; no rub. Abdomen: Soft, non-tender, non-distended with normoactive bowel sounds.  Extremities: No clubbing, cyanosis, edema. Distal pedal pulses are 2+ bilaterally. Neuro: Alert and oriented X 3. Moves all extremities spontaneously. Psych: Normal affect.  Labs    Chemistry Recent Labs  Lab 01/29/18 0352 01/30/18 0334 01/31/18 0242  NA 136 137 136  K 3.8 3.9 4.2  CL 106 105 104  CO2 23 24 24   GLUCOSE 108* 151* 178*  BUN 22* 20 23*  CREATININE 1.15 1.13 1.25*  CALCIUM 8.3* 8.3* 8.1*  ALBUMIN 2.4* 2.4* 2.2*  GFRNONAA 58* 59* 52*  GFRAA >60 >60 >60  ANIONGAP 7 8 8      Hematology Recent Labs  Lab 01/28/18 0426 01/29/18 0352 01/30/18 0334  WBC 10.4 9.5 11.9*  RBC 2.64* 2.60* 2.67*  HGB 8.3* 8.0* 8.2*  HCT 25.3* 25.2* 25.8*  MCV 95.8 96.9 96.6  MCH 31.4 30.8 30.7  MCHC 32.8 31.7 31.8  RDW 14.5 14.8 14.9  PLT 288 286 312    Cardiac EnzymesNo results for input(s): TROPONINI in the last 168 hours. No results for input(s): TROPIPOC in the last 168 hours.   BNPNo  results for input(s): BNP, PROBNP in the last 168 hours.   DDimer No results for input(s): DDIMER in the last 168 hours.    Radiology    Dg Chest 2 View  Result Date: 01/31/2018 CLINICAL DATA:  Status post defibrillator placement. EXAM: CHEST - 2 VIEW COMPARISON:  Radiograph of Jan 26, 2018. FINDINGS: Stable cardiomediastinal silhouette. Atherosclerosis of thoracic aorta is noted. Single lead left-sided pacemaker is noted with lead in grossly good position. No pneumothorax is noted. No significant pleural effusion is noted. Minimal bibasilar subsegmental atelectasis is noted. IMPRESSION: Single lead left-sided pacemaker in grossly good position. No pneumothorax is noted. Minimal bibasilar subsegmental atelectasis. Aortic Atherosclerosis (ICD10-I70.0). Electronically Signed   By: Marijo Conception, M.D.   On: 01/31/2018 07:24    Cardiac  Studies   Cath: 01/22/18  Conclusion     Mid Cx lesion is 75% stenosed.  Prox LAD to Mid LAD lesion is 65% stenosed.  Dist RCA lesion is 20% stenosed.  Mid RCA lesion is 30% stenosed.  Prox RCA to Mid RCA lesion is 100% stenosed.  Post intervention, there is a 0% residual stenosis.  A stent was successfully placed.   Acute ST segment elevation myocardial infarction secondary to total occlusion of a calcified mid right coronary artery with thrombus formation with initial TIMI 0 flow.  Coronary calcification with multivessel CAD and additional 60-70% calcified stenosis of the mid LAD and 75% stenoses of the left circumflex vessel.  Difficult but successful percutaneous coronary intervention to the totally occluded calcified mid RCA with PTCA, and ultimate insertion of a 3.024 mm Synergy DES stent postdilated to 3.25 mm with the 100% occlusion being reduced to 0% and restoration of brisk TIMI-3 flow.  RECOMMENDATION: Dual antiplatelet therapy for minimum of 1 year and probably indefinitely.  Continue Aggrastat for 18 hours post procedure.  Initial medical therapy for concomitant CAD.  Due to continued hypoxemia, the patient may benefit from BiPAP therapy in the CCU with continued IV diuresis.  A 2-D echo Doppler study will be scheduled to assess LV function and valvular architecture.  High potency statin therapy, and guideline directed optimal post MI treatment.   Cath: 01/25/18  Conclusion     Prox LAD to Mid LAD lesion is 70% stenosed.  Mid Cx lesion is 75% stenosed.  Dist RCA lesion is 40% stenosed.  Mid RCA lesion is 40% stenosed.  Previously placed Prox RCA to Mid RCA stent (unknown type) is widely patent.   1.  Widely patent RCA stent with no significant restenosis.  Unchanged borderline significant calcified disease affecting the LAD and left circumflex. 2.  Moderately elevated left ventricular end-diastolic pressure.  Left ventricular angiography was not  performed. 3.  Difficult procedure via the right radial artery due to significant tortuosity affecting the right subclavian and innominate arteries.  Recommendations: Continue medical therapy. Continue treatment for ventricular tachycardia. Not able to hydrate the patient given elevated left ventricular end-diastolic pressure.  Only 40 mL of contrast was used.  Avoid aggressive diuresis at least in the next 24 hours. The patient converted from atrial fibrillation to sinus rhythm during the procedure.   TTE: 01/23/18  Study Conclusions  - Left ventricle: The cavity size was mildly dilated. Systolic   function was mildly to moderately reduced. The estimated ejection   fraction was in the range of 40% to 45%. Diffuse hypokinesis.   Doppler parameters are consistent with abnormal left ventricular   relaxation (grade 1 diastolic dysfunction). - Mitral valve: There  was mild regurgitation. - Left atrium: The atrium was mildly dilated. - Right ventricle: The cavity size was moderately dilated. Wall   thickness was normal. - Tricuspid valve: There was mild regurgitation. - Pulmonary arteries: Systolic pressure was mildly increased. PA   peak pressure: 35 mm Hg (S).  Patient Profile     84 y.o. male with inferior STEMI s/p PCI complicated by VF and PAF. Now s/p ICD placement.   Assessment & Plan    1. Acute inferior MI: Underwent PCI/DES x1 mRCA. Placed on DAPT with ASA/Brilinta. Troponin 3.19. Echo this admission showed EF of 40-45% diffuse hypokinesis and G1DD.   2. VT arrest 01/29/18: s/p ICD placement with Dr. Lovena Le on 01/30/18. Device site/check stable per EP. CXR yesterday without pneumothorax. Transitioned from IV amio to 400mg  po daily.  -- instructions given regarding ICD site care via EP -- no driving for at least 6 months.   3. PAF: ChadsVasc of 6. Currently on DAPT with ASA/Brilinta. Plan was to avoid triple therapy if possible. Did have an episode on telemetry this morning  concerning for recurrent Afib. Will review with MD to determine medication plan moving forward.   4. AKI: Cr stable at 1.25  Dispo: seen by PT/OT with plans for HHPT and rolling walker at discharge.   Signed, Reino Bellis, NP  02/01/2018, 9:05 AM  Pager # [phone]   I have examined the patient and reviewed assessment and plan and discussed with patient.  Agree with above as stated.  D/w EP.  Avoid anticoagulation at this time.  In one month, can change to Plavix.  Brilinta for the first 30 days.  Could consider Eliquis/Plavix combination going forward after 30 days without aspirin.  Plan discharge today.  Larae Grooms   For questions or updates, please contact [facility] Please consult [email or web address] for contact info under Cardiology/STEMI.

## 2018-02-01 NOTE — Care Management Note (Signed)
Case Management Note  Patient Details  Name: Daryl Rodriguez MRN: [id number] Date of Birth: [birth year]  Subjective/Objective:                    Action/Plan: Pt discharging home with orders for [facility] services. Pt had previously requested [facility]. Butch Penny with [facility] notified of referral.  Pt with orders for rollator. Butch Penny with [facility] aware and will have delivered to the room. Pt has transportation home.    Expected Discharge Date:  02/01/18               Expected Discharge Plan:  [place]  In-House Referral:     Discharge planning Services  CM Consult, Medication Assistance  Post Acute Care Choice:  Home Health, Durable Medical Equipment Choice offered to:  Patient  DME Arranged:  Walker rolling with seat DME Agency:  [place]:  PT, OT [facility] Agency:  [place]  Status of Service:  Completed, signed off  If discussed at [place] of Stay Meetings, dates discussed:    Additional Comments:  Pollie Friar, RN 02/01/2018, 2:27 PM

## 2018-02-01 NOTE — Progress Notes (Signed)
Discharge instructions reviewed with patient and patient's daughter at this time. Patient verbalized understanding. All questions answered.   Emelda Fear, RN

## 2018-02-01 NOTE — Progress Notes (Signed)
CARDIAC REHAB PHASE I   PRE:  Rate/Rhythm: 80 SR  BP:  Supine: 118/51  Sitting:   Standing:    SaO2: 92%RA  MODE:  Ambulation: 120 ft   POST:  Rate/Rhythm: 96 SR  BP:  Supine:   Sitting: 139/62  Standing:    SaO2: 99%RA 0842-0920 Pt walked 120 ft on RA with rolling walker and very little assistance. Would recommend rolling walker for home use. Cardiology ordering it. Reviewed NTG use, heart healthy food choices (gave diabetic and heart healthy diets), ex ed. Referring to [place] CRP 2. Reinforced importance of briliinta with stent. To recliner after walk. Daughter in room.   Graylon Good, RN BSN  02/01/2018 9:17 AM

## 2018-02-01 NOTE — Progress Notes (Signed)
Occupational Therapy Treatment Patient Details Name: Daryl Rodriguez MRN: [id number] DOB: [birth year] Today's Date: 02/01/2018    History of present illness 84 y.o. admitted with SOB and BLE edema for past 2 weeks with chest pain. Stent placement in RCA 5/20, 5/22 devolved v tach requiring cardioversion 5/23. PMH includes: HTN, DM2, CKD III, Peripheral vascular disease, Wrist surgery, back surgery.    OT comments  Focus of session on review of pacemaker precautions during ADL. Pt able to maintain precautions throughout functional mobility and grooming task in standing. Reviewed UB/LB bathing and dressing technique; daughter present and verbalizes understanding of information along with pt. D/c plan remains appropriate. Will continue to follow acutely.   Follow Up Recommendations  Home health OT;Supervision/Assistance - 24 hour    Equipment Recommendations  None recommended by OT    Recommendations for Other Services      Precautions / Restrictions Precautions Precautions: Fall;ICD/Pacemaker Precaution Comments: AICD placement 5/28 Restrictions Weight Bearing Restrictions: No       Mobility Bed Mobility               General bed mobility comments: Pt OOB in chair upon arrival   Transfers Overall transfer level: Needs assistance Equipment used: Rolling walker (2 wheeled) Transfers: Sit to/from Stand Sit to Stand: Supervision         General transfer comment: Supervision for safety, uses L arm to steady but no weight bearing during transfer    Balance Overall balance assessment: Needs assistance Sitting-balance support: No upper extremity supported;Feet supported Sitting balance-Leahy Scale: Good     Standing balance support: No upper extremity supported;During functional activity Standing balance-Leahy Scale: Fair                             ADL either performed or assessed with clinical judgement   ADL Overall ADL's : Needs assistance/impaired      Grooming: Supervision/safety;Oral care;Wash/dry face;Standing             Upper Body Dressing Details (indicate cue type and reason): Reviewed UB dressing technique with pt and daughter   Lower Body Dressing Details (indicate cue type and reason): Educated on need for assist with LB clothing as well due to precautions; daughter reports someone will be available to assist Toilet Transfer: Supervision/safety;Ambulation;RW Toilet Transfer Details (indicate cue type and reason): Simulated by sit to stand with functional mobility in room         Functional mobility during ADLs: Supervision/safety;Rolling walker General ADL Comments: Discussed bathing strategies since he is unable to get incision site wet for 1 week, educated on energy conservation strategies.     Vision       Perception     Praxis      Cognition Arousal/Alertness: Awake/alert Behavior During Therapy: WFL for tasks assessed/performed Overall Cognitive Status: Within Functional Limits for tasks assessed                                          Exercises     Shoulder Instructions       General Comments      Pertinent Vitals/ Pain       Pain Assessment: Faces Faces Pain Scale: Hurts a little bit Pain Location: R knee Pain Descriptors / Indicators: Discomfort Pain Intervention(s): Monitored during session  Home Living  Prior Functioning/Environment              Frequency  Min 2X/week        Progress Toward Goals  OT Goals(current goals can now be found in the care plan section)  Progress towards OT goals: Progressing toward goals  Acute Rehab OT Goals Patient Stated Goal: home today OT Goal Formulation: With patient/family  Plan Discharge plan remains appropriate    Co-evaluation                 AM-PAC PT "6 Clicks" Daily Activity     Outcome Measure   Help from another person eating meals?:  None Help from another person taking care of personal grooming?: A Little Help from another person toileting, which includes using toliet, bedpan, or urinal?: A Little Help from another person bathing (including washing, rinsing, drying)?: A Little Help from another person to put on and taking off regular upper body clothing?: A Little Help from another person to put on and taking off regular lower body clothing?: A Lot 6 Click Score: 18    End of Session Equipment Utilized During Treatment: Rolling walker  OT Visit Diagnosis: Unsteadiness on feet (R26.81)   Activity Tolerance Patient tolerated treatment well   Patient Left in chair;with call bell/phone within reach;with family/visitor present   Nurse Communication          Time: 3009-2330 OT Time Calculation (min): 15 min  Charges: OT General Charges $OT Visit: 1 Visit OT Treatments $Self Care/Home Management : 8-22 mins  Daryl Rodriguez A. Ulice Brilliant, M.S., OTR/L Acute Rehab Department: [phone]    Binnie Kand 02/01/2018, 10:10 AM

## 2018-02-01 NOTE — Telephone Encounter (Signed)
TOC Pateint- Please call Patient- Pt has an appointment with Jory Sims on 02-12-18.

## 2018-02-01 NOTE — Progress Notes (Signed)
IV's and telemetry discontinued at this time. CCMD notified.  

## 2018-02-01 NOTE — Telephone Encounter (Signed)
Patient discharged 02/01/18 - first [facility] outreach should be 02/02/18

## 2018-02-02 NOTE — Telephone Encounter (Signed)
Patient contacted regarding discharge from [facility] 01/22/2018 - 02/01/2018 (10 days)  Patient understands to follow up with provider Jory Sims on 02-12-18. at [employer].. Patient understands discharge instructions? YES Patient understands medications and regiment? YES Patient understands to bring all medications to this visit? YES  DAUGHTER(PT STATES OK TO SPEAK WITH HER) SHE DOES HAVE A QUESTION ABOUT LISINOPRIL AND WEATHER HE SHOULD TAKE IT OR NOT AS IT IS NOT ON D/C MEDICATION LIST. IT APPEARS THAT LISINOPRIL WAS D/C 01-28-18 WHILE PT IS IN THE HOSP BUT NOT INCLUDED ON D/C LIST. SHE WILL HOLD MED UNTIL APPT AND DISCUSS WITH DR Theador Hawthorne

## 2018-02-06 DIAGNOSIS — I4891 Unspecified atrial fibrillation: Secondary | ICD-10-CM | POA: Diagnosis not present

## 2018-02-06 DIAGNOSIS — N183 Chronic kidney disease, stage 3 (moderate): Secondary | ICD-10-CM | POA: Diagnosis not present

## 2018-02-06 DIAGNOSIS — Z87891 Personal history of nicotine dependence: Secondary | ICD-10-CM | POA: Diagnosis not present

## 2018-02-06 DIAGNOSIS — I2119 ST elevation (STEMI) myocardial infarction involving other coronary artery of inferior wall: Secondary | ICD-10-CM | POA: Diagnosis not present

## 2018-02-06 DIAGNOSIS — I129 Hypertensive chronic kidney disease with stage 1 through stage 4 chronic kidney disease, or unspecified chronic kidney disease: Secondary | ICD-10-CM | POA: Diagnosis not present

## 2018-02-06 DIAGNOSIS — E785 Hyperlipidemia, unspecified: Secondary | ICD-10-CM | POA: Diagnosis not present

## 2018-02-06 DIAGNOSIS — E669 Obesity, unspecified: Secondary | ICD-10-CM | POA: Diagnosis not present

## 2018-02-06 DIAGNOSIS — Z7982 Long term (current) use of aspirin: Secondary | ICD-10-CM | POA: Diagnosis not present

## 2018-02-06 DIAGNOSIS — Z7984 Long term (current) use of oral hypoglycemic drugs: Secondary | ICD-10-CM | POA: Diagnosis not present

## 2018-02-06 DIAGNOSIS — I2510 Atherosclerotic heart disease of native coronary artery without angina pectoris: Secondary | ICD-10-CM | POA: Diagnosis not present

## 2018-02-06 DIAGNOSIS — E1122 Type 2 diabetes mellitus with diabetic chronic kidney disease: Secondary | ICD-10-CM | POA: Diagnosis not present

## 2018-02-08 ENCOUNTER — Telehealth: Payer: Self-pay | Admitting: Cardiovascular Disease

## 2018-02-08 DIAGNOSIS — N183 Chronic kidney disease, stage 3 (moderate): Secondary | ICD-10-CM | POA: Diagnosis not present

## 2018-02-08 DIAGNOSIS — I4891 Unspecified atrial fibrillation: Secondary | ICD-10-CM | POA: Diagnosis not present

## 2018-02-08 DIAGNOSIS — I2510 Atherosclerotic heart disease of native coronary artery without angina pectoris: Secondary | ICD-10-CM | POA: Diagnosis not present

## 2018-02-08 DIAGNOSIS — I129 Hypertensive chronic kidney disease with stage 1 through stage 4 chronic kidney disease, or unspecified chronic kidney disease: Secondary | ICD-10-CM | POA: Diagnosis not present

## 2018-02-08 DIAGNOSIS — E1122 Type 2 diabetes mellitus with diabetic chronic kidney disease: Secondary | ICD-10-CM | POA: Diagnosis not present

## 2018-02-08 DIAGNOSIS — I2119 ST elevation (STEMI) myocardial infarction involving other coronary artery of inferior wall: Secondary | ICD-10-CM | POA: Diagnosis not present

## 2018-02-08 NOTE — Telephone Encounter (Signed)
Pt's daughter is calling    Pt has a dry cough and need to know if he can take some allergy medication. Please advise. And what can he take

## 2018-02-08 NOTE — Telephone Encounter (Signed)
Returned call-advised ok to take Zyrtec or Claritin.   Verbalized understanding.

## 2018-02-11 NOTE — Progress Notes (Signed)
Cardiology Office Note   Date:  02/12/2018   ID:  Daryl Rodriguez, DOB [birth year], MRN [id number]  PCP:  Nicoletta Dress, MD  Cardiologist:  [facility] Chief Complaint  Patient presents with  . Hospitalization Follow-up    discuss transitioning from Brilinta Plavix, wants generic of Pacerone     History of Present Illness: Daryl Rodriguez is a 84 y.o. male who presents for posthospitalization follow-up (TOC) after admission for STEMI involving the right coronary artery, acute pulmonary edema, respiratory failure, and episodes of VT.  Other history includes obesity, hypertension, hyperlipidemia, and type 2 diabetes.  Patient had cardiac catheterization completed on 01/22/2018, revealing total occlusion of a calcified mid right coronary artery with thrombus formation, also multivessel CAD with an additional 60% to 70% calcified stenosis of the mid LAD and 75% stenosis of the left circumflex vessel on 01/22/2018.The patient subsequently had a difficult but successful PCI to the totally occluded mid RCA with PTCA and Synergy DES.  This restored brisk TIMI-3 flow. Medical management is planned for residual CAD. EF was calculated at 40-45% with diffuse hypokinesis per echo  Post catheterization, the patient had A. fib RVR and was placed on IV heparin along with diltiazem.  His rate remained uncontrolled and beta-blocker therapy was added.  The patient then developed VT and SVT with associated abdominal pain.  He was started on IV amiodarone.  He was seen by Dr. Lovena Le, electrophysiologist, with recommendations to continue amiodarone and add lidocaine if he had recurrent VT.  On 01/24/2018 the patient had recurrent VT with a rate of 230 bpm which required cardioversion, with recurrent VT and need for second cardioversion in the setting of cardiac arrest.. Re-Look cath was completed on 01/25/2018. This did not indicate evidence of new occlusions.   A decision was made to place ICD (St. Jude) due to recurrent  VT and cardiac arrest which was placed on 01/30/2018 by Dr. Lovena Le.  The patient was found to be anemic and therefore he was not placed on anticoagulation therapy in the setting of PAF.  The patient was continued on aspirin and Brilinta for duration of  1 month, then switch to Plavix of Brilinta was cost prohibitive.  The patient was transitioned to p.o. amiodarone 400 mg daily.  He is here today without complaints of recurrent chest pain, but has noticed an irregular HR at times. He has been having some fluid retention and mild dyspnea as well. He denies bleeding, melena, or hemoptysis. He denies pain at the ICD insertion site. His weigh is up 4 -5 lbs since discharge.   He and his daughter have multiple questions concerning his medications and heart disease.   Past Medical History:  Diagnosis Date  . Acute respiratory failure ([place])   . Cardiac arrest ([place])   . DM II (diabetes mellitus, type II), controlled ([place])   . Hyperlipidemia   . Hypertension   . STEMI (ST elevation myocardial infarction) ([place])    01/22/18 PCI/DES to RCA  . Systolic heart failure ([place])   . VT (ventricular tachycardia) ([place])     Past Surgical History:  Procedure Laterality Date  . BACK SURGERY    . CORONARY STENT INTERVENTION N/A 01/22/2018   Procedure: CORONARY STENT INTERVENTION;  Surgeon: Troy Sine, MD;  Location: [place] [facility];  Service: Cardiovascular;  Laterality: N/A;  . CORONARY/GRAFT ACUTE MI REVASCULARIZATION N/A 01/22/2018   Procedure: Coronary/Graft Acute MI Revascularization;  Surgeon: Troy Sine, MD;  Location: [place] [facility];  Service:  Cardiovascular;  Laterality: N/A;  . ICD IMPLANT N/A 01/30/2018   Procedure: ICD IMPLANT;  Surgeon: Evans Lance, MD;  Location: [place] [facility];  Service: Cardiovascular;  Laterality: N/A;  . LEFT HEART CATH AND CORONARY ANGIOGRAPHY N/A 01/22/2018   Procedure: LEFT HEART CATH AND CORONARY ANGIOGRAPHY;  Surgeon: Troy Sine, MD;  Location: [place] [facility];  Service: Cardiovascular;  Laterality: N/A;  . LEFT HEART CATH AND CORONARY ANGIOGRAPHY N/A 01/25/2018   Procedure: LEFT HEART CATH AND CORONARY ANGIOGRAPHY - RELOOK;  Surgeon: Wellington Hampshire, MD;  Location: [place] [facility];  Service: Cardiovascular;  Laterality: N/A;  . WRIST SURGERY       Current Outpatient Medications  Medication Sig Dispense Refill  . ammonium lactate (LAC-HYDRIN) 12 % lotion Apply 1 application topically 2 (two) times daily. Arms legs and hands    . aspirin EC 81 MG tablet Take 81 mg by mouth daily.    Marland Kitchen atorvastatin (LIPITOR) 80 MG tablet Take 1 tablet (80 mg total) by mouth daily at 6 PM. 90 tablet 1  . Cyanocobalamin (VITAMIN B 12 PO) Take 1 tablet by mouth daily.    Marland Kitchen gabapentin (NEURONTIN) 300 MG capsule Take 1 capsule by mouth 2 (two) times daily.  1  . glimepiride (AMARYL) 4 MG tablet Take 4 mg by mouth daily.    . iron polysaccharides (NIFEREX) 150 MG capsule Take 1 capsule (150 mg total) by mouth daily. 30 capsule 1  . isosorbide mononitrate (IMDUR) 30 MG 24 hr tablet Take 1 tablet (30 mg total) by mouth daily. 30 tablet 2  . latanoprost (XALATAN) 0.005 % ophthalmic solution Place 1 drop into both eyes at bedtime.    . metoprolol tartrate (LOPRESSOR) 50 MG tablet Take 50 mg by mouth 2 (two) times daily.    . nitroGLYCERIN (NITROSTAT) 0.4 MG SL tablet Place 1 tablet (0.4 mg total) under the tongue every 5 (five) minutes x 3 doses as needed for chest pain. 25 tablet 1  . terazosin (HYTRIN) 1 MG capsule Take 1 mg by mouth daily.    Marland Kitchen triamcinolone cream (KENALOG) 0.1 % Apply 1 application topically as needed (Itchy skin).     . Vitamin D, Ergocalciferol, (DRISDOL) 50000 units CAPS capsule Take 50,000 Units by mouth once a week. Thursdays    . amiodarone (PACERONE) 400 MG tablet Take 1 tablet (400 mg total) by mouth daily. 90 tablet 3  . clopidogrel (PLAVIX) 75 MG tablet Take 1 tablet (75 mg total) by mouth daily. 90 tablet 3  . furosemide  (LASIX) 20 MG tablet Take 1 tablet (20 mg total) by mouth daily. 30 tablet 3  . potassium chloride SA (KLOR-CON M20) 20 MEQ tablet Take 1 tablet (20 mEq total) by mouth daily. 30 tablet 3   No current facility-administered medications for this visit.     Allergies:   Patient has no known allergies.    Social History:  The patient  reports that he has quit smoking. He has never used smokeless tobacco. He reports that he drank alcohol. He reports that he has current or past drug history.   Family History:  The patient's family history includes Heart attack (age of onset: 55) in his father.    ROS: All other systems are reviewed and negative. Unless otherwise mentioned in H&P    PHYSICAL EXAM: VS:  BP 130/72   Pulse 66   Ht 5\' 9"  (1.753 m)   Wt 221 lb (100.2 kg)  BMI 32.64 kg/m  , BMI Body mass index is 32.64 kg/m. GEN: Well nourished, well developed, in no acute distress  HEENT: normal  Neck: no JVD, carotid bruits, or masses Cardiac: RRR; no murmurs, rubs, or gallops,2+ pretibial edema  Respiratory:  clear to auscultation bilaterally, normal work of breathing GI: soft, nontender, mildly distended, + BS MS: no deformity or atrophy ICD incision site is healing well, no evidence of infection or hematoma.  Skin: warm and dry, no rash Neuro:  Strength and sensation are intact Psych: euthymic mood, full affect   EKG:  SR with 1st degree AV block, T-wave inversion laterally V5 and V6. Prolonged QT interval 448 ms/Qtc 469 ms. HR of 66 bpm.  Recent Labs: 01/22/2018: B Natriuretic Peptide 695.6 01/23/2018: ALT 30 01/30/2018: Hemoglobin 8.2; Magnesium 2.0; Platelets 312 01/31/2018: BUN 23; Creatinine, Ser 1.25; Potassium 4.2; Sodium 136    Lipid Panel    Component Value Date/Time   CHOL 126 01/22/2018 1632   TRIG 75 01/22/2018 1632   HDL 43 01/22/2018 1632   CHOLHDL 2.9 01/22/2018 1632   VLDL 15 01/22/2018 1632   LDLCALC 68 01/22/2018 1632      Wt Readings from Last 3  Encounters:  02/12/18 221 lb (100.2 kg)  02/01/18 217 lb 9.5 oz (98.7 kg)      Other studies Reviewed: Cardiac Cath 01/22/2018 Conclusion     Mid Cx lesion is 75% stenosed.  Prox LAD to Mid LAD lesion is 65% stenosed.  Dist RCA lesion is 20% stenosed.  Mid RCA lesion is 30% stenosed.  Prox RCA to Mid RCA lesion is 100% stenosed.  Post intervention, there is a 0% residual stenosis.  A stent was successfully placed.   Acute ST segment elevation myocardial infarction secondary to total occlusion of a calcified mid right coronary artery with thrombus formation with initial TIMI 0 flow.  Coronary calcification with multivessel CAD and additional 60-70% calcified stenosis of the mid LAD and 75% stenoses of the left circumflex vessel.  Difficult but successful percutaneous coronary intervention to the totally occluded calcified mid RCA with PTCA, and ultimate insertion of a 3.024 mm Synergy DES stent postdilated to 3.25 mm with the 100% occlusion being reduced to 0% and restoration of brisk TIMI-3 flow.   Re-look Cath 01/25/2018 Conclusion     Prox LAD to Mid LAD lesion is 70% stenosed.  Mid Cx lesion is 75% stenosed.  Dist RCA lesion is 40% stenosed.  Mid RCA lesion is 40% stenosed.  Previously placed Prox RCA to Mid RCA stent (unknown type) is widely patent.   1.  Widely patent RCA stent with no significant restenosis.  Unchanged borderline significant calcified disease affecting the LAD and left circumflex. 2.  Moderately elevated left ventricular end-diastolic pressure.  Left ventricular angiography was not performed. 3.  Difficult procedure via the right radial artery due to significant tortuosity affecting the right subclavian and innominate arteries.   Echocardiogram 01/23/2018 Left ventricle: The cavity size was mildly dilated. Systolic   function was mildly to moderately reduced. The estimated ejection   fraction was in the range of 40% to 45%. Diffuse  hypokinesis.   Doppler parameters are consistent with abnormal left ventricular   relaxation (grade 1 diastolic dysfunction). - Mitral valve: There was mild regurgitation. - Left atrium: The atrium was mildly dilated. - Right ventricle: The cavity size was moderately dilated. Wall   thickness was normal. - Tricuspid valve: There was mild regurgitation. - Pulmonary arteries: Systolic pressure was mildly  increased. PA   peak pressure: 35 mm Hg (S).   ASSESSMENT AND PLAN:  1. Ischemic cardiomyopathy: EF of 40%-45% per echo during hospitalization.   2. Recurrent VT: On amiodarone 400 mg daily. ICD implant (St Jude) per Dr. Lovena Le.  Follow up incision check in two days, and appt with Dr Lovena Le in Sept.   3. CAD: S/P inferior MI requiring DES to the RCA x 1. Currently on DAPT with ASA and Brilinta for 30 days. He will transition to Plavix after this due to cost. He will remain on metoprolol.   4. PAF: CHADS VASC Score of 4  (Age, HTN, Reduced EF, DM). I am reluctant to start him on Eliquis until I review most recent CBC. On discharge Hgb of 8.2/Hct of 25 8. He was found to have some blood in G NG tube during hospitalization but no frank bleeding was seen. There was no hemoccult completed. I will await new lab values before beginning anticoagulation therapy along with Plavix. If Eliquis is started, will discontinue ASA. Creatinine 1.25 with GFR 52.   5. HFrEF: Reduced EF of 40-45% with evidence of volume overload. He will be started on lasix 20 mg daily and potassium 20 mEq daily. He will have BMET drawn with CBC this week. Uncertain of CHF is related to reduced CO, PAF or anemia.   6. Anemia: Noted during hospitalization. Heparin was stopped as inpatient with evidence of bleeding noted in NG tube but resolved with discontinuation of heparin and NG tube.  He is on iron replacement. If follow up labs reveal ongoing anemia, will refer to hematology and hold off on anticoagulation therapy.   Current  medicines are reviewed at length with the patient today.  Will see him on close follow up in one month. I have also discussed this with Dr. Claiborne Billings, his primary cardiologist who is on site today. Multiple questions are answered with explanation of CAD, ICD, and medications.   Labs/ tests ordered today include: CBC BMET   Phill Myron. West Pugh, ANP, AACC   02/12/2018 11:43 AM    [facility] [address], [place], [place] [address] Phone: [phone]; Fax: [phone]

## 2018-02-12 ENCOUNTER — Ambulatory Visit (INDEPENDENT_AMBULATORY_CARE_PROVIDER_SITE_OTHER): Payer: Medicare Other | Admitting: Adult Health

## 2018-02-12 ENCOUNTER — Encounter: Payer: Self-pay | Admitting: Adult Health

## 2018-02-12 ENCOUNTER — Ambulatory Visit: Payer: Medicare Other

## 2018-02-12 VITALS — BP 130/72 | HR 66 | Ht 69.0 in | Wt 221.0 lb

## 2018-02-12 DIAGNOSIS — I5022 Chronic systolic (congestive) heart failure: Secondary | ICD-10-CM

## 2018-02-12 DIAGNOSIS — E7800 Pure hypercholesterolemia, unspecified: Secondary | ICD-10-CM | POA: Diagnosis not present

## 2018-02-12 DIAGNOSIS — Z95810 Presence of automatic (implantable) cardiac defibrillator: Secondary | ICD-10-CM | POA: Diagnosis not present

## 2018-02-12 DIAGNOSIS — Z79899 Other long term (current) drug therapy: Secondary | ICD-10-CM

## 2018-02-12 DIAGNOSIS — I2111 ST elevation (STEMI) myocardial infarction involving right coronary artery: Secondary | ICD-10-CM | POA: Diagnosis not present

## 2018-02-12 DIAGNOSIS — I43 Cardiomyopathy in diseases classified elsewhere: Secondary | ICD-10-CM | POA: Diagnosis not present

## 2018-02-12 DIAGNOSIS — I2510 Atherosclerotic heart disease of native coronary artery without angina pectoris: Secondary | ICD-10-CM | POA: Diagnosis not present

## 2018-02-12 DIAGNOSIS — I10 Essential (primary) hypertension: Secondary | ICD-10-CM

## 2018-02-12 DIAGNOSIS — D649 Anemia, unspecified: Secondary | ICD-10-CM

## 2018-02-12 MED ORDER — CLOPIDOGREL BISULFATE 75 MG PO TABS: 75.0000 mg | ORAL_TABLET | Freq: Every day | ORAL | 3 refills | Status: DC

## 2018-02-12 MED ORDER — FUROSEMIDE 20 MG PO TABS: 20.0000 mg | ORAL_TABLET | Freq: Every day | ORAL | 3 refills | Status: DC

## 2018-02-12 MED ORDER — POTASSIUM CHLORIDE CRYS ER 20 MEQ PO TBCR: 20.0000 meq | EXTENDED_RELEASE_TABLET | Freq: Every day | ORAL | 3 refills | Status: AC

## 2018-02-12 MED ORDER — APIXABAN 5 MG PO TABS: 5.0000 mg | ORAL_TABLET | Freq: Two times a day (BID) | ORAL | 5 refills | Status: DC

## 2018-02-12 MED ORDER — AMIODARONE HCL 400 MG PO TABS: 400.0000 mg | ORAL_TABLET | Freq: Every day | ORAL | 3 refills | Status: DC

## 2018-02-12 NOTE — Patient Instructions (Signed)
Medication Instructions:  START PLAVIX AS DISCUSSED (AFTER STOPPING BRILINTA)   START POTASSIUM 20MG  DAILY  START LASIX 20MG  DAILY  If you need a refill on your cardiac medications before your next appointment, please call your pharmacy.  Labwork: CBC AND BMET IN 1 WEEK HERE IN OUR OFFICE AT [facility]  Take the provided lab slips with you to the lab for your blood draw.   You will NOT need to fast   Special Instructions: TAKE BP AND WEIGHT AND LOG DAILY  Follow-Up: Your physician wants you to follow-up in: [place] ([place]), DNP,AACC IF PRIMARY CARDIOLOGIST IS UNAVAILABLE.   Thank you for choosing [facility] at [facility]!!

## 2018-02-13 ENCOUNTER — Telehealth: Payer: Self-pay | Admitting: Adult Health

## 2018-02-13 DIAGNOSIS — I129 Hypertensive chronic kidney disease with stage 1 through stage 4 chronic kidney disease, or unspecified chronic kidney disease: Secondary | ICD-10-CM | POA: Diagnosis not present

## 2018-02-13 DIAGNOSIS — E1122 Type 2 diabetes mellitus with diabetic chronic kidney disease: Secondary | ICD-10-CM | POA: Diagnosis not present

## 2018-02-13 DIAGNOSIS — I4891 Unspecified atrial fibrillation: Secondary | ICD-10-CM | POA: Diagnosis not present

## 2018-02-13 DIAGNOSIS — I2119 ST elevation (STEMI) myocardial infarction involving other coronary artery of inferior wall: Secondary | ICD-10-CM | POA: Diagnosis not present

## 2018-02-13 DIAGNOSIS — N183 Chronic kidney disease, stage 3 (moderate): Secondary | ICD-10-CM | POA: Diagnosis not present

## 2018-02-13 DIAGNOSIS — I2510 Atherosclerotic heart disease of native coronary artery without angina pectoris: Secondary | ICD-10-CM | POA: Diagnosis not present

## 2018-02-13 MED ORDER — AMIODARONE HCL 200 MG PO TABS: 400.0000 mg | ORAL_TABLET | Freq: Every day | ORAL | 1 refills | Status: DC

## 2018-02-13 NOTE — Telephone Encounter (Signed)
New Message:      Pt c/o medication issue:  1. Name of Medication: amiodarone (PACERONE) 400 MG tablet  apixaban (ELIQUIS) 5 MG TABS tablet  2. How are you currently taking this medication (dosage and times per day)? Take 1 tablet (400 mg total) by mouth daily.  Take 1 tablet (5 mg total) by mouth 2 (two) times daily.  3. Are you having a reaction (difficulty breathing--STAT)? No  4. What is your medication issue? Pt's daughter has some questions about these two medications

## 2018-02-13 NOTE — Telephone Encounter (Signed)
Discussed medication dosages and times with daughter (DPR). No further questions

## 2018-02-15 ENCOUNTER — Ambulatory Visit (INDEPENDENT_AMBULATORY_CARE_PROVIDER_SITE_OTHER): Payer: Medicare Other | Admitting: *Deleted

## 2018-02-15 DIAGNOSIS — I129 Hypertensive chronic kidney disease with stage 1 through stage 4 chronic kidney disease, or unspecified chronic kidney disease: Secondary | ICD-10-CM | POA: Diagnosis not present

## 2018-02-15 DIAGNOSIS — I43 Cardiomyopathy in diseases classified elsewhere: Secondary | ICD-10-CM | POA: Diagnosis not present

## 2018-02-15 DIAGNOSIS — N183 Chronic kidney disease, stage 3 (moderate): Secondary | ICD-10-CM | POA: Diagnosis not present

## 2018-02-15 DIAGNOSIS — E1122 Type 2 diabetes mellitus with diabetic chronic kidney disease: Secondary | ICD-10-CM | POA: Diagnosis not present

## 2018-02-15 DIAGNOSIS — I472 Ventricular tachycardia, unspecified: Secondary | ICD-10-CM

## 2018-02-15 DIAGNOSIS — Z95810 Presence of automatic (implantable) cardiac defibrillator: Secondary | ICD-10-CM | POA: Diagnosis not present

## 2018-02-15 DIAGNOSIS — I4891 Unspecified atrial fibrillation: Secondary | ICD-10-CM | POA: Diagnosis not present

## 2018-02-15 DIAGNOSIS — I2510 Atherosclerotic heart disease of native coronary artery without angina pectoris: Secondary | ICD-10-CM | POA: Diagnosis not present

## 2018-02-15 DIAGNOSIS — I2119 ST elevation (STEMI) myocardial infarction involving other coronary artery of inferior wall: Secondary | ICD-10-CM | POA: Diagnosis not present

## 2018-02-15 LAB — CUP PACEART INCLINIC DEVICE CHECK
Battery Remaining Longevity: 97 mo
Brady Statistic RV Percent Paced: 0 %
Date Time Interrogation Session: 20190613104833
HIGH POWER IMPEDANCE MEASURED VALUE: 60 Ohm
Implantable Lead Location: 753860
Implantable Pulse Generator Implant Date: 20190528
Lead Channel Impedance Value: 550 Ohm
Lead Channel Setting Pacing Amplitude: 3.5 V
Lead Channel Setting Pacing Pulse Width: 0.5 ms
Lead Channel Setting Sensing Sensitivity: 0.5 mV
MDC IDC LEAD IMPLANT DT: 20190528
MDC IDC MSMT LEADCHNL RV PACING THRESHOLD AMPLITUDE: 1 V
MDC IDC MSMT LEADCHNL RV PACING THRESHOLD PULSEWIDTH: 0.5 ms
MDC IDC MSMT LEADCHNL RV SENSING INTR AMPL: 9.1 mV
Pulse Gen Serial Number: 9781126

## 2018-02-15 NOTE — Progress Notes (Signed)
Wound check appointment. Steri-strips removed. Wound without redness or edema. Incision edges approximated, wound well healed. Normal device function. Thresholds, sensing, and impedances consistent with implant measurements. Device programmed at 3.5V for extra safety margin until 3 month visit. Histogram distribution appropriate for patient and level of activity. No ventricular arrhythmias noted. Patient educated about wound care, arm mobility, lifting restrictions, shock plan, and Merlin monitor. ROV with GT on 05/09/18.

## 2018-02-20 ENCOUNTER — Other Ambulatory Visit: Payer: Self-pay | Admitting: *Deleted

## 2018-02-20 DIAGNOSIS — Z79899 Other long term (current) drug therapy: Secondary | ICD-10-CM | POA: Diagnosis not present

## 2018-02-20 DIAGNOSIS — I10 Essential (primary) hypertension: Secondary | ICD-10-CM | POA: Diagnosis not present

## 2018-02-20 DIAGNOSIS — I252 Old myocardial infarction: Secondary | ICD-10-CM | POA: Diagnosis not present

## 2018-02-20 DIAGNOSIS — Z955 Presence of coronary angioplasty implant and graft: Secondary | ICD-10-CM | POA: Diagnosis not present

## 2018-02-20 DIAGNOSIS — I4891 Unspecified atrial fibrillation: Secondary | ICD-10-CM | POA: Diagnosis not present

## 2018-02-20 DIAGNOSIS — Z7982 Long term (current) use of aspirin: Secondary | ICD-10-CM | POA: Diagnosis not present

## 2018-02-20 DIAGNOSIS — E785 Hyperlipidemia, unspecified: Secondary | ICD-10-CM | POA: Diagnosis not present

## 2018-02-21 DIAGNOSIS — I4891 Unspecified atrial fibrillation: Secondary | ICD-10-CM | POA: Diagnosis not present

## 2018-02-21 DIAGNOSIS — Z7982 Long term (current) use of aspirin: Secondary | ICD-10-CM | POA: Diagnosis not present

## 2018-02-21 DIAGNOSIS — E785 Hyperlipidemia, unspecified: Secondary | ICD-10-CM | POA: Diagnosis not present

## 2018-02-21 DIAGNOSIS — Z955 Presence of coronary angioplasty implant and graft: Secondary | ICD-10-CM | POA: Diagnosis not present

## 2018-02-21 DIAGNOSIS — I10 Essential (primary) hypertension: Secondary | ICD-10-CM | POA: Diagnosis not present

## 2018-02-21 DIAGNOSIS — I252 Old myocardial infarction: Secondary | ICD-10-CM | POA: Diagnosis not present

## 2018-02-23 DIAGNOSIS — I10 Essential (primary) hypertension: Secondary | ICD-10-CM | POA: Diagnosis not present

## 2018-02-23 DIAGNOSIS — Z7982 Long term (current) use of aspirin: Secondary | ICD-10-CM | POA: Diagnosis not present

## 2018-02-23 DIAGNOSIS — Z955 Presence of coronary angioplasty implant and graft: Secondary | ICD-10-CM | POA: Diagnosis not present

## 2018-02-23 DIAGNOSIS — I4891 Unspecified atrial fibrillation: Secondary | ICD-10-CM | POA: Diagnosis not present

## 2018-02-23 DIAGNOSIS — I252 Old myocardial infarction: Secondary | ICD-10-CM | POA: Diagnosis not present

## 2018-02-23 DIAGNOSIS — E785 Hyperlipidemia, unspecified: Secondary | ICD-10-CM | POA: Diagnosis not present

## 2018-02-26 DIAGNOSIS — Z79899 Other long term (current) drug therapy: Secondary | ICD-10-CM | POA: Diagnosis not present

## 2018-02-26 DIAGNOSIS — I4891 Unspecified atrial fibrillation: Secondary | ICD-10-CM | POA: Diagnosis not present

## 2018-02-26 DIAGNOSIS — I2111 ST elevation (STEMI) myocardial infarction involving right coronary artery: Secondary | ICD-10-CM | POA: Diagnosis not present

## 2018-02-26 DIAGNOSIS — I10 Essential (primary) hypertension: Secondary | ICD-10-CM | POA: Diagnosis not present

## 2018-02-26 DIAGNOSIS — I252 Old myocardial infarction: Secondary | ICD-10-CM | POA: Diagnosis not present

## 2018-02-26 DIAGNOSIS — Z7982 Long term (current) use of aspirin: Secondary | ICD-10-CM | POA: Diagnosis not present

## 2018-02-26 DIAGNOSIS — Z955 Presence of coronary angioplasty implant and graft: Secondary | ICD-10-CM | POA: Diagnosis not present

## 2018-02-26 DIAGNOSIS — E785 Hyperlipidemia, unspecified: Secondary | ICD-10-CM | POA: Diagnosis not present

## 2018-02-26 LAB — BASIC METABOLIC PANEL
BUN / CREAT RATIO: 16 (ref 10–24)
BUN: 18 mg/dL (ref 8–27)
CHLORIDE: 106 mmol/L (ref 96–106)
CO2: 23 mmol/L (ref 20–29)
Calcium: 8.9 mg/dL (ref 8.6–10.2)
Creatinine, Ser: 1.15 mg/dL (ref 0.76–1.27)
GFR calc Af Amer: 69 mL/min/{1.73_m2} (ref >59)
GFR calc non Af Amer: 59 mL/min/{1.73_m2} — ABNORMAL LOW (ref >59)
GLUCOSE: 145 mg/dL — AB (ref 65–99)
POTASSIUM: 4.5 mmol/L (ref 3.5–5.2)
SODIUM: 141 mmol/L (ref 134–144)

## 2018-02-26 LAB — CBC
HEMOGLOBIN: 9.7 g/dL — AB (ref 13.0–17.7)
Hematocrit: 30.7 % — ABNORMAL LOW (ref 37.5–51.0)
MCH: 30.8 pg (ref 26.6–33.0)
MCHC: 31.6 g/dL (ref 31.5–35.7)
MCV: 98 fL — ABNORMAL HIGH (ref 79–97)
PLATELETS: 236 10*3/uL (ref 150–450)
RBC: 3.15 x10E6/uL — ABNORMAL LOW (ref 4.14–5.80)
RDW: 17 % — AB (ref 12.3–15.4)
WBC: 6.3 10*3/uL (ref 3.4–10.8)

## 2018-02-27 ENCOUNTER — Telehealth: Payer: Self-pay | Admitting: Adult Health

## 2018-02-27 NOTE — Telephone Encounter (Signed)
New Message    Patients daughter Asencion Partridge is calling on his behalf. He had an appointment on 06/10 and she has several questions not just one in particular. Please call to discuss.

## 2018-02-27 NOTE — Telephone Encounter (Signed)
1. Pacerone = amiodarone   2. End Brilinta after Friday night dose, then start Plavix   3.

## 2018-02-27 NOTE — Telephone Encounter (Signed)
3. Eliquis 30 day free card and patient assistance.

## 2018-02-27 NOTE — Telephone Encounter (Signed)
Daughter has many questions regarding patient's medications, very confused about brilinta and eliquis and pacerone doses, She also got lisinopril from mail order she is not aware of. Please reach out to her

## 2018-02-28 DIAGNOSIS — Z955 Presence of coronary angioplasty implant and graft: Secondary | ICD-10-CM | POA: Diagnosis not present

## 2018-02-28 DIAGNOSIS — I252 Old myocardial infarction: Secondary | ICD-10-CM | POA: Diagnosis not present

## 2018-02-28 DIAGNOSIS — I10 Essential (primary) hypertension: Secondary | ICD-10-CM | POA: Diagnosis not present

## 2018-02-28 DIAGNOSIS — Z7982 Long term (current) use of aspirin: Secondary | ICD-10-CM | POA: Diagnosis not present

## 2018-02-28 DIAGNOSIS — E785 Hyperlipidemia, unspecified: Secondary | ICD-10-CM | POA: Diagnosis not present

## 2018-02-28 DIAGNOSIS — I4891 Unspecified atrial fibrillation: Secondary | ICD-10-CM | POA: Diagnosis not present

## 2018-03-02 DIAGNOSIS — I10 Essential (primary) hypertension: Secondary | ICD-10-CM | POA: Diagnosis not present

## 2018-03-02 DIAGNOSIS — E785 Hyperlipidemia, unspecified: Secondary | ICD-10-CM | POA: Diagnosis not present

## 2018-03-02 DIAGNOSIS — Z955 Presence of coronary angioplasty implant and graft: Secondary | ICD-10-CM | POA: Diagnosis not present

## 2018-03-02 DIAGNOSIS — Z7982 Long term (current) use of aspirin: Secondary | ICD-10-CM | POA: Diagnosis not present

## 2018-03-02 DIAGNOSIS — I252 Old myocardial infarction: Secondary | ICD-10-CM | POA: Diagnosis not present

## 2018-03-02 DIAGNOSIS — I4891 Unspecified atrial fibrillation: Secondary | ICD-10-CM | POA: Diagnosis not present

## 2018-03-05 DIAGNOSIS — I252 Old myocardial infarction: Secondary | ICD-10-CM | POA: Diagnosis not present

## 2018-03-05 DIAGNOSIS — Z955 Presence of coronary angioplasty implant and graft: Secondary | ICD-10-CM | POA: Diagnosis not present

## 2018-03-05 DIAGNOSIS — I4891 Unspecified atrial fibrillation: Secondary | ICD-10-CM | POA: Diagnosis not present

## 2018-03-05 DIAGNOSIS — Z79899 Other long term (current) drug therapy: Secondary | ICD-10-CM | POA: Diagnosis not present

## 2018-03-05 DIAGNOSIS — I10 Essential (primary) hypertension: Secondary | ICD-10-CM | POA: Diagnosis not present

## 2018-03-05 DIAGNOSIS — E785 Hyperlipidemia, unspecified: Secondary | ICD-10-CM | POA: Diagnosis not present

## 2018-03-05 DIAGNOSIS — Z7982 Long term (current) use of aspirin: Secondary | ICD-10-CM | POA: Diagnosis not present

## 2018-03-06 DIAGNOSIS — I10 Essential (primary) hypertension: Secondary | ICD-10-CM | POA: Diagnosis not present

## 2018-03-06 DIAGNOSIS — E785 Hyperlipidemia, unspecified: Secondary | ICD-10-CM | POA: Diagnosis not present

## 2018-03-06 DIAGNOSIS — Z955 Presence of coronary angioplasty implant and graft: Secondary | ICD-10-CM | POA: Diagnosis not present

## 2018-03-06 DIAGNOSIS — I252 Old myocardial infarction: Secondary | ICD-10-CM | POA: Diagnosis not present

## 2018-03-06 DIAGNOSIS — I4891 Unspecified atrial fibrillation: Secondary | ICD-10-CM | POA: Diagnosis not present

## 2018-03-06 DIAGNOSIS — Z7982 Long term (current) use of aspirin: Secondary | ICD-10-CM | POA: Diagnosis not present

## 2018-03-07 DIAGNOSIS — Z7982 Long term (current) use of aspirin: Secondary | ICD-10-CM | POA: Diagnosis not present

## 2018-03-07 DIAGNOSIS — I4891 Unspecified atrial fibrillation: Secondary | ICD-10-CM | POA: Diagnosis not present

## 2018-03-07 DIAGNOSIS — Z955 Presence of coronary angioplasty implant and graft: Secondary | ICD-10-CM | POA: Diagnosis not present

## 2018-03-07 DIAGNOSIS — I252 Old myocardial infarction: Secondary | ICD-10-CM | POA: Diagnosis not present

## 2018-03-07 DIAGNOSIS — E785 Hyperlipidemia, unspecified: Secondary | ICD-10-CM | POA: Diagnosis not present

## 2018-03-07 DIAGNOSIS — I10 Essential (primary) hypertension: Secondary | ICD-10-CM | POA: Diagnosis not present

## 2018-03-12 ENCOUNTER — Telehealth: Payer: Self-pay | Admitting: Cardiovascular Disease

## 2018-03-12 DIAGNOSIS — Z7982 Long term (current) use of aspirin: Secondary | ICD-10-CM | POA: Diagnosis not present

## 2018-03-12 DIAGNOSIS — E785 Hyperlipidemia, unspecified: Secondary | ICD-10-CM | POA: Diagnosis not present

## 2018-03-12 DIAGNOSIS — I10 Essential (primary) hypertension: Secondary | ICD-10-CM | POA: Diagnosis not present

## 2018-03-12 DIAGNOSIS — I252 Old myocardial infarction: Secondary | ICD-10-CM | POA: Diagnosis not present

## 2018-03-12 DIAGNOSIS — I4891 Unspecified atrial fibrillation: Secondary | ICD-10-CM | POA: Diagnosis not present

## 2018-03-12 DIAGNOSIS — Z955 Presence of coronary angioplasty implant and graft: Secondary | ICD-10-CM | POA: Diagnosis not present

## 2018-03-12 NOTE — Telephone Encounter (Signed)
Spoke to daughter (ok per DPR) who states last week patient had cardiac rehab 3 days in a row and started having some SOB.   They attributed this to the rehab and the heat, this resolved. Patient states he felt fine over the weekend, had some minor SOB Saturday but otherwise no symptoms, went to church on Sunday and felt okay.   Today he has started to have SOB again.   No chest pain, no swelling, no dizziness, etc.   She states he went to cardiac rehab this morning and they were not concerned, his "numbers" looked fine.   They weigh him every morning and there has been no increase, his weight has actually decreased.   BP 120s-160s/80s HR 60-70s.    Denies dark stool or blood in urine.   Moved appt to tomorrow 7/9 with Arnold Long DNP at 10:30 am.    Daughter aware and verbalized understanding.

## 2018-03-12 NOTE — Telephone Encounter (Signed)
New message:      Pt's daughter is calling and states that there has been a change to some medications in the pt's dosages. Pt's daughter states everything is going good but he has shallow breaths but he is fine. But she want's to know is it okay to wait until Wednesday

## 2018-03-13 ENCOUNTER — Encounter: Payer: Self-pay | Admitting: Adult Health

## 2018-03-13 ENCOUNTER — Ambulatory Visit (INDEPENDENT_AMBULATORY_CARE_PROVIDER_SITE_OTHER): Payer: Medicare Other | Admitting: Adult Health

## 2018-03-13 VITALS — BP 171/72 | HR 73 | Ht 69.0 in | Wt 218.0 lb

## 2018-03-13 DIAGNOSIS — I10 Essential (primary) hypertension: Secondary | ICD-10-CM

## 2018-03-13 DIAGNOSIS — I2510 Atherosclerotic heart disease of native coronary artery without angina pectoris: Secondary | ICD-10-CM

## 2018-03-13 DIAGNOSIS — Z79899 Other long term (current) drug therapy: Secondary | ICD-10-CM

## 2018-03-13 DIAGNOSIS — D649 Anemia, unspecified: Secondary | ICD-10-CM

## 2018-03-13 DIAGNOSIS — R0600 Dyspnea, unspecified: Secondary | ICD-10-CM

## 2018-03-13 MED ORDER — AMLODIPINE BESYLATE 5 MG PO TABS: 5.0000 mg | ORAL_TABLET | Freq: Every day | ORAL | 3 refills | Status: DC

## 2018-03-13 MED ORDER — AMIODARONE HCL 200 MG PO TABS: 200.0000 mg | ORAL_TABLET | Freq: Every day | ORAL | 1 refills | Status: DC

## 2018-03-13 NOTE — Patient Instructions (Signed)
Medication Instructions:  DECREASE AMIODARONE 200MG  DAILY  START AMLODIPINE 5MG  DAILY  MAKE SURE B-12 IS 540mcg DAILY If you need a refill on your cardiac medications before your next appointment, please call your pharmacy.  Labwork: CBC AND BMET TODAY HERE IN OUR OFFICE AT [facility]  Take the provided lab slips with you to the lab for your blood draw.   Testing/Procedures: Your physician has recommended that you have a pulmonary function test. Pulmonary Function Tests are a group of tests that measure how well air moves in and out of your lungs.  Special Instructions: CONTINUE TO TAKE AND LOG BP DAILY-BRING TO APPOINTMENTS WITH YOU  Follow-Up:  Your physician wants you to follow-up in: 2 WEEKS FOR BP/MEDICATION WITH PHARMACIST  Your physician wants you to follow-up in: [place].    Thank you for choosing [facility] at [facility]!!

## 2018-03-13 NOTE — Progress Notes (Signed)
Cardiology Office Note   Date:  03/13/2018   ID:  Daryl Rodriguez, DOB 07-Oct-1933, MRN [id number]  PCP:  Nicoletta Dress, MD  Cardiologist:  Dr. Claiborne Billings  Chief Complaint  Patient presents with  . Follow-up  . Medication Refill     History of Present Illness: Daryl Rodriguez is a 84 y.o. male who presents for focused appointment concerning worsening dyspnea. He has a history of STEMI involving the RCA, pulmonary edema,and respiratory failure with episodes of VT. Other history includes obesity, hypertension, hyperlipidemia, and type 2 diabetes.  Patient had cardiac catheterization completed on 01/22/2018, revealing total occlusion of a calcified mid right coronary artery with thrombus formation, also multivessel CAD with an additional 60% to 70% calcified stenosis of the mid LAD and 75% stenosis of the left circumflex vessel on 01/22/2018.The patient subsequently had a difficult but successful PCI to the totally occluded mid RCA with PTCA and Synergy DES.  This restored brisk TIMI-3 flow. Medical management is planned for residual CAD. EF was calculated at 40-45% with diffuse hypokinesis per echo  A decision was made to place ICD (St. Jude) due to recurrent VT and cardiac arrest which was placed on 01/30/2018 by Dr. Lovena Le.  The patient was found to be anemic and therefore he was not placed on anticoagulation therapy in the setting of PAF.  The patient was continued on aspirin and Brilinta for duration of  1 month, then switch to Plavix of Brilinta was cost prohibitive.  The patient was transitioned to p.o. amiodarone 400 mg daily.  He and his daughter have multiple questions about medications and his symptoms. He apparently is not improved on breathing status. He has undergone cardiac rehab and due to holiday, had 3 days in a row of exercise, and had become very tired. He felt better over the weekend and had rehab yesterday, and again had dyspnea.   Past Medical History:  Diagnosis Date  . Acute  respiratory failure ([place])   . Cardiac arrest ([place])   . DM II (diabetes mellitus, type II), controlled ([place])   . Hyperlipidemia   . Hypertension   . STEMI (ST elevation myocardial infarction) ([place])    01/22/18 PCI/DES to RCA  . Systolic heart failure ([place])   . VT (ventricular tachycardia) ([place])     Past Surgical History:  Procedure Laterality Date  . BACK SURGERY    . CORONARY STENT INTERVENTION N/A 01/22/2018   Procedure: CORONARY STENT INTERVENTION;  Surgeon: Troy Sine, MD;  Location: [facility];  Service: Cardiovascular;  Laterality: N/A;  . CORONARY/GRAFT ACUTE MI REVASCULARIZATION N/A 01/22/2018   Procedure: Coronary/Graft Acute MI Revascularization;  Surgeon: Troy Sine, MD;  Location: [facility];  Service: Cardiovascular;  Laterality: N/A;  . ICD IMPLANT N/A 01/30/2018   Procedure: ICD IMPLANT;  Surgeon: Evans Lance, MD;  Location: [facility];  Service: Cardiovascular;  Laterality: N/A;  . LEFT HEART CATH AND CORONARY ANGIOGRAPHY N/A 01/22/2018   Procedure: LEFT HEART CATH AND CORONARY ANGIOGRAPHY;  Surgeon: Troy Sine, MD;  Location: [facility];  Service: Cardiovascular;  Laterality: N/A;  . LEFT HEART CATH AND CORONARY ANGIOGRAPHY N/A 01/25/2018   Procedure: LEFT HEART CATH AND CORONARY ANGIOGRAPHY - RELOOK;  Surgeon: Wellington Hampshire, MD;  Location: [facility];  Service: Cardiovascular;  Laterality: N/A;  . WRIST SURGERY       Current Outpatient Medications  Medication Sig Dispense Refill  . amiodarone (PACERONE) 200 MG  tablet Take 1 tablet (200 mg total) by mouth daily. 180 tablet 1  . ammonium lactate (LAC-HYDRIN) 12 % lotion Apply 1 application topically 2 (two) times daily. Arms legs and hands    . aspirin EC 81 MG tablet Take 81 mg by mouth daily.    Marland Kitchen atorvastatin (LIPITOR) 80 MG tablet Take 1 tablet (80 mg total) by mouth daily at 6 PM. 90 tablet 1  . clopidogrel (PLAVIX) 75 MG tablet Take 1 tablet (75 mg total)  by mouth daily. 90 tablet 3  . Cyanocobalamin (VITAMIN B 12 PO) Take 500 mcg by mouth daily.    . furosemide (LASIX) 20 MG tablet Take 1 tablet (20 mg total) by mouth daily. 30 tablet 3  . gabapentin (NEURONTIN) 300 MG capsule Take 1 capsule by mouth 2 (two) times daily.  1  . glimepiride (AMARYL) 4 MG tablet Take 4 mg by mouth daily.    . iron polysaccharides (NIFEREX) 150 MG capsule Take 1 capsule (150 mg total) by mouth daily. 30 capsule 1  . isosorbide mononitrate (IMDUR) 30 MG 24 hr tablet Take 1 tablet (30 mg total) by mouth daily. 30 tablet 2  . latanoprost (XALATAN) 0.005 % ophthalmic solution Place 1 drop into both eyes at bedtime.    . metoprolol tartrate (LOPRESSOR) 50 MG tablet Take 50 mg by mouth 2 (two) times daily.    . nitroGLYCERIN (NITROSTAT) 0.4 MG SL tablet Place 1 tablet (0.4 mg total) under the tongue every 5 (five) minutes x 3 doses as needed for chest pain. 25 tablet 1  . potassium chloride SA (KLOR-CON M20) 20 MEQ tablet Take 1 tablet (20 mEq total) by mouth daily. 30 tablet 3  . terazosin (HYTRIN) 1 MG capsule Take 1 mg by mouth daily.    Marland Kitchen triamcinolone cream (KENALOG) 0.1 % Apply 1 application topically as needed (Itchy skin).     . Vitamin D, Ergocalciferol, (DRISDOL) 50000 units CAPS capsule Take 50,000 Units by mouth once a week. Thursdays    . amLODipine (NORVASC) 5 MG tablet Take 1 tablet (5 mg total) by mouth daily. 180 tablet 3   No current facility-administered medications for this visit.     Allergies:   Patient has no known allergies.    Social History:  The patient  reports that he has quit smoking. He has never used smokeless tobacco. He reports that he drank alcohol. He reports that he has current or past drug history.   Family History:  The patient's family history includes Heart attack (age of onset: 42) in his father.    ROS: All other systems are reviewed and negative. Unless otherwise mentioned in H&P    PHYSICAL EXAM: VS:  BP (!) 171/72    Pulse 73   Ht 5\' 9"  (1.753 m)   Wt 218 lb (98.9 kg)   SpO2 95%   BMI 32.19 kg/m  , BMI Body mass index is 32.19 kg/m. GEN: Well nourished, well developed, in no acute distress Obese HEENT: normal  Neck: no JVD, carotid bruits, or masses Cardiac: RRR; no murmurs, rubs, or gallops,no edema  Respiratory:  Diminished in the bases.  GI: soft, nontender, nondistended, + BS MS: no deformity or atrophy  Skin: warm and dry, no rash Neuro:  Strength and sensation are intact Psych: euthymic mood, full affect   EKG:  Not completed during this office visit.   Recent Labs: 01/22/2018: B Natriuretic Peptide 695.6 01/23/2018: ALT 30 01/30/2018: Magnesium 2.0 02/26/2018: BUN 18; Creatinine,  Ser 1.15; Hemoglobin 9.7; Platelets 236; Potassium 4.5; Sodium 141    Lipid Panel    Component Value Date/Time   CHOL 126 01/22/2018 1632   TRIG 75 01/22/2018 1632   HDL 43 01/22/2018 1632   CHOLHDL 2.9 01/22/2018 1632   VLDL 15 01/22/2018 1632   LDLCALC 68 01/22/2018 1632      Wt Readings from Last 3 Encounters:  03/13/18 218 lb (98.9 kg)  02/12/18 221 lb (100.2 kg)  02/01/18 217 lb 9.5 oz (98.7 kg)    Other studies Reviewed: Echocardiogram 01-28-18 Left ventricle: The cavity size was mildly dilated. Systolic   function was mildly to moderately reduced. The estimated ejection   fraction was in the range of 40% to 45%. Diffuse hypokinesis.   Doppler parameters are consistent with abnormal left ventricular   relaxation (grade 1 diastolic dysfunction). - Mitral valve: There was mild regurgitation. - Left atrium: The atrium was mildly dilated. - Right ventricle: The cavity size was moderately dilated. Wall   thickness was normal. - Tricuspid valve: There was mild regurgitation. - Pulmonary arteries: Systolic pressure was mildly increased. PA   peak pressure: 35 mm Hg (S).  Cardiac Cath 01/25/2018 Conclusion     Prox LAD to Mid LAD lesion is 70% stenosed.  Mid Cx lesion is 75%  stenosed.  Dist RCA lesion is 40% stenosed.  Mid RCA lesion is 40% stenosed.  Previously placed Prox RCA to Mid RCA stent (unknown type) is widely patent.   1.  Widely patent RCA stent with no significant restenosis.  Unchanged borderline significant calcified disease affecting the LAD and left circumflex. 2.  Moderately elevated left ventricular end-diastolic pressure.  Left ventricular angiography was not performed. 3.  Difficult procedure via the right radial artery due to significant tortuosity affecting the right subclavian and innominate arteries.      ASSESSMENT AND PLAN:  1.  Dyspnea: I will check PFT;s to evaluate lung function. Decrease amiodarone to 200 mg daily. This will not cause significant change in status as this levels will have to decrease over time, but hopefully if amiodarone is contributing to breathing status, this will be of help.   2. Hypertension; BP is elevated. Will reduction of amiodarone, I will add back amlodipine 5 mg daily. He will follow up with pharmacy in 2 weeks for BP check.       3. CAD: Hx of DES to the RCA proximal and mid. 70% LAD stenosis. Consider repeating cath if symptoms persist despite medication manipulation, or normal PFT.    4. ICD in situ: Followed by Dr. Lovena Le. Due to see him on Sept 4, 2019 with ongoing remote checks per protocol   :5. OSA Continues on CPAP and has been compliant.   6. Obesity: May be contributing to overall breathing status.   7. Anemia: Checking CBC once again to evaluate status on DAPT.    Current medicines are reviewed at length with the patient today.  I have spent > 35 minutes with this patient and his daughter answering multiple questions.   Labs/ tests ordered today include: PFTs. BMET and CBC   Phill Myron. West Pugh, ANP, AACC   03/13/2018 12:11 PM    [facility] [address], [place], [place] [address] Phone: [phone]; Fax: [phone]

## 2018-03-14 ENCOUNTER — Ambulatory Visit: Payer: Medicare Other | Admitting: Adult Health

## 2018-03-14 DIAGNOSIS — R0602 Shortness of breath: Secondary | ICD-10-CM | POA: Diagnosis not present

## 2018-03-14 LAB — CBC
HEMATOCRIT: 33 % — AB (ref 37.5–51.0)
Hemoglobin: 10.2 g/dL — ABNORMAL LOW (ref 13.0–17.7)
MCH: 30.1 pg (ref 26.6–33.0)
MCHC: 30.9 g/dL — AB (ref 31.5–35.7)
MCV: 97 fL (ref 79–97)
Platelets: 238 10*3/uL (ref 150–450)
RBC: 3.39 x10E6/uL — AB (ref 4.14–5.80)
RDW: 16.5 % — ABNORMAL HIGH (ref 12.3–15.4)
WBC: 7.5 10*3/uL (ref 3.4–10.8)

## 2018-03-14 LAB — BASIC METABOLIC PANEL
BUN / CREAT RATIO: 24 (ref 10–24)
BUN: 24 mg/dL (ref 8–27)
CO2: 24 mmol/L (ref 20–29)
CREATININE: 1 mg/dL (ref 0.76–1.27)
Calcium: 9.2 mg/dL (ref 8.6–10.2)
Chloride: 105 mmol/L (ref 96–106)
GFR, EST AFRICAN AMERICAN: 81 mL/min/{1.73_m2} (ref >59)
GFR, EST NON AFRICAN AMERICAN: 70 mL/min/{1.73_m2} (ref >59)
Glucose: 95 mg/dL (ref 65–99)
Potassium: 4.9 mmol/L (ref 3.5–5.2)
SODIUM: 143 mmol/L (ref 134–144)

## 2018-03-19 DIAGNOSIS — Z1339 Encounter for screening examination for other mental health and behavioral disorders: Secondary | ICD-10-CM | POA: Diagnosis not present

## 2018-03-19 DIAGNOSIS — R0602 Shortness of breath: Secondary | ICD-10-CM | POA: Diagnosis not present

## 2018-03-19 DIAGNOSIS — J189 Pneumonia, unspecified organism: Secondary | ICD-10-CM | POA: Diagnosis not present

## 2018-03-19 DIAGNOSIS — I509 Heart failure, unspecified: Secondary | ICD-10-CM | POA: Diagnosis not present

## 2018-03-19 DIAGNOSIS — Z95810 Presence of automatic (implantable) cardiac defibrillator: Secondary | ICD-10-CM | POA: Diagnosis not present

## 2018-03-21 ENCOUNTER — Other Ambulatory Visit: Payer: Self-pay | Admitting: Cardiology

## 2018-03-21 DIAGNOSIS — Z7982 Long term (current) use of aspirin: Secondary | ICD-10-CM | POA: Diagnosis not present

## 2018-03-21 DIAGNOSIS — E785 Hyperlipidemia, unspecified: Secondary | ICD-10-CM | POA: Diagnosis not present

## 2018-03-21 DIAGNOSIS — Z955 Presence of coronary angioplasty implant and graft: Secondary | ICD-10-CM | POA: Diagnosis not present

## 2018-03-21 DIAGNOSIS — I252 Old myocardial infarction: Secondary | ICD-10-CM | POA: Diagnosis not present

## 2018-03-21 DIAGNOSIS — I4891 Unspecified atrial fibrillation: Secondary | ICD-10-CM | POA: Diagnosis not present

## 2018-03-21 DIAGNOSIS — I10 Essential (primary) hypertension: Secondary | ICD-10-CM | POA: Diagnosis not present

## 2018-03-23 DIAGNOSIS — I4891 Unspecified atrial fibrillation: Secondary | ICD-10-CM | POA: Diagnosis not present

## 2018-03-23 DIAGNOSIS — I509 Heart failure, unspecified: Secondary | ICD-10-CM | POA: Diagnosis not present

## 2018-03-23 DIAGNOSIS — I252 Old myocardial infarction: Secondary | ICD-10-CM | POA: Diagnosis not present

## 2018-03-23 DIAGNOSIS — I10 Essential (primary) hypertension: Secondary | ICD-10-CM | POA: Diagnosis not present

## 2018-03-23 DIAGNOSIS — E785 Hyperlipidemia, unspecified: Secondary | ICD-10-CM | POA: Diagnosis not present

## 2018-03-23 DIAGNOSIS — Z7982 Long term (current) use of aspirin: Secondary | ICD-10-CM | POA: Diagnosis not present

## 2018-03-23 DIAGNOSIS — Z955 Presence of coronary angioplasty implant and graft: Secondary | ICD-10-CM | POA: Diagnosis not present

## 2018-03-26 DIAGNOSIS — E785 Hyperlipidemia, unspecified: Secondary | ICD-10-CM | POA: Diagnosis not present

## 2018-03-26 DIAGNOSIS — I10 Essential (primary) hypertension: Secondary | ICD-10-CM | POA: Diagnosis not present

## 2018-03-26 DIAGNOSIS — I252 Old myocardial infarction: Secondary | ICD-10-CM | POA: Diagnosis not present

## 2018-03-26 DIAGNOSIS — I517 Cardiomegaly: Secondary | ICD-10-CM | POA: Diagnosis not present

## 2018-03-26 DIAGNOSIS — I700 Atherosclerosis of aorta: Secondary | ICD-10-CM | POA: Diagnosis not present

## 2018-03-26 DIAGNOSIS — J189 Pneumonia, unspecified organism: Secondary | ICD-10-CM | POA: Diagnosis not present

## 2018-03-26 DIAGNOSIS — Z955 Presence of coronary angioplasty implant and graft: Secondary | ICD-10-CM | POA: Diagnosis not present

## 2018-03-26 DIAGNOSIS — Z7982 Long term (current) use of aspirin: Secondary | ICD-10-CM | POA: Diagnosis not present

## 2018-03-26 DIAGNOSIS — I4891 Unspecified atrial fibrillation: Secondary | ICD-10-CM | POA: Diagnosis not present

## 2018-03-26 DIAGNOSIS — J90 Pleural effusion, not elsewhere classified: Secondary | ICD-10-CM | POA: Diagnosis not present

## 2018-03-30 DIAGNOSIS — Z95810 Presence of automatic (implantable) cardiac defibrillator: Secondary | ICD-10-CM | POA: Diagnosis not present

## 2018-03-30 DIAGNOSIS — I509 Heart failure, unspecified: Secondary | ICD-10-CM | POA: Diagnosis not present

## 2018-03-30 DIAGNOSIS — I252 Old myocardial infarction: Secondary | ICD-10-CM | POA: Diagnosis not present

## 2018-03-30 DIAGNOSIS — Z7982 Long term (current) use of aspirin: Secondary | ICD-10-CM | POA: Diagnosis not present

## 2018-03-30 DIAGNOSIS — Z955 Presence of coronary angioplasty implant and graft: Secondary | ICD-10-CM | POA: Diagnosis not present

## 2018-03-30 DIAGNOSIS — I4891 Unspecified atrial fibrillation: Secondary | ICD-10-CM | POA: Diagnosis not present

## 2018-03-30 DIAGNOSIS — R0602 Shortness of breath: Secondary | ICD-10-CM | POA: Diagnosis not present

## 2018-03-30 DIAGNOSIS — E785 Hyperlipidemia, unspecified: Secondary | ICD-10-CM | POA: Diagnosis not present

## 2018-03-30 DIAGNOSIS — I10 Essential (primary) hypertension: Secondary | ICD-10-CM | POA: Diagnosis not present

## 2018-04-02 DIAGNOSIS — I4891 Unspecified atrial fibrillation: Secondary | ICD-10-CM | POA: Diagnosis not present

## 2018-04-02 DIAGNOSIS — E785 Hyperlipidemia, unspecified: Secondary | ICD-10-CM | POA: Diagnosis not present

## 2018-04-02 DIAGNOSIS — Z955 Presence of coronary angioplasty implant and graft: Secondary | ICD-10-CM | POA: Diagnosis not present

## 2018-04-02 DIAGNOSIS — Z7982 Long term (current) use of aspirin: Secondary | ICD-10-CM | POA: Diagnosis not present

## 2018-04-02 DIAGNOSIS — M6281 Muscle weakness (generalized): Secondary | ICD-10-CM | POA: Diagnosis not present

## 2018-04-02 DIAGNOSIS — I10 Essential (primary) hypertension: Secondary | ICD-10-CM | POA: Diagnosis not present

## 2018-04-02 DIAGNOSIS — R2681 Unsteadiness on feet: Secondary | ICD-10-CM | POA: Diagnosis not present

## 2018-04-02 DIAGNOSIS — I252 Old myocardial infarction: Secondary | ICD-10-CM | POA: Diagnosis not present

## 2018-04-04 ENCOUNTER — Encounter: Payer: Self-pay | Admitting: Cardiology

## 2018-04-04 ENCOUNTER — Ambulatory Visit: Payer: Medicare Other

## 2018-04-04 DIAGNOSIS — I252 Old myocardial infarction: Secondary | ICD-10-CM | POA: Diagnosis not present

## 2018-04-04 DIAGNOSIS — Z955 Presence of coronary angioplasty implant and graft: Secondary | ICD-10-CM | POA: Diagnosis not present

## 2018-04-04 DIAGNOSIS — Z7982 Long term (current) use of aspirin: Secondary | ICD-10-CM | POA: Diagnosis not present

## 2018-04-04 DIAGNOSIS — I10 Essential (primary) hypertension: Secondary | ICD-10-CM | POA: Diagnosis not present

## 2018-04-04 DIAGNOSIS — I4891 Unspecified atrial fibrillation: Secondary | ICD-10-CM | POA: Diagnosis not present

## 2018-04-04 DIAGNOSIS — E785 Hyperlipidemia, unspecified: Secondary | ICD-10-CM | POA: Diagnosis not present

## 2018-04-06 DIAGNOSIS — I252 Old myocardial infarction: Secondary | ICD-10-CM | POA: Diagnosis not present

## 2018-04-06 DIAGNOSIS — Z79899 Other long term (current) drug therapy: Secondary | ICD-10-CM | POA: Diagnosis not present

## 2018-04-06 DIAGNOSIS — Z7982 Long term (current) use of aspirin: Secondary | ICD-10-CM | POA: Diagnosis not present

## 2018-04-06 DIAGNOSIS — Z955 Presence of coronary angioplasty implant and graft: Secondary | ICD-10-CM | POA: Diagnosis not present

## 2018-04-06 DIAGNOSIS — I4891 Unspecified atrial fibrillation: Secondary | ICD-10-CM | POA: Diagnosis not present

## 2018-04-06 DIAGNOSIS — I10 Essential (primary) hypertension: Secondary | ICD-10-CM | POA: Diagnosis not present

## 2018-04-06 DIAGNOSIS — E785 Hyperlipidemia, unspecified: Secondary | ICD-10-CM | POA: Diagnosis not present

## 2018-04-09 ENCOUNTER — Other Ambulatory Visit: Payer: Self-pay | Admitting: Cardiology

## 2018-04-09 DIAGNOSIS — Z7982 Long term (current) use of aspirin: Secondary | ICD-10-CM | POA: Diagnosis not present

## 2018-04-09 DIAGNOSIS — E785 Hyperlipidemia, unspecified: Secondary | ICD-10-CM | POA: Diagnosis not present

## 2018-04-09 DIAGNOSIS — I10 Essential (primary) hypertension: Secondary | ICD-10-CM | POA: Diagnosis not present

## 2018-04-09 DIAGNOSIS — I4891 Unspecified atrial fibrillation: Secondary | ICD-10-CM | POA: Diagnosis not present

## 2018-04-09 DIAGNOSIS — Z955 Presence of coronary angioplasty implant and graft: Secondary | ICD-10-CM | POA: Diagnosis not present

## 2018-04-09 DIAGNOSIS — I252 Old myocardial infarction: Secondary | ICD-10-CM | POA: Diagnosis not present

## 2018-04-09 NOTE — Telephone Encounter (Signed)
This is Dr. Kelly's pt. °

## 2018-04-11 DIAGNOSIS — I10 Essential (primary) hypertension: Secondary | ICD-10-CM | POA: Diagnosis not present

## 2018-04-11 DIAGNOSIS — E785 Hyperlipidemia, unspecified: Secondary | ICD-10-CM | POA: Diagnosis not present

## 2018-04-11 DIAGNOSIS — Z7982 Long term (current) use of aspirin: Secondary | ICD-10-CM | POA: Diagnosis not present

## 2018-04-11 DIAGNOSIS — I4891 Unspecified atrial fibrillation: Secondary | ICD-10-CM | POA: Diagnosis not present

## 2018-04-11 DIAGNOSIS — Z955 Presence of coronary angioplasty implant and graft: Secondary | ICD-10-CM | POA: Diagnosis not present

## 2018-04-11 DIAGNOSIS — I252 Old myocardial infarction: Secondary | ICD-10-CM | POA: Diagnosis not present

## 2018-04-12 ENCOUNTER — Ambulatory Visit: Payer: Medicare Other | Admitting: Cardiology

## 2018-04-13 ENCOUNTER — Ambulatory Visit (INDEPENDENT_AMBULATORY_CARE_PROVIDER_SITE_OTHER): Payer: Medicare Other | Admitting: Cardiology

## 2018-04-13 ENCOUNTER — Encounter

## 2018-04-13 ENCOUNTER — Encounter: Payer: Self-pay | Admitting: Cardiology

## 2018-04-13 VITALS — BP 130/70 | HR 56 | Ht 69.0 in | Wt 208.0 lb

## 2018-04-13 DIAGNOSIS — I10 Essential (primary) hypertension: Secondary | ICD-10-CM | POA: Diagnosis not present

## 2018-04-13 DIAGNOSIS — I255 Ischemic cardiomyopathy: Secondary | ICD-10-CM

## 2018-04-13 DIAGNOSIS — I429 Cardiomyopathy, unspecified: Secondary | ICD-10-CM | POA: Insufficient documentation

## 2018-04-13 DIAGNOSIS — J810 Acute pulmonary edema: Secondary | ICD-10-CM | POA: Diagnosis not present

## 2018-04-13 DIAGNOSIS — R0683 Snoring: Secondary | ICD-10-CM

## 2018-04-13 DIAGNOSIS — I6523 Occlusion and stenosis of bilateral carotid arteries: Secondary | ICD-10-CM | POA: Diagnosis not present

## 2018-04-13 DIAGNOSIS — I43 Cardiomyopathy in diseases classified elsewhere: Secondary | ICD-10-CM | POA: Diagnosis not present

## 2018-04-13 DIAGNOSIS — E119 Type 2 diabetes mellitus without complications: Secondary | ICD-10-CM

## 2018-04-13 DIAGNOSIS — I2510 Atherosclerotic heart disease of native coronary artery without angina pectoris: Secondary | ICD-10-CM

## 2018-04-13 DIAGNOSIS — Z955 Presence of coronary angioplasty implant and graft: Secondary | ICD-10-CM | POA: Diagnosis not present

## 2018-04-13 DIAGNOSIS — I5022 Chronic systolic (congestive) heart failure: Secondary | ICD-10-CM

## 2018-04-13 DIAGNOSIS — R0600 Dyspnea, unspecified: Secondary | ICD-10-CM | POA: Diagnosis not present

## 2018-04-13 DIAGNOSIS — I4891 Unspecified atrial fibrillation: Secondary | ICD-10-CM | POA: Diagnosis not present

## 2018-04-13 DIAGNOSIS — I252 Old myocardial infarction: Secondary | ICD-10-CM | POA: Diagnosis not present

## 2018-04-13 DIAGNOSIS — Z7982 Long term (current) use of aspirin: Secondary | ICD-10-CM | POA: Diagnosis not present

## 2018-04-13 DIAGNOSIS — E785 Hyperlipidemia, unspecified: Secondary | ICD-10-CM | POA: Diagnosis not present

## 2018-04-13 DIAGNOSIS — I472 Ventricular tachycardia, unspecified: Secondary | ICD-10-CM

## 2018-04-13 DIAGNOSIS — I25110 Atherosclerotic heart disease of native coronary artery with unstable angina pectoris: Secondary | ICD-10-CM | POA: Insufficient documentation

## 2018-04-13 NOTE — Progress Notes (Signed)
Cardiology Consultation:    Date:  04/13/2018   ID:  Ramiel Forti, DOB November 12, 1933, MRN [id number]  PCP:  Nicoletta Dress, MD  Cardiologist:  [name], MD   Referring MD: Renaldo Reel, [place]   Chief Complaint  Patient presents with  . Establish Care  Doing well  History of Present Illness:    Nas Wafer is a 84 y.o. male who is being seen today for the evaluation of coronary artery disease myocardial infarction at the request of Renaldo Reel, [place].   Patient had cardiac catheterization completed on 01/22/2018, revealing total occlusion of a calcified mid right coronary artery with thrombus formation, also multivessel CAD with an additional 60% to 70% calcified stenosis of the mid LAD and 75%stenosis of the left circumflex vessel on 01/22/2018.The patient subsequently had a difficult but successful PCI to the totally occluded mid RCA with PTCA and Synergy DES. This restored brisk TIMI-3 flow. Medical management is planned for residual CAD. EF was calculated at 40-45% with diffuse hypokinesis per echo  A decision was made to place ICD(St. Jude)due to recurrent VTand cardiac arrestwhich was placed on 01/30/2018 by Dr. Lovena Le. The patient was found to be anemic and therefore he was not placed on anticoagulation therapyin the setting of PAF. He comes today to office to be established as a patient.  Denies having any chest pain tightness squeezing pressure mentions fatigue and tiredness is still there.    Past Medical History:  Diagnosis Date  . Acute respiratory failure ([place])   . BPH (benign prostatic hyperplasia)   . Cardiac arrest ([place])   . Carpal tunnel syndrome   . CKD (chronic kidney disease)   . Diabetes ([place])   . DM II (diabetes mellitus, type II), controlled ([place])   . High risk medication use   . Hyperlipidemia   . Hypertension   . Low vitamin D level   . STEMI (ST elevation myocardial infarction) ([place])    01/22/18 PCI/DES to RCA  . Systolic heart  failure ([place])   . VT (ventricular tachycardia) ([place])     Past Surgical History:  Procedure Laterality Date  . BACK SURGERY    . CORONARY STENT INTERVENTION N/A 01/22/2018   Procedure: CORONARY STENT INTERVENTION;  Surgeon: Troy Sine, MD;  Location: [facility];  Service: Cardiovascular;  Laterality: N/A;  . CORONARY/GRAFT ACUTE MI REVASCULARIZATION N/A 01/22/2018   Procedure: Coronary/Graft Acute MI Revascularization;  Surgeon: Troy Sine, MD;  Location: [facility];  Service: Cardiovascular;  Laterality: N/A;  . ICD IMPLANT N/A 01/30/2018   Procedure: ICD IMPLANT;  Surgeon: Evans Lance, MD;  Location: [facility];  Service: Cardiovascular;  Laterality: N/A;  . LEFT HEART CATH AND CORONARY ANGIOGRAPHY N/A 01/22/2018   Procedure: LEFT HEART CATH AND CORONARY ANGIOGRAPHY;  Surgeon: Troy Sine, MD;  Location: [facility];  Service: Cardiovascular;  Laterality: N/A;  . LEFT HEART CATH AND CORONARY ANGIOGRAPHY N/A 01/25/2018   Procedure: LEFT HEART CATH AND CORONARY ANGIOGRAPHY - RELOOK;  Surgeon: Wellington Hampshire, MD;  Location: [facility];  Service: Cardiovascular;  Laterality: N/A;  . WRIST SURGERY      Current Medications: Current Meds  Medication Sig  . amiodarone (PACERONE) 200 MG tablet Take 1 tablet (200 mg total) by mouth daily.  Marland Kitchen amLODipine (NORVASC) 5 MG tablet Take 1 tablet (5 mg total) by mouth daily.  Marland Kitchen ammonium lactate (LAC-HYDRIN) 12 % lotion Apply 1  application topically 2 (two) times daily. Arms legs and hands  . aspirin EC 81 MG tablet Take 81 mg by mouth daily.  Marland Kitchen atorvastatin (LIPITOR) 80 MG tablet Take 1 tablet (80 mg total) by mouth daily at 6 PM.  . clopidogrel (PLAVIX) 75 MG tablet Take 1 tablet (75 mg total) by mouth daily.  . Cyanocobalamin (VITAMIN B 12 PO) Take 500 mcg by mouth daily.  Marland Kitchen FERREX 150 150 MG capsule TAKE 1 CAPSULE BY MOUTH ONCE DAILY  . furosemide (LASIX) 20 MG tablet Take 1 tablet (20 mg total)  by mouth daily. (Patient taking differently: Take 40 mg by mouth 2 (two) times daily. )  . gabapentin (NEURONTIN) 300 MG capsule Take 1 capsule by mouth 2 (two) times daily.  Marland Kitchen glimepiride (AMARYL) 4 MG tablet Take 4 mg by mouth daily.  . isosorbide mononitrate (IMDUR) 30 MG 24 hr tablet TAKE 1 TABLET BY MOUTH ONCE DAILY  . latanoprost (XALATAN) 0.005 % ophthalmic solution Place 1 drop into both eyes at bedtime.  . metoprolol tartrate (LOPRESSOR) 50 MG tablet Take 50 mg by mouth 2 (two) times daily.  . nitroGLYCERIN (NITROSTAT) 0.4 MG SL tablet Place 1 tablet (0.4 mg total) under the tongue every 5 (five) minutes x 3 doses as needed for chest pain.  . potassium chloride SA (KLOR-CON M20) 20 MEQ tablet Take 1 tablet (20 mEq total) by mouth daily.  Marland Kitchen terazosin (HYTRIN) 1 MG capsule Take 1 mg by mouth daily.  Marland Kitchen triamcinolone cream (KENALOG) 0.1 % Apply 1 application topically as needed (Itchy skin).   . Vitamin D, Ergocalciferol, (DRISDOL) 50000 units CAPS capsule Take 50,000 Units by mouth once a week. Thursdays     Allergies:   Patient has no known allergies.   Social History   Socioeconomic History  . Marital status: Married    Spouse name: Not on file  . Number of children: Not on file  . Years of education: Not on file  . Highest education level: Not on file  Occupational History  . Not on file  Social Needs  . Financial resource strain: Not on file  . Food insecurity:    Worry: Not on file    Inability: Not on file  . Transportation needs:    Medical: Not on file    Non-medical: Not on file  Tobacco Use  . Smoking status: Former Research scientist (life sciences)  . Smokeless tobacco: Never Used  . Tobacco comment: quit smoking 50 years ago  Substance and Sexual Activity  . Alcohol use: Not Currently  . Drug use: Not Currently  . Sexual activity: Not on file  Lifestyle  . Physical activity:    Days per week: Not on file    Minutes per session: Not on file  . Stress: Not on file  Relationships    . Social connections:    Talks on phone: Not on file    Gets together: Not on file    Attends religious service: Not on file    Active member of club or organization: Not on file    Attends meetings of clubs or organizations: Not on file    Relationship status: Not on file  Other Topics Concern  . Not on file  Social History Narrative  . Not on file     Family History: The patient's family history includes Heart attack in his mother; Heart attack (age of onset: 46) in his father; Stroke in his sister. ROS:   Please see the history  of present illness.    All 14 point review of systems negative except as described per history of present illness.  EKGs/Labs/Other Studies Reviewed:    The following studies were reviewed today: Cardiac catheterization from [facility] reviewed.  All events in the hospital reviewed as well    Recent Labs: 01/22/2018: B Natriuretic Peptide 695.6 01/23/2018: ALT 30 01/30/2018: Magnesium 2.0 03/13/2018: BUN 24; Creatinine, Ser 1.00; Hemoglobin 10.2; Platelets 238; Potassium 4.9; Sodium 143  Recent Lipid Panel    Component Value Date/Time   CHOL 126 01/22/2018 1632   TRIG 75 01/22/2018 1632   HDL 43 01/22/2018 1632   CHOLHDL 2.9 01/22/2018 1632   VLDL 15 01/22/2018 1632   LDLCALC 68 01/22/2018 1632    Physical Exam:    VS:  BP 130/70 (BP Location: Right Arm, Patient Position: Sitting, Cuff Size: Normal)   Pulse (!) 56   Ht 5\' 9"  (1.753 m)   Wt 208 lb (94.3 kg)   SpO2 97%   BMI 30.72 kg/m     Wt Readings from Last 3 Encounters:  04/13/18 208 lb (94.3 kg)  03/13/18 218 lb (98.9 kg)  02/12/18 221 lb (100.2 kg)     GEN:  Well nourished, well developed in no acute distress HEENT: Normal NECK: No JVD; No carotid bruits LYMPHATICS: No lymphadenopathy CARDIAC: RRR, no murmurs, no rubs, no gallops RESPIRATORY:  Clear to auscultation without rales, wheezing or rhonchi  ABDOMEN: Soft, non-tender, non-distended MUSCULOSKELETAL:  No edema; No  deformity  SKIN: Warm and dry NEUROLOGIC:  Alert and oriented x 3 PSYCHIATRIC:  Normal affect   ASSESSMENT:    1. Dyslipidemia   2. Essential hypertension   3. Acute pulmonary edema (HCC)   4. Dyspnea, unspecified type   5. Cardiomyopathy as manifestation of underlying disease ([place])   6. Chronic systolic heart failure (HCC)   7. V tach ([place])   8. Bilateral carotid artery stenosis   9. Type 2 diabetes mellitus without complication, without long-term current use of insulin ([place])   10. Snoring   11. Coronary artery disease involving native coronary artery of native heart without angina pectoris   12. Ischemic cardiomyopathy    PLAN:    In order of problems listed above:  1. Coronary artery disease status post PTCA and stenting to mid RCA in face of acute myocardial infarction he still on aspirin as well as Plavix.  He could not afford Brilinta.  He is on beta-blocker statin not on ACE inhibitor I suspect secondary to kidney dysfunction although he seems to be doing well.  He does have residual 70% circumflex as well as LAD.  For now we will continue present management. 2. Cardiomyopathy with ejection fraction 40 to 45%.  I will ask him to have Chem-7 done today as well as proBNP based on that we decide if we can add ACE inhibitor that can be beneficial for his sternal area we will continue with beta-blocker. 3. History of cardiac arrest: He does have ICD which being followed by our EP clinic.  He is already on amiodarone which I will continue.  Seems to be doing well denies having any discharges from the defibrillator no dizziness no passing out 4. History of sleep apnea use CPAP mask on the regular basis. 5. History of V. tach on amiodarone which I will continue.  Overall very sick gentleman with multiple medical issues.  A lot of record had to be reviewed for this visit.  Will check his Chem-7 based  on Chem-7 will decide if he can restart ACE inhibitor that he was on before that would be  beneficial for multiple reasons.  His defibrillator being followed by our EP clinic.  I see him back in my office in the next few weeks.  The key will be to put him on the right medical therapy.   Medication Adjustments/Labs and Tests Ordered: Current medicines are reviewed at length with the patient today.  Concerns regarding medicines are outlined above.  Orders Placed This Encounter  Procedures  . CBC  . Basic metabolic panel  . Pro b natriuretic peptide (BNP)   No orders of the defined types were placed in this encounter.   Signed, Park Liter, MD, [facility]. 04/13/2018 5:16 PM    [place]

## 2018-04-13 NOTE — Patient Instructions (Signed)
Medication Instructions:  Your physician recommends that you continue on your current medications as directed. Please refer to the Current Medication list given to you today.   Labwork: Your physician recommends that you return for lab work today: BMP, CBC, BNP.   Testing/Procedures: None.  Follow-Up: Your physician recommends that you schedule a follow-up appointment in: 3 weeks.   Any Other Special Instructions Will Be Listed Below (If Applicable).     If you need a refill on your cardiac medications before your next appointment, please call your pharmacy.

## 2018-04-14 LAB — CBC
Hematocrit: 37.5 % (ref 37.5–51.0)
Hemoglobin: 12 g/dL — ABNORMAL LOW (ref 13.0–17.7)
MCH: 29.9 pg (ref 26.6–33.0)
MCHC: 32 g/dL (ref 31.5–35.7)
MCV: 94 fL (ref 79–97)
PLATELETS: 203 10*3/uL (ref 150–450)
RBC: 4.01 x10E6/uL — ABNORMAL LOW (ref 4.14–5.80)
RDW: 16 % — ABNORMAL HIGH (ref 12.3–15.4)
WBC: 5.6 10*3/uL (ref 3.4–10.8)

## 2018-04-14 LAB — BASIC METABOLIC PANEL
BUN / CREAT RATIO: 11 (ref 10–24)
BUN: 13 mg/dL (ref 8–27)
CHLORIDE: 104 mmol/L (ref 96–106)
CO2: 24 mmol/L (ref 20–29)
Calcium: 9.6 mg/dL (ref 8.6–10.2)
Creatinine, Ser: 1.14 mg/dL (ref 0.76–1.27)
GFR, EST AFRICAN AMERICAN: 69 mL/min/{1.73_m2} (ref >59)
GFR, EST NON AFRICAN AMERICAN: 60 mL/min/{1.73_m2} (ref >59)
Glucose: 75 mg/dL (ref 65–99)
Potassium: 4.2 mmol/L (ref 3.5–5.2)
Sodium: 144 mmol/L (ref 134–144)

## 2018-04-14 LAB — PRO B NATRIURETIC PEPTIDE: NT-Pro BNP: 2595 pg/mL — ABNORMAL HIGH (ref 0–486)

## 2018-04-16 ENCOUNTER — Other Ambulatory Visit: Payer: Self-pay

## 2018-04-16 DIAGNOSIS — Z7982 Long term (current) use of aspirin: Secondary | ICD-10-CM | POA: Diagnosis not present

## 2018-04-16 DIAGNOSIS — I4891 Unspecified atrial fibrillation: Secondary | ICD-10-CM | POA: Diagnosis not present

## 2018-04-16 DIAGNOSIS — I10 Essential (primary) hypertension: Secondary | ICD-10-CM

## 2018-04-16 DIAGNOSIS — I252 Old myocardial infarction: Secondary | ICD-10-CM | POA: Diagnosis not present

## 2018-04-16 DIAGNOSIS — Z955 Presence of coronary angioplasty implant and graft: Secondary | ICD-10-CM | POA: Diagnosis not present

## 2018-04-16 DIAGNOSIS — E785 Hyperlipidemia, unspecified: Secondary | ICD-10-CM | POA: Diagnosis not present

## 2018-04-16 MED ORDER — LISINOPRIL 2.5 MG PO TABS: 2.5000 mg | ORAL_TABLET | Freq: Every day | ORAL | 2 refills | Status: DC

## 2018-04-18 DIAGNOSIS — Z7982 Long term (current) use of aspirin: Secondary | ICD-10-CM | POA: Diagnosis not present

## 2018-04-18 DIAGNOSIS — Z955 Presence of coronary angioplasty implant and graft: Secondary | ICD-10-CM | POA: Diagnosis not present

## 2018-04-18 DIAGNOSIS — I4891 Unspecified atrial fibrillation: Secondary | ICD-10-CM | POA: Diagnosis not present

## 2018-04-18 DIAGNOSIS — E785 Hyperlipidemia, unspecified: Secondary | ICD-10-CM | POA: Diagnosis not present

## 2018-04-18 DIAGNOSIS — I252 Old myocardial infarction: Secondary | ICD-10-CM | POA: Diagnosis not present

## 2018-04-18 DIAGNOSIS — I10 Essential (primary) hypertension: Secondary | ICD-10-CM | POA: Diagnosis not present

## 2018-04-20 ENCOUNTER — Other Ambulatory Visit: Payer: Self-pay

## 2018-04-20 ENCOUNTER — Telehealth: Payer: Self-pay | Admitting: Cardiology

## 2018-04-20 DIAGNOSIS — R233 Spontaneous ecchymoses: Secondary | ICD-10-CM | POA: Diagnosis not present

## 2018-04-20 DIAGNOSIS — E785 Hyperlipidemia, unspecified: Secondary | ICD-10-CM | POA: Diagnosis not present

## 2018-04-20 DIAGNOSIS — I252 Old myocardial infarction: Secondary | ICD-10-CM | POA: Diagnosis not present

## 2018-04-20 DIAGNOSIS — I4891 Unspecified atrial fibrillation: Secondary | ICD-10-CM | POA: Diagnosis not present

## 2018-04-20 DIAGNOSIS — I10 Essential (primary) hypertension: Secondary | ICD-10-CM

## 2018-04-20 DIAGNOSIS — L821 Other seborrheic keratosis: Secondary | ICD-10-CM | POA: Diagnosis not present

## 2018-04-20 DIAGNOSIS — Z955 Presence of coronary angioplasty implant and graft: Secondary | ICD-10-CM | POA: Diagnosis not present

## 2018-04-20 DIAGNOSIS — L570 Actinic keratosis: Secondary | ICD-10-CM | POA: Diagnosis not present

## 2018-04-20 DIAGNOSIS — Z7982 Long term (current) use of aspirin: Secondary | ICD-10-CM | POA: Diagnosis not present

## 2018-04-20 DIAGNOSIS — I7090 Unspecified atherosclerosis: Secondary | ICD-10-CM

## 2018-04-20 DIAGNOSIS — L578 Other skin changes due to chronic exposure to nonionizing radiation: Secondary | ICD-10-CM | POA: Diagnosis not present

## 2018-04-20 LAB — BASIC METABOLIC PANEL
BUN/Creatinine Ratio: 14 (ref 10–24)
BUN: 18 mg/dL (ref 8–27)
CALCIUM: 9.4 mg/dL (ref 8.6–10.2)
CHLORIDE: 101 mmol/L (ref 96–106)
CO2: 27 mmol/L (ref 20–29)
Creatinine, Ser: 1.3 mg/dL — ABNORMAL HIGH (ref 0.76–1.27)
GFR, EST AFRICAN AMERICAN: 59 mL/min/{1.73_m2} — AB (ref >59)
GFR, EST NON AFRICAN AMERICAN: 51 mL/min/{1.73_m2} — AB (ref >59)
Glucose: 188 mg/dL — ABNORMAL HIGH (ref 65–99)
POTASSIUM: 4.5 mmol/L (ref 3.5–5.2)
SODIUM: 141 mmol/L (ref 134–144)

## 2018-04-20 NOTE — Telephone Encounter (Signed)
I do not see in your last note it mentioned of having a carotid US? Please advise.

## 2018-04-20 NOTE — Telephone Encounter (Signed)
Yes, lets do cartic U/S

## 2018-04-20 NOTE — Telephone Encounter (Signed)
Okay order has been placed and ready for scheduling. Thanks :)

## 2018-04-20 NOTE — Telephone Encounter (Signed)
Patients daughter states that Dr Raliegh Ip mentioned him having a carotid Ultrasound. Please follow up with daughter as to what to do.

## 2018-04-23 DIAGNOSIS — E785 Hyperlipidemia, unspecified: Secondary | ICD-10-CM | POA: Diagnosis not present

## 2018-04-23 DIAGNOSIS — Z955 Presence of coronary angioplasty implant and graft: Secondary | ICD-10-CM | POA: Diagnosis not present

## 2018-04-23 DIAGNOSIS — I10 Essential (primary) hypertension: Secondary | ICD-10-CM | POA: Diagnosis not present

## 2018-04-23 DIAGNOSIS — Z7982 Long term (current) use of aspirin: Secondary | ICD-10-CM | POA: Diagnosis not present

## 2018-04-23 DIAGNOSIS — I252 Old myocardial infarction: Secondary | ICD-10-CM | POA: Diagnosis not present

## 2018-04-23 DIAGNOSIS — I4891 Unspecified atrial fibrillation: Secondary | ICD-10-CM | POA: Diagnosis not present

## 2018-04-25 DIAGNOSIS — I252 Old myocardial infarction: Secondary | ICD-10-CM | POA: Diagnosis not present

## 2018-04-25 DIAGNOSIS — I4891 Unspecified atrial fibrillation: Secondary | ICD-10-CM | POA: Diagnosis not present

## 2018-04-25 DIAGNOSIS — Z955 Presence of coronary angioplasty implant and graft: Secondary | ICD-10-CM | POA: Diagnosis not present

## 2018-04-25 DIAGNOSIS — Z7982 Long term (current) use of aspirin: Secondary | ICD-10-CM | POA: Diagnosis not present

## 2018-04-25 DIAGNOSIS — I10 Essential (primary) hypertension: Secondary | ICD-10-CM | POA: Diagnosis not present

## 2018-04-25 DIAGNOSIS — E785 Hyperlipidemia, unspecified: Secondary | ICD-10-CM | POA: Diagnosis not present

## 2018-04-26 ENCOUNTER — Encounter: Payer: Self-pay | Admitting: Cardiology

## 2018-04-26 ENCOUNTER — Ambulatory Visit (INDEPENDENT_AMBULATORY_CARE_PROVIDER_SITE_OTHER): Payer: Medicare Other | Admitting: Cardiology

## 2018-04-26 VITALS — BP 128/62 | HR 50 | Ht 69.0 in | Wt 211.0 lb

## 2018-04-26 DIAGNOSIS — R0603 Acute respiratory distress: Secondary | ICD-10-CM | POA: Diagnosis not present

## 2018-04-26 DIAGNOSIS — R5381 Other malaise: Secondary | ICD-10-CM | POA: Diagnosis not present

## 2018-04-26 DIAGNOSIS — E785 Hyperlipidemia, unspecified: Secondary | ICD-10-CM

## 2018-04-26 DIAGNOSIS — I472 Ventricular tachycardia, unspecified: Secondary | ICD-10-CM

## 2018-04-26 DIAGNOSIS — I255 Ischemic cardiomyopathy: Secondary | ICD-10-CM

## 2018-04-26 DIAGNOSIS — I10 Essential (primary) hypertension: Secondary | ICD-10-CM | POA: Diagnosis not present

## 2018-04-26 DIAGNOSIS — R5383 Other fatigue: Secondary | ICD-10-CM | POA: Diagnosis not present

## 2018-04-26 DIAGNOSIS — I43 Cardiomyopathy in diseases classified elsewhere: Secondary | ICD-10-CM | POA: Diagnosis not present

## 2018-04-26 NOTE — Patient Instructions (Signed)
Medication Instructions:  Your physician recommends that you continue on your current medications as directed. Please refer to the Current Medication list given to you today.   Labwork: Your physician recommends that you return for lab work in: BMP and BNP.   Testing/Procedures: None  Follow-Up: Your physician recommends that you schedule a follow-up appointment in:  6 weeks   Any Other Special Instructions Will Be Listed Below (If Applicable).     If you need a refill on your cardiac medications before your next appointment, please call your pharmacy.

## 2018-04-26 NOTE — Progress Notes (Signed)
Cardiology Office Note:    Date:  04/26/2018   ID:  Daryl Rodriguez, DOB [birth year], MRN [id number]  PCP:  Nicoletta Dress, MD  Cardiologist:  [name], MD    Referring MD: Nicoletta Dress, MD   No chief complaint on file. Doing very well  History of Present Illness:    Daryl Rodriguez is a 84 y.o. male with very complex past medical history which include coronary artery disease status post recent myocardial infarction.  Also ICD implant.  History of ventricular tachycardia.  He comes today to office for follow-up and have told me he is doing very well he goes to rehab on the regular basis when he seems to be doing very well there.  No chest pain tightness squeezing pressure burning chest no swelling of lower extremities no dizziness no passing out no discharges from ICD.  Past Medical History:  Diagnosis Date  . Acute respiratory failure ([place])   . BPH (benign prostatic hyperplasia)   . Cardiac arrest ([place])   . Carpal tunnel syndrome   . CKD (chronic kidney disease)   . Diabetes ([place])   . DM II (diabetes mellitus, type II), controlled ([place])   . High risk medication use   . Hyperlipidemia   . Hypertension   . Low vitamin D level   . STEMI (ST elevation myocardial infarction) ([place])    01/22/18 PCI/DES to RCA  . Systolic heart failure ([place])   . VT (ventricular tachycardia) ([place])     Past Surgical History:  Procedure Laterality Date  . BACK SURGERY    . CORONARY STENT INTERVENTION N/A 01/22/2018   Procedure: CORONARY STENT INTERVENTION;  Surgeon: Troy Sine, MD;  Location: [facility];  Service: Cardiovascular;  Laterality: N/A;  . CORONARY/GRAFT ACUTE MI REVASCULARIZATION N/A 01/22/2018   Procedure: Coronary/Graft Acute MI Revascularization;  Surgeon: Troy Sine, MD;  Location: [facility];  Service: Cardiovascular;  Laterality: N/A;  . ICD IMPLANT N/A 01/30/2018   Procedure: ICD IMPLANT;  Surgeon: Evans Lance, MD;  Location: [facility];  Service: Cardiovascular;  Laterality: N/A;  . LEFT HEART CATH AND CORONARY ANGIOGRAPHY N/A 01/22/2018   Procedure: LEFT HEART CATH AND CORONARY ANGIOGRAPHY;  Surgeon: Troy Sine, MD;  Location: [facility];  Service: Cardiovascular;  Laterality: N/A;  . LEFT HEART CATH AND CORONARY ANGIOGRAPHY N/A 01/25/2018   Procedure: LEFT HEART CATH AND CORONARY ANGIOGRAPHY - RELOOK;  Surgeon: Wellington Hampshire, MD;  Location: [facility];  Service: Cardiovascular;  Laterality: N/A;  . WRIST SURGERY      Current Medications: Current Meds  Medication Sig  . amiodarone (PACERONE) 200 MG tablet Take 1 tablet (200 mg total) by mouth daily.  Marland Kitchen amLODipine (NORVASC) 5 MG tablet Take 1 tablet (5 mg total) by mouth daily.  Marland Kitchen ammonium lactate (LAC-HYDRIN) 12 % lotion Apply 1 application topically 2 (two) times daily. Arms legs and hands  . aspirin EC 81 MG tablet Take 81 mg by mouth daily.  Marland Kitchen atorvastatin (LIPITOR) 80 MG tablet Take 1 tablet (80 mg total) by mouth daily at 6 PM.  . clopidogrel (PLAVIX) 75 MG tablet Take 1 tablet (75 mg total) by mouth daily.  . Cyanocobalamin (VITAMIN B 12 PO) Take 500 mcg by mouth daily.  Marland Kitchen FERREX 150 150 MG capsule TAKE 1 CAPSULE BY MOUTH ONCE DAILY  . furosemide (LASIX) 20 MG tablet Take 1 tablet (20 mg total) by mouth daily. (  Patient taking differently: Take 40 mg by mouth 2 (two) times daily. )  . gabapentin (NEURONTIN) 300 MG capsule Take 1 capsule by mouth 2 (two) times daily.  Marland Kitchen glimepiride (AMARYL) 4 MG tablet Take 4 mg by mouth daily.  . isosorbide mononitrate (IMDUR) 30 MG 24 hr tablet TAKE 1 TABLET BY MOUTH ONCE DAILY  . latanoprost (XALATAN) 0.005 % ophthalmic solution Place 1 drop into both eyes at bedtime.  Marland Kitchen lisinopril (PRINIVIL,ZESTRIL) 2.5 MG tablet Take 1 tablet (2.5 mg total) by mouth daily.  . metoprolol tartrate (LOPRESSOR) 50 MG tablet Take 50 mg by mouth 2 (two) times daily.  . nitroGLYCERIN (NITROSTAT) 0.4 MG SL tablet Place 1  tablet (0.4 mg total) under the tongue every 5 (five) minutes x 3 doses as needed for chest pain.  . potassium chloride SA (KLOR-CON M20) 20 MEQ tablet Take 1 tablet (20 mEq total) by mouth daily.  Marland Kitchen terazosin (HYTRIN) 1 MG capsule Take 1 mg by mouth daily.  Marland Kitchen triamcinolone cream (KENALOG) 0.1 % Apply 1 application topically as needed (Itchy skin).   . Vitamin D, Ergocalciferol, (DRISDOL) 50000 units CAPS capsule Take 50,000 Units by mouth once a week. Thursdays     Allergies:   Patient has no known allergies.   Social History   Socioeconomic History  . Marital status: Married    Spouse name: Not on file  . Number of children: Not on file  . Years of education: Not on file  . Highest education level: Not on file  Occupational History  . Not on file  Social Needs  . Financial resource strain: Not on file  . Food insecurity:    Worry: Not on file    Inability: Not on file  . Transportation needs:    Medical: Not on file    Non-medical: Not on file  Tobacco Use  . Smoking status: Former Research scientist (life sciences)  . Smokeless tobacco: Never Used  . Tobacco comment: quit smoking 50 years ago  Substance and Sexual Activity  . Alcohol use: Not Currently  . Drug use: Not Currently  . Sexual activity: Not on file  Lifestyle  . Physical activity:    Days per week: Not on file    Minutes per session: Not on file  . Stress: Not on file  Relationships  . Social connections:    Talks on phone: Not on file    Gets together: Not on file    Attends religious service: Not on file    Active member of club or organization: Not on file    Attends meetings of clubs or organizations: Not on file    Relationship status: Not on file  Other Topics Concern  . Not on file  Social History Narrative  . Not on file     Family History: The patient's family history includes Heart attack in his mother; Heart attack (age of onset: 25) in his father; Stroke in his sister. ROS:   Please see the history of present  illness.    All 14 point review of systems negative except as described per history of present illness  EKGs/Labs/Other Studies Reviewed:      Recent Labs: 01/22/2018: B Natriuretic Peptide 695.6 01/23/2018: ALT 30 01/30/2018: Magnesium 2.0 04/13/2018: Hemoglobin 12.0; NT-Pro BNP 2,595; Platelets 203 04/20/2018: BUN 18; Creatinine, Ser 1.30; Potassium 4.5; Sodium 141  Recent Lipid Panel    Component Value Date/Time   CHOL 126 01/22/2018 1632   TRIG 75 01/22/2018 1632   HDL  43 01/22/2018 1632   CHOLHDL 2.9 01/22/2018 1632   VLDL 15 01/22/2018 1632   LDLCALC 68 01/22/2018 1632    Physical Exam:    VS:  BP 128/62 (BP Location: Right Arm, Patient Position: Sitting, Cuff Size: Normal)   Pulse (!) 50   Ht 5\' 9"  (1.753 m)   Wt 211 lb (95.7 kg)   SpO2 95%   BMI 31.16 kg/m     Wt Readings from Last 3 Encounters:  04/26/18 211 lb (95.7 kg)  04/13/18 208 lb (94.3 kg)  03/13/18 218 lb (98.9 kg)     GEN:  Well nourished, well developed in no acute distress HEENT: Normal NECK: No JVD; No carotid bruits LYMPHATICS: No lymphadenopathy CARDIAC: RRR, no murmurs, no rubs, no gallops RESPIRATORY:  Clear to auscultation without rales, wheezing or rhonchi  ABDOMEN: Soft, non-tender, non-distended MUSCULOSKELETAL:  No edema; No deformity  SKIN: Warm and dry LOWER EXTREMITIES: no swelling NEUROLOGIC:  Alert and oriented x 3 PSYCHIATRIC:  Normal affect   ASSESSMENT:    1. Ischemic cardiomyopathy   2. Cardiomyopathy as manifestation of underlying disease ([place])   3. Respiratory distress   4. Essential hypertension   5. Dyslipidemia   6. V tach ([place])    PLAN:    In order of problems listed above:  1. Ischemic cardiomyopathy on appropriate medications.  I will check his Chem-7 today to see if I will be able to increase ACE inhibitor.  Overall he seems to be doing very well 2. Essential hypertension blood pressure well controlled continue present medications. 3. Dyslipidemia he is on  statin which I will continue. 4. History of V. tach on amiodarone ICD present.  No recent discharges.  Overall I am very pleased the way he feels and looks I see him back in my office in about 6 weeks to 2 months.   Medication Adjustments/Labs and Tests Ordered: Current medicines are reviewed at length with the patient today.  Concerns regarding medicines are outlined above.  No orders of the defined types were placed in this encounter.  Medication changes: No orders of the defined types were placed in this encounter.   Signed, Park Liter, MD, [facility] 04/26/2018 12:57 PM    [place]

## 2018-04-27 DIAGNOSIS — I252 Old myocardial infarction: Secondary | ICD-10-CM | POA: Diagnosis not present

## 2018-04-27 DIAGNOSIS — I4891 Unspecified atrial fibrillation: Secondary | ICD-10-CM | POA: Diagnosis not present

## 2018-04-27 DIAGNOSIS — Z955 Presence of coronary angioplasty implant and graft: Secondary | ICD-10-CM | POA: Diagnosis not present

## 2018-04-27 DIAGNOSIS — E785 Hyperlipidemia, unspecified: Secondary | ICD-10-CM | POA: Diagnosis not present

## 2018-04-27 DIAGNOSIS — I10 Essential (primary) hypertension: Secondary | ICD-10-CM | POA: Diagnosis not present

## 2018-04-27 DIAGNOSIS — Z7982 Long term (current) use of aspirin: Secondary | ICD-10-CM | POA: Diagnosis not present

## 2018-04-27 LAB — BASIC METABOLIC PANEL
BUN/Creatinine Ratio: 15 (ref 10–24)
BUN: 18 mg/dL (ref 8–27)
CALCIUM: 9 mg/dL (ref 8.6–10.2)
CO2: 23 mmol/L (ref 20–29)
CREATININE: 1.22 mg/dL (ref 0.76–1.27)
Chloride: 101 mmol/L (ref 96–106)
GFR calc Af Amer: 64 mL/min/{1.73_m2} (ref >59)
GFR calc non Af Amer: 55 mL/min/{1.73_m2} — ABNORMAL LOW (ref >59)
Glucose: 142 mg/dL — ABNORMAL HIGH (ref 65–99)
Potassium: 4 mmol/L (ref 3.5–5.2)
Sodium: 141 mmol/L (ref 134–144)

## 2018-04-27 LAB — PRO B NATRIURETIC PEPTIDE: NT-Pro BNP: 1739 pg/mL — ABNORMAL HIGH (ref 0–486)

## 2018-04-30 ENCOUNTER — Other Ambulatory Visit: Payer: Self-pay

## 2018-04-30 ENCOUNTER — Ambulatory Visit: Payer: Medicare Other | Admitting: Cardiology

## 2018-04-30 ENCOUNTER — Telehealth: Payer: Self-pay | Admitting: Cardiology

## 2018-04-30 DIAGNOSIS — I10 Essential (primary) hypertension: Secondary | ICD-10-CM | POA: Diagnosis not present

## 2018-04-30 DIAGNOSIS — Z955 Presence of coronary angioplasty implant and graft: Secondary | ICD-10-CM | POA: Diagnosis not present

## 2018-04-30 DIAGNOSIS — I252 Old myocardial infarction: Secondary | ICD-10-CM | POA: Diagnosis not present

## 2018-04-30 DIAGNOSIS — E785 Hyperlipidemia, unspecified: Secondary | ICD-10-CM | POA: Diagnosis not present

## 2018-04-30 DIAGNOSIS — Z7982 Long term (current) use of aspirin: Secondary | ICD-10-CM | POA: Diagnosis not present

## 2018-04-30 DIAGNOSIS — I4891 Unspecified atrial fibrillation: Secondary | ICD-10-CM | POA: Diagnosis not present

## 2018-04-30 MED ORDER — LISINOPRIL 5 MG PO TABS: 5.0000 mg | ORAL_TABLET | Freq: Every day | ORAL | 3 refills | Status: DC

## 2018-04-30 NOTE — Telephone Encounter (Signed)
Wants lab results

## 2018-04-30 NOTE — Telephone Encounter (Signed)
Informed of labs. Med change made accordingly and orders placed.

## 2018-05-02 DIAGNOSIS — Z7982 Long term (current) use of aspirin: Secondary | ICD-10-CM | POA: Diagnosis not present

## 2018-05-02 DIAGNOSIS — E785 Hyperlipidemia, unspecified: Secondary | ICD-10-CM | POA: Diagnosis not present

## 2018-05-02 DIAGNOSIS — I252 Old myocardial infarction: Secondary | ICD-10-CM | POA: Diagnosis not present

## 2018-05-02 DIAGNOSIS — I4891 Unspecified atrial fibrillation: Secondary | ICD-10-CM | POA: Diagnosis not present

## 2018-05-02 DIAGNOSIS — Z955 Presence of coronary angioplasty implant and graft: Secondary | ICD-10-CM | POA: Diagnosis not present

## 2018-05-02 DIAGNOSIS — I10 Essential (primary) hypertension: Secondary | ICD-10-CM | POA: Diagnosis not present

## 2018-05-04 ENCOUNTER — Ambulatory Visit: Payer: Medicare Other | Admitting: Internal Medicine

## 2018-05-04 DIAGNOSIS — I4891 Unspecified atrial fibrillation: Secondary | ICD-10-CM | POA: Diagnosis not present

## 2018-05-04 DIAGNOSIS — E785 Hyperlipidemia, unspecified: Secondary | ICD-10-CM | POA: Diagnosis not present

## 2018-05-04 DIAGNOSIS — Z955 Presence of coronary angioplasty implant and graft: Secondary | ICD-10-CM | POA: Diagnosis not present

## 2018-05-04 DIAGNOSIS — Z7982 Long term (current) use of aspirin: Secondary | ICD-10-CM | POA: Diagnosis not present

## 2018-05-04 DIAGNOSIS — I252 Old myocardial infarction: Secondary | ICD-10-CM | POA: Diagnosis not present

## 2018-05-04 DIAGNOSIS — I10 Essential (primary) hypertension: Secondary | ICD-10-CM | POA: Diagnosis not present

## 2018-05-04 LAB — BASIC METABOLIC PANEL
BUN / CREAT RATIO: 13 (ref 10–24)
BUN: 15 mg/dL (ref 8–27)
CO2: 27 mmol/L (ref 20–29)
CREATININE: 1.19 mg/dL (ref 0.76–1.27)
Calcium: 9.1 mg/dL (ref 8.6–10.2)
Chloride: 101 mmol/L (ref 96–106)
GFR calc Af Amer: 65 mL/min/{1.73_m2} (ref >59)
GFR, EST NON AFRICAN AMERICAN: 57 mL/min/{1.73_m2} — AB (ref >59)
Glucose: 181 mg/dL — ABNORMAL HIGH (ref 65–99)
Potassium: 4.4 mmol/L (ref 3.5–5.2)
SODIUM: 139 mmol/L (ref 134–144)

## 2018-05-09 ENCOUNTER — Encounter: Payer: Self-pay | Admitting: Internal Medicine

## 2018-05-09 ENCOUNTER — Ambulatory Visit (INDEPENDENT_AMBULATORY_CARE_PROVIDER_SITE_OTHER): Payer: Medicare Other | Admitting: Internal Medicine

## 2018-05-09 VITALS — BP 128/70 | HR 50 | Ht 69.0 in | Wt 212.4 lb

## 2018-05-09 DIAGNOSIS — I129 Hypertensive chronic kidney disease with stage 1 through stage 4 chronic kidney disease, or unspecified chronic kidney disease: Secondary | ICD-10-CM | POA: Diagnosis not present

## 2018-05-09 DIAGNOSIS — E7800 Pure hypercholesterolemia, unspecified: Secondary | ICD-10-CM | POA: Diagnosis not present

## 2018-05-09 DIAGNOSIS — I429 Cardiomyopathy, unspecified: Secondary | ICD-10-CM

## 2018-05-09 DIAGNOSIS — R7989 Other specified abnormal findings of blood chemistry: Secondary | ICD-10-CM | POA: Diagnosis not present

## 2018-05-09 DIAGNOSIS — I472 Ventricular tachycardia, unspecified: Secondary | ICD-10-CM

## 2018-05-09 DIAGNOSIS — I255 Ischemic cardiomyopathy: Secondary | ICD-10-CM

## 2018-05-09 DIAGNOSIS — N400 Enlarged prostate without lower urinary tract symptoms: Secondary | ICD-10-CM | POA: Diagnosis not present

## 2018-05-09 DIAGNOSIS — Z95810 Presence of automatic (implantable) cardiac defibrillator: Secondary | ICD-10-CM | POA: Diagnosis not present

## 2018-05-09 DIAGNOSIS — E1122 Type 2 diabetes mellitus with diabetic chronic kidney disease: Secondary | ICD-10-CM | POA: Diagnosis not present

## 2018-05-09 MED ORDER — FUROSEMIDE 40 MG PO TABS: 40.0000 mg | ORAL_TABLET | Freq: Every day | ORAL | 3 refills | Status: AC

## 2018-05-09 NOTE — Patient Instructions (Addendum)
Medication Instructions:  Your physician has recommended you make the following change in your medication:  1.  Increase your lasix 40 mg- Take one tablet in the AM and 1/2 tablet after lunch for 2 days. Then return to one tablet 40 mg by mouth daily.  Labwork: None ordered.  Testing/Procedures: None ordered.  Follow-Up: Your physician wants you to follow-up in: 9 months with Dr. Lovena Le.  You will receive a reminder letter in the mail two months in advance. If you don't receive a letter, please call our office to schedule the follow-up appointment.  Remote monitoring is used to monitor your ICD from home. This monitoring reduces the number of office visits required to check your device to one time per year. It allows [place] to keep an eye on the functioning of your device to ensure it is working properly. You are scheduled for a device check from home on 08/08/2018. You may send your transmission at any time that day. If you have a wireless device, the transmission will be sent automatically. After your physician reviews your transmission, you will receive a postcard with your next transmission date.  Any Other Special Instructions Will Be Listed Below (If Applicable).  If you need a refill on your cardiac medications before your next appointment, please call your pharmacy.

## 2018-05-09 NOTE — Progress Notes (Signed)
HPI Daryl Rodriguez returns today for followup. He is a pleasant 84 yo man with a h/o an ICM, s/p ICD insertion. He has done well in the interim except for an episode of pneumonia. He is frustrated by his not being able to drive.  No Known Allergies   Current Outpatient Medications  Medication Sig Dispense Refill  . amiodarone (PACERONE) 200 MG tablet Take 1 tablet (200 mg total) by mouth daily. 180 tablet 1  . amLODipine (NORVASC) 5 MG tablet Take 1 tablet (5 mg total) by mouth daily. 180 tablet 3  . ammonium lactate (LAC-HYDRIN) 12 % lotion Apply 1 application topically 2 (two) times daily. Arms legs and hands    . aspirin EC 81 MG tablet Take 81 mg by mouth daily.    Marland Kitchen atorvastatin (LIPITOR) 80 MG tablet Take 1 tablet (80 mg total) by mouth daily at 6 PM. 90 tablet 1  . clopidogrel (PLAVIX) 75 MG tablet Take 1 tablet (75 mg total) by mouth daily. 90 tablet 3  . Cyanocobalamin (VITAMIN B 12 PO) Take 500 mcg by mouth daily.    Marland Kitchen FERREX 150 150 MG capsule TAKE 1 CAPSULE BY MOUTH ONCE DAILY 90 capsule 3  . furosemide (LASIX) 40 MG tablet Take 40 mg by mouth.    . gabapentin (NEURONTIN) 300 MG capsule Take 1 capsule by mouth 2 (two) times daily.  1  . glimepiride (AMARYL) 4 MG tablet Take 4 mg by mouth daily.    . isosorbide mononitrate (IMDUR) 30 MG 24 hr tablet TAKE 1 TABLET BY MOUTH ONCE DAILY 90 tablet 0  . latanoprost (XALATAN) 0.005 % ophthalmic solution Place 1 drop into both eyes at bedtime.    Marland Kitchen lisinopril (PRINIVIL,ZESTRIL) 5 MG tablet Take 1 tablet (5 mg total) by mouth daily. 90 tablet 3  . metoprolol tartrate (LOPRESSOR) 50 MG tablet Take 50 mg by mouth 2 (two) times daily.    . nitroGLYCERIN (NITROSTAT) 0.4 MG SL tablet Place 1 tablet (0.4 mg total) under the tongue every 5 (five) minutes x 3 doses as needed for chest pain. 25 tablet 1  . potassium chloride SA (KLOR-CON M20) 20 MEQ tablet Take 1 tablet (20 mEq total) by mouth daily. 30 tablet 3  . terazosin (HYTRIN) 1 MG  capsule Take 1 mg by mouth daily.    Marland Kitchen triamcinolone cream (KENALOG) 0.1 % Apply 1 application topically as needed (Itchy skin).     . Vitamin D, Ergocalciferol, (DRISDOL) 50000 units CAPS capsule Take 50,000 Units by mouth once a week. Thursdays     No current facility-administered medications for this visit.      Past Medical History:  Diagnosis Date  . Acute respiratory failure ([place])   . BPH (benign prostatic hyperplasia)   . Cardiac arrest ([place])   . Carpal tunnel syndrome   . CKD (chronic kidney disease)   . Diabetes ([place])   . DM II (diabetes mellitus, type II), controlled ([place])   . High risk medication use   . Hyperlipidemia   . Hypertension   . Low vitamin D level   . STEMI (ST elevation myocardial infarction) ([place])    01/22/18 PCI/DES to RCA  . Systolic heart failure ([place])   . VT (ventricular tachycardia) ([facility])     ROS:   All systems reviewed and negative except as noted in the HPI.   Past Surgical History:  Procedure Laterality Date  . BACK SURGERY    . CORONARY STENT INTERVENTION  N/A 01/22/2018   Procedure: CORONARY STENT INTERVENTION;  Surgeon: Troy Sine, MD;  Location: [facility];  Service: Cardiovascular;  Laterality: N/A;  . CORONARY/GRAFT ACUTE MI REVASCULARIZATION N/A 01/22/2018   Procedure: Coronary/Graft Acute MI Revascularization;  Surgeon: Troy Sine, MD;  Location: [facility];  Service: Cardiovascular;  Laterality: N/A;  . ICD IMPLANT N/A 01/30/2018   Procedure: ICD IMPLANT;  Surgeon: Evans Lance, MD;  Location: [facility];  Service: Cardiovascular;  Laterality: N/A;  . LEFT HEART CATH AND CORONARY ANGIOGRAPHY N/A 01/22/2018   Procedure: LEFT HEART CATH AND CORONARY ANGIOGRAPHY;  Surgeon: Troy Sine, MD;  Location: [facility];  Service: Cardiovascular;  Laterality: N/A;  . LEFT HEART CATH AND CORONARY ANGIOGRAPHY N/A 01/25/2018   Procedure: LEFT HEART CATH AND CORONARY ANGIOGRAPHY - RELOOK;  Surgeon: Wellington Hampshire, MD;  Location: [facility];  Service: Cardiovascular;  Laterality: N/A;  . WRIST SURGERY       Family History  Problem Relation Age of Onset  . Heart attack Father 61  . Heart attack Mother   . Stroke Sister      Social History   Socioeconomic History  . Marital status: Married    Spouse name: Not on file  . Number of children: Not on file  . Years of education: Not on file  . Highest education level: Not on file  Occupational History  . Not on file  Social Needs  . Financial resource strain: Not on file  . Food insecurity:    Worry: Not on file    Inability: Not on file  . Transportation needs:    Medical: Not on file    Non-medical: Not on file  Tobacco Use  . Smoking status: Former Research scientist (life sciences)  . Smokeless tobacco: Never Used  . Tobacco comment: quit smoking 50 years ago  Substance and Sexual Activity  . Alcohol use: Not Currently  . Drug use: Not Currently  . Sexual activity: Not on file  Lifestyle  . Physical activity:    Days per week: Not on file    Minutes per session: Not on file  . Stress: Not on file  Relationships  . Social connections:    Talks on phone: Not on file    Gets together: Not on file    Attends religious service: Not on file    Active member of club or organization: Not on file    Attends meetings of clubs or organizations: Not on file    Relationship status: Not on file  . Intimate partner violence:    Fear of current or ex partner: Not on file    Emotionally abused: Not on file    Physically abused: Not on file    Forced sexual activity: Not on file  Other Topics Concern  . Not on file  Social History Narrative  . Not on file     BP 128/70   Pulse (!) 50   Ht 5\' 9"  (1.753 m)   Wt 212 lb 6.4 oz (96.3 kg)   SpO2 97%   BMI 31.37 kg/m   Physical Exam:  Well appearing NAD HEENT: Unremarkable Neck:  No JVD, no thyromegally Lymphatics:  No adenopathy Back:  No CVA tenderness Lungs:  Clear with no  wheezes HEART:  Regular rate rhythm, no murmurs, no rubs, no clicks Abd:  soft, positive bowel sounds, no organomegally, no rebound, no guarding Ext:  2 plus pulses,  no edema, no cyanosis, no clubbing Skin:  No rashes no nodules Neuro:  CN II through XII intact, motor grossly intact  EKG - sinus brady with first degree AV block  DEVICE  Normal device function.  See PaceArt for details.   Assess/Plan: 1. VT - he has had no recurrent VT. He will continue his amiodarone. I would anticipate reducing the dose when I see him back in 9 months.  2. ICD - his [employer] single chamber ICD is working normally.  3. CAD - he is s/p MI. He has been active and denies any anginal symptoms. 4. Chronic systolic heart failure - his fluid index has been up for the past 2 weeks. I have asked him to take some extra lasix for the next 2 days.  [name].D.

## 2018-05-11 DIAGNOSIS — I10 Essential (primary) hypertension: Secondary | ICD-10-CM | POA: Diagnosis not present

## 2018-05-11 DIAGNOSIS — I252 Old myocardial infarction: Secondary | ICD-10-CM | POA: Diagnosis not present

## 2018-05-11 DIAGNOSIS — Z79899 Other long term (current) drug therapy: Secondary | ICD-10-CM | POA: Diagnosis not present

## 2018-05-11 DIAGNOSIS — Z955 Presence of coronary angioplasty implant and graft: Secondary | ICD-10-CM | POA: Diagnosis not present

## 2018-05-11 DIAGNOSIS — I4891 Unspecified atrial fibrillation: Secondary | ICD-10-CM | POA: Diagnosis not present

## 2018-05-11 DIAGNOSIS — E785 Hyperlipidemia, unspecified: Secondary | ICD-10-CM | POA: Diagnosis not present

## 2018-05-11 DIAGNOSIS — Z7982 Long term (current) use of aspirin: Secondary | ICD-10-CM | POA: Diagnosis not present

## 2018-05-14 ENCOUNTER — Telehealth: Payer: Self-pay | Admitting: Cardiology

## 2018-05-14 DIAGNOSIS — Z7982 Long term (current) use of aspirin: Secondary | ICD-10-CM | POA: Diagnosis not present

## 2018-05-14 DIAGNOSIS — I252 Old myocardial infarction: Secondary | ICD-10-CM | POA: Diagnosis not present

## 2018-05-14 DIAGNOSIS — E785 Hyperlipidemia, unspecified: Secondary | ICD-10-CM | POA: Diagnosis not present

## 2018-05-14 DIAGNOSIS — I4891 Unspecified atrial fibrillation: Secondary | ICD-10-CM | POA: Diagnosis not present

## 2018-05-14 DIAGNOSIS — Z955 Presence of coronary angioplasty implant and graft: Secondary | ICD-10-CM | POA: Diagnosis not present

## 2018-05-14 DIAGNOSIS — I10 Essential (primary) hypertension: Secondary | ICD-10-CM | POA: Diagnosis not present

## 2018-05-14 MED ORDER — CLOPIDOGREL BISULFATE 75 MG PO TABS: 75.0000 mg | ORAL_TABLET | Freq: Every day | ORAL | 3 refills | Status: DC

## 2018-05-14 NOTE — Addendum Note (Signed)
Addended by: Stevan Born on: 05/14/2018 01:59 PM   Modules accepted: Orders

## 2018-05-14 NOTE — Telephone Encounter (Signed)
Wants clopidogrel called to [facility] in [place]-wants all scripts called to [place] from now on

## 2018-05-14 NOTE — Telephone Encounter (Signed)
Rx sent to pharmacy as requested.

## 2018-05-16 DIAGNOSIS — Z7982 Long term (current) use of aspirin: Secondary | ICD-10-CM | POA: Diagnosis not present

## 2018-05-16 DIAGNOSIS — Z955 Presence of coronary angioplasty implant and graft: Secondary | ICD-10-CM | POA: Diagnosis not present

## 2018-05-16 DIAGNOSIS — I4891 Unspecified atrial fibrillation: Secondary | ICD-10-CM | POA: Diagnosis not present

## 2018-05-16 DIAGNOSIS — I10 Essential (primary) hypertension: Secondary | ICD-10-CM | POA: Diagnosis not present

## 2018-05-16 DIAGNOSIS — E785 Hyperlipidemia, unspecified: Secondary | ICD-10-CM | POA: Diagnosis not present

## 2018-05-16 DIAGNOSIS — I252 Old myocardial infarction: Secondary | ICD-10-CM | POA: Diagnosis not present

## 2018-05-18 DIAGNOSIS — Z7982 Long term (current) use of aspirin: Secondary | ICD-10-CM | POA: Diagnosis not present

## 2018-05-18 DIAGNOSIS — I4891 Unspecified atrial fibrillation: Secondary | ICD-10-CM | POA: Diagnosis not present

## 2018-05-18 DIAGNOSIS — E785 Hyperlipidemia, unspecified: Secondary | ICD-10-CM | POA: Diagnosis not present

## 2018-05-18 DIAGNOSIS — Z955 Presence of coronary angioplasty implant and graft: Secondary | ICD-10-CM | POA: Diagnosis not present

## 2018-05-18 DIAGNOSIS — I10 Essential (primary) hypertension: Secondary | ICD-10-CM | POA: Diagnosis not present

## 2018-05-18 DIAGNOSIS — I252 Old myocardial infarction: Secondary | ICD-10-CM | POA: Diagnosis not present

## 2018-05-21 ENCOUNTER — Other Ambulatory Visit: Payer: Self-pay | Admitting: Cardiology

## 2018-05-21 MED ORDER — LISINOPRIL 5 MG PO TABS: 5.0000 mg | ORAL_TABLET | Freq: Every day | ORAL | 1 refills | Status: DC

## 2018-05-21 NOTE — Telephone Encounter (Signed)
Patients daughter said she needs a script for lisinipril 5mg  sent to [facility] in [place]. Doctor increased to 5mg  and only script pharmacy has is for 2.5mg .

## 2018-05-21 NOTE — Addendum Note (Signed)
Addended by: Ashok Norris on: 05/21/2018 01:21 PM   Modules accepted: Orders

## 2018-05-21 NOTE — Telephone Encounter (Signed)
Lisinopril 5 mg refilled.

## 2018-05-23 ENCOUNTER — Telehealth: Payer: Self-pay | Admitting: Cardiology

## 2018-05-23 ENCOUNTER — Other Ambulatory Visit: Payer: Self-pay

## 2018-05-23 DIAGNOSIS — E785 Hyperlipidemia, unspecified: Secondary | ICD-10-CM | POA: Diagnosis not present

## 2018-05-23 DIAGNOSIS — I10 Essential (primary) hypertension: Secondary | ICD-10-CM | POA: Diagnosis not present

## 2018-05-23 DIAGNOSIS — Z955 Presence of coronary angioplasty implant and graft: Secondary | ICD-10-CM | POA: Diagnosis not present

## 2018-05-23 DIAGNOSIS — I4891 Unspecified atrial fibrillation: Secondary | ICD-10-CM | POA: Diagnosis not present

## 2018-05-23 DIAGNOSIS — I252 Old myocardial infarction: Secondary | ICD-10-CM | POA: Diagnosis not present

## 2018-05-23 DIAGNOSIS — Z7982 Long term (current) use of aspirin: Secondary | ICD-10-CM | POA: Diagnosis not present

## 2018-05-23 MED ORDER — LISINOPRIL 5 MG PO TABS: 5.0000 mg | ORAL_TABLET | Freq: Every day | ORAL | 1 refills | Status: DC

## 2018-05-23 NOTE — Telephone Encounter (Signed)
Med refill has been sent. 

## 2018-05-23 NOTE — Telephone Encounter (Signed)
Patient called in script on 9/16 for lisinpril but has not been taken care of. Please refill.

## 2018-06-07 ENCOUNTER — Ambulatory Visit (INDEPENDENT_AMBULATORY_CARE_PROVIDER_SITE_OTHER): Payer: Medicare Other

## 2018-06-07 DIAGNOSIS — I7090 Unspecified atherosclerosis: Secondary | ICD-10-CM | POA: Diagnosis not present

## 2018-06-07 NOTE — Progress Notes (Signed)
Complete carotid duplex exam has been performed. Bilateral ICA/ECA stenosis was observed. Moderate calcific plaque seen throughout the CCA/ICA/ECA bilaterally.  Jimmy Osias Resnick, RDCS, RVT

## 2018-06-08 ENCOUNTER — Ambulatory Visit (INDEPENDENT_AMBULATORY_CARE_PROVIDER_SITE_OTHER): Payer: Medicare Other | Admitting: Cardiology

## 2018-06-08 ENCOUNTER — Encounter: Payer: Self-pay | Admitting: Cardiology

## 2018-06-08 VITALS — BP 140/60 | HR 59 | Ht 69.0 in | Wt 214.0 lb

## 2018-06-08 DIAGNOSIS — I255 Ischemic cardiomyopathy: Secondary | ICD-10-CM | POA: Diagnosis not present

## 2018-06-08 DIAGNOSIS — I10 Essential (primary) hypertension: Secondary | ICD-10-CM | POA: Diagnosis not present

## 2018-06-08 DIAGNOSIS — E785 Hyperlipidemia, unspecified: Secondary | ICD-10-CM | POA: Diagnosis not present

## 2018-06-08 DIAGNOSIS — I472 Ventricular tachycardia, unspecified: Secondary | ICD-10-CM

## 2018-06-08 DIAGNOSIS — I6523 Occlusion and stenosis of bilateral carotid arteries: Secondary | ICD-10-CM

## 2018-06-08 DIAGNOSIS — I2510 Atherosclerotic heart disease of native coronary artery without angina pectoris: Secondary | ICD-10-CM | POA: Diagnosis not present

## 2018-06-08 DIAGNOSIS — Z23 Encounter for immunization: Secondary | ICD-10-CM | POA: Diagnosis not present

## 2018-06-08 MED ORDER — LISINOPRIL 5 MG PO TABS: 10.0000 mg | ORAL_TABLET | Freq: Every day | ORAL | 1 refills | Status: DC

## 2018-06-08 MED ORDER — METOPROLOL SUCCINATE ER 100 MG PO TB24: 100.0000 mg | ORAL_TABLET | Freq: Every day | ORAL | 3 refills | Status: DC

## 2018-06-08 NOTE — Progress Notes (Signed)
Cardiology Office Note:    Date:  06/08/2018   ID:  Daryl Rodriguez, DOB 1934-01-21, MRN [id number]  PCP:  Nicoletta Dress, MD  Cardiologist:  [name], MD    Referring MD: Nicoletta Dress, MD   Chief Complaint  Patient presents with  . Follow up testing  Doing well  History of Present Illness:    Daryl Rodriguez is a 84 y.o. male with coronary artery disease, recent intervention.  Also cardiomyopathy with ejection fraction 4045%, ICD.  Overall doing well denies have any chest pain tightness squeezing pressure burning chest.  Described to have some swelling of lower extremities at evening time.  Otherwise seems to be doing well.  Past Medical History:  Diagnosis Date  . Acute respiratory failure ([place])   . BPH (benign prostatic hyperplasia)   . Cardiac arrest ([place])   . Carpal tunnel syndrome   . CKD (chronic kidney disease)   . Diabetes ([place])   . DM II (diabetes mellitus, type II), controlled ([place])   . High risk medication use   . Hyperlipidemia   . Hypertension   . Low vitamin D level   . STEMI (ST elevation myocardial infarction) ([place])    01/22/18 PCI/DES to RCA  . Systolic heart failure ([place])   . VT (ventricular tachycardia) ([place])     Past Surgical History:  Procedure Laterality Date  . BACK SURGERY    . CORONARY STENT INTERVENTION N/A 01/22/2018   Procedure: CORONARY STENT INTERVENTION;  Surgeon: Troy Sine, MD;  Location: [facility];  Service: Cardiovascular;  Laterality: N/A;  . CORONARY/GRAFT ACUTE MI REVASCULARIZATION N/A 01/22/2018   Procedure: Coronary/Graft Acute MI Revascularization;  Surgeon: Troy Sine, MD;  Location: [facility];  Service: Cardiovascular;  Laterality: N/A;  . ICD IMPLANT N/A 01/30/2018   Procedure: ICD IMPLANT;  Surgeon: Evans Lance, MD;  Location: [facility];  Service: Cardiovascular;  Laterality: N/A;  . LEFT HEART CATH AND CORONARY ANGIOGRAPHY N/A 01/22/2018   Procedure: LEFT HEART CATH AND  CORONARY ANGIOGRAPHY;  Surgeon: Troy Sine, MD;  Location: [facility];  Service: Cardiovascular;  Laterality: N/A;  . LEFT HEART CATH AND CORONARY ANGIOGRAPHY N/A 01/25/2018   Procedure: LEFT HEART CATH AND CORONARY ANGIOGRAPHY - RELOOK;  Surgeon: Wellington Hampshire, MD;  Location: [facility];  Service: Cardiovascular;  Laterality: N/A;  . WRIST SURGERY      Current Medications: Current Meds  Medication Sig  . amiodarone (PACERONE) 200 MG tablet Take 1 tablet (200 mg total) by mouth daily.  Marland Kitchen amLODipine (NORVASC) 5 MG tablet Take 1 tablet (5 mg total) by mouth daily.  Marland Kitchen ammonium lactate (LAC-HYDRIN) 12 % lotion Apply 1 application topically 2 (two) times daily. Arms legs and hands  . aspirin EC 81 MG tablet Take 81 mg by mouth daily.  Marland Kitchen atorvastatin (LIPITOR) 80 MG tablet Take 1 tablet (80 mg total) by mouth daily at 6 PM.  . clopidogrel (PLAVIX) 75 MG tablet Take 1 tablet (75 mg total) by mouth daily.  . Cyanocobalamin (VITAMIN B 12 PO) Take 500 mcg by mouth daily.  Marland Kitchen FERREX 150 150 MG capsule TAKE 1 CAPSULE BY MOUTH ONCE DAILY  . furosemide (LASIX) 40 MG tablet Take 1 tablet (40 mg total) by mouth daily.  Marland Kitchen gabapentin (NEURONTIN) 300 MG capsule Take 1 capsule by mouth 2 (two) times daily.  Marland Kitchen glimepiride (AMARYL) 4 MG tablet Take 4 mg by mouth daily.  Marland Kitchen  isosorbide mononitrate (IMDUR) 30 MG 24 hr tablet TAKE 1 TABLET BY MOUTH ONCE DAILY  . latanoprost (XALATAN) 0.005 % ophthalmic solution Place 1 drop into both eyes at bedtime.  Marland Kitchen lisinopril (PRINIVIL,ZESTRIL) 5 MG tablet Take 1 tablet (5 mg total) by mouth daily.  . metoprolol tartrate (LOPRESSOR) 50 MG tablet Take 50 mg by mouth 2 (two) times daily.  . nitroGLYCERIN (NITROSTAT) 0.4 MG SL tablet Place 1 tablet (0.4 mg total) under the tongue every 5 (five) minutes x 3 doses as needed for chest pain.  Marland Kitchen terazosin (HYTRIN) 1 MG capsule Take 1 mg by mouth daily.  Marland Kitchen triamcinolone cream (KENALOG) 0.1 % Apply 1 application  topically as needed (Itchy skin).   . Vitamin D, Ergocalciferol, (DRISDOL) 50000 units CAPS capsule Take 50,000 Units by mouth once a week. Thursdays     Allergies:   Patient has no known allergies.   Social History   Socioeconomic History  . Marital status: Married    Spouse name: Not on file  . Number of children: Not on file  . Years of education: Not on file  . Highest education level: Not on file  Occupational History  . Not on file  Social Needs  . Financial resource strain: Not on file  . Food insecurity:    Worry: Not on file    Inability: Not on file  . Transportation needs:    Medical: Not on file    Non-medical: Not on file  Tobacco Use  . Smoking status: Former Research scientist (life sciences)  . Smokeless tobacco: Never Used  . Tobacco comment: quit smoking 50 years ago  Substance and Sexual Activity  . Alcohol use: Not Currently  . Drug use: Not Currently  . Sexual activity: Not on file  Lifestyle  . Physical activity:    Days per week: Not on file    Minutes per session: Not on file  . Stress: Not on file  Relationships  . Social connections:    Talks on phone: Not on file    Gets together: Not on file    Attends religious service: Not on file    Active member of club or organization: Not on file    Attends meetings of clubs or organizations: Not on file    Relationship status: Not on file  Other Topics Concern  . Not on file  Social History Narrative  . Not on file     Family History: The patient's family history includes Heart attack in his mother; Heart attack (age of onset: 43) in his father; Stroke in his sister. ROS:   Please see the history of present illness.    All 14 point review of systems negative except as described per history of present illness  EKGs/Labs/Other Studies Reviewed:      Recent Labs: 01/22/2018: B Natriuretic Peptide 695.6 01/23/2018: ALT 30 01/30/2018: Magnesium 2.0 04/13/2018: Hemoglobin 12.0; Platelets 203 04/26/2018: NT-Pro BNP  1,739 05/04/2018: BUN 15; Creatinine, Ser 1.19; Potassium 4.4; Sodium 139  Recent Lipid Panel    Component Value Date/Time   CHOL 126 01/22/2018 1632   TRIG 75 01/22/2018 1632   HDL 43 01/22/2018 1632   CHOLHDL 2.9 01/22/2018 1632   VLDL 15 01/22/2018 1632   LDLCALC 68 01/22/2018 1632    Physical Exam:    VS:  BP 140/60   Pulse (!) 59   Ht 5\' 9"  (1.753 m)   Wt 214 lb (97.1 kg)   SpO2 98%   BMI 31.60 kg/m  Wt Readings from Last 3 Encounters:  06/08/18 214 lb (97.1 kg)  05/09/18 212 lb 6.4 oz (96.3 kg)  04/26/18 211 lb (95.7 kg)     GEN:  Well nourished, well developed in no acute distress HEENT: Normal NECK: No JVD; No carotid bruits LYMPHATICS: No lymphadenopathy CARDIAC: RRR, no murmurs, no rubs, no gallops RESPIRATORY:  Clear to auscultation without rales, wheezing or rhonchi  ABDOMEN: Soft, non-tender, non-distended MUSCULOSKELETAL:  No edema; No deformity  SKIN: Warm and dry LOWER EXTREMITIES: no swelling NEUROLOGIC:  Alert and oriented x 3 PSYCHIATRIC:  Normal affect   ASSESSMENT:    1. Coronary artery disease involving native coronary artery of native heart without angina pectoris   2. Bilateral carotid artery stenosis   3. Ischemic cardiomyopathy   4. V tach ([place])   5. Dyslipidemia   6. Essential hypertension    PLAN:    In order of problems listed above:  1. Coronary artery disease stable on appropriate medications we will continue dual antiplatelet agents. 2. Bilateral carotid artery stenosis recent carotid ultrasounds did not show critical lesions.  We will continue conservative approach. 3. Ischemic cardiomyopathy on appropriate medications I will increase dose of his ACE inhibitor he takes 5 mg lisinopril will go to 10 Chem-7 will be checked next week.  He is already on beta-blocker and I will switch him to long-acting form of this medication he will be taking the Toprol succinate 100 mg daily. 4. Dyslipidemia he is on Lipitor 80 which I will  continue.  His last LDL checked at the beginning of September was acceptable 64. 5. ICD present recent interrogation followed by our EP team.  No discharges.  He is on amiodarone in the future we will reduce the dose of this medication.   Medication Adjustments/Labs and Tests Ordered: Current medicines are reviewed at length with the patient today.  Concerns regarding medicines are outlined above.  No orders of the defined types were placed in this encounter.  Medication changes: No orders of the defined types were placed in this encounter.   Signed, Park Liter, MD, [facility] 06/08/2018 10:04 AM    [place]

## 2018-06-08 NOTE — Patient Instructions (Addendum)
Medication Instructions:  Your physician has recommended you make the following change in your medication:   STOP: metoprolol tartrate   START: metoprolol succinate 100 mg daily  INCREASE: Lisinopril to 10 mg daily   If you need a refill on your cardiac medications before your next appointment, please call your pharmacy.   Lab work: Your physician recommends that you return for lab work In 1 week: BMP   If you have labs (blood work) drawn today and your tests are completely normal, you will receive your results only by: Marland Kitchen MyChart Message (if you have MyChart) OR . A paper copy in the mail If you have any lab test that is abnormal or we need to change your treatment, we will call you to review the results.  Testing/Procedures: None  Follow-Up: At [facility], you and your health needs are our priority.  As part of our continuing mission to provide you with exceptional heart care, we have created designated Provider Care Teams.  These Care Teams include your primary Cardiologist (physician) and Advanced Practice Providers (APPs -  Physician Assistants and Nurse Practitioners) who all work together to provide you with the care you need, when you need it. You will need a follow up appointment in 4 months.  Please call our office 2 months in advance to schedule this appointment.  You may see [name], MD or another member of our [facility] Provider Team in [place]: Shirlee More, MD . Jyl Heinz, MD  Any Other Special Instructions Will Be Listed Below (If Applicable).   Metoprolol extended-release tablets What is this medicine? METOPROLOL (me TOE proe lole) is a beta-blocker. Beta-blockers reduce the workload on the heart and help it to beat more regularly. This medicine is used to treat high blood pressure and to prevent chest pain. It is also used to after a heart attack and to prevent an additional heart attack from occurring. This medicine may be used for other  purposes; ask your health care provider or pharmacist if you have questions. COMMON BRAND NAME(S): toprol, Toprol XL What should I tell my health care provider before I take this medicine? They need to know if you have any of these conditions: -diabetes -heart or vessel disease like slow heart rate, worsening heart failure, heart block, sick sinus syndrome or Raynaud's disease -kidney disease -liver disease -lung or breathing disease, like asthma or emphysema -pheochromocytoma -thyroid disease -an unusual or allergic reaction to metoprolol, other beta-blockers, medicines, foods, dyes, or preservatives -pregnant or trying to get pregnant -breast-feeding How should I use this medicine? Take this medicine by mouth with a glass of water. Follow the directions on the prescription label. Do not crush or chew. Take this medicine with or immediately after meals. Take your doses at regular intervals. Do not take more medicine than directed. Do not stop taking this medicine suddenly. This could lead to serious heart-related effects. Talk to your pediatrician regarding the use of this medicine in children. While this drug may be prescribed for children as young as 6 years for selected conditions, precautions do apply. Overdosage: If you think you have taken too much of this medicine contact a poison control center or emergency room at once. NOTE: This medicine is only for you. Do not share this medicine with others. What if I miss a dose? If you miss a dose, take it as soon as you can. If it is almost time for your next dose, take only that dose. Do not take double  or extra doses. What may interact with this medicine? This medicine may interact with the following medications: -certain medicines for blood pressure, heart disease, irregular heart beat -certain medicines for depression, like monoamine oxidase (MAO) inhibitors, fluoxetine, or  paroxetine -clonidine -dobutamine -epinephrine -isoproterenol -reserpine This list may not describe all possible interactions. Give your health care provider a list of all the medicines, herbs, non-prescription drugs, or dietary supplements you use. Also tell them if you smoke, drink alcohol, or use illegal drugs. Some items may interact with your medicine. What should I watch for while using this medicine? Visit your doctor or health care professional for regular check ups. Contact your doctor right away if your symptoms worsen. Check your blood pressure and pulse rate regularly. Ask your health care professional what your blood pressure and pulse rate should be, and when you should contact them. You may get drowsy or dizzy. Do not drive, use machinery, or do anything that needs mental alertness until you know how this medicine affects you. Do not sit or stand up quickly, especially if you are an older patient. This reduces the risk of dizzy or fainting spells. Contact your doctor if these symptoms continue. Alcohol may interfere with the effect of this medicine. Avoid alcoholic drinks. What side effects may I notice from receiving this medicine? Side effects that you should report to your doctor or health care professional as soon as possible: -allergic reactions like skin rash, itching or hives -cold or numb hands or feet -depression -difficulty breathing -faint -fever with sore throat -irregular heartbeat, chest pain -rapid weight gain -swollen legs or ankles Side effects that usually do not require medical attention (report to your doctor or health care professional if they continue or are bothersome): -anxiety or nervousness -change in sex drive or performance -dry skin -headache -nightmares or trouble sleeping -short term memory loss -stomach upset or diarrhea -unusually tired This list may not describe all possible side effects. Call your doctor for medical advice about side  effects. You may report side effects to FDA at 1-800-FDA-1088. Where should I keep my medicine? Keep out of the reach of children. Store at room temperature between 15 and 30 degrees C (59 and 86 degrees F). Throw away any unused medicine after the expiration date. NOTE: This sheet is a summary. It may not cover all possible information. If you have questions about this medicine, talk to your doctor, pharmacist, or health care provider.  2018 Elsevier/Gold Standard (2013-04-26 14:41:37)

## 2018-06-15 DIAGNOSIS — I10 Essential (primary) hypertension: Secondary | ICD-10-CM | POA: Diagnosis not present

## 2018-06-15 IMAGING — CR DG CHEST 2V
2 series · 2 of 2 positions shown · non-contrast
Comparison: Radiograph January 26, 2018.

CLINICAL DATA: Status post defibrillator placement.

EXAM:
CHEST - 2 VIEW

[chest lat]
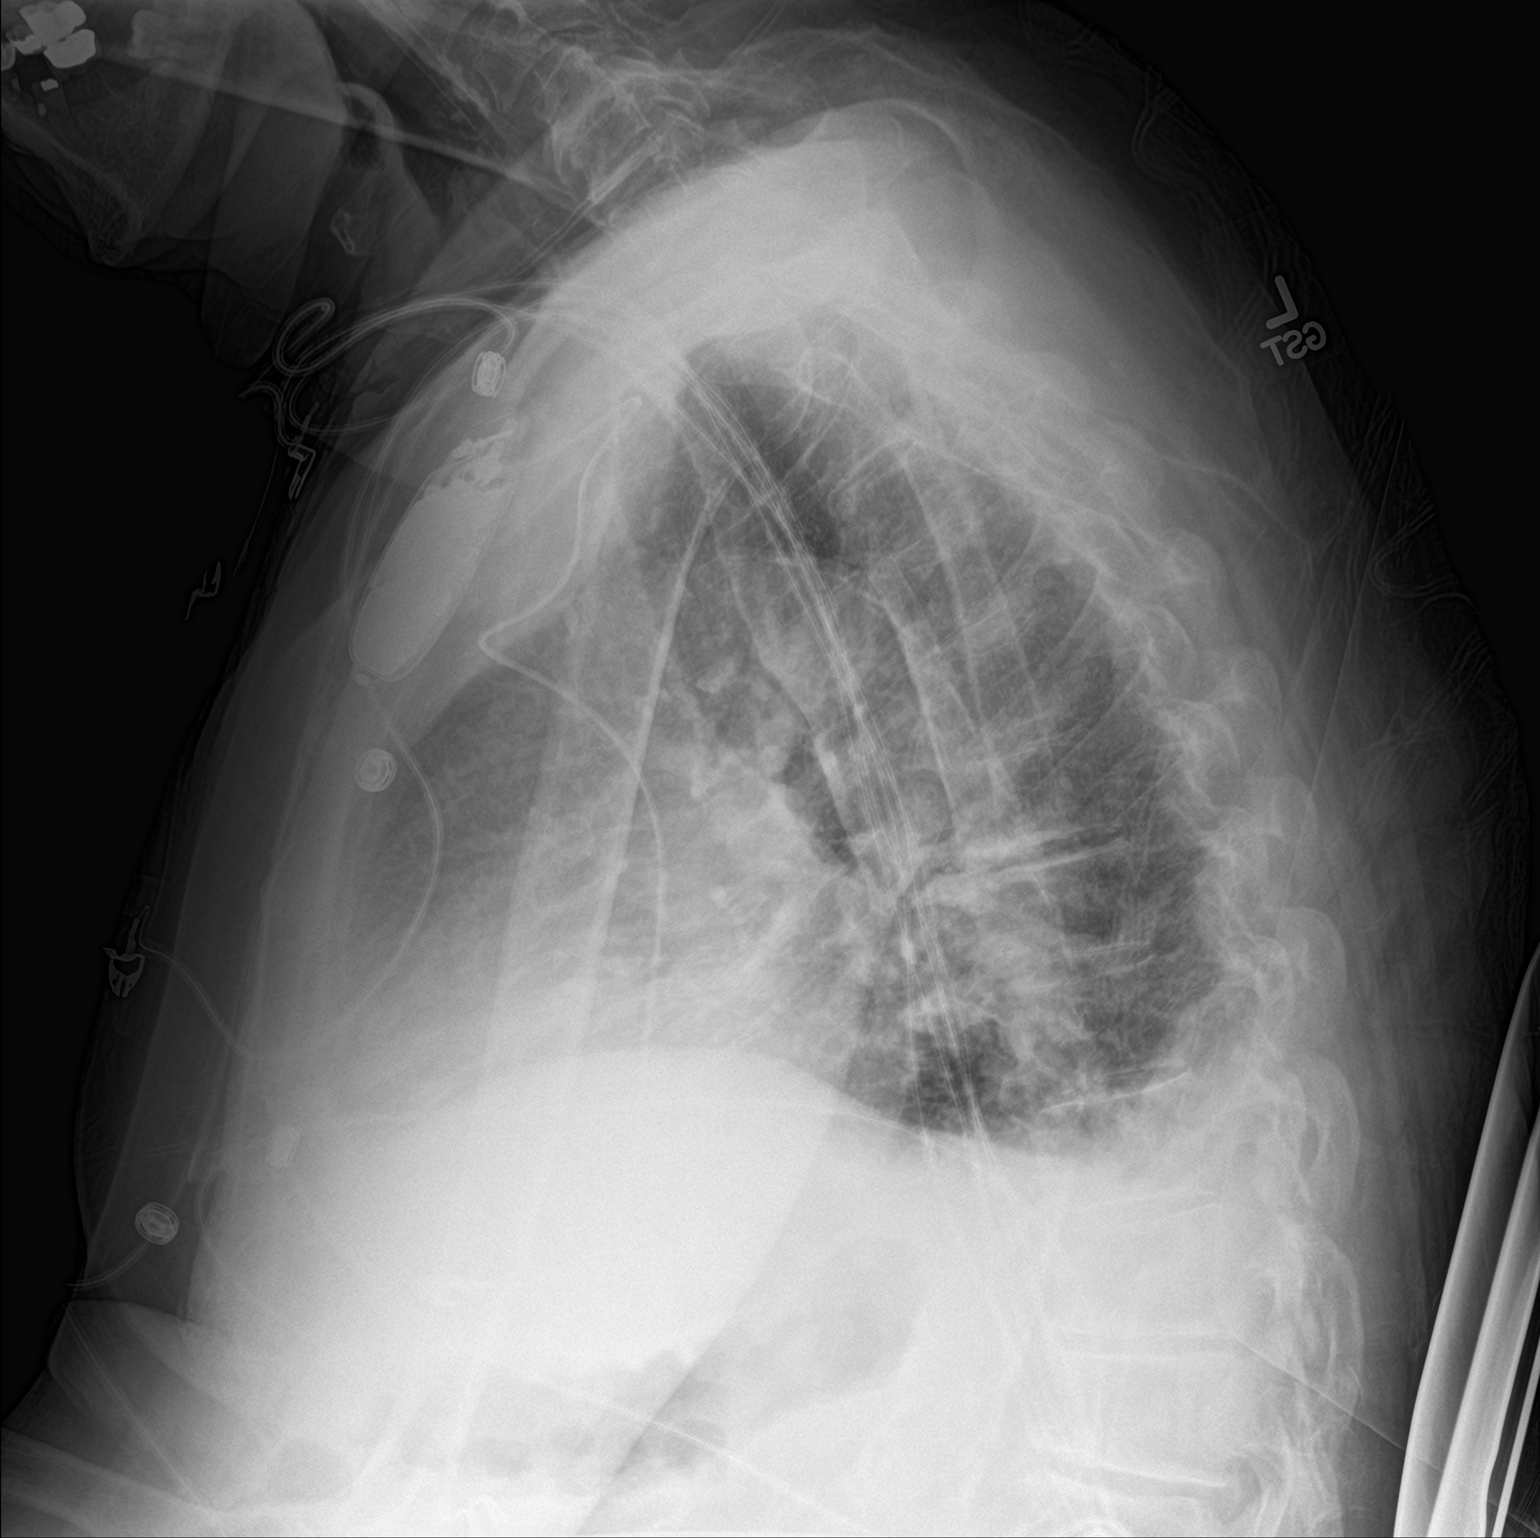

[chest ap]
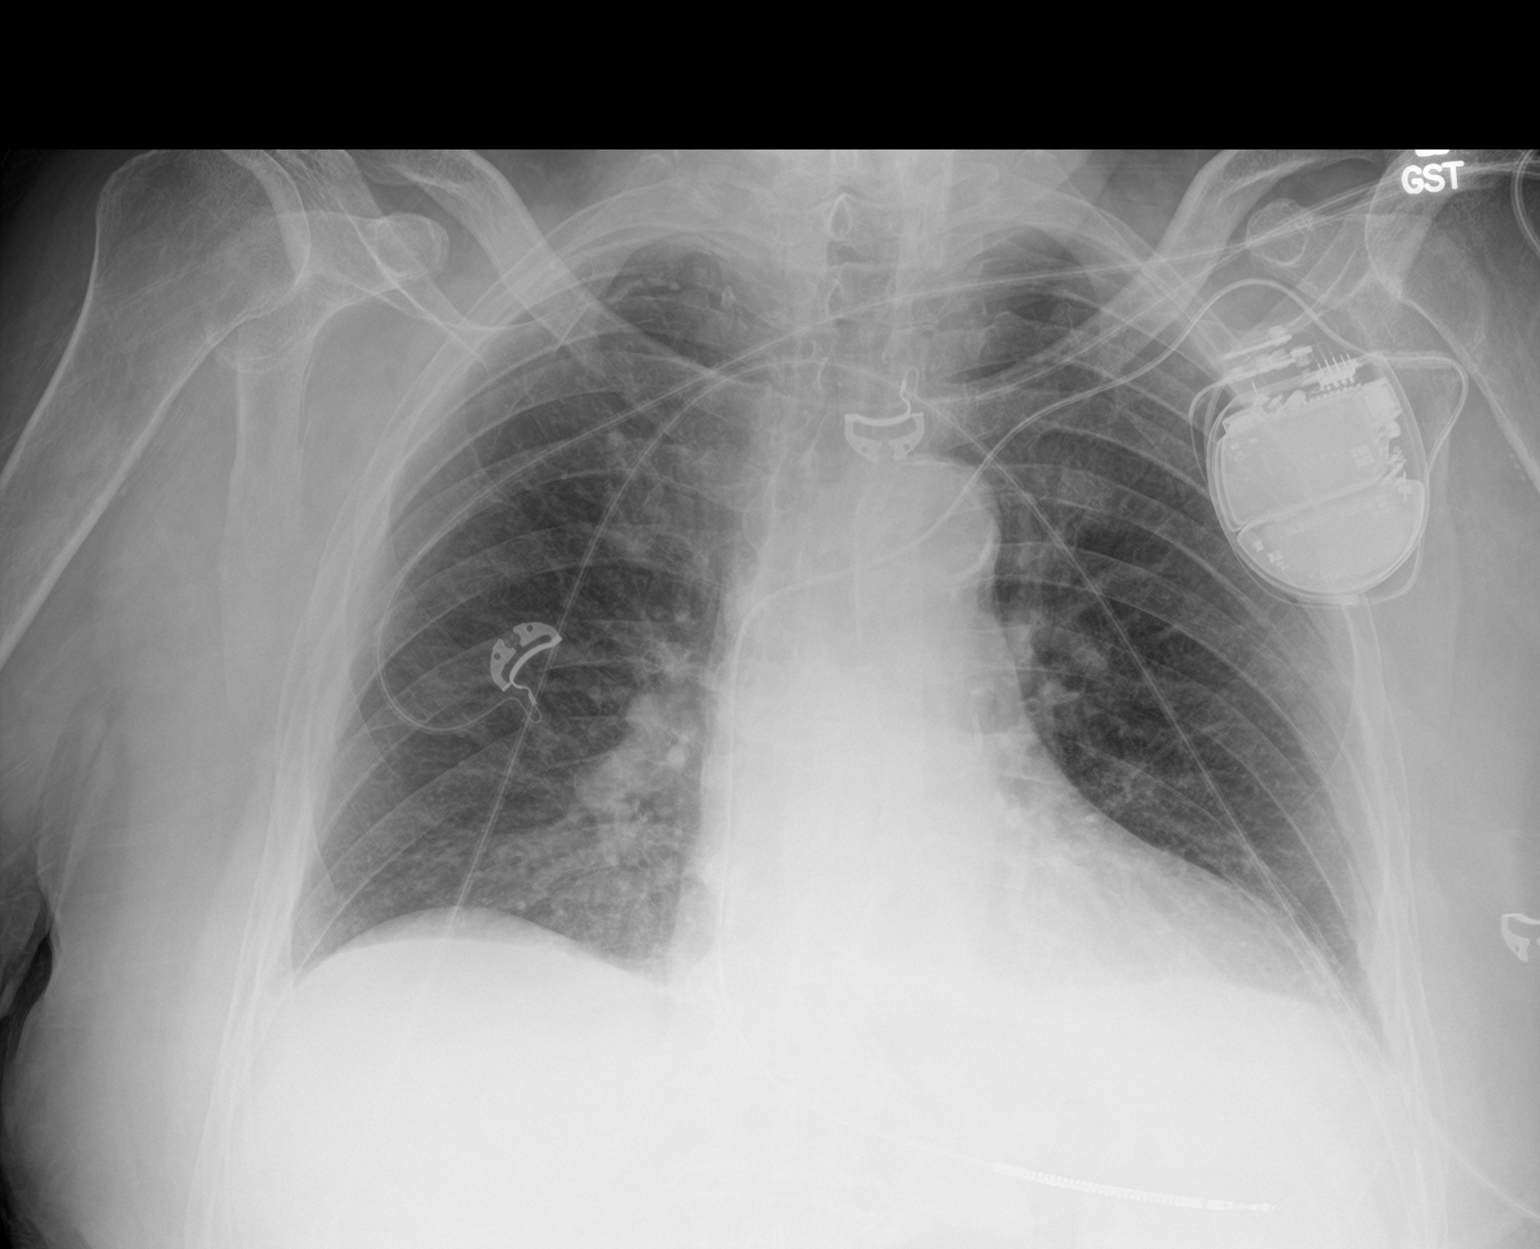

[2 of 2 positions shown; findings below may reference images not displayed]

FINDINGS: Stable cardiomediastinal silhouette. Atherosclerosis of thoracic
aorta is noted. Single lead left-sided pacemaker is noted with lead
in grossly good position. No pneumothorax is noted. No significant
pleural effusion is noted. Minimal bibasilar subsegmental
atelectasis is noted.
IMPRESSION: Single lead left-sided pacemaker in grossly good position. No
pneumothorax is noted. Minimal bibasilar subsegmental atelectasis.

Aortic Atherosclerosis (52A3L-OW0.0).

## 2018-06-16 LAB — BASIC METABOLIC PANEL
BUN/Creatinine Ratio: 10 (ref 10–24)
BUN: 11 mg/dL (ref 8–27)
CALCIUM: 9 mg/dL (ref 8.6–10.2)
CHLORIDE: 104 mmol/L (ref 96–106)
CO2: 21 mmol/L (ref 20–29)
CREATININE: 1.13 mg/dL (ref 0.76–1.27)
GFR calc non Af Amer: 60 mL/min/{1.73_m2} (ref >59)
GFR, EST AFRICAN AMERICAN: 70 mL/min/{1.73_m2} (ref >59)
GLUCOSE: 156 mg/dL — AB (ref 65–99)
Potassium: 4.4 mmol/L (ref 3.5–5.2)
Sodium: 143 mmol/L (ref 134–144)

## 2018-06-18 ENCOUNTER — Ambulatory Visit: Payer: Medicare Other | Admitting: Cardiovascular Disease

## 2018-06-20 ENCOUNTER — Telehealth: Payer: Self-pay

## 2018-06-20 ENCOUNTER — Telehealth: Payer: Self-pay | Admitting: *Deleted

## 2018-06-20 NOTE — Telephone Encounter (Signed)
Labs were normal and stable will mail results to patients home

## 2018-06-20 NOTE — Telephone Encounter (Signed)
-----   Message from Park Liter, MD sent at 06/20/2018  4:04 PM EDT ----- Richardean Sale looks  Good  Cont same

## 2018-06-20 NOTE — Telephone Encounter (Signed)
Pt phoned wanting results of blood work. Let him know labs were normal and stable and sent copy to his home.

## 2018-06-26 DIAGNOSIS — E669 Obesity, unspecified: Secondary | ICD-10-CM | POA: Diagnosis not present

## 2018-06-26 DIAGNOSIS — Z6831 Body mass index (BMI) 31.0-31.9, adult: Secondary | ICD-10-CM | POA: Diagnosis not present

## 2018-06-26 DIAGNOSIS — Z125 Encounter for screening for malignant neoplasm of prostate: Secondary | ICD-10-CM | POA: Diagnosis not present

## 2018-06-26 DIAGNOSIS — Z136 Encounter for screening for cardiovascular disorders: Secondary | ICD-10-CM | POA: Diagnosis not present

## 2018-06-26 DIAGNOSIS — Z9181 History of falling: Secondary | ICD-10-CM | POA: Diagnosis not present

## 2018-06-26 DIAGNOSIS — Z0000 Encounter for general adult medical examination without abnormal findings: Secondary | ICD-10-CM | POA: Diagnosis not present

## 2018-06-26 DIAGNOSIS — Z1331 Encounter for screening for depression: Secondary | ICD-10-CM | POA: Diagnosis not present

## 2018-06-26 DIAGNOSIS — E785 Hyperlipidemia, unspecified: Secondary | ICD-10-CM | POA: Diagnosis not present

## 2018-07-16 ENCOUNTER — Other Ambulatory Visit: Payer: Self-pay | Admitting: Cardiology

## 2018-07-16 ENCOUNTER — Other Ambulatory Visit: Payer: Self-pay | Admitting: Adult Health

## 2018-07-27 DIAGNOSIS — I129 Hypertensive chronic kidney disease with stage 1 through stage 4 chronic kidney disease, or unspecified chronic kidney disease: Secondary | ICD-10-CM | POA: Diagnosis not present

## 2018-07-27 DIAGNOSIS — R7989 Other specified abnormal findings of blood chemistry: Secondary | ICD-10-CM | POA: Diagnosis not present

## 2018-07-27 DIAGNOSIS — E7800 Pure hypercholesterolemia, unspecified: Secondary | ICD-10-CM | POA: Diagnosis not present

## 2018-07-27 DIAGNOSIS — E1122 Type 2 diabetes mellitus with diabetic chronic kidney disease: Secondary | ICD-10-CM | POA: Diagnosis not present

## 2018-07-27 DIAGNOSIS — N400 Enlarged prostate without lower urinary tract symptoms: Secondary | ICD-10-CM | POA: Diagnosis not present

## 2018-08-08 ENCOUNTER — Ambulatory Visit (INDEPENDENT_AMBULATORY_CARE_PROVIDER_SITE_OTHER): Payer: Medicare Other

## 2018-08-08 DIAGNOSIS — L570 Actinic keratosis: Secondary | ICD-10-CM | POA: Diagnosis not present

## 2018-08-08 DIAGNOSIS — I255 Ischemic cardiomyopathy: Secondary | ICD-10-CM | POA: Diagnosis not present

## 2018-08-08 DIAGNOSIS — L821 Other seborrheic keratosis: Secondary | ICD-10-CM | POA: Diagnosis not present

## 2018-08-08 DIAGNOSIS — L820 Inflamed seborrheic keratosis: Secondary | ICD-10-CM | POA: Diagnosis not present

## 2018-08-08 DIAGNOSIS — L578 Other skin changes due to chronic exposure to nonionizing radiation: Secondary | ICD-10-CM | POA: Diagnosis not present

## 2018-08-08 NOTE — Progress Notes (Signed)
Remote ICD transmission.   

## 2018-08-14 ENCOUNTER — Encounter: Payer: Self-pay | Admitting: Cardiology

## 2018-08-16 DIAGNOSIS — M1712 Unilateral primary osteoarthritis, left knee: Secondary | ICD-10-CM | POA: Diagnosis not present

## 2018-08-21 ENCOUNTER — Other Ambulatory Visit: Payer: Self-pay | Admitting: Cardiology

## 2018-08-21 NOTE — Telephone Encounter (Signed)
Left message to confirm dosage with patient

## 2018-08-22 NOTE — Telephone Encounter (Signed)
Daughter called back and said you could call her on her cell # Thursday, 12/19.

## 2018-08-23 NOTE — Telephone Encounter (Signed)
Medication refilled. Daughter reports patient takes 10 mg once daily.

## 2018-09-28 LAB — CUP PACEART REMOTE DEVICE CHECK
Battery Remaining Longevity: 95 mo
Battery Remaining Percentage: 89 %
Battery Voltage: 3.13 V
Brady Statistic RV Percent Paced: 1 %
HIGH POWER IMPEDANCE MEASURED VALUE: 70 Ohm
HIGH POWER IMPEDANCE MEASURED VALUE: 70 Ohm
Implantable Lead Model: 7122
Implantable Pulse Generator Implant Date: 20190528
Lead Channel Impedance Value: 460 Ohm
Lead Channel Pacing Threshold Amplitude: 1 V
Lead Channel Pacing Threshold Pulse Width: 0.5 ms
Lead Channel Sensing Intrinsic Amplitude: 11.7 mV
MDC IDC LEAD IMPLANT DT: 20190528
MDC IDC LEAD LOCATION: 753860
MDC IDC SESS DTM: 20191204070017
MDC IDC SET LEADCHNL RV PACING AMPLITUDE: 2.5 V
MDC IDC SET LEADCHNL RV PACING PULSEWIDTH: 0.5 ms
MDC IDC SET LEADCHNL RV SENSING SENSITIVITY: 0.5 mV
Pulse Gen Serial Number: 9781126

## 2018-10-29 ENCOUNTER — Encounter: Payer: Self-pay | Admitting: Cardiology

## 2018-10-29 ENCOUNTER — Ambulatory Visit: Payer: Medicare HMO | Admitting: Cardiology

## 2018-10-29 VITALS — BP 140/60 | HR 56 | Wt 211.6 lb

## 2018-10-29 DIAGNOSIS — I6523 Occlusion and stenosis of bilateral carotid arteries: Secondary | ICD-10-CM | POA: Diagnosis not present

## 2018-10-29 DIAGNOSIS — I255 Ischemic cardiomyopathy: Secondary | ICD-10-CM

## 2018-10-29 DIAGNOSIS — I129 Hypertensive chronic kidney disease with stage 1 through stage 4 chronic kidney disease, or unspecified chronic kidney disease: Secondary | ICD-10-CM | POA: Diagnosis not present

## 2018-10-29 DIAGNOSIS — R0602 Shortness of breath: Secondary | ICD-10-CM | POA: Diagnosis not present

## 2018-10-29 DIAGNOSIS — I472 Ventricular tachycardia, unspecified: Secondary | ICD-10-CM

## 2018-10-29 DIAGNOSIS — I2510 Atherosclerotic heart disease of native coronary artery without angina pectoris: Secondary | ICD-10-CM | POA: Diagnosis not present

## 2018-10-29 DIAGNOSIS — Z95810 Presence of automatic (implantable) cardiac defibrillator: Secondary | ICD-10-CM

## 2018-10-29 DIAGNOSIS — E1122 Type 2 diabetes mellitus with diabetic chronic kidney disease: Secondary | ICD-10-CM | POA: Diagnosis not present

## 2018-10-29 DIAGNOSIS — E7800 Pure hypercholesterolemia, unspecified: Secondary | ICD-10-CM | POA: Diagnosis not present

## 2018-10-29 MED ORDER — LOSARTAN POTASSIUM 50 MG PO TABS: 50.0000 mg | ORAL_TABLET | Freq: Every day | ORAL | 1 refills | Status: DC

## 2018-10-29 NOTE — Progress Notes (Signed)
Cardiology Office Note:    Date:  10/29/2018   ID:  Daryl Rodriguez, DOB June 04, 1934, MRN [id number]  PCP:  Nicoletta Dress, MD  Cardiologist:  [name], MD    Referring MD: Nicoletta Dress, MD   Chief Complaint  Patient presents with  . Follow-up  Doing well but complain of having cough  History of Present Illness:    Daryl Rodriguez is a 85 y.o. male cardiomyopathy which is ischemic in origin last cardiac catheterization done in July 2019 showed no targets for intervention.  He is doing well denies have any chest pain tightness squeezing pressure burning chest chief complaint is the fact that he have cough.  It is a dry cough and no fever no chills.  Obviously suspicion is lisinopril.  He exercised on the regular basis he reads a lot he plays and he is enjoying it.  Does not have discharges from the defibrillator.  Past Medical History:  Diagnosis Date  . Acute respiratory failure ([place])   . BPH (benign prostatic hyperplasia)   . Cardiac arrest ([place])   . Carpal tunnel syndrome   . CKD (chronic kidney disease)   . Diabetes ([place])   . DM II (diabetes mellitus, type II), controlled ([place])   . High risk medication use   . Hyperlipidemia   . Hypertension   . Low vitamin D level   . STEMI (ST elevation myocardial infarction) ([place])    01/22/18 PCI/DES to RCA  . Systolic heart failure ([place])   . VT (ventricular tachycardia) ([place])     Past Surgical History:  Procedure Laterality Date  . BACK SURGERY    . CORONARY STENT INTERVENTION N/A 01/22/2018   Procedure: CORONARY STENT INTERVENTION;  Surgeon: Troy Sine, MD;  Location: [facility];  Service: Cardiovascular;  Laterality: N/A;  . CORONARY/GRAFT ACUTE MI REVASCULARIZATION N/A 01/22/2018   Procedure: Coronary/Graft Acute MI Revascularization;  Surgeon: Troy Sine, MD;  Location: [facility];  Service: Cardiovascular;  Laterality: N/A;  . ICD IMPLANT N/A 01/30/2018   Procedure: ICD IMPLANT;  Surgeon:  Evans Lance, MD;  Location: [facility];  Service: Cardiovascular;  Laterality: N/A;  . LEFT HEART CATH AND CORONARY ANGIOGRAPHY N/A 01/22/2018   Procedure: LEFT HEART CATH AND CORONARY ANGIOGRAPHY;  Surgeon: Troy Sine, MD;  Location: [facility];  Service: Cardiovascular;  Laterality: N/A;  . LEFT HEART CATH AND CORONARY ANGIOGRAPHY N/A 01/25/2018   Procedure: LEFT HEART CATH AND CORONARY ANGIOGRAPHY - RELOOK;  Surgeon: Wellington Hampshire, MD;  Location: [facility];  Service: Cardiovascular;  Laterality: N/A;  . WRIST SURGERY      Current Medications: Current Meds  Medication Sig  . amiodarone (PACERONE) 200 MG tablet Take 1 tablet (200 mg total) by mouth daily.  Marland Kitchen amLODipine (NORVASC) 5 MG tablet Take 1 tablet (5 mg total) by mouth daily.  Marland Kitchen ammonium lactate (LAC-HYDRIN) 12 % lotion Apply 1 application topically 2 (two) times daily. Arms legs and hands  . aspirin EC 81 MG tablet Take 81 mg by mouth daily.  Marland Kitchen atorvastatin (LIPITOR) 80 MG tablet TAKE 1 TABLET BY MOUTH ONCE DAILY AT  6  PM  . clopidogrel (PLAVIX) 75 MG tablet Take 1 tablet (75 mg total) by mouth daily.  . Cyanocobalamin (VITAMIN B 12 PO) Take 500 mcg by mouth daily.  Marland Kitchen FERREX 150 150 MG capsule TAKE 1 CAPSULE BY MOUTH ONCE DAILY  . furosemide (LASIX) 40  MG tablet Take 1 tablet (40 mg total) by mouth daily. (Patient taking differently: Take 40 mg by mouth 2 (two) times daily. )  . gabapentin (NEURONTIN) 300 MG capsule Take 1 capsule by mouth 2 (two) times daily.  Marland Kitchen glimepiride (AMARYL) 4 MG tablet Take 4 mg by mouth daily.  . isosorbide mononitrate (IMDUR) 30 MG 24 hr tablet TAKE 1 TABLET BY MOUTH ONCE DAILY  . latanoprost (XALATAN) 0.005 % ophthalmic solution Place 1 drop into both eyes at bedtime.  Marland Kitchen lisinopril (PRINIVIL,ZESTRIL) 10 MG tablet Take 1 tablet (10 mg total) by mouth daily.  . metoprolol succinate (TOPROL-XL) 100 MG 24 hr tablet Take 1 tablet (100 mg total) by mouth daily. Take with  or immediately following a meal.  . nitroGLYCERIN (NITROSTAT) 0.4 MG SL tablet Place 1 tablet (0.4 mg total) under the tongue every 5 (five) minutes x 3 doses as needed for chest pain.  . potassium chloride SA (KLOR-CON M20) 20 MEQ tablet Take 1 tablet (20 mEq total) by mouth daily.  Marland Kitchen terazosin (HYTRIN) 1 MG capsule Take 1 mg by mouth daily.  Marland Kitchen triamcinolone cream (KENALOG) 0.1 % Apply 1 application topically as needed (Itchy skin).   . Vitamin D, Ergocalciferol, (DRISDOL) 50000 units CAPS capsule Take 50,000 Units by mouth once a week. Thursdays     Allergies:   Patient has no known allergies.   Social History   Socioeconomic History  . Marital status: Married    Spouse name: Not on file  . Number of children: Not on file  . Years of education: Not on file  . Highest education level: Not on file  Occupational History  . Not on file  Social Needs  . Financial resource strain: Not on file  . Food insecurity:    Worry: Not on file    Inability: Not on file  . Transportation needs:    Medical: Not on file    Non-medical: Not on file  Tobacco Use  . Smoking status: Former Research scientist (life sciences)  . Smokeless tobacco: Never Used  . Tobacco comment: quit smoking 50 years ago  Substance and Sexual Activity  . Alcohol use: Not Currently  . Drug use: Not Currently  . Sexual activity: Not on file  Lifestyle  . Physical activity:    Days per week: Not on file    Minutes per session: Not on file  . Stress: Not on file  Relationships  . Social connections:    Talks on phone: Not on file    Gets together: Not on file    Attends religious service: Not on file    Active member of club or organization: Not on file    Attends meetings of clubs or organizations: Not on file    Relationship status: Not on file  Other Topics Concern  . Not on file  Social History Narrative  . Not on file     Family History: The patient's family history includes Heart attack in his mother; Heart attack (age of  onset: 55) in his father; Stroke in his sister. ROS:   Please see the history of present illness.    All 14 point review of systems negative except as described per history of present illness  EKGs/Labs/Other Studies Reviewed:      Recent Labs: 01/22/2018: B Natriuretic Peptide 695.6 01/23/2018: ALT 30 01/30/2018: Magnesium 2.0 04/13/2018: Hemoglobin 12.0; Platelets 203 04/26/2018: NT-Pro BNP 1,739 06/15/2018: BUN 11; Creatinine, Ser 1.13; Potassium 4.4; Sodium 143  Recent Lipid Panel  Component Value Date/Time   CHOL 126 01/22/2018 1632   TRIG 75 01/22/2018 1632   HDL 43 01/22/2018 1632   CHOLHDL 2.9 01/22/2018 1632   VLDL 15 01/22/2018 1632   LDLCALC 68 01/22/2018 1632    Physical Exam:    VS:  BP 140/60   Pulse (!) 56   Wt 211 lb 9.6 oz (96 kg)   SpO2 97%   BMI 31.25 kg/m     Wt Readings from Last 3 Encounters:  10/29/18 211 lb 9.6 oz (96 kg)  06/08/18 214 lb (97.1 kg)  05/09/18 212 lb 6.4 oz (96.3 kg)     GEN:  Well nourished, well developed in no acute distress HEENT: Normal NECK: No JVD; No carotid bruits LYMPHATICS: No lymphadenopathy CARDIAC: RRR, no murmurs, no rubs, no gallops RESPIRATORY:  Clear to auscultation without rales, wheezing or rhonchi  ABDOMEN: Soft, non-tender, non-distended MUSCULOSKELETAL:  No edema; No deformity  SKIN: Warm and dry LOWER EXTREMITIES: no swelling NEUROLOGIC:  Alert and oriented x 3 PSYCHIATRIC:  Normal affect   ASSESSMENT:    1. Coronary artery disease involving native coronary artery of native heart without angina pectoris   2. Ischemic cardiomyopathy   3. Bilateral carotid artery stenosis   4. V-tach ([place])   5. ICD (implantable cardioverter-defibrillator) in place    PLAN:    In order of problems listed above:  1. Coronary disease stable on appropriate medications which I will continue. 2. Ischemic cardiomyopathy will stop lisinopril and start him on losartan. 3. Peripheral vascular disease and for bilateral  carotid artery stenosis carotic ultrasound will be done. 4. V. tach present.  Does have defibrillator also on amiodarone.  No recent discharges from the device. 5. ICD present follow-up by Dr. Dorothea Ogle.  Normal stable function.   Medication Adjustments/Labs and Tests Ordered: Current medicines are reviewed at length with the patient today.  Concerns regarding medicines are outlined above.  No orders of the defined types were placed in this encounter.  Medication changes: No orders of the defined types were placed in this encounter.   Signed, Park Liter, MD, [facility] 10/29/2018 9:05 AM    [place]

## 2018-10-29 NOTE — Patient Instructions (Signed)
Medication Instructions:  Your physician has recommended you make the following change in your medication:   Stop: Lisinopril   Start: Losartan 50 mg daily   If you need a refill on your cardiac medications before your next appointment, please call your pharmacy.   Lab work: None.  If you have labs (blood work) drawn today and your tests are completely normal, you will receive your results only by: Marland Kitchen MyChart Message (if you have MyChart) OR . A paper copy in the mail If you have any lab test that is abnormal or we need to change your treatment, we will call you to review the results.  Testing/Procedures: Your physician has requested that you have a carotid duplex. This test is an ultrasound of the carotid arteries in your neck. It looks at blood flow through these arteries that supply the brain with blood. Allow one hour for this exam. There are no restrictions or special instructions.    Follow-Up: At [facility], you and your health needs are our priority.  As part of our continuing mission to provide you with exceptional heart care, we have created designated Provider Care Teams.  These Care Teams include your primary Cardiologist (physician) and Advanced Practice Providers (APPs -  Physician Assistants and Nurse Practitioners) who all work together to provide you with the care you need, when you need it. You will need a follow up appointment in 4 months.  Please call our office 2 months in advance to schedule this appointment.  You may see [name], MD  or another member of our [facility] Provider Team in [place]: Shirlee More, MD . Jyl Heinz, MD  Any Other Special Instructions Will Be Listed Below (If Applicable).  Losartan tablets What is this medicine? LOSARTAN (loe SAR tan) is used to treat high blood pressure and to reduce the risk of stroke in certain patients. This drug also slows the progression of kidney disease in patients with diabetes. This  medicine may be used for other purposes; ask your health care provider or pharmacist if you have questions. COMMON BRAND NAME(S): Cozaar What should I tell my health care provider before I take this medicine? They need to know if you have any of these conditions: -heart failure -kidney or liver disease -an unusual or allergic reaction to losartan, other medicines, foods, dyes, or preservatives -pregnant or trying to get pregnant -breast-feeding How should I use this medicine? Take this medicine by mouth with a glass of water. Follow the directions on the prescription label. This medicine can be taken with or without food. Take your doses at regular intervals. Do not take your medicine more often than directed. Talk to your pediatrician regarding the use of this medicine in children. Special care may be needed. Overdosage: If you think you have taken too much of this medicine contact a poison control center or emergency room at once. NOTE: This medicine is only for you. Do not share this medicine with others. What if I miss a dose? If you miss a dose, take it as soon as you can. If it is almost time for your next dose, take only that dose. Do not take double or extra doses. What may interact with this medicine? -blood pressure medicines -diuretics, especially triamterene, spironolactone, or amiloride -fluconazole -NSAIDs, medicines for pain and inflammation, like ibuprofen or naproxen -potassium salts or potassium supplements -rifampin This list may not describe all possible interactions. Give your health care provider a list of all the medicines, herbs,  non-prescription drugs, or dietary supplements you use. Also tell them if you smoke, drink alcohol, or use illegal drugs. Some items may interact with your medicine. What should I watch for while using this medicine? Visit your doctor or health care professional for regular checks on your progress. Check your blood pressure as directed. Ask  your doctor or health care professional what your blood pressure should be and when you should contact him or her. Call your doctor or health care professional if you notice an irregular or fast heart beat. Women should inform their doctor if they wish to become pregnant or think they might be pregnant. There is a potential for serious side effects to an unborn child, particularly in the second or third trimester. Talk to your health care professional or pharmacist for more information. You may get drowsy or dizzy. Do not drive, use machinery, or do anything that needs mental alertness until you know how this drug affects you. Do not stand or sit up quickly, especially if you are an older patient. This reduces the risk of dizzy or fainting spells. Alcohol can make you more drowsy and dizzy. Avoid alcoholic drinks. Avoid salt substitutes unless you are told otherwise by your doctor or health care professional. Do not treat yourself for coughs, colds, or pain while you are taking this medicine without asking your doctor or health care professional for advice. Some ingredients may increase your blood pressure. What side effects may I notice from receiving this medicine? Side effects that you should report to your doctor or health care professional as soon as possible: -confusion, dizziness, light headedness or fainting spells -decreased amount of urine passed -difficulty breathing or swallowing, hoarseness, or tightening of the throat -fast or irregular heart beat, palpitations, or chest pain -skin rash, itching -swelling of your face, lips, tongue, hands, or feet Side effects that usually do not require medical attention (report to your doctor or health care professional if they continue or are bothersome): -cough -decreased sexual function or desire -headache -nasal congestion or stuffiness -nausea or stomach pain -sore or cramping muscles This list may not describe all possible side effects. Call  your doctor for medical advice about side effects. You may report side effects to FDA at 1-800-FDA-1088. Where should I keep my medicine? Keep out of the reach of children. Store at room temperature between 15 and 30 degrees C (59 and 86 degrees F). Protect from light. Keep container tightly closed. Throw away any unused medicine after the expiration date. NOTE: This sheet is a summary. It may not cover all possible information. If you have questions about this medicine, talk to your doctor, pharmacist, or health care provider.  2019 Elsevier/Gold Standard (2007-11-02 16:42:18)

## 2018-10-31 ENCOUNTER — Encounter: Payer: Self-pay | Admitting: Cardiology

## 2018-11-05 DIAGNOSIS — L821 Other seborrheic keratosis: Secondary | ICD-10-CM | POA: Diagnosis not present

## 2018-11-05 DIAGNOSIS — L570 Actinic keratosis: Secondary | ICD-10-CM | POA: Diagnosis not present

## 2018-11-05 DIAGNOSIS — L578 Other skin changes due to chronic exposure to nonionizing radiation: Secondary | ICD-10-CM | POA: Diagnosis not present

## 2018-11-06 DIAGNOSIS — N400 Enlarged prostate without lower urinary tract symptoms: Secondary | ICD-10-CM | POA: Diagnosis not present

## 2018-11-06 DIAGNOSIS — I129 Hypertensive chronic kidney disease with stage 1 through stage 4 chronic kidney disease, or unspecified chronic kidney disease: Secondary | ICD-10-CM | POA: Diagnosis not present

## 2018-11-06 DIAGNOSIS — Z125 Encounter for screening for malignant neoplasm of prostate: Secondary | ICD-10-CM | POA: Diagnosis not present

## 2018-11-06 DIAGNOSIS — E1122 Type 2 diabetes mellitus with diabetic chronic kidney disease: Secondary | ICD-10-CM | POA: Diagnosis not present

## 2018-11-06 DIAGNOSIS — E7800 Pure hypercholesterolemia, unspecified: Secondary | ICD-10-CM | POA: Diagnosis not present

## 2018-11-06 DIAGNOSIS — R7989 Other specified abnormal findings of blood chemistry: Secondary | ICD-10-CM | POA: Diagnosis not present

## 2018-11-06 DIAGNOSIS — Z6830 Body mass index (BMI) 30.0-30.9, adult: Secondary | ICD-10-CM | POA: Diagnosis not present

## 2018-11-06 DIAGNOSIS — D649 Anemia, unspecified: Secondary | ICD-10-CM | POA: Diagnosis not present

## 2018-11-07 ENCOUNTER — Ambulatory Visit (INDEPENDENT_AMBULATORY_CARE_PROVIDER_SITE_OTHER): Payer: Medicare HMO | Admitting: *Deleted

## 2018-11-07 DIAGNOSIS — I255 Ischemic cardiomyopathy: Secondary | ICD-10-CM | POA: Diagnosis not present

## 2018-11-07 LAB — CUP PACEART REMOTE DEVICE CHECK
Battery Remaining Longevity: 93 mo
Battery Remaining Percentage: 87 %
Battery Voltage: 3.07 V
Date Time Interrogation Session: 20200304070017
HighPow Impedance: 72 Ohm
HighPow Impedance: 72 Ohm
Implantable Lead Location: 753860
Implantable Lead Model: 7122
Lead Channel Impedance Value: 460 Ohm
Lead Channel Pacing Threshold Amplitude: 1 V
Lead Channel Pacing Threshold Pulse Width: 0.5 ms
Lead Channel Sensing Intrinsic Amplitude: 11.7 mV
Lead Channel Setting Pacing Amplitude: 2.5 V
Lead Channel Setting Pacing Pulse Width: 0.5 ms
Lead Channel Setting Sensing Sensitivity: 0.5 mV
MDC IDC LEAD IMPLANT DT: 20190528
MDC IDC PG IMPLANT DT: 20190528
MDC IDC STAT BRADY RV PERCENT PACED: 1 %
Pulse Gen Serial Number: 9781126

## 2018-11-15 ENCOUNTER — Encounter: Payer: Self-pay | Admitting: Cardiology

## 2018-11-15 NOTE — Progress Notes (Signed)
Remote ICD transmission.   

## 2018-11-27 ENCOUNTER — Telehealth: Payer: Self-pay | Admitting: Cardiology

## 2018-11-27 NOTE — Telephone Encounter (Signed)
[id number]

## 2018-12-31 ENCOUNTER — Other Ambulatory Visit: Payer: Self-pay | Admitting: Adult Health

## 2019-01-11 ENCOUNTER — Telehealth: Payer: Self-pay | Admitting: Internal Medicine

## 2019-01-11 NOTE — Telephone Encounter (Signed)
° ° °  Patient's daughter returned call. Reached out to A.Ewards via secure chat. Tora Perches requested to call patient back.

## 2019-01-11 NOTE — Telephone Encounter (Signed)
New message    [facility] for pt to call back and RS appt on 06.04.20. Will offer pt virtual visit with Dr. Lovena Le on 05.14.20. Will need to speak to pt if interested to obtain verbal consent.

## 2019-01-11 NOTE — Telephone Encounter (Signed)
Follow up    Set pt up for virtual visit with Dr. Lovena Le. Pt daughter will help him with video. Pt daughter number is listed in appt notes.      Virtual Visit Pre-Appointment Phone Call  "(Name), I am calling you today to discuss your upcoming appointment. We are currently trying to limit exposure to the virus that causes COVID-19 by seeing patients at home rather than in the office."  1. "What is the BEST phone number to call the day of the visit?" - include this in appointment notes  2. Do you have or have access to (through a family member/friend) a smartphone with video capability that we can use for your visit?" a. If yes - list this number in appt notes as cell (if different from BEST phone #) and list the appointment type as a VIDEO visit in appointment notes b. If no - list the appointment type as a PHONE visit in appointment notes  3. Confirm consent - "In the setting of the current Covid19 crisis, you are scheduled for a (phone or video) visit with your provider on (date) at (time).  Just as we do with many in-office visits, in order for you to participate in this visit, we must obtain consent.  If you'd like, I can send this to your mychart (if signed up) or email for you to review.  Otherwise, I can obtain your verbal consent now.  All virtual visits are billed to your insurance company just like a normal visit would be.  By agreeing to a virtual visit, we'd like you to understand that the technology does not allow for your provider to perform an examination, and thus may limit your provider's ability to fully assess your condition. If your provider identifies any concerns that need to be evaluated in person, we will make arrangements to do so.  Finally, though the technology is pretty good, we cannot assure that it will always work on either your or our end, and in the setting of a video visit, we may have to convert it to a phone-only visit.  In either situation, we cannot ensure  that we have a secure connection.  Are you willing to proceed?" STAFF: Did the patient verbally acknowledge consent to telehealth visit? Document YES/NO here: YES  4. Advise patient to be prepared - "Two hours prior to your appointment, go ahead and check your blood pressure, pulse, oxygen saturation, and your weight (if you have the equipment to check those) and write them all down. When your visit starts, your provider will ask you for this information. If you have an Apple Watch or Kardia device, please plan to have heart rate information ready on the day of your appointment. Please have a pen and paper handy nearby the day of the visit as well."  5. Give patient instructions for MyChart download to smartphone OR Doximity/Doxy.me as below if video visit (depending on what platform provider is using)  6. Inform patient they will receive a phone call 15 minutes prior to their appointment time (may be from unknown caller ID) so they should be prepared to answer    TELEPHONE CALL NOTE  Clayten Allcock has been deemed a candidate for a follow-up tele-health visit to limit community exposure during the Covid-19 pandemic. I spoke with the patient via phone to ensure availability of phone/video source, confirm preferred email & phone number, and discuss instructions and expectations.  I reminded Daryl Rodriguez to be prepared with any vital sign  and/or heart rhythm information that could potentially be obtained via home monitoring, at the time of his visit. I reminded Daryl Rodriguez to expect a phone call prior to his visit.  Ashland P Edwards 01/11/2019 3:00 PM   INSTRUCTIONS FOR DOWNLOADING THE MYCHART APP TO SMARTPHONE  - The patient must first make sure to have activated MyChart and know their login information - If Apple, go to [employer] and type in MyChart in the search bar and download the app. If Android, ask patient to go to [employer] and type in [place] in the search bar and download  the app. The app is free but as with any other app downloads, their phone may require them to verify saved payment information or Apple/Android password.  - The patient will need to then log into the app with their MyChart username and password, and select  as their healthcare provider to link the account. When it is time for your visit, go to the MyChart app, find appointments, and click Begin Video Visit. Be sure to Select Allow for your device to access the Microphone and Camera for your visit. You will then be connected, and your provider will be with you shortly.  **If they have any issues connecting, or need assistance please contact MyChart service desk [phone])**  **If using a computer, in order to ensure the best quality for their visit they will need to use either of the following Internet Browsers: [employer], or Google Chrome**  IF USING DOXIMITY or DOXY.ME - The patient will receive a link just prior to their visit by text.     FULL LENGTH CONSENT FOR TELE-HEALTH VISIT   I hereby voluntarily request, consent and authorize [facility] and its employed or contracted physicians, physician assistants, nurse practitioners or other licensed health care professionals (the Practitioner), to provide me with telemedicine health care services (the Services") as deemed necessary by the treating Practitioner. I acknowledge and consent to receive the Services by the Practitioner via telemedicine. I understand that the telemedicine visit will involve communicating with the Practitioner through live audiovisual communication technology and the disclosure of certain medical information by electronic transmission. I acknowledge that I have been given the opportunity to request an in-person assessment or other available alternative prior to the telemedicine visit and am voluntarily participating in the telemedicine visit.  I understand that I have the right to withhold  or withdraw my consent to the use of telemedicine in the course of my care at any time, without affecting my right to future care or treatment, and that the Practitioner or I may terminate the telemedicine visit at any time. I understand that I have the right to inspect all information obtained and/or recorded in the course of the telemedicine visit and may receive copies of available information for a reasonable fee.  I understand that some of the potential risks of receiving the Services via telemedicine include:   Delay or interruption in medical evaluation due to technological equipment failure or disruption;  Information transmitted may not be sufficient (e.g. poor resolution of images) to allow for appropriate medical decision making by the Practitioner; and/or   In rare instances, security protocols could fail, causing a breach of personal health information.  Furthermore, I acknowledge that it is my responsibility to provide information about my medical history, conditions and care that is complete and accurate to the best of my ability. I acknowledge that Practitioner's advice, recommendations, and/or decision may be based  on factors not within their control, such as incomplete or inaccurate data provided by me or distortions of diagnostic images or specimens that may result from electronic transmissions. I understand that the practice of medicine is not an exact science and that Practitioner makes no warranties or guarantees regarding treatment outcomes. I acknowledge that I will receive a copy of this consent concurrently upon execution via email to the email address I last provided but may also request a printed copy by calling the office of [facility].    I understand that my insurance will be billed for this visit.   I have read or had this consent read to me.  I understand the contents of this consent, which adequately explains the benefits and risks of the Services being provided  via telemedicine.   I have been provided ample opportunity to ask questions regarding this consent and the Services and have had my questions answered to my satisfaction.  I give my informed consent for the services to be provided through the use of telemedicine in my medical care  By participating in this telemedicine visit I agree to the above.

## 2019-02-05 DIAGNOSIS — E7800 Pure hypercholesterolemia, unspecified: Secondary | ICD-10-CM | POA: Diagnosis not present

## 2019-02-05 DIAGNOSIS — R0602 Shortness of breath: Secondary | ICD-10-CM | POA: Diagnosis not present

## 2019-02-06 ENCOUNTER — Ambulatory Visit (INDEPENDENT_AMBULATORY_CARE_PROVIDER_SITE_OTHER): Payer: Medicare HMO | Admitting: *Deleted

## 2019-02-06 DIAGNOSIS — I255 Ischemic cardiomyopathy: Secondary | ICD-10-CM

## 2019-02-07 ENCOUNTER — Encounter: Payer: Medicare Other | Admitting: Internal Medicine

## 2019-02-07 LAB — CUP PACEART REMOTE DEVICE CHECK
Battery Remaining Longevity: 91 mo
Battery Remaining Percentage: 85 %
Battery Voltage: 3.05 V
Brady Statistic RV Percent Paced: 1 %
Date Time Interrogation Session: 20200603060024
HighPow Impedance: 74 Ohm
HighPow Impedance: 74 Ohm
Implantable Lead Implant Date: 20190528
Implantable Lead Location: 753860
Implantable Lead Model: 7122
Implantable Pulse Generator Implant Date: 20190528
Lead Channel Impedance Value: 450 Ohm
Lead Channel Sensing Intrinsic Amplitude: 11.7 mV
Lead Channel Setting Pacing Amplitude: 2.5 V
Lead Channel Setting Pacing Pulse Width: 0.5 ms
Lead Channel Setting Sensing Sensitivity: 0.5 mV
Pulse Gen Serial Number: 9781126

## 2019-02-12 ENCOUNTER — Telehealth: Payer: Self-pay | Admitting: Cardiology

## 2019-02-12 DIAGNOSIS — E1122 Type 2 diabetes mellitus with diabetic chronic kidney disease: Secondary | ICD-10-CM | POA: Diagnosis not present

## 2019-02-12 DIAGNOSIS — E7800 Pure hypercholesterolemia, unspecified: Secondary | ICD-10-CM | POA: Diagnosis not present

## 2019-02-12 DIAGNOSIS — N182 Chronic kidney disease, stage 2 (mild): Secondary | ICD-10-CM | POA: Diagnosis not present

## 2019-02-12 DIAGNOSIS — D649 Anemia, unspecified: Secondary | ICD-10-CM | POA: Diagnosis not present

## 2019-02-12 DIAGNOSIS — R7989 Other specified abnormal findings of blood chemistry: Secondary | ICD-10-CM | POA: Diagnosis not present

## 2019-02-12 DIAGNOSIS — N400 Enlarged prostate without lower urinary tract symptoms: Secondary | ICD-10-CM | POA: Diagnosis not present

## 2019-02-12 DIAGNOSIS — Z125 Encounter for screening for malignant neoplasm of prostate: Secondary | ICD-10-CM | POA: Diagnosis not present

## 2019-02-12 DIAGNOSIS — R001 Bradycardia, unspecified: Secondary | ICD-10-CM | POA: Diagnosis not present

## 2019-02-12 NOTE — Telephone Encounter (Signed)
Patient's daughter informed of Dr. Wendy Poet advisement.

## 2019-02-12 NOTE — Telephone Encounter (Signed)
If pt is asymptomatic no need to change anything. He has ICD with backup pacing at 40 BPM

## 2019-02-12 NOTE — Telephone Encounter (Signed)
His pulse rate is in the 40's and [place], [place] thinks his meds should be changed

## 2019-02-15 ENCOUNTER — Encounter: Payer: Self-pay | Admitting: Cardiology

## 2019-02-15 NOTE — Progress Notes (Signed)
Remote ICD transmission.   

## 2019-02-25 ENCOUNTER — Other Ambulatory Visit: Payer: Self-pay | Admitting: Cardiology

## 2019-02-28 ENCOUNTER — Telehealth (INDEPENDENT_AMBULATORY_CARE_PROVIDER_SITE_OTHER): Payer: Medicare HMO | Admitting: Internal Medicine

## 2019-02-28 ENCOUNTER — Other Ambulatory Visit: Payer: Self-pay

## 2019-02-28 DIAGNOSIS — I472 Ventricular tachycardia, unspecified: Secondary | ICD-10-CM

## 2019-02-28 DIAGNOSIS — Z95810 Presence of automatic (implantable) cardiac defibrillator: Secondary | ICD-10-CM

## 2019-02-28 DIAGNOSIS — I255 Ischemic cardiomyopathy: Secondary | ICD-10-CM | POA: Diagnosis not present

## 2019-02-28 NOTE — Progress Notes (Signed)
Electrophysiology TeleHealth Note   Due to national recommendations of social distancing due to COVID 19, an audio/video telehealth visit is felt to be most appropriate for this patient at this time.  See MyChart message from today for the patient's consent to telehealth for [facility].   Date:  02/28/2019   ID:  Daryl Rodriguez, DOB 10/25/1933, MRN [id number]  Location: patient's home  Provider location: [address], [place]  Evaluation Performed: Follow-up visit  PCP:  Nicoletta Dress, MD  Cardiologist:  [name], MD  Electrophysiologist:  Dr Lovena Le  Chief Complaint:  "I've been kind of lazy."  History of Present Illness:    Daryl Rodriguez is a 85 y.o. male who presents via audio/video conferencing for a telehealth visit today.  He has an ICM, HTN, sinus node dysfunction and VT. He has been on medical therapy. No recurrent episodes of VT. He notes that his BP has been a little high and his HR a little low. Since last being seen in our clinic, the patient reports doing very well.  Today, he denies symptoms of palpitations, chest pain, shortness of breath,  lower extremity edema, dizziness, presyncope, or syncope.  The patient is otherwise without complaint today.  The patient denies symptoms of fevers, chills, cough, or new SOB worrisome for COVID 19.  Past Medical History:  Diagnosis Date   Acute respiratory failure (HCC)    BPH (benign prostatic hyperplasia)    Cardiac arrest (HCC)    Carpal tunnel syndrome    CKD (chronic kidney disease)    Diabetes ([place])    DM II (diabetes mellitus, type II), controlled ([place])    High risk medication use    Hyperlipidemia    Hypertension    Low vitamin D level    STEMI (ST elevation myocardial infarction) ([place])    01/22/18 PCI/DES to RCA   Systolic heart failure ([place])    VT (ventricular tachycardia) ([place])     Past Surgical History:  Procedure Laterality Date   BACK SURGERY     CORONARY  STENT INTERVENTION N/A 01/22/2018   Procedure: CORONARY STENT INTERVENTION;  Surgeon: Troy Sine, MD;  Location: [place] [facility];  Service: Cardiovascular;  Laterality: N/A;   CORONARY/GRAFT ACUTE MI REVASCULARIZATION N/A 01/22/2018   Procedure: Coronary/Graft Acute MI Revascularization;  Surgeon: Troy Sine, MD;  Location: [place] [facility];  Service: Cardiovascular;  Laterality: N/A;   ICD IMPLANT N/A 01/30/2018   Procedure: ICD IMPLANT;  Surgeon: Evans Lance, MD;  Location: [place] [facility];  Service: Cardiovascular;  Laterality: N/A;   LEFT HEART CATH AND CORONARY ANGIOGRAPHY N/A 01/22/2018   Procedure: LEFT HEART CATH AND CORONARY ANGIOGRAPHY;  Surgeon: Troy Sine, MD;  Location: [place] [facility];  Service: Cardiovascular;  Laterality: N/A;   LEFT HEART CATH AND CORONARY ANGIOGRAPHY N/A 01/25/2018   Procedure: LEFT HEART CATH AND CORONARY ANGIOGRAPHY - RELOOK;  Surgeon: Wellington Hampshire, MD;  Location: [place] [facility];  Service: Cardiovascular;  Laterality: N/A;   WRIST SURGERY      Current Outpatient Medications  Medication Sig Dispense Refill   amiodarone (PACERONE) 200 MG tablet Take 1 tablet (200 mg total) by mouth daily. 180 tablet 1   amLODipine (NORVASC) 5 MG tablet Take 1 tablet (5 mg total) by mouth daily. 180 tablet 3   ammonium lactate (LAC-HYDRIN) 12 % lotion Apply 1 application topically 2 (two) times daily. Arms legs and hands  aspirin EC 81 MG tablet Take 81 mg by mouth daily.     atorvastatin (LIPITOR) 80 MG tablet TAKE 1 TABLET BY MOUTH ONCE DAILY AT  6  PM 90 tablet 1   clopidogrel (PLAVIX) 75 MG tablet Take 1 tablet (75 mg total) by mouth daily. 90 tablet 3   Cyanocobalamin (VITAMIN B 12 PO) Take 500 mcg by mouth daily.     FERREX 150 150 MG capsule TAKE 1 CAPSULE BY MOUTH ONCE DAILY 90 capsule 3   furosemide (LASIX) 40 MG tablet Take 1 tablet (40 mg total) by mouth daily. (Patient taking differently: Take 40 mg by mouth 2  (two) times daily. ) 90 tablet 3   gabapentin (NEURONTIN) 300 MG capsule Take 1 capsule by mouth 2 (two) times daily.  1   glimepiride (AMARYL) 4 MG tablet Take 4 mg by mouth daily.     isosorbide mononitrate (IMDUR) 30 MG 24 hr tablet Take 1 tablet by mouth once daily 90 tablet 0   latanoprost (XALATAN) 0.005 % ophthalmic solution Place 1 drop into both eyes at bedtime.     losartan (COZAAR) 50 MG tablet Take 1 tablet (50 mg total) by mouth daily. 90 tablet 1   metoprolol succinate (TOPROL-XL) 100 MG 24 hr tablet Take 1 tablet (100 mg total) by mouth daily. Take with or immediately following a meal. 90 tablet 3   nitroGLYCERIN (NITROSTAT) 0.4 MG SL tablet Place 1 tablet (0.4 mg total) under the tongue every 5 (five) minutes x 3 doses as needed for chest pain. 25 tablet 1   potassium chloride SA (KLOR-CON M20) 20 MEQ tablet Take 1 tablet (20 mEq total) by mouth daily. 30 tablet 3   terazosin (HYTRIN) 1 MG capsule Take 1 mg by mouth daily.     triamcinolone cream (KENALOG) 0.1 % Apply 1 application topically as needed (Itchy skin).      Vitamin D, Ergocalciferol, (DRISDOL) 50000 units CAPS capsule Take 50,000 Units by mouth once a week. Thursdays     No current facility-administered medications for this visit.     Allergies:   Patient has no known allergies.   Social History:  The patient  reports that he has quit smoking. He has never used smokeless tobacco. He reports previous alcohol use. He reports previous drug use.   Family History:  The patient's  family history includes Heart attack in his mother; Heart attack (age of onset: 36) in his father; Stroke in his sister.   ROS:  Please see the history of present illness.   All other systems are personally reviewed and negative.    Exam:    Vital Signs:  P - 50, BP - 138/63 Well appearing, alert and conversant, regular work of breathing,  good skin color Eyes- anicteric, neuro- grossly intact, skin- no apparent rash or lesions  or cyanosis, mouth- oral mucosa is pink   Labs/Other Tests and Data Reviewed:    Recent Labs: 04/13/2018: Hemoglobin 12.0; Platelets 203 04/26/2018: NT-Pro BNP 1,739 06/15/2018: BUN 11; Creatinine, Ser 1.13; Potassium 4.4; Sodium 143   Wt Readings from Last 3 Encounters:  10/29/18 211 lb 9.6 oz (96 kg)  06/08/18 214 lb (97.1 kg)  05/09/18 212 lb 6.4 oz (96.3 kg)     Other studies personally reviewed: Additional studies/ records that were reviewed today include:  Last device remote is reviewed from [place] PDF dated 02/06/19 which reveals normal device function, no arrhythmias    ASSESSMENT & PLAN:    1.  VT - he is maintaining NSR with no episodes. He will reduce his dose of amiodarone to 200 mg daily, none on Sunday. 2. HTN - his bp is high. I have asked him to increase the losartan to 100 mg daily.  3. Sinus node dysfunciton - his HR's have been in the high 40's. I have asked him to reduce toprol to 50 mg daily. 4. COVID 19 screen The patient denies symptoms of COVID 19 at this time.  The importance of social distancing was discussed today.  Follow-up:  12 months Next remote: 9/20  Current medicines are reviewed at length with the patient today.   The patient does not have concerns regarding his medicines.  The following changes were made today:  none  Labs/ tests ordered today include: none No orders of the defined types were placed in this encounter.    Patient Risk:  after full review of this patients clinical status, I feel that they are at moderate risk at this time.  Today, I have spent 25 minutes with the patient with telehealth technology discussing all of the above .    Signed, Cristopher Peru, MD  02/28/2019 11:13 AM     [facility] [address] [place] [address] [phone] (office) [phone] (fax)

## 2019-03-04 ENCOUNTER — Ambulatory Visit: Payer: Medicare HMO | Admitting: Cardiology

## 2019-03-04 ENCOUNTER — Telehealth: Payer: Medicare HMO | Admitting: Cardiology

## 2019-03-04 MED ORDER — LOSARTAN POTASSIUM 100 MG PO TABS: 100.0000 mg | ORAL_TABLET | Freq: Every day | ORAL | 3 refills | Status: AC

## 2019-03-04 MED ORDER — AMIODARONE HCL 200 MG PO TABS: ORAL_TABLET | ORAL | 3 refills | Status: AC

## 2019-03-04 MED ORDER — METOPROLOL SUCCINATE ER 50 MG PO TB24: 50.0000 mg | ORAL_TABLET | Freq: Every day | ORAL | 3 refills | Status: AC

## 2019-03-04 NOTE — Addendum Note (Signed)
Addended by: Willeen Cass A on: 03/04/2019 04:39 PM   Modules accepted: Orders

## 2019-03-05 ENCOUNTER — Telehealth: Payer: Medicare HMO | Admitting: Cardiology

## 2019-03-06 ENCOUNTER — Other Ambulatory Visit: Payer: Self-pay

## 2019-03-06 ENCOUNTER — Ambulatory Visit (INDEPENDENT_AMBULATORY_CARE_PROVIDER_SITE_OTHER): Payer: Medicare HMO | Admitting: Cardiology

## 2019-03-06 ENCOUNTER — Encounter: Payer: Self-pay | Admitting: Cardiology

## 2019-03-06 VITALS — BP 142/60 | HR 52 | Ht 69.0 in | Wt 216.0 lb

## 2019-03-06 DIAGNOSIS — I472 Ventricular tachycardia, unspecified: Secondary | ICD-10-CM

## 2019-03-06 DIAGNOSIS — Z95810 Presence of automatic (implantable) cardiac defibrillator: Secondary | ICD-10-CM | POA: Diagnosis not present

## 2019-03-06 DIAGNOSIS — E785 Hyperlipidemia, unspecified: Secondary | ICD-10-CM | POA: Diagnosis not present

## 2019-03-06 DIAGNOSIS — E119 Type 2 diabetes mellitus without complications: Secondary | ICD-10-CM | POA: Diagnosis not present

## 2019-03-06 DIAGNOSIS — I2510 Atherosclerotic heart disease of native coronary artery without angina pectoris: Secondary | ICD-10-CM

## 2019-03-06 DIAGNOSIS — I255 Ischemic cardiomyopathy: Secondary | ICD-10-CM

## 2019-03-06 MED ORDER — AMLODIPINE BESYLATE 5 MG PO TABS: 10.0000 mg | ORAL_TABLET | Freq: Every day | ORAL | 1 refills | Status: DC

## 2019-03-06 NOTE — Patient Instructions (Signed)
Medication Instructions:  Your physician has recommended you make the following change in your medication:   INCREASE: Amlodipine 10 mg daily   If you need a refill on your cardiac medications before your next appointment, please call your pharmacy.   Lab work: None.  If you have labs (blood work) drawn today and your tests are completely normal, you will receive your results only by: Marland Kitchen MyChart Message (if you have MyChart) OR . A paper copy in the mail If you have any lab test that is abnormal or we need to change your treatment, we will call you to review the results.  Testing/Procedures: Your physician has requested that you have an echocardiogram. Echocardiography is a painless test that uses sound waves to create images of your heart. It provides your doctor with information about the size and shape of your heart and how well your heart's chambers and valves are working. This procedure takes approximately one hour. There are no restrictions for this procedure.    Follow-Up: At [facility], you and your health needs are our priority.  As part of our continuing mission to provide you with exceptional heart care, we have created designated Provider Care Teams.  These Care Teams include your primary Cardiologist (physician) and Advanced Practice Providers (APPs -  Physician Assistants and Nurse Practitioners) who all work together to provide you with the care you need, when you need it. You will need a follow up appointment in 3 months.  Please call our office 2 months in advance to schedule this appointment.  You may see [name], MD or another member of our [facility] Provider Team in [place]: Shirlee More, MD . Jyl Heinz, MD  Any Other Special Instructions Will Be Listed Below (If Applicable).   Echocardiogram An echocardiogram is a procedure that uses painless sound waves (ultrasound) to produce an image of the heart. Images from an echocardiogram can provide  important information about:  Signs of coronary artery disease (CAD).  Aneurysm detection. An aneurysm is a weak or damaged part of an artery wall that bulges out from the normal force of blood pumping through the body.  Heart size and shape. Changes in the size or shape of the heart can be associated with certain conditions, including heart failure, aneurysm, and CAD.  Heart muscle function.  Heart valve function.  Signs of a past heart attack.  Fluid buildup around the heart.  Thickening of the heart muscle.  A tumor or infectious growth around the heart valves. Tell a health care provider about:  Any allergies you have.  All medicines you are taking, including vitamins, herbs, eye drops, creams, and over-the-counter medicines.  Any blood disorders you have.  Any surgeries you have had.  Any medical conditions you have.  Whether you are pregnant or may be pregnant. What are the risks? Generally, this is a safe procedure. However, problems may occur, including:  Allergic reaction to dye (contrast) that may be used during the procedure. What happens before the procedure? No specific preparation is needed. You may eat and drink normally. What happens during the procedure?   An IV tube may be inserted into one of your veins.  You may receive contrast through this tube. A contrast is an injection that improves the quality of the pictures from your heart.  A gel will be applied to your chest.  A wand-like tool (transducer) will be moved over your chest. The gel will help to transmit the sound waves from the  transducer.  The sound waves will harmlessly bounce off of your heart to allow the heart images to be captured in real-time motion. The images will be recorded on a computer. The procedure may vary among health care providers and hospitals. What happens after the procedure?  You may return to your normal, everyday life, including diet, activities, and medicines,  unless your health care provider tells you not to do that. Summary  An echocardiogram is a procedure that uses painless sound waves (ultrasound) to produce an image of the heart.  Images from an echocardiogram can provide important information about the size and shape of your heart, heart muscle function, heart valve function, and fluid buildup around your heart.  You do not need to do anything to prepare before this procedure. You may eat and drink normally.  After the echocardiogram is completed, you may return to your normal, everyday life, unless your health care provider tells you not to do that. This information is not intended to replace advice given to you by your health care provider. Make sure you discuss any questions you have with your health care provider. Document Released: 08/19/2000 Document Revised: 12/13/2018 Document Reviewed: 09/24/2016 Elsevier Patient Education  2020 [employer].

## 2019-03-06 NOTE — Progress Notes (Signed)
Cardiology Office Note:    Date:  03/06/2019   ID:  Perl Rodriguez, DOB 1934/04/19, MRN [id number]  PCP:  Nicoletta Dress, MD  Cardiologist:  [name], MD    Referring MD: Nicoletta Dress, MD   Chief Complaint  Patient presents with  . Follow-up  Doing well  History of Present Illness:    Daryl Rodriguez is a 85 y.o. male with ischemic cardiomyopathy, coronary artery disease, history of V. tach, ICD present, diabetes, hypertension comes to my office today for regular follow-up overall he is doing well denies having any dizziness or passing out.  There is no discharges from the defibrillator denies have any chest pain tightness squeezing pressure burning chest overall doing well.  Past Medical History:  Diagnosis Date  . Acute respiratory failure (Daryl Rodriguez)   . BPH (benign prostatic hyperplasia)   . Cardiac arrest (Daryl Rodriguez)   . Carpal tunnel syndrome   . CKD (chronic kidney disease)   . Diabetes ([facility]aryl Rodriguez)   . DM II (diabetes mellitus, type II), controlled ([place])   . High risk medication use   . Hyperlipidemia   . Hypertension   . Low vitamin D level   . STEMI (ST elevation myocardial infarction) (Daryl Rodriguez)    01/22/18 PCI/DES to RCA  . Systolic heart failure (Daryl Rodriguez)   . VT (ventricular tachycardia) (Daryl Rodriguez)     Past Surgical History:  Procedure Laterality Date  . BACK SURGERY    . CORONARY STENT INTERVENTION N/A 01/22/2018   Procedure: CORONARY STENT INTERVENTION;  Surgeon: Troy Sine, MD;  Location: [facility];  Service: Cardiovascular;  Laterality: N/A;  . CORONARY/GRAFT ACUTE MI REVASCULARIZATION N/A 01/22/2018   Procedure: Coronary/Graft Acute MI Revascularization;  Surgeon: Troy Sine, MD;  Location: [facility];  Service: Cardiovascular;  Laterality: N/A;  . ICD IMPLANT N/A 01/30/2018   Procedure: ICD IMPLANT;  Surgeon: Evans Lance, MD;  Location: [facility];  Service: Cardiovascular;  Laterality: N/A;  . LEFT HEART CATH AND CORONARY  ANGIOGRAPHY N/A 01/22/2018   Procedure: LEFT HEART CATH AND CORONARY ANGIOGRAPHY;  Surgeon: Troy Sine, MD;  Location: [facility];  Service: Cardiovascular;  Laterality: N/A;  . LEFT HEART CATH AND CORONARY ANGIOGRAPHY N/A 01/25/2018   Procedure: LEFT HEART CATH AND CORONARY ANGIOGRAPHY - RELOOK;  Surgeon: Wellington Hampshire, MD;  Location: [facility];  Service: Cardiovascular;  Laterality: N/A;  . WRIST SURGERY      Current Medications: Current Meds  Medication Sig  . amiodarone (PACERONE) 200 MG tablet Take one tablet by mouth daily Monday through Saturday.  Do NOT take on Sunday.  Marland Kitchen amLODipine (NORVASC) 5 MG tablet Take 1 tablet (5 mg total) by mouth daily.  Marland Kitchen ammonium lactate (LAC-HYDRIN) 12 % lotion Apply 1 application topically 2 (two) times daily. Arms legs and hands  . aspirin EC 81 MG tablet Take 81 mg by mouth daily.  Marland Kitchen atorvastatin (LIPITOR) 80 MG tablet TAKE 1 TABLET BY MOUTH ONCE DAILY AT  6  PM  . clopidogrel (PLAVIX) 75 MG tablet Take 1 tablet (75 mg total) by mouth daily.  . Cyanocobalamin (VITAMIN B 12 PO) Take 500 mcg by mouth daily.  Marland Kitchen FERREX 150 150 MG capsule TAKE 1 CAPSULE BY MOUTH ONCE DAILY  . furosemide (LASIX) 40 MG tablet Take 1 tablet (40 mg total) by mouth daily. (Patient taking differently: Take 40 mg by mouth 2 (two) times daily. )  . gabapentin (NEURONTIN)  300 MG capsule Take 1 capsule by mouth 2 (two) times daily.  Marland Kitchen glimepiride (AMARYL) 2 MG tablet Take 2 mg by mouth daily.   . isosorbide mononitrate (IMDUR) 30 MG 24 hr tablet Take 1 tablet by mouth once daily  . latanoprost (XALATAN) 0.005 % ophthalmic solution Place 1 drop into both eyes at bedtime.  Marland Kitchen losartan (COZAAR) 100 MG tablet Take 1 tablet (100 mg total) by mouth daily.  . metoprolol succinate (TOPROL-XL) 50 MG 24 hr tablet Take 1 tablet (50 mg total) by mouth daily. Take with or immediately following a meal.  . nitroGLYCERIN (NITROSTAT) 0.4 MG SL tablet Place 1 tablet (0.4 mg  total) under the tongue every 5 (five) minutes x 3 doses as needed for chest pain.  . potassium chloride SA (KLOR-CON M20) 20 MEQ tablet Take 1 tablet (20 mEq total) by mouth daily.  Marland Kitchen terazosin (HYTRIN) 1 MG capsule Take 1 mg by mouth daily.  Marland Kitchen triamcinolone cream (KENALOG) 0.1 % Apply 1 application topically as needed (Itchy skin).   . Vitamin D, Ergocalciferol, (DRISDOL) 50000 units CAPS capsule Take 50,000 Units by mouth once a week. Thursdays     Allergies:   Patient has no known allergies.   Social History   Socioeconomic History  . Marital status: Married    Spouse name: Not on file  . Number of children: Not on file  . Years of education: Not on file  . Highest education level: Not on file  Occupational History  . Not on file  Social Needs  . Financial resource strain: Not on file  . Food insecurity    Worry: Not on file    Inability: Not on file  . Transportation needs    Medical: Not on file    Non-medical: Not on file  Tobacco Use  . Smoking status: Former Research scientist (life sciences)  . Smokeless tobacco: Never Used  . Tobacco comment: quit smoking 50 years ago  Substance and Sexual Activity  . Alcohol use: Not Currently  . Drug use: Not Currently  . Sexual activity: Not on file  Lifestyle  . Physical activity    Days per week: Not on file    Minutes per session: Not on file  . Stress: Not on file  Relationships  . Social Herbalist on phone: Not on file    Gets together: Not on file    Attends religious service: Not on file    Active member of club or organization: Not on file    Attends meetings of clubs or organizations: Not on file    Relationship status: Not on file  Other Topics Concern  . Not on file  Social History Narrative  . Not on file     Family History: The patient's family history includes Heart attack in his mother; Heart attack (age of onset: 41) in his father; Stroke in his sister. ROS:   Please see the history of present illness.    All 14  point review of systems negative except as described per history of present illness  EKGs/Labs/Other Studies Reviewed:      Recent Labs: 04/13/2018: Hemoglobin 12.0; Platelets 203 04/26/2018: NT-Pro BNP 1,739 06/15/2018: BUN 11; Creatinine, Ser 1.13; Potassium 4.4; Sodium 143  Recent Lipid Panel    Component Value Date/Time   CHOL 126 01/22/2018 1632   TRIG 75 01/22/2018 1632   HDL 43 01/22/2018 1632   CHOLHDL 2.9 01/22/2018 1632   VLDL 15 01/22/2018 1632  [place] 68 01/22/2018 1632    Physical Exam:    VS:  BP (!) 142/60   Pulse (!) 52   Ht 5\' 9"  (1.753 m)   Wt 216 lb (98 kg)   SpO2 98%   BMI 31.90 kg/m     Wt Readings from Last 3 Encounters:  03/06/19 216 lb (98 kg)  10/29/18 211 lb 9.6 oz (96 kg)  06/08/18 214 lb (97.1 kg)     GEN:  Well nourished, well developed in no acute distress HEENT: Normal NECK: No JVD; No carotid bruits LYMPHATICS: No lymphadenopathy CARDIAC: RRR, no murmurs, no rubs, no gallops RESPIRATORY:  Clear to auscultation without rales, wheezing or rhonchi  ABDOMEN: Soft, non-tender, non-distended MUSCULOSKELETAL:  No edema; No deformity  SKIN: Warm and dry LOWER EXTREMITIES: no swelling NEUROLOGIC:  Alert and oriented x 3 PSYCHIATRIC:  Normal affect   ASSESSMENT:    1. Ischemic cardiomyopathy   2. Coronary artery disease involving native coronary artery of native heart without angina pectoris   3. V-tach ([place])   4. Type 2 diabetes mellitus without complication, without long-term current use of insulin ([place])   5. ICD (implantable cardioverter-defibrillator) in place   6. Dyslipidemia    PLAN:    In order of problems listed above:  1. Ischemic cardiomyopathy.  I will schedule him to have an echocardiogram so we can reassess left ventricle ejection fraction.  He is taking ARB as well as beta-blocker which I will continue.  Concern is bradycardia his EKG today showed sinus bradycardia with rate of 48.  There is first-degree AV block  evidence of inferior wall myocardial infarction and nonspecific ST-T segment changes.  Recently he does have metoprolol has been lowered to only 50 mg a day as well as dose of amiodarone has been decreased from 7 times a week to only 6 times a week.  He likely does not have any symptoms of bradycardia there is no dizziness no passing out. 2. Coronary disease: Stable without any symptoms 3. History of V. tach denies having a discharges from the defibrillator.  I reviewed interrogation that was done recently and there was no therapies delivered. 4. ICD present interrogation reviewed. 5. Dyslipidemia continue with statin will do fasting lipid profile 6. Essential hypertension blood pressure is elevated he brought blood pressure measurements from home it is elevated I will double the dose of Norvasc I warned him about potentially increased swelling of lower extremities and asked him to let me know if that happened we may be forced to increase dose of diuretic   Medication Adjustments/Labs and Tests Ordered: Current medicines are reviewed at length with the patient today.  Concerns regarding medicines are outlined above.  No orders of the defined types were placed in this encounter.  Medication changes: No orders of the defined types were placed in this encounter.   Signed, Park Liter, MD, [facility] 03/06/2019 10:38 AM    [place]

## 2019-03-15 ENCOUNTER — Other Ambulatory Visit: Payer: Medicare HMO

## 2019-03-22 ENCOUNTER — Ambulatory Visit (HOSPITAL_BASED_OUTPATIENT_CLINIC_OR_DEPARTMENT_OTHER)
Admission: RE | Admit: 2019-03-22 | Discharge: 2019-03-22 | Disposition: A | Discharge status: Home / Self Care | Payer: Medicare HMO | Source: Ambulatory Visit | Attending: Cardiology | Admitting: Cardiology

## 2019-03-22 ENCOUNTER — Other Ambulatory Visit: Payer: Self-pay

## 2019-03-22 DIAGNOSIS — I255 Ischemic cardiomyopathy: Secondary | ICD-10-CM | POA: Insufficient documentation

## 2019-03-22 DIAGNOSIS — I2510 Atherosclerotic heart disease of native coronary artery without angina pectoris: Secondary | ICD-10-CM

## 2019-03-22 NOTE — Progress Notes (Signed)
Carotid doppler performed  03/22/19 Cardell Peach RDCS, RVT

## 2019-03-22 NOTE — Progress Notes (Signed)
  Echocardiogram 2D Echocardiogram has been performed.  Cardell Peach 03/22/2019, 3:16 PM

## 2019-03-26 ENCOUNTER — Telehealth: Payer: Self-pay | Admitting: Cardiology

## 2019-03-26 NOTE — Telephone Encounter (Signed)
Called patient back and informed him of echo results.

## 2019-03-26 NOTE — Telephone Encounter (Signed)
Please call patient with results of echo done last Friday at [facility].Marland Kitchen

## 2019-03-27 ENCOUNTER — Telehealth: Payer: Self-pay | Admitting: *Deleted

## 2019-03-27 NOTE — Telephone Encounter (Signed)
Left results on daughter's voicemail per dpr

## 2019-03-27 NOTE — Telephone Encounter (Signed)
Pt would like results of the carotid please, please advise

## 2019-04-05 DIAGNOSIS — R600 Localized edema: Secondary | ICD-10-CM | POA: Diagnosis not present

## 2019-04-05 DIAGNOSIS — I8310 Varicose veins of unspecified lower extremity with inflammation: Secondary | ICD-10-CM | POA: Diagnosis not present

## 2019-04-10 ENCOUNTER — Telehealth: Payer: Self-pay | Admitting: Cardiology

## 2019-04-10 NOTE — Telephone Encounter (Signed)
Left voicemail for patient's daughter, Asencion Partridge, to return phone call.

## 2019-04-10 NOTE — Telephone Encounter (Signed)
pts daughter is calling and has rash on legs and shins. PCP is asking if he should change his Norvasc? Please advise

## 2019-04-10 NOTE — Telephone Encounter (Signed)
Spoke with patient's daughter, she stated that within the past month the patient started having a rash and swelling since increasing his Amlodipine. He is following his's PCP's directions and he is elevating his feet. Please advised on changes that need to be made.

## 2019-04-11 NOTE — Telephone Encounter (Signed)
Patient's daughter, Asencion Partridge called back and she stated that she had not seen her father today. She is taking him to get compression socks tomorrow. She stated that he is elevating his legs 3 times a day and trying to start getting more exercise. She will have him to continue Amlodipine 10 mg daily and if he has not had any significant change she will contact the office to let us know about reducing back to Amlodipine 5 mg.

## 2019-04-11 NOTE — Telephone Encounter (Signed)
Left message for Daryl Rodriguez to call back.

## 2019-04-11 NOTE — Telephone Encounter (Signed)
I would cont soame if tht does not bather him too much. If yes, decrease amlodipine y half

## 2019-04-19 ENCOUNTER — Other Ambulatory Visit: Payer: Medicare HMO

## 2019-04-26 ENCOUNTER — Other Ambulatory Visit: Payer: Self-pay

## 2019-05-08 ENCOUNTER — Ambulatory Visit (INDEPENDENT_AMBULATORY_CARE_PROVIDER_SITE_OTHER): Payer: Medicare HMO | Admitting: *Deleted

## 2019-05-08 DIAGNOSIS — I255 Ischemic cardiomyopathy: Secondary | ICD-10-CM | POA: Diagnosis not present

## 2019-05-09 LAB — CUP PACEART REMOTE DEVICE CHECK
Battery Remaining Longevity: 89 mo
Battery Remaining Percentage: 84 %
Battery Voltage: 3.02 V
Brady Statistic RV Percent Paced: 1 %
Date Time Interrogation Session: 20200902060017
HighPow Impedance: 73 Ohm
HighPow Impedance: 73 Ohm
Implantable Lead Implant Date: 20190528
Implantable Lead Location: 753860
Implantable Lead Model: 7122
Implantable Pulse Generator Implant Date: 20190528
Lead Channel Impedance Value: 450 Ohm
Lead Channel Pacing Threshold Amplitude: 1 V
Lead Channel Pacing Threshold Pulse Width: 0.5 ms
Lead Channel Sensing Intrinsic Amplitude: 11.5 mV
Lead Channel Setting Pacing Amplitude: 2.5 V
Lead Channel Setting Pacing Pulse Width: 0.5 ms
Lead Channel Setting Sensing Sensitivity: 0.5 mV
Pulse Gen Serial Number: 9781126

## 2019-05-15 DIAGNOSIS — E1122 Type 2 diabetes mellitus with diabetic chronic kidney disease: Secondary | ICD-10-CM | POA: Diagnosis not present

## 2019-05-15 DIAGNOSIS — R0602 Shortness of breath: Secondary | ICD-10-CM | POA: Diagnosis not present

## 2019-05-15 DIAGNOSIS — D649 Anemia, unspecified: Secondary | ICD-10-CM | POA: Diagnosis not present

## 2019-05-15 DIAGNOSIS — E785 Hyperlipidemia, unspecified: Secondary | ICD-10-CM | POA: Diagnosis not present

## 2019-05-15 DIAGNOSIS — I129 Hypertensive chronic kidney disease with stage 1 through stage 4 chronic kidney disease, or unspecified chronic kidney disease: Secondary | ICD-10-CM | POA: Diagnosis not present

## 2019-05-22 DIAGNOSIS — Z23 Encounter for immunization: Secondary | ICD-10-CM | POA: Diagnosis not present

## 2019-05-22 DIAGNOSIS — E1122 Type 2 diabetes mellitus with diabetic chronic kidney disease: Secondary | ICD-10-CM | POA: Diagnosis not present

## 2019-05-22 DIAGNOSIS — N400 Enlarged prostate without lower urinary tract symptoms: Secondary | ICD-10-CM | POA: Diagnosis not present

## 2019-05-22 DIAGNOSIS — E7800 Pure hypercholesterolemia, unspecified: Secondary | ICD-10-CM | POA: Diagnosis not present

## 2019-05-22 DIAGNOSIS — R7989 Other specified abnormal findings of blood chemistry: Secondary | ICD-10-CM | POA: Diagnosis not present

## 2019-05-22 DIAGNOSIS — I129 Hypertensive chronic kidney disease with stage 1 through stage 4 chronic kidney disease, or unspecified chronic kidney disease: Secondary | ICD-10-CM | POA: Diagnosis not present

## 2019-05-22 DIAGNOSIS — Z139 Encounter for screening, unspecified: Secondary | ICD-10-CM | POA: Diagnosis not present

## 2019-05-22 DIAGNOSIS — D649 Anemia, unspecified: Secondary | ICD-10-CM | POA: Diagnosis not present

## 2019-05-22 DIAGNOSIS — N182 Chronic kidney disease, stage 2 (mild): Secondary | ICD-10-CM | POA: Diagnosis not present

## 2019-05-23 ENCOUNTER — Encounter: Payer: Self-pay | Admitting: Cardiology

## 2019-05-23 NOTE — Progress Notes (Signed)
Remote ICD transmission.   

## 2019-05-31 ENCOUNTER — Other Ambulatory Visit: Payer: Self-pay | Admitting: Cardiology

## 2019-06-05 ENCOUNTER — Ambulatory Visit (INDEPENDENT_AMBULATORY_CARE_PROVIDER_SITE_OTHER): Payer: Medicare HMO | Admitting: Cardiology

## 2019-06-05 ENCOUNTER — Other Ambulatory Visit: Payer: Self-pay

## 2019-06-05 VITALS — BP 132/58 | HR 52 | Ht 69.0 in | Wt 216.0 lb

## 2019-06-05 DIAGNOSIS — Z95810 Presence of automatic (implantable) cardiac defibrillator: Secondary | ICD-10-CM | POA: Diagnosis not present

## 2019-06-05 DIAGNOSIS — I255 Ischemic cardiomyopathy: Secondary | ICD-10-CM

## 2019-06-05 DIAGNOSIS — I6523 Occlusion and stenosis of bilateral carotid arteries: Secondary | ICD-10-CM

## 2019-06-05 DIAGNOSIS — I2510 Atherosclerotic heart disease of native coronary artery without angina pectoris: Secondary | ICD-10-CM | POA: Diagnosis not present

## 2019-06-05 DIAGNOSIS — E785 Hyperlipidemia, unspecified: Secondary | ICD-10-CM | POA: Diagnosis not present

## 2019-06-05 DIAGNOSIS — I739 Peripheral vascular disease, unspecified: Secondary | ICD-10-CM | POA: Diagnosis not present

## 2019-06-05 NOTE — Progress Notes (Signed)
Cardiology Office Note:    Date:  06/05/2019   ID:  Daryl Rodriguez, DOB 03-13-1934, MRN [id number]  PCP:  Nicoletta Dress, MD  Cardiologist:  [name], MD    Referring MD: Nicoletta Dress, MD   Chief Complaint  Patient presents with  . Follow-up  Doing well  History of Present Illness:    Daryl Rodriguez is a 85 y.o. male chemic cardiomyopathy however latest ejection fraction showed improvement left ventricular ejection fraction 5055%, carotid arterial disease bilateral.  Dyslipidemia.  Essential hypertension.  Comes today today also for follow-up.  Seems to be doing well.  Spent a lot of time talking about the incoming election.  Denies have any chest pain tightness squeezing pressure bring chest no swelling of lower extremities denies having any discharges from the defibrillator.  He does describe to have pain in both legs.  It looks like exertional look like fairly typical claudication.  Past Medical History:  Diagnosis Date  . Acute respiratory failure ([place])   . BPH (benign prostatic hyperplasia)   . Cardiac arrest ([place])   . Carpal tunnel syndrome   . CKD (chronic kidney disease)   . Diabetes ([place])   . DM II (diabetes mellitus, type II), controlled ([place])   . High risk medication use   . Hyperlipidemia   . Hypertension   . Low vitamin D level   . STEMI (ST elevation myocardial infarction) ([place])    01/22/18 PCI/DES to RCA  . Systolic heart failure ([place])   . VT (ventricular tachycardia) ([place])     Past Surgical History:  Procedure Laterality Date  . BACK SURGERY    . CORONARY STENT INTERVENTION N/A 01/22/2018   Procedure: CORONARY STENT INTERVENTION;  Surgeon: Troy Sine, MD;  Location: [place] [facility];  Service: Cardiovascular;  Laterality: N/A;  . CORONARY/GRAFT ACUTE MI REVASCULARIZATION N/A 01/22/2018   Procedure: Coronary/Graft Acute MI Revascularization;  Surgeon: Troy Sine, MD;  Location: [place] [facility];  Service: Cardiovascular;   Laterality: N/A;  . ICD IMPLANT N/A 01/30/2018   Procedure: ICD IMPLANT;  Surgeon: Evans Lance, MD;  Location: [place] [facility];  Service: Cardiovascular;  Laterality: N/A;  . LEFT HEART CATH AND CORONARY ANGIOGRAPHY N/A 01/22/2018   Procedure: LEFT HEART CATH AND CORONARY ANGIOGRAPHY;  Surgeon: Troy Sine, MD;  Location: [facility] [place] [facility];  Service: Cardiovascular;  Laterality: N/A;  . LEFT HEART CATH AND CORONARY ANGIOGRAPHY N/A 01/25/2018   Procedure: LEFT HEART CATH AND CORONARY ANGIOGRAPHY - RELOOK;  Surgeon: Wellington Hampshire, MD;  Location: [facility];  Service: Cardiovascular;  Laterality: N/A;  . WRIST SURGERY      Current Medications: Current Meds  Medication Sig  . amiodarone (PACERONE) 200 MG tablet Take one tablet by mouth daily Monday through Saturday.  Do NOT take on Sunday.  Marland Kitchen amLODipine (NORVASC) 5 MG tablet Take 2 tablets (10 mg total) by mouth daily.  Marland Kitchen ammonium lactate (LAC-HYDRIN) 12 % lotion Apply 1 application topically 2 (two) times daily. Arms legs and hands  . aspirin EC 81 MG tablet Take 81 mg by mouth daily.  Marland Kitchen atorvastatin (LIPITOR) 80 MG tablet TAKE 1 TABLET BY MOUTH ONCE DAILY AT  6  PM  . clopidogrel (PLAVIX) 75 MG tablet Take 1 tablet (75 mg total) by mouth daily.  . Cyanocobalamin (VITAMIN B 12 PO) Take 500 mcg by mouth daily.  Marland Kitchen FERREX 150 150 MG capsule TAKE 1 CAPSULE BY MOUTH ONCE  DAILY  . furosemide (LASIX) 40 MG tablet Take 1 tablet (40 mg total) by mouth daily. (Patient taking differently: Take 40 mg by mouth 2 (two) times daily. )  . gabapentin (NEURONTIN) 300 MG capsule Take 1 capsule by mouth 2 (two) times daily.  Marland Kitchen glimepiride (AMARYL) 2 MG tablet Take 2 mg by mouth daily.   . isosorbide mononitrate (IMDUR) 30 MG 24 hr tablet Take 1 tablet by mouth once daily  . latanoprost (XALATAN) 0.005 % ophthalmic solution Place 1 drop into both eyes at bedtime.  Marland Kitchen losartan (COZAAR) 100 MG tablet Take 1 tablet (100 mg total) by mouth daily.   . metoprolol succinate (TOPROL-XL) 50 MG 24 hr tablet Take 1 tablet (50 mg total) by mouth daily. Take with or immediately following a meal.  . nitroGLYCERIN (NITROSTAT) 0.4 MG SL tablet Place 1 tablet (0.4 mg total) under the tongue every 5 (five) minutes x 3 doses as needed for chest pain.  . potassium chloride SA (KLOR-CON M20) 20 MEQ tablet Take 1 tablet (20 mEq total) by mouth daily.  Marland Kitchen terazosin (HYTRIN) 1 MG capsule Take 1 mg by mouth daily.  Marland Kitchen triamcinolone cream (KENALOG) 0.1 % Apply 1 application topically as needed (Itchy skin).   . Vitamin D, Ergocalciferol, (DRISDOL) 50000 units CAPS capsule Take 50,000 Units by mouth once a week. Thursdays     Allergies:   Patient has no known allergies.   Social History   Socioeconomic History  . Marital status: Married    Spouse name: Not on file  . Number of children: Not on file  . Years of education: Not on file  . Highest education level: Not on file  Occupational History  . Not on file  Social Needs  . Financial resource strain: Not on file  . Food insecurity    Worry: Not on file    Inability: Not on file  . Transportation needs    Medical: Not on file    Non-medical: Not on file  Tobacco Use  . Smoking status: Former Research scientist (life sciences)  . Smokeless tobacco: Never Used  . Tobacco comment: quit smoking 50 years ago  Substance and Sexual Activity  . Alcohol use: Not Currently  . Drug use: Not Currently  . Sexual activity: Not on file  Lifestyle  . Physical activity    Days per week: Not on file    Minutes per session: Not on file  . Stress: Not on file  Relationships  . Social Herbalist on phone: Not on file    Gets together: Not on file    Attends religious service: Not on file    Active member of club or organization: Not on file    Attends meetings of clubs or organizations: Not on file    Relationship status: Not on file  Other Topics Concern  . Not on file  Social History Narrative  . Not on file      Family History: The patient's family history includes Heart attack in his mother; Heart attack (age of onset: 67) in his father; Stroke in his sister. ROS:   Please see the history of present illness.    All 14 point review of systems negative except as described per history of present illness  EKGs/Labs/Other Studies Reviewed:      Recent Labs: 06/15/2018: BUN 11; Creatinine, Ser 1.13; Potassium 4.4; Sodium 143  Recent Lipid Panel    Component Value Date/Time   CHOL 126 01/22/2018 1632  TRIG 75 01/22/2018 1632   HDL 43 01/22/2018 1632   CHOLHDL 2.9 01/22/2018 1632   VLDL 15 01/22/2018 1632   LDLCALC 68 01/22/2018 1632    Physical Exam:    VS:  BP (!) 132/58   Pulse (!) 52   Ht 5\' 9"  (1.753 m)   Wt 216 lb (98 kg)   SpO2 98%   BMI 31.90 kg/m     Wt Readings from Last 3 Encounters:  06/05/19 216 lb (98 kg)  03/06/19 216 lb (98 kg)  10/29/18 211 lb 9.6 oz (96 kg)     GEN:  Well nourished, well developed in no acute distress HEENT: Normal NECK: No JVD; No carotid bruits LYMPHATICS: No lymphadenopathy CARDIAC: RRR, no murmurs, no rubs, no gallops RESPIRATORY:  Clear to auscultation without rales, wheezing or rhonchi  ABDOMEN: Soft, non-tender, non-distended MUSCULOSKELETAL:  No edema; No deformity  SKIN: Warm and dry LOWER EXTREMITIES: no swelling NEUROLOGIC:  Alert and oriented x 3 PSYCHIATRIC:  Normal affect   ASSESSMENT:    1. Coronary artery disease involving native coronary artery of native heart without angina pectoris   2. Ischemic cardiomyopathy   3. Bilateral carotid artery stenosis   4. ICD (implantable cardioverter-defibrillator) in place   5. Dyslipidemia    PLAN:    In order of problems listed above:  1. Coronary artery disease stable from that point we will continue present management. 2. Ischemic cardiomyopathy with near normalization continue curren medications 3. Bilateral carotid artery stenosis.  Up to 60%.  Medical management. 4. ICD  present I reviewed interrogation normal function normal battery status 5. Dyslipidemia last fasting lipid profile from summer was excellent continue present management 6. Bilateral claudication will do segmental pressures with arterial duplex.   Medication Adjustments/Labs and Tests Ordered: Current medicines are reviewed at length with the patient today.  Concerns regarding medicines are outlined above.  No orders of the defined types were placed in this encounter.  Medication changes: No orders of the defined types were placed in this encounter.   Signed, Park Liter, MD, [facility] 06/05/2019 4:02 PM    [facility]

## 2019-06-05 NOTE — Patient Instructions (Signed)
Medication Instructions:  Your physician recommends that you continue on your current medications as directed. Please refer to the Current Medication list given to you today.  If you need a refill on your cardiac medications before your next appointment, please call your pharmacy.   Lab work: None Ordered  Testing/Procedure  Your physician has requested that you have a lower or upper extremity venous duplex. This test is an ultrasound of the veins in the legs or arms. It looks at venous blood flow that carries blood from the heart to the legs or arms. Allow one hour for a Lower Venous exam. Allow thirty minutes for an Upper Venous exam. There are no restrictions or special instructions.   Follow-Up: At [facility], you and your health needs are our priority.  As part of our continuing mission to provide you with exceptional heart care, we have created designated Provider Care Teams.  These Care Teams include your primary Cardiologist (physician) and Advanced Practice Providers (APPs -  Physician Assistants and Nurse Practitioners) who all work together to provide you with the care you need, when you need it. You will need a follow up appointment in 5 months.  Please call our office 2 months in advance to schedule this appointment.  You may see [name], MD or another member of our [facility] Provider Team in [place]: Shirlee More, MD . Jyl Heinz, MD

## 2019-06-13 ENCOUNTER — Other Ambulatory Visit: Payer: Self-pay | Admitting: Cardiology

## 2019-06-13 NOTE — Telephone Encounter (Signed)
This is a [place] pt °

## 2019-07-02 DIAGNOSIS — Z0000 Encounter for general adult medical examination without abnormal findings: Secondary | ICD-10-CM | POA: Diagnosis not present

## 2019-07-02 DIAGNOSIS — E785 Hyperlipidemia, unspecified: Secondary | ICD-10-CM | POA: Diagnosis not present

## 2019-07-02 DIAGNOSIS — Z9181 History of falling: Secondary | ICD-10-CM | POA: Diagnosis not present

## 2019-07-02 DIAGNOSIS — R233 Spontaneous ecchymoses: Secondary | ICD-10-CM | POA: Diagnosis not present

## 2019-07-02 DIAGNOSIS — Z1331 Encounter for screening for depression: Secondary | ICD-10-CM | POA: Diagnosis not present

## 2019-07-02 DIAGNOSIS — L570 Actinic keratosis: Secondary | ICD-10-CM | POA: Diagnosis not present

## 2019-07-02 DIAGNOSIS — L821 Other seborrheic keratosis: Secondary | ICD-10-CM | POA: Diagnosis not present

## 2019-07-11 ENCOUNTER — Other Ambulatory Visit: Payer: Self-pay

## 2019-07-11 ENCOUNTER — Ambulatory Visit (INDEPENDENT_AMBULATORY_CARE_PROVIDER_SITE_OTHER): Payer: Medicare HMO

## 2019-07-11 DIAGNOSIS — I739 Peripheral vascular disease, unspecified: Secondary | ICD-10-CM | POA: Diagnosis not present

## 2019-07-11 NOTE — Progress Notes (Addendum)
Bilateral lower extremity venous exam has been performed. No evidence of DVT.  Jimmy Josephmichael Lisenbee RDCS, RVT

## 2019-07-25 ENCOUNTER — Other Ambulatory Visit: Payer: Self-pay | Admitting: Cardiology

## 2019-08-07 ENCOUNTER — Ambulatory Visit (INDEPENDENT_AMBULATORY_CARE_PROVIDER_SITE_OTHER): Payer: Medicare HMO | Admitting: *Deleted

## 2019-08-07 DIAGNOSIS — I255 Ischemic cardiomyopathy: Secondary | ICD-10-CM

## 2019-08-07 LAB — CUP PACEART REMOTE DEVICE CHECK
Battery Remaining Longevity: 87 mo
Battery Remaining Percentage: 81 %
Battery Voltage: 3.01 V
Brady Statistic RV Percent Paced: 1 %
Date Time Interrogation Session: 20201202020016
HighPow Impedance: 72 Ohm
HighPow Impedance: 72 Ohm
Implantable Lead Implant Date: 20190528
Implantable Lead Location: 753860
Implantable Lead Model: 7122
Implantable Pulse Generator Implant Date: 20190528
Lead Channel Impedance Value: 450 Ohm
Lead Channel Pacing Threshold Amplitude: 1 V
Lead Channel Pacing Threshold Pulse Width: 0.5 ms
Lead Channel Sensing Intrinsic Amplitude: 11.1 mV
Lead Channel Setting Pacing Amplitude: 2.5 V
Lead Channel Setting Pacing Pulse Width: 0.5 ms
Lead Channel Setting Sensing Sensitivity: 0.5 mV
Pulse Gen Serial Number: 9781126

## 2019-08-27 DIAGNOSIS — E7800 Pure hypercholesterolemia, unspecified: Secondary | ICD-10-CM | POA: Diagnosis not present

## 2019-08-27 DIAGNOSIS — E1122 Type 2 diabetes mellitus with diabetic chronic kidney disease: Secondary | ICD-10-CM | POA: Diagnosis not present

## 2019-08-27 DIAGNOSIS — R0602 Shortness of breath: Secondary | ICD-10-CM | POA: Diagnosis not present

## 2019-08-27 DIAGNOSIS — I129 Hypertensive chronic kidney disease with stage 1 through stage 4 chronic kidney disease, or unspecified chronic kidney disease: Secondary | ICD-10-CM | POA: Diagnosis not present

## 2019-09-02 NOTE — Progress Notes (Signed)
ICD remote 

## 2019-09-12 DIAGNOSIS — R7989 Other specified abnormal findings of blood chemistry: Secondary | ICD-10-CM | POA: Diagnosis not present

## 2019-09-12 DIAGNOSIS — E1122 Type 2 diabetes mellitus with diabetic chronic kidney disease: Secondary | ICD-10-CM | POA: Diagnosis not present

## 2019-09-12 DIAGNOSIS — Z139 Encounter for screening, unspecified: Secondary | ICD-10-CM | POA: Diagnosis not present

## 2019-09-12 DIAGNOSIS — E7800 Pure hypercholesterolemia, unspecified: Secondary | ICD-10-CM | POA: Diagnosis not present

## 2019-09-12 DIAGNOSIS — N400 Enlarged prostate without lower urinary tract symptoms: Secondary | ICD-10-CM | POA: Diagnosis not present

## 2019-09-12 DIAGNOSIS — K5900 Constipation, unspecified: Secondary | ICD-10-CM | POA: Diagnosis not present

## 2019-09-12 DIAGNOSIS — I129 Hypertensive chronic kidney disease with stage 1 through stage 4 chronic kidney disease, or unspecified chronic kidney disease: Secondary | ICD-10-CM | POA: Diagnosis not present

## 2019-09-12 DIAGNOSIS — D649 Anemia, unspecified: Secondary | ICD-10-CM | POA: Diagnosis not present

## 2019-09-12 DIAGNOSIS — N182 Chronic kidney disease, stage 2 (mild): Secondary | ICD-10-CM | POA: Diagnosis not present

## 2019-10-16 ENCOUNTER — Other Ambulatory Visit: Payer: Self-pay | Admitting: Cardiology

## 2019-10-25 ENCOUNTER — Encounter: Payer: Self-pay | Admitting: Cardiology

## 2019-10-25 ENCOUNTER — Other Ambulatory Visit: Payer: Self-pay

## 2019-10-25 ENCOUNTER — Ambulatory Visit (INDEPENDENT_AMBULATORY_CARE_PROVIDER_SITE_OTHER): Payer: Medicare HMO | Admitting: Cardiology

## 2019-10-25 VITALS — BP 140/54 | HR 58 | Temp 97.7°F | Ht 69.0 in | Wt 214.4 lb

## 2019-10-25 DIAGNOSIS — E119 Type 2 diabetes mellitus without complications: Secondary | ICD-10-CM

## 2019-10-25 DIAGNOSIS — I255 Ischemic cardiomyopathy: Secondary | ICD-10-CM

## 2019-10-25 DIAGNOSIS — I2510 Atherosclerotic heart disease of native coronary artery without angina pectoris: Secondary | ICD-10-CM | POA: Diagnosis not present

## 2019-10-25 DIAGNOSIS — I10 Essential (primary) hypertension: Secondary | ICD-10-CM | POA: Diagnosis not present

## 2019-10-25 DIAGNOSIS — Z95810 Presence of automatic (implantable) cardiac defibrillator: Secondary | ICD-10-CM | POA: Diagnosis not present

## 2019-10-25 NOTE — Patient Instructions (Signed)

## 2019-10-25 NOTE — Progress Notes (Signed)
Cardiology Office Note:    Date:  10/25/2019   ID:  Daryl Rodriguez, DOB 1933/10/22, MRN [id number]  PCP:  Nicoletta Dress, MD  Cardiologist:  [name], MD    Referring MD: Nicoletta Dress, MD   No chief complaint on file. Doing very well  History of Present Illness:    Daryl Rodriguez is a 86 y.o. male hospital history significant for ischemic cardiomyopathy, however latest estimation of ejection breath present, carotid arterial disease, dyslipidemia, essential hypertension, V. tach, ICD present.  Comes to the office for follow-up.  Overall he is doing great.  He is asymptomatic he admits that he does not do much exercises because of poor weather but overall seems to be doing well.  Past Medical History:  Diagnosis Date  . Acute respiratory failure ([place])   . BPH (benign prostatic hyperplasia)   . Cardiac arrest ([place])   . Carpal tunnel syndrome   . CKD (chronic kidney disease)   . Diabetes ([place])   . DM II (diabetes mellitus, type II), controlled ([place])   . High risk medication use   . Hyperlipidemia   . Hypertension   . Low vitamin D level   . STEMI (ST elevation myocardial infarction) ([place])    01/22/18 PCI/DES to RCA  . Systolic heart failure ([place])   . VT (ventricular tachycardia) ([place])     Past Surgical History:  Procedure Laterality Date  . BACK SURGERY    . CORONARY STENT INTERVENTION N/A 01/22/2018   Procedure: CORONARY STENT INTERVENTION;  Surgeon: Troy Sine, MD;  Location: [place] [facility] [facility];  Service: Cardiovascular;  Laterality: N/A;  . CORONARY/GRAFT ACUTE MI REVASCULARIZATION N/A 01/22/2018   Procedure: Coronary/Graft Acute MI Revascularization;  Surgeon: Troy Sine, MD;  Location: [facility];  Service: Cardiovascular;  Laterality: N/A;  . ICD IMPLANT N/A 01/30/2018   Procedure: ICD IMPLANT;  Surgeon: Evans Lance, MD;  Location: [place] [facility];  Service: Cardiovascular;  Laterality: N/A;  . LEFT HEART CATH AND CORONARY  ANGIOGRAPHY N/A 01/22/2018   Procedure: LEFT HEART CATH AND CORONARY ANGIOGRAPHY;  Surgeon: Troy Sine, MD;  Location: [place] [facility];  Service: Cardiovascular;  Laterality: N/A;  . LEFT HEART CATH AND CORONARY ANGIOGRAPHY N/A 01/25/2018   Procedure: LEFT HEART CATH AND CORONARY ANGIOGRAPHY - RELOOK;  Surgeon: Wellington Hampshire, MD;  Location: [facility];  Service: Cardiovascular;  Laterality: N/A;  . WRIST SURGERY      Current Medications: Current Meds  Medication Sig  . amiodarone (PACERONE) 200 MG tablet Take one tablet by mouth daily Monday through Saturday.  Do NOT take on Sunday.  Marland Kitchen amLODipine (NORVASC) 10 MG tablet Take 10 mg by mouth daily.   Marland Kitchen ammonium lactate (LAC-HYDRIN) 12 % lotion Apply 1 application topically 2 (two) times daily. Arms legs and hands  . aspirin EC 81 MG tablet Take 81 mg by mouth daily.  Marland Kitchen atorvastatin (LIPITOR) 80 MG tablet TAKE 1 TABLET BY MOUTH ONCE DAILY AT  6  PM  . clopidogrel (PLAVIX) 75 MG tablet Take 1 tablet by mouth once daily  . Cyanocobalamin (VITAMIN B 12 PO) Take 500 mcg by mouth daily.  Marland Kitchen FERREX 150 150 MG capsule TAKE 1 CAPSULE BY MOUTH ONCE DAILY  . furosemide (LASIX) 40 MG tablet Take 1 tablet (40 mg total) by mouth daily. (Patient taking differently: Take 40 mg by mouth 2 (two) times daily. )  . gabapentin (NEURONTIN) 300 MG capsule  Take 1 capsule by mouth 2 (two) times daily.  Marland Kitchen glimepiride (AMARYL) 2 MG tablet Take 2 mg by mouth daily.   . isosorbide mononitrate (IMDUR) 30 MG 24 hr tablet Take 1 tablet by mouth once daily  . latanoprost (XALATAN) 0.005 % ophthalmic solution Place 1 drop into both eyes at bedtime.  Marland Kitchen losartan (COZAAR) 100 MG tablet Take 1 tablet (100 mg total) by mouth daily.  . metoprolol succinate (TOPROL-XL) 50 MG 24 hr tablet Take 1 tablet (50 mg total) by mouth daily. Take with or immediately following a meal.  . nitroGLYCERIN (NITROSTAT) 0.4 MG SL tablet Place 1 tablet (0.4 mg total) under the tongue  every 5 (five) minutes x 3 doses as needed for chest pain.  . potassium chloride SA (KLOR-CON M20) 20 MEQ tablet Take 1 tablet (20 mEq total) by mouth daily.  Marland Kitchen terazosin (HYTRIN) 1 MG capsule Take 1 mg by mouth daily.  Marland Kitchen triamcinolone cream (KENALOG) 0.1 % Apply 1 application topically as needed (Itchy skin).   . Vitamin D, Ergocalciferol, (DRISDOL) 50000 units CAPS capsule Take 50,000 Units by mouth once a week. Thursdays  . [DISCONTINUED] amLODipine (NORVASC) 5 MG tablet Take 2 tablets by mouth once daily     Allergies:   Patient has no known allergies.   Social History   Socioeconomic History  . Marital status: Married    Spouse name: Not on file  . Number of children: Not on file  . Years of education: Not on file  . Highest education level: Not on file  Occupational History  . Not on file  Tobacco Use  . Smoking status: Former Research scientist (life sciences)  . Smokeless tobacco: Never Used  . Tobacco comment: quit smoking 50 years ago  Substance and Sexual Activity  . Alcohol use: Not Currently  . Drug use: Not Currently  . Sexual activity: Not on file  Other Topics Concern  . Not on file  Social History Narrative  . Not on file   Social Determinants of Health   Financial Resource Strain:   . Difficulty of Paying Living Expenses: Not on file  Food Insecurity:   . Worried About Charity fundraiser in the Last Year: Not on file  . Ran Out of Food in the Last Year: Not on file  Transportation Needs:   . Lack of Transportation (Medical): Not on file  . Lack of Transportation (Non-Medical): Not on file  Physical Activity:   . Days of Exercise per Week: Not on file  . Minutes of Exercise per Session: Not on file  Stress:   . Feeling of Stress : Not on file  Social Connections:   . Frequency of Communication with Friends and Family: Not on file  . Frequency of Social Gatherings with Friends and Family: Not on file  . Attends Religious Services: Not on file  . Active Member of Clubs or  Organizations: Not on file  . Attends Archivist Meetings: Not on file  . Marital Status: Not on file     Family History: The patient's family history includes Heart attack in his mother; Heart attack (age of onset: 99) in his father; Stroke in his sister. ROS:   Please see the history of present illness.    All 14 point review of systems negative except as described per history of present illness  EKGs/Labs/Other Studies Reviewed:      Recent Labs: No results found for requested labs within last 8760 hours.  Recent Lipid  Panel    Component Value Date/Time   CHOL 126 01/22/2018 1632   TRIG 75 01/22/2018 1632   HDL 43 01/22/2018 1632   CHOLHDL 2.9 01/22/2018 1632   VLDL 15 01/22/2018 1632   LDLCALC 68 01/22/2018 1632    Physical Exam:    VS:  BP (!) 140/54   Pulse (!) 58   Temp 97.7 F (36.5 C)   Ht 5\' 9"  (1.753 m)   Wt 214 lb 6.4 oz (97.3 kg)   SpO2 96%   BMI 31.66 kg/m     Wt Readings from Last 3 Encounters:  10/25/19 214 lb 6.4 oz (97.3 kg)  06/05/19 216 lb (98 kg)  03/06/19 216 lb (98 kg)     GEN:  Well nourished, well developed in no acute distress HEENT: Normal NECK: No JVD; No carotid bruits LYMPHATICS: No lymphadenopathy CARDIAC: RRR, no murmurs, no rubs, no gallops RESPIRATORY:  Clear to auscultation without rales, wheezing or rhonchi  ABDOMEN: Soft, non-tender, non-distended MUSCULOSKELETAL:  No edema; No deformity  SKIN: Warm and dry LOWER EXTREMITIES: no swelling NEUROLOGIC:  Alert and oriented x 3 PSYCHIATRIC:  Normal affect   ASSESSMENT:    1. Coronary artery disease involving native coronary artery of native heart without angina pectoris   2. Ischemic cardiomyopathy   3. Essential hypertension   4. Type 2 diabetes mellitus without complication, without long-term current use of insulin ([place])   5. ICD (implantable cardioverter-defibrillator) in place    PLAN:    In order of problems listed above:  1. Coronary disease stable  from that point review we will continue present management. 2. Ischemic cardiomyopathy last ejection fraction 5055% on appropriate medications which I will continue 3. Essential hypertension blood pressure well controlled continue present management. 4. Type 2 diabetes followed by 10 medicine team 5. ICD present, no recent discharges, interrogation revealed stable parameters   Medication Adjustments/Labs and Tests Ordered: Current medicines are reviewed at length with the patient today.  Concerns regarding medicines are outlined above.  No orders of the defined types were placed in this encounter.  Medication changes: No orders of the defined types were placed in this encounter.   Signed, Park Liter, MD, [facility] 10/25/2019 3:21 PM    [place]

## 2019-10-29 DIAGNOSIS — G4733 Obstructive sleep apnea (adult) (pediatric): Secondary | ICD-10-CM | POA: Diagnosis not present

## 2019-10-30 ENCOUNTER — Other Ambulatory Visit: Payer: Self-pay | Admitting: Family

## 2019-10-31 ENCOUNTER — Telehealth: Payer: Self-pay | Admitting: Cardiology

## 2019-10-31 DIAGNOSIS — M1712 Unilateral primary osteoarthritis, left knee: Secondary | ICD-10-CM | POA: Diagnosis not present

## 2019-10-31 NOTE — Telephone Encounter (Signed)
   Primary Cardiologist: [name], MD  Chart reviewed as part of pre-operative protocol coverage. Given past medical history and time since last visit, based on ACC/AHA guidelines, Daryl Rodriguez would be at acceptable risk for the planned procedure without further cardiovascular testing.   Dr. Agustin Cree, is it okay to hold Plavix for 5 days? He was doing well on cardiac stand point when seen by you 10/25/19.  Please forward your response to P CV DIV PREOP.   Thank you   Leanor Kail, PA 10/31/2019, 4:13 PM

## 2019-10-31 NOTE — Telephone Encounter (Signed)
   [facility] Pre-operative Risk Assessment    Request for surgical clearance:  1. What type of surgery is being performed? Lumbar Epidural steroid injection   2. When is this surgery scheduled? TBD, planning for 11/08/19  3. What type of clearance is required (medical clearance vs. Pharmacy clearance to hold med vs. Both)? medical  4. Are there any medications that need to be held prior to surgery and how long? Plavix held 5 days prior  5. Practice name and name of physician performing surgery? [facility], Dr. Donivan Scull  6. What is your office phone number: [phone]   7.   What is your office fax number: [phone]  8.   Anesthesia type (None, local, MAC, general) ? none   Daryl Rodriguez 10/31/2019, 4:02 PM  _________________________________________________________________   (provider comments below)

## 2019-11-01 NOTE — Telephone Encounter (Signed)
Thanks for your evaluation, it should be ok to hold his Plavix for 5 days

## 2019-11-01 NOTE — Telephone Encounter (Signed)
   I will route this recommendation to the requesting party via Epic fax function and remove from pre-op pool.  Please call with questions.  [place], [place] 11/01/2019, 11:18 AM

## 2019-11-01 NOTE — Telephone Encounter (Signed)
I spoke with Daryl Rodriguez and told her Dr Agustin Cree said it should be OK to hold Plavix for 5 days

## 2019-11-01 NOTE — Telephone Encounter (Signed)
Daryl Rodriguez, with [facility] is returning call in regards to a request to hold Plavix medication.   Please return call at [phone] (ext: [phone])

## 2019-11-06 ENCOUNTER — Ambulatory Visit (INDEPENDENT_AMBULATORY_CARE_PROVIDER_SITE_OTHER): Payer: Medicare HMO | Admitting: *Deleted

## 2019-11-06 DIAGNOSIS — I255 Ischemic cardiomyopathy: Secondary | ICD-10-CM | POA: Diagnosis not present

## 2019-11-06 LAB — CUP PACEART REMOTE DEVICE CHECK
Battery Remaining Longevity: 86 mo
Battery Remaining Percentage: 80 %
Battery Voltage: 3.01 V
Brady Statistic RV Percent Paced: 1 %
Date Time Interrogation Session: 20210303020016
HighPow Impedance: 71 Ohm
HighPow Impedance: 71 Ohm
Implantable Lead Implant Date: 20190528
Implantable Lead Location: 753860
Implantable Lead Model: 7122
Implantable Pulse Generator Implant Date: 20190528
Lead Channel Impedance Value: 440 Ohm
Lead Channel Pacing Threshold Amplitude: 1 V
Lead Channel Pacing Threshold Pulse Width: 0.5 ms
Lead Channel Sensing Intrinsic Amplitude: 9.8 mV
Lead Channel Setting Pacing Amplitude: 2.5 V
Lead Channel Setting Pacing Pulse Width: 0.5 ms
Lead Channel Setting Sensing Sensitivity: 0.5 mV
Pulse Gen Serial Number: 9781126

## 2019-11-06 NOTE — Progress Notes (Signed)
ICD Remote  

## 2019-11-08 DIAGNOSIS — M5116 Intervertebral disc disorders with radiculopathy, lumbar region: Secondary | ICD-10-CM | POA: Diagnosis not present

## 2019-11-08 DIAGNOSIS — M48062 Spinal stenosis, lumbar region with neurogenic claudication: Secondary | ICD-10-CM | POA: Diagnosis not present

## 2019-12-12 DIAGNOSIS — E785 Hyperlipidemia, unspecified: Secondary | ICD-10-CM | POA: Diagnosis not present

## 2019-12-12 DIAGNOSIS — I129 Hypertensive chronic kidney disease with stage 1 through stage 4 chronic kidney disease, or unspecified chronic kidney disease: Secondary | ICD-10-CM | POA: Diagnosis not present

## 2019-12-12 DIAGNOSIS — R0602 Shortness of breath: Secondary | ICD-10-CM | POA: Diagnosis not present

## 2019-12-12 DIAGNOSIS — E1122 Type 2 diabetes mellitus with diabetic chronic kidney disease: Secondary | ICD-10-CM | POA: Diagnosis not present

## 2019-12-19 DIAGNOSIS — N182 Chronic kidney disease, stage 2 (mild): Secondary | ICD-10-CM | POA: Diagnosis not present

## 2019-12-19 DIAGNOSIS — I129 Hypertensive chronic kidney disease with stage 1 through stage 4 chronic kidney disease, or unspecified chronic kidney disease: Secondary | ICD-10-CM | POA: Diagnosis not present

## 2019-12-19 DIAGNOSIS — D649 Anemia, unspecified: Secondary | ICD-10-CM | POA: Diagnosis not present

## 2019-12-19 DIAGNOSIS — K5900 Constipation, unspecified: Secondary | ICD-10-CM | POA: Diagnosis not present

## 2019-12-19 DIAGNOSIS — E7800 Pure hypercholesterolemia, unspecified: Secondary | ICD-10-CM | POA: Diagnosis not present

## 2019-12-19 DIAGNOSIS — R7989 Other specified abnormal findings of blood chemistry: Secondary | ICD-10-CM | POA: Diagnosis not present

## 2019-12-19 DIAGNOSIS — G629 Polyneuropathy, unspecified: Secondary | ICD-10-CM | POA: Diagnosis not present

## 2019-12-19 DIAGNOSIS — E1122 Type 2 diabetes mellitus with diabetic chronic kidney disease: Secondary | ICD-10-CM | POA: Diagnosis not present

## 2019-12-19 DIAGNOSIS — N400 Enlarged prostate without lower urinary tract symptoms: Secondary | ICD-10-CM | POA: Diagnosis not present

## 2019-12-27 ENCOUNTER — Telehealth: Payer: Self-pay | Admitting: Cardiology

## 2019-12-27 NOTE — Telephone Encounter (Signed)
     I went in pt 's chart to see when he needs his next office visit 

## 2020-01-09 DIAGNOSIS — R233 Spontaneous ecchymoses: Secondary | ICD-10-CM | POA: Diagnosis not present

## 2020-01-09 DIAGNOSIS — L821 Other seborrheic keratosis: Secondary | ICD-10-CM | POA: Diagnosis not present

## 2020-01-09 DIAGNOSIS — L570 Actinic keratosis: Secondary | ICD-10-CM | POA: Diagnosis not present

## 2020-01-09 DIAGNOSIS — L578 Other skin changes due to chronic exposure to nonionizing radiation: Secondary | ICD-10-CM | POA: Diagnosis not present

## 2020-01-15 ENCOUNTER — Encounter (HOSPITAL_COMMUNITY): Admission: EM | Disposition: E | Payer: Self-pay | Source: Home / Self Care | Attending: Cardiology

## 2020-01-15 ENCOUNTER — Inpatient Hospital Stay (HOSPITAL_COMMUNITY): Payer: Medicare HMO

## 2020-01-15 ENCOUNTER — Encounter (HOSPITAL_COMMUNITY): Payer: Self-pay | Admitting: *Deleted

## 2020-01-15 ENCOUNTER — Other Ambulatory Visit: Payer: Self-pay

## 2020-01-15 ENCOUNTER — Inpatient Hospital Stay (HOSPITAL_COMMUNITY)
Admission: EM | Admit: 2020-01-15 | Discharge: 2020-02-04 | DRG: 270 | Disposition: E | Payer: Medicare HMO | Attending: Cardiology | Admitting: Cardiology

## 2020-01-15 ENCOUNTER — Emergency Department (HOSPITAL_COMMUNITY): Payer: Medicare HMO

## 2020-01-15 DIAGNOSIS — I5043 Acute on chronic combined systolic (congestive) and diastolic (congestive) heart failure: Secondary | ICD-10-CM | POA: Diagnosis not present

## 2020-01-15 DIAGNOSIS — J9601 Acute respiratory failure with hypoxia: Secondary | ICD-10-CM | POA: Diagnosis not present

## 2020-01-15 DIAGNOSIS — I10 Essential (primary) hypertension: Secondary | ICD-10-CM | POA: Diagnosis not present

## 2020-01-15 DIAGNOSIS — Z4502 Encounter for adjustment and management of automatic implantable cardiac defibrillator: Secondary | ICD-10-CM

## 2020-01-15 DIAGNOSIS — R0602 Shortness of breath: Secondary | ICD-10-CM | POA: Diagnosis not present

## 2020-01-15 DIAGNOSIS — E876 Hypokalemia: Secondary | ICD-10-CM | POA: Diagnosis not present

## 2020-01-15 DIAGNOSIS — E875 Hyperkalemia: Secondary | ICD-10-CM | POA: Diagnosis not present

## 2020-01-15 DIAGNOSIS — I472 Ventricular tachycardia, unspecified: Secondary | ICD-10-CM

## 2020-01-15 DIAGNOSIS — E118 Type 2 diabetes mellitus with unspecified complications: Secondary | ICD-10-CM | POA: Diagnosis present

## 2020-01-15 DIAGNOSIS — J810 Acute pulmonary edema: Secondary | ICD-10-CM | POA: Diagnosis present

## 2020-01-15 DIAGNOSIS — Z515 Encounter for palliative care: Secondary | ICD-10-CM | POA: Diagnosis not present

## 2020-01-15 DIAGNOSIS — Z87891 Personal history of nicotine dependence: Secondary | ICD-10-CM

## 2020-01-15 DIAGNOSIS — I509 Heart failure, unspecified: Secondary | ICD-10-CM | POA: Diagnosis not present

## 2020-01-15 DIAGNOSIS — Z823 Family history of stroke: Secondary | ICD-10-CM

## 2020-01-15 DIAGNOSIS — R579 Shock, unspecified: Secondary | ICD-10-CM | POA: Diagnosis not present

## 2020-01-15 DIAGNOSIS — E872 Acidosis, unspecified: Secondary | ICD-10-CM | POA: Diagnosis present

## 2020-01-15 DIAGNOSIS — Z452 Encounter for adjustment and management of vascular access device: Secondary | ICD-10-CM | POA: Diagnosis not present

## 2020-01-15 DIAGNOSIS — N400 Enlarged prostate without lower urinary tract symptoms: Secondary | ICD-10-CM | POA: Diagnosis present

## 2020-01-15 DIAGNOSIS — I130 Hypertensive heart and chronic kidney disease with heart failure and stage 1 through stage 4 chronic kidney disease, or unspecified chronic kidney disease: Secondary | ICD-10-CM | POA: Diagnosis present

## 2020-01-15 DIAGNOSIS — J811 Chronic pulmonary edema: Secondary | ICD-10-CM | POA: Diagnosis not present

## 2020-01-15 DIAGNOSIS — D62 Acute posthemorrhagic anemia: Secondary | ICD-10-CM | POA: Diagnosis not present

## 2020-01-15 DIAGNOSIS — E1122 Type 2 diabetes mellitus with diabetic chronic kidney disease: Secondary | ICD-10-CM | POA: Diagnosis present

## 2020-01-15 DIAGNOSIS — R3589 Other polyuria: Secondary | ICD-10-CM

## 2020-01-15 DIAGNOSIS — I2510 Atherosclerotic heart disease of native coronary artery without angina pectoris: Secondary | ICD-10-CM | POA: Diagnosis present

## 2020-01-15 DIAGNOSIS — Z7982 Long term (current) use of aspirin: Secondary | ICD-10-CM

## 2020-01-15 DIAGNOSIS — N390 Urinary tract infection, site not specified: Secondary | ICD-10-CM | POA: Diagnosis not present

## 2020-01-15 DIAGNOSIS — E785 Hyperlipidemia, unspecified: Secondary | ICD-10-CM | POA: Diagnosis not present

## 2020-01-15 DIAGNOSIS — N170 Acute kidney failure with tubular necrosis: Secondary | ICD-10-CM | POA: Diagnosis present

## 2020-01-15 DIAGNOSIS — I272 Pulmonary hypertension, unspecified: Secondary | ICD-10-CM | POA: Diagnosis present

## 2020-01-15 DIAGNOSIS — R0789 Other chest pain: Secondary | ICD-10-CM | POA: Diagnosis not present

## 2020-01-15 DIAGNOSIS — I252 Old myocardial infarction: Secondary | ICD-10-CM | POA: Diagnosis not present

## 2020-01-15 DIAGNOSIS — E1151 Type 2 diabetes mellitus with diabetic peripheral angiopathy without gangrene: Secondary | ICD-10-CM | POA: Diagnosis present

## 2020-01-15 DIAGNOSIS — Z7189 Other specified counseling: Secondary | ICD-10-CM

## 2020-01-15 DIAGNOSIS — R079 Chest pain, unspecified: Secondary | ICD-10-CM | POA: Diagnosis not present

## 2020-01-15 DIAGNOSIS — I25110 Atherosclerotic heart disease of native coronary artery with unstable angina pectoris: Secondary | ICD-10-CM | POA: Diagnosis present

## 2020-01-15 DIAGNOSIS — Z20822 Contact with and (suspected) exposure to covid-19: Secondary | ICD-10-CM | POA: Diagnosis present

## 2020-01-15 DIAGNOSIS — I255 Ischemic cardiomyopathy: Secondary | ICD-10-CM | POA: Diagnosis present

## 2020-01-15 DIAGNOSIS — I110 Hypertensive heart disease with heart failure: Secondary | ICD-10-CM | POA: Diagnosis not present

## 2020-01-15 DIAGNOSIS — Z66 Do not resuscitate: Secondary | ICD-10-CM | POA: Diagnosis not present

## 2020-01-15 DIAGNOSIS — G4733 Obstructive sleep apnea (adult) (pediatric): Secondary | ICD-10-CM | POA: Diagnosis not present

## 2020-01-15 DIAGNOSIS — R570 Cardiogenic shock: Secondary | ICD-10-CM | POA: Diagnosis not present

## 2020-01-15 DIAGNOSIS — Z955 Presence of coronary angioplasty implant and graft: Secondary | ICD-10-CM

## 2020-01-15 DIAGNOSIS — E8770 Fluid overload, unspecified: Secondary | ICD-10-CM | POA: Diagnosis not present

## 2020-01-15 DIAGNOSIS — E871 Hypo-osmolality and hyponatremia: Secondary | ICD-10-CM | POA: Diagnosis not present

## 2020-01-15 DIAGNOSIS — N183 Chronic kidney disease, stage 3 unspecified: Secondary | ICD-10-CM | POA: Diagnosis not present

## 2020-01-15 DIAGNOSIS — Z7902 Long term (current) use of antithrombotics/antiplatelets: Secondary | ICD-10-CM

## 2020-01-15 DIAGNOSIS — I480 Paroxysmal atrial fibrillation: Secondary | ICD-10-CM | POA: Diagnosis present

## 2020-01-15 DIAGNOSIS — E559 Vitamin D deficiency, unspecified: Secondary | ICD-10-CM | POA: Diagnosis present

## 2020-01-15 DIAGNOSIS — Z8249 Family history of ischemic heart disease and other diseases of the circulatory system: Secondary | ICD-10-CM

## 2020-01-15 DIAGNOSIS — T82528A Displacement of other cardiac and vascular devices and implants, initial encounter: Secondary | ICD-10-CM

## 2020-01-15 DIAGNOSIS — E1129 Type 2 diabetes mellitus with other diabetic kidney complication: Secondary | ICD-10-CM | POA: Diagnosis not present

## 2020-01-15 DIAGNOSIS — I2111 ST elevation (STEMI) myocardial infarction involving right coronary artery: Secondary | ICD-10-CM | POA: Diagnosis present

## 2020-01-15 DIAGNOSIS — I5042 Chronic combined systolic (congestive) and diastolic (congestive) heart failure: Secondary | ICD-10-CM | POA: Diagnosis not present

## 2020-01-15 DIAGNOSIS — T508X5A Adverse effect of diagnostic agents, initial encounter: Secondary | ICD-10-CM | POA: Diagnosis not present

## 2020-01-15 DIAGNOSIS — I214 Non-ST elevation (NSTEMI) myocardial infarction: Principal | ICD-10-CM | POA: Diagnosis present

## 2020-01-15 DIAGNOSIS — I4891 Unspecified atrial fibrillation: Secondary | ICD-10-CM | POA: Diagnosis not present

## 2020-01-15 DIAGNOSIS — E1165 Type 2 diabetes mellitus with hyperglycemia: Secondary | ICD-10-CM | POA: Diagnosis not present

## 2020-01-15 DIAGNOSIS — N141 Nephropathy induced by other drugs, medicaments and biological substances: Secondary | ICD-10-CM | POA: Diagnosis not present

## 2020-01-15 DIAGNOSIS — R0902 Hypoxemia: Secondary | ICD-10-CM | POA: Diagnosis not present

## 2020-01-15 DIAGNOSIS — N179 Acute kidney failure, unspecified: Secondary | ICD-10-CM | POA: Diagnosis not present

## 2020-01-15 DIAGNOSIS — I5021 Acute systolic (congestive) heart failure: Secondary | ICD-10-CM | POA: Diagnosis not present

## 2020-01-15 DIAGNOSIS — R34 Anuria and oliguria: Secondary | ICD-10-CM | POA: Diagnosis not present

## 2020-01-15 DIAGNOSIS — Z7984 Long term (current) use of oral hypoglycemic drugs: Secondary | ICD-10-CM

## 2020-01-15 HISTORY — PX: RIGHT/LEFT HEART CATH AND CORONARY ANGIOGRAPHY: CATH118266

## 2020-01-15 HISTORY — PX: IABP INSERTION: CATH118242

## 2020-01-15 LAB — BRAIN NATRIURETIC PEPTIDE: B Natriuretic Peptide: 597.8 pg/mL — ABNORMAL HIGH (ref 0.0–100.0)

## 2020-01-15 LAB — POCT I-STAT 7, (LYTES, BLD GAS, ICA,H+H)
Acid-Base Excess: 0 mmol/L (ref 0.0–2.0)
Acid-base deficit: 1 mmol/L (ref 0.0–2.0)
Bicarbonate: 23.8 mmol/L (ref 20.0–28.0)
Bicarbonate: 24.7 mmol/L (ref 20.0–28.0)
Calcium, Ion: 1.23 mmol/L (ref 1.15–1.40)
Calcium, Ion: 1.2 mmol/L (ref 1.15–1.40)
HCT: 35 % — ABNORMAL LOW (ref 39.0–52.0)
HCT: 34 % — ABNORMAL LOW (ref 39.0–52.0)
Hemoglobin: 11.9 g/dL — ABNORMAL LOW (ref 13.0–17.0)
Hemoglobin: 11.6 g/dL — ABNORMAL LOW (ref 13.0–17.0)
O2 Saturation: 90 %
O2 Saturation: 99 %
Patient temperature: 99.6
Potassium: 3.9 mmol/L (ref 3.5–5.1)
Potassium: 3.6 mmol/L (ref 3.5–5.1)
Sodium: 140 mmol/L (ref 135–145)
Sodium: 138 mmol/L (ref 135–145)
TCO2: 25 mmol/L (ref 22–32)
TCO2: 26 mmol/L (ref 22–32)
pCO2 arterial: 39.7 mmHg (ref 32.0–48.0)
pCO2 arterial: 38.6 mmHg (ref 32.0–48.0)
pH, Arterial: 7.386 (ref 7.350–7.450)
pH, Arterial: 7.416 (ref 7.350–7.450)
pO2, Arterial: 59 mmHg — ABNORMAL LOW (ref 83.0–108.0)
pO2, Arterial: 158 mmHg — ABNORMAL HIGH (ref 83.0–108.0)

## 2020-01-15 LAB — BASIC METABOLIC PANEL
Anion gap: 10 (ref 5–15)
Anion gap: 7 (ref 5–15)
BUN: 23 mg/dL (ref 8–23)
BUN: 18 mg/dL (ref 8–23)
CO2: 24 mmol/L (ref 22–32)
CO2: 15 mmol/L — ABNORMAL LOW (ref 22–32)
Calcium: 8.9 mg/dL (ref 8.9–10.3)
Calcium: 5 mg/dL — CL (ref 8.9–10.3)
Chloride: 104 mmol/L (ref 98–111)
Chloride: 122 mmol/L — ABNORMAL HIGH (ref 98–111)
Creatinine, Ser: 1.64 mg/dL — ABNORMAL HIGH (ref 0.61–1.24)
Creatinine, Ser: 0.83 mg/dL (ref 0.61–1.24)
GFR calc Af Amer: 44 mL/min — ABNORMAL LOW (ref >60)
GFR calc Af Amer: >60 mL/min (ref >60)
GFR calc non Af Amer: 38 mL/min — ABNORMAL LOW (ref >60)
GFR calc non Af Amer: >60 mL/min (ref >60)
Glucose, Bld: 252 mg/dL — ABNORMAL HIGH (ref 70–99)
Glucose, Bld: 123 mg/dL — ABNORMAL HIGH (ref 70–99)
Potassium: 4.5 mmol/L (ref 3.5–5.1)
Potassium: 2.4 mmol/L — CL (ref 3.5–5.1)
Sodium: 138 mmol/L (ref 135–145)
Sodium: 144 mmol/L (ref 135–145)

## 2020-01-15 LAB — TSH: TSH: 0.693 u[IU]/mL (ref 0.350–4.500)

## 2020-01-15 LAB — POCT I-STAT, CHEM 8
BUN: 25 mg/dL — ABNORMAL HIGH (ref 8–23)
Calcium, Ion: 1.23 mmol/L (ref 1.15–1.40)
Chloride: 103 mmol/L (ref 98–111)
Creatinine, Ser: 1.5 mg/dL — ABNORMAL HIGH (ref 0.61–1.24)
Glucose, Bld: 186 mg/dL — ABNORMAL HIGH (ref 70–99)
HCT: 33 % — ABNORMAL LOW (ref 39.0–52.0)
Hemoglobin: 11.2 g/dL — ABNORMAL LOW (ref 13.0–17.0)
Potassium: 3.5 mmol/L (ref 3.5–5.1)
Sodium: 139 mmol/L (ref 135–145)
TCO2: 26 mmol/L (ref 22–32)

## 2020-01-15 LAB — POCT I-STAT EG7
Acid-Base Excess: 0 mmol/L (ref 0.0–2.0)
Acid-base deficit: 1 mmol/L (ref 0.0–2.0)
Bicarbonate: 24.9 mmol/L (ref 20.0–28.0)
Bicarbonate: 25.9 mmol/L (ref 20.0–28.0)
Calcium, Ion: 1.12 mmol/L — ABNORMAL LOW (ref 1.15–1.40)
Calcium, Ion: 1.18 mmol/L (ref 1.15–1.40)
HCT: 33 % — ABNORMAL LOW (ref 39.0–52.0)
HCT: 34 % — ABNORMAL LOW (ref 39.0–52.0)
Hemoglobin: 11.2 g/dL — ABNORMAL LOW (ref 13.0–17.0)
Hemoglobin: 11.6 g/dL — ABNORMAL LOW (ref 13.0–17.0)
O2 Saturation: 45 %
O2 Saturation: 47 %
Potassium: 3.6 mmol/L (ref 3.5–5.1)
Potassium: 3.8 mmol/L (ref 3.5–5.1)
Sodium: 141 mmol/L (ref 135–145)
Sodium: 142 mmol/L (ref 135–145)
TCO2: 26 mmol/L (ref 22–32)
TCO2: 27 mmol/L (ref 22–32)
pCO2, Ven: 43.4 mmHg — ABNORMAL LOW (ref 44.0–60.0)
pCO2, Ven: 44.9 mmHg (ref 44.0–60.0)
pH, Ven: 7.368 (ref 7.250–7.430)
pH, Ven: 7.368 (ref 7.250–7.430)
pO2, Ven: 26 mmHg — CL (ref 32.0–45.0)
pO2, Ven: 27 mmHg — CL (ref 32.0–45.0)

## 2020-01-15 LAB — CBC WITH DIFFERENTIAL/PLATELET
Abs Immature Granulocytes: 0.06 10*3/uL (ref 0.00–0.07)
Basophils Absolute: 0 10*3/uL (ref 0.0–0.1)
Basophils Relative: 0 %
Eosinophils Absolute: 0 10*3/uL (ref 0.0–0.5)
Eosinophils Relative: 0 %
HCT: 36.2 % — ABNORMAL LOW (ref 39.0–52.0)
Hemoglobin: 11.8 g/dL — ABNORMAL LOW (ref 13.0–17.0)
Immature Granulocytes: 1 %
Lymphocytes Relative: 5 %
Lymphs Abs: 0.4 10*3/uL — ABNORMAL LOW (ref 0.7–4.0)
MCH: 33.6 pg (ref 26.0–34.0)
MCHC: 32.6 g/dL (ref 30.0–36.0)
MCV: 103.1 fL — ABNORMAL HIGH (ref 80.0–100.0)
Monocytes Absolute: 0.7 10*3/uL (ref 0.1–1.0)
Monocytes Relative: 8 %
Neutro Abs: 7.1 10*3/uL (ref 1.7–7.7)
Neutrophils Relative %: 86 %
Platelets: 171 10*3/uL (ref 150–400)
RBC: 3.51 MIL/uL — ABNORMAL LOW (ref 4.22–5.81)
RDW: 14.7 % (ref 11.5–15.5)
WBC: 8.3 10*3/uL (ref 4.0–10.5)
nRBC: 0 % (ref 0.0–0.2)

## 2020-01-15 LAB — HEMOGLOBIN A1C
Hgb A1c MFr Bld: 6.5 % — ABNORMAL HIGH (ref 4.8–5.6)
Mean Plasma Glucose: 139.85 mg/dL

## 2020-01-15 LAB — SARS CORONAVIRUS 2 BY RT PCR (HOSPITAL ORDER, PERFORMED IN ~~LOC~~ HOSPITAL LAB): SARS Coronavirus 2: NEGATIVE

## 2020-01-15 LAB — CBC
HCT: 35.1 % — ABNORMAL LOW (ref 39.0–52.0)
Hemoglobin: 11.6 g/dL — ABNORMAL LOW (ref 13.0–17.0)
MCH: 33.1 pg (ref 26.0–34.0)
MCHC: 33 g/dL (ref 30.0–36.0)
MCV: 100.3 fL — ABNORMAL HIGH (ref 80.0–100.0)
Platelets: 175 10*3/uL (ref 150–400)
RBC: 3.5 MIL/uL — ABNORMAL LOW (ref 4.22–5.81)
RDW: 14.8 % (ref 11.5–15.5)
WBC: 10.7 10*3/uL — ABNORMAL HIGH (ref 4.0–10.5)
nRBC: 0 % (ref 0.0–0.2)

## 2020-01-15 LAB — PROTIME-INR
INR: 1.2 (ref 0.8–1.2)
Prothrombin Time: 14.7 seconds (ref 11.4–15.2)

## 2020-01-15 LAB — GLUCOSE, CAPILLARY: Glucose-Capillary: 164 mg/dL — ABNORMAL HIGH (ref 70–99)

## 2020-01-15 LAB — LACTIC ACID, PLASMA
Lactic Acid, Venous: 2.5 mmol/L (ref 0.5–1.9)
Lactic Acid, Venous: 1.7 mmol/L (ref 0.5–1.9)

## 2020-01-15 LAB — MAGNESIUM: Magnesium: 1.1 mg/dL — ABNORMAL LOW (ref 1.7–2.4)

## 2020-01-15 LAB — TROPONIN I (HIGH SENSITIVITY)
Troponin I (High Sensitivity): 603 ng/L (ref <18)
Troponin I (High Sensitivity): 2319 ng/L (ref <18)

## 2020-01-15 LAB — COOXEMETRY PANEL
Carboxyhemoglobin: 0.9 % (ref 0.5–1.5)
Methemoglobin: 0.5 % (ref 0.0–1.5)
O2 Saturation: 92.6 %
Total hemoglobin: 11.9 g/dL — ABNORMAL LOW (ref 12.0–16.0)

## 2020-01-15 SURGERY — RIGHT/LEFT HEART CATH AND CORONARY ANGIOGRAPHY
Anesthesia: LOCAL

## 2020-01-15 MED ORDER — SODIUM CHLORIDE 0.9 % IV SOLN: INTRAVENOUS | Status: DC | PRN

## 2020-01-15 MED ORDER — HEPARIN (PORCINE) IN NACL 1000-0.9 UT/500ML-% IV SOLN
INTRAVENOUS | Status: AC
  Filled 2020-01-15: qty 500

## 2020-01-15 MED ORDER — LIDOCAINE HCL (PF) 1 % IJ SOLN
INTRAMUSCULAR | Status: DC | PRN
  Administered 2020-01-15: 15 mL
  Administered 2020-01-15: 2 mL

## 2020-01-15 MED ORDER — FUROSEMIDE 10 MG/ML IJ SOLN
INTRAMUSCULAR | Status: DC | PRN
  Administered 2020-01-15: 80 mg via INTRAVENOUS

## 2020-01-15 MED ORDER — GABAPENTIN 300 MG PO CAPS
300.0000 mg | ORAL_CAPSULE | Freq: Every day | ORAL | Status: DC
  Administered 2020-01-16 – 2020-01-29 (×14): 300 mg via ORAL
  Filled 2020-01-15 (×14): qty 1

## 2020-01-15 MED ORDER — ASPIRIN 81 MG PO CHEW: 81.0000 mg | CHEWABLE_TABLET | Freq: Every day | ORAL | Status: DC

## 2020-01-15 MED ORDER — INSULIN ASPART 100 UNIT/ML ~~LOC~~ SOLN
0.0000 [IU] | SUBCUTANEOUS | Status: DC
  Administered 2020-01-15: 3 [IU] via SUBCUTANEOUS
  Administered 2020-01-16: 2 [IU] via SUBCUTANEOUS
  Administered 2020-01-16: 3 [IU] via SUBCUTANEOUS
  Administered 2020-01-16 (×2): 2 [IU] via SUBCUTANEOUS
  Administered 2020-01-16: 3 [IU] via SUBCUTANEOUS
  Administered 2020-01-16: 2 [IU] via SUBCUTANEOUS
  Administered 2020-01-17: 3 [IU] via SUBCUTANEOUS
  Administered 2020-01-17 (×3): 2 [IU] via SUBCUTANEOUS
  Administered 2020-01-18: 5 [IU] via SUBCUTANEOUS
  Administered 2020-01-18: 3 [IU] via SUBCUTANEOUS
  Administered 2020-01-18: 2 [IU] via SUBCUTANEOUS
  Administered 2020-01-19: 3 [IU] via SUBCUTANEOUS
  Administered 2020-01-19: 2 [IU] via SUBCUTANEOUS
  Administered 2020-01-19 (×3): 3 [IU] via SUBCUTANEOUS
  Administered 2020-01-20: 2 [IU] via SUBCUTANEOUS
  Administered 2020-01-20 (×3): 3 [IU] via SUBCUTANEOUS
  Administered 2020-01-20: 5 [IU] via SUBCUTANEOUS
  Administered 2020-01-20 – 2020-01-21 (×2): 3 [IU] via SUBCUTANEOUS
  Administered 2020-01-21: 5 [IU] via SUBCUTANEOUS
  Administered 2020-01-21: 2 [IU] via SUBCUTANEOUS
  Administered 2020-01-21: 3 [IU] via SUBCUTANEOUS
  Administered 2020-01-21: 2 [IU] via SUBCUTANEOUS
  Administered 2020-01-21 – 2020-01-22 (×2): 5 [IU] via SUBCUTANEOUS
  Administered 2020-01-22 – 2020-01-23 (×6): 3 [IU] via SUBCUTANEOUS
  Administered 2020-01-23: 2 [IU] via SUBCUTANEOUS
  Administered 2020-01-23 – 2020-01-24 (×6): 3 [IU] via SUBCUTANEOUS
  Administered 2020-01-24: 2 [IU] via SUBCUTANEOUS
  Administered 2020-01-24 – 2020-01-25 (×6): 3 [IU] via SUBCUTANEOUS
  Administered 2020-01-25: 5 [IU] via SUBCUTANEOUS
  Administered 2020-01-25: 2 [IU] via SUBCUTANEOUS
  Administered 2020-01-25 (×2): 3 [IU] via SUBCUTANEOUS
  Administered 2020-01-26: 2 [IU] via SUBCUTANEOUS
  Administered 2020-01-26: 3 [IU] via SUBCUTANEOUS
  Administered 2020-01-26: 2 [IU] via SUBCUTANEOUS
  Administered 2020-01-26 – 2020-01-27 (×5): 3 [IU] via SUBCUTANEOUS

## 2020-01-15 MED ORDER — LIDOCAINE HCL (PF) 1 % IJ SOLN
INTRAMUSCULAR | Status: AC
  Filled 2020-01-15: qty 30

## 2020-01-15 MED ORDER — SODIUM CHLORIDE 0.9 % IV SOLN
INTRAVENOUS | Status: AC | PRN
  Administered 2020-01-15: 10 mL/h via INTRAVENOUS

## 2020-01-15 MED ORDER — SODIUM CHLORIDE 0.9% FLUSH: 10.0000 mL | Freq: Two times a day (BID) | INTRAVENOUS | Status: DC

## 2020-01-15 MED ORDER — MILRINONE LACTATE IN DEXTROSE 20-5 MG/100ML-% IV SOLN
INTRAVENOUS | Status: AC | PRN
  Administered 2020-01-15: 0.25 ug/kg/min via INTRAVENOUS

## 2020-01-15 MED ORDER — POTASSIUM CHLORIDE 10 MEQ/50ML IV SOLN
10.0000 meq | INTRAVENOUS | Status: AC
  Administered 2020-01-16 (×2): 10 meq via INTRAVENOUS
  Filled 2020-01-15 (×2): qty 50

## 2020-01-15 MED ORDER — SODIUM CHLORIDE 0.9 % IV SOLN: INTRAVENOUS | Status: DC

## 2020-01-15 MED ORDER — ATORVASTATIN CALCIUM 80 MG PO TABS
80.0000 mg | ORAL_TABLET | Freq: Every day | ORAL | Status: DC
  Administered 2020-01-16 – 2020-01-28 (×12): 80 mg via ORAL
  Filled 2020-01-15 (×14): qty 1

## 2020-01-15 MED ORDER — CHLORHEXIDINE GLUCONATE CLOTH 2 % EX PADS
6.0000 | MEDICATED_PAD | Freq: Every day | CUTANEOUS | Status: DC
  Administered 2020-01-15 – 2020-01-23 (×3): 6 via TOPICAL

## 2020-01-15 MED ORDER — FUROSEMIDE 10 MG/ML IJ SOLN
40.0000 mg | Freq: Once | INTRAMUSCULAR | Status: AC
  Administered 2020-01-15: 16:00:00 40 mg via INTRAVENOUS
  Filled 2020-01-15: qty 4

## 2020-01-15 MED ORDER — HEPARIN (PORCINE) 25000 UT/250ML-% IV SOLN
1150.0000 [IU]/h | INTRAVENOUS | Status: DC
  Filled 2020-01-15: qty 250

## 2020-01-15 MED ORDER — POTASSIUM CHLORIDE CRYS ER 20 MEQ PO TBCR
20.0000 meq | EXTENDED_RELEASE_TABLET | Freq: Once | ORAL | Status: AC
  Administered 2020-01-15: 20 meq via ORAL
  Filled 2020-01-15: qty 1

## 2020-01-15 MED ORDER — IOHEXOL 350 MG/ML SOLN
INTRAVENOUS | Status: DC | PRN
  Administered 2020-01-15: 20:00:00 70 mL

## 2020-01-15 MED ORDER — SODIUM CHLORIDE 0.9% IV SOLUTION: INTRAVENOUS | Status: DC

## 2020-01-15 MED ORDER — FUROSEMIDE 10 MG/ML IJ SOLN: 80.0000 mg | Freq: Once | INTRAMUSCULAR | Status: DC

## 2020-01-15 MED ORDER — MILRINONE LACTATE IN DEXTROSE 20-5 MG/100ML-% IV SOLN
0.1250 ug/kg/min | INTRAVENOUS | Status: DC
  Filled 2020-01-15: qty 100

## 2020-01-15 MED ORDER — INSULIN ASPART 100 UNIT/ML ~~LOC~~ SOLN
0.0000 [IU] | Freq: Three times a day (TID) | SUBCUTANEOUS | Status: DC
Start: 2020-01-16 — End: 2020-01-15

## 2020-01-15 MED ORDER — VITAMIN D (ERGOCALCIFEROL) 1.25 MG (50000 UNIT) PO CAPS
50000.0000 [IU] | ORAL_CAPSULE | ORAL | Status: DC
  Administered 2020-01-16 – 2020-01-23 (×2): 50000 [IU] via ORAL
  Filled 2020-01-15 (×2): qty 1

## 2020-01-15 MED ORDER — SODIUM CHLORIDE 0.9% IV SOLUTION: INTRAVENOUS | Status: DC | PRN

## 2020-01-15 MED ORDER — NITROGLYCERIN 1 MG/10 ML FOR IR/CATH LAB
INTRA_ARTERIAL | Status: AC
  Filled 2020-01-15: qty 10

## 2020-01-15 MED ORDER — ASPIRIN 81 MG PO CHEW
324.0000 mg | CHEWABLE_TABLET | Freq: Once | ORAL | Status: DC
  Filled 2020-01-15: qty 4

## 2020-01-15 MED ORDER — SODIUM CHLORIDE 0.9% FLUSH: 3.0000 mL | INTRAVENOUS | Status: DC | PRN

## 2020-01-15 MED ORDER — NITROGLYCERIN 0.4 MG SL SUBL
SUBLINGUAL_TABLET | SUBLINGUAL | Status: AC
  Filled 2020-01-15: qty 1

## 2020-01-15 MED ORDER — ONDANSETRON HCL 4 MG/2ML IJ SOLN
4.0000 mg | Freq: Four times a day (QID) | INTRAMUSCULAR | Status: DC | PRN
  Administered 2020-01-23 – 2020-01-28 (×3): 4 mg via INTRAVENOUS
  Filled 2020-01-15 (×3): qty 2

## 2020-01-15 MED ORDER — FUROSEMIDE 10 MG/ML IJ SOLN
INTRAMUSCULAR | Status: AC
  Filled 2020-01-15: qty 8

## 2020-01-15 MED ORDER — HEPARIN SODIUM (PORCINE) 1000 UNIT/ML IJ SOLN
INTRAMUSCULAR | Status: AC
  Filled 2020-01-15: qty 1

## 2020-01-15 MED ORDER — ACETAMINOPHEN 325 MG PO TABS
650.0000 mg | ORAL_TABLET | ORAL | Status: DC | PRN
  Administered 2020-01-19 – 2020-01-29 (×5): 650 mg via ORAL
  Filled 2020-01-15 (×5): qty 2

## 2020-01-15 MED ORDER — METOPROLOL SUCCINATE ER 50 MG PO TB24
50.0000 mg | ORAL_TABLET | Freq: Every day | ORAL | Status: DC
  Administered 2020-01-16: 50 mg via ORAL
  Filled 2020-01-15: qty 1

## 2020-01-15 MED ORDER — GABAPENTIN 300 MG PO CAPS
600.0000 mg | ORAL_CAPSULE | Freq: Every day | ORAL | Status: DC
  Administered 2020-01-15 – 2020-01-29 (×13): 600 mg via ORAL
  Filled 2020-01-15 (×15): qty 2

## 2020-01-15 MED ORDER — POLYSACCHARIDE IRON COMPLEX 150 MG PO CAPS
150.0000 mg | ORAL_CAPSULE | Freq: Every day | ORAL | Status: DC
  Administered 2020-01-16 – 2020-01-28 (×13): 150 mg via ORAL
  Filled 2020-01-15 (×14): qty 1

## 2020-01-15 MED ORDER — HEPARIN (PORCINE) IN NACL 1000-0.9 UT/500ML-% IV SOLN
INTRAVENOUS | Status: DC | PRN
  Administered 2020-01-15 (×2): 500 mL

## 2020-01-15 MED ORDER — SODIUM CHLORIDE 0.9% FLUSH
10.0000 mL | INTRAVENOUS | Status: DC | PRN
  Administered 2020-01-15: 21:00:00 10 mL

## 2020-01-15 MED ORDER — VITAMIN B-12 1000 MCG PO TABS
500.0000 ug | ORAL_TABLET | Freq: Every day | ORAL | Status: DC
  Administered 2020-01-16 – 2020-01-29 (×14): 500 ug via ORAL
  Filled 2020-01-15 (×15): qty 1

## 2020-01-15 MED ORDER — MILRINONE LACTATE IN DEXTROSE 20-5 MG/100ML-% IV SOLN
0.1250 ug/kg/min | INTRAVENOUS | Status: DC
  Administered 2020-01-16 (×2): 0.25 ug/kg/min via INTRAVENOUS
  Administered 2020-01-17 – 2020-01-18 (×2): 0.125 ug/kg/min via INTRAVENOUS
  Filled 2020-01-15 (×4): qty 100

## 2020-01-15 MED ORDER — FUROSEMIDE 10 MG/ML IJ SOLN
80.0000 mg | Freq: Once | INTRAMUSCULAR | Status: AC
  Administered 2020-01-15: 80 mg via INTRAVENOUS
  Filled 2020-01-15: qty 8

## 2020-01-15 MED ORDER — AMIODARONE HCL 200 MG PO TABS
200.0000 mg | ORAL_TABLET | ORAL | Status: DC
  Administered 2020-01-16: 200 mg via ORAL
  Filled 2020-01-15: qty 1

## 2020-01-15 MED ORDER — HEPARIN SODIUM (PORCINE) 1000 UNIT/ML IJ SOLN
INTRAMUSCULAR | Status: DC | PRN
  Administered 2020-01-15 (×2): 4000 [IU] via INTRAVENOUS

## 2020-01-15 MED ORDER — CLOPIDOGREL BISULFATE 75 MG PO TABS: 75.0000 mg | ORAL_TABLET | Freq: Every day | ORAL | Status: DC

## 2020-01-15 MED ORDER — HEPARIN SODIUM (PORCINE) 5000 UNIT/ML IJ SOLN
4000.0000 [IU] | Freq: Once | INTRAMUSCULAR | Status: AC
  Administered 2020-01-15: 18:00:00 4000 [IU] via INTRAVENOUS

## 2020-01-15 MED ORDER — NITROGLYCERIN IN D5W 200-5 MCG/ML-% IV SOLN
0.0000 ug/min | INTRAVENOUS | Status: DC
  Administered 2020-01-15: 5 ug/min via INTRAVENOUS

## 2020-01-15 MED ORDER — NITROGLYCERIN 0.4 MG SL SUBL
0.4000 mg | SUBLINGUAL_TABLET | SUBLINGUAL | Status: DC | PRN
  Administered 2020-01-17: 0.4 mg via SUBLINGUAL
  Filled 2020-01-15: qty 1

## 2020-01-15 MED ORDER — VERAPAMIL HCL 2.5 MG/ML IV SOLN
INTRAVENOUS | Status: AC
  Filled 2020-01-15: qty 2

## 2020-01-15 MED ORDER — SODIUM CHLORIDE 0.9% FLUSH
3.0000 mL | Freq: Two times a day (BID) | INTRAVENOUS | Status: DC
  Administered 2020-01-17 – 2020-01-23 (×9): 3 mL via INTRAVENOUS
  Administered 2020-01-24: 10 mL via INTRAVENOUS

## 2020-01-15 MED ORDER — ASPIRIN EC 81 MG PO TBEC: 81.0000 mg | DELAYED_RELEASE_TABLET | Freq: Every day | ORAL | Status: DC

## 2020-01-15 MED ORDER — NITROGLYCERIN IN D5W 200-5 MCG/ML-% IV SOLN
INTRAVENOUS | Status: AC
  Filled 2020-01-15: qty 250

## 2020-01-15 MED ORDER — SODIUM CHLORIDE 0.9 % IV SOLN: INTRAVENOUS | Status: AC

## 2020-01-15 MED ORDER — SODIUM CHLORIDE 0.9 % IV SOLN: 250.0000 mL | INTRAVENOUS | Status: DC | PRN

## 2020-01-15 MED ORDER — CHLORHEXIDINE GLUCONATE CLOTH 2 % EX PADS
6.0000 | MEDICATED_PAD | Freq: Every day | CUTANEOUS | Status: DC
  Administered 2020-01-17 – 2020-01-25 (×8): 6 via TOPICAL

## 2020-01-15 MED ORDER — VERAPAMIL HCL 2.5 MG/ML IV SOLN
INTRAVENOUS | Status: DC | PRN
  Administered 2020-01-15: 5 mL via INTRA_ARTERIAL

## 2020-01-15 MED ORDER — HEPARIN (PORCINE) 25000 UT/250ML-% IV SOLN
1150.0000 [IU]/h | INTRAVENOUS | Status: DC
  Administered 2020-01-15 – 2020-01-16 (×2): 1050 [IU]/h via INTRAVENOUS
  Administered 2020-01-17: 1150 [IU]/h via INTRAVENOUS
  Filled 2020-01-15 (×2): qty 250

## 2020-01-15 MED ORDER — HEPARIN SODIUM (PORCINE) 5000 UNIT/ML IJ SOLN
INTRAMUSCULAR | Status: AC
  Filled 2020-01-15: qty 1

## 2020-01-15 MED ORDER — LATANOPROST 0.005 % OP SOLN
1.0000 [drp] | Freq: Every day | OPHTHALMIC | Status: DC
  Administered 2020-01-16 – 2020-01-29 (×14): 1 [drp] via OPHTHALMIC
  Filled 2020-01-15 (×2): qty 2.5

## 2020-01-15 MED ORDER — POTASSIUM CHLORIDE 10 MEQ/50ML IV SOLN: 10.0000 meq | INTRAVENOUS | Status: DC

## 2020-01-15 MED ORDER — HYDRALAZINE HCL 20 MG/ML IJ SOLN: 10.0000 mg | INTRAMUSCULAR | Status: AC | PRN

## 2020-01-15 SURGICAL SUPPLY — 20 items
BALLN IABP SENSA PLUS 8F 50CC (BALLOONS) ×2
BALLOON IABP SENS PLUS 8F 50CC (BALLOONS) IMPLANT
CATH BALLN WEDGE 5F 110CM (CATHETERS) ×1 IMPLANT
CATH INFINITI 5FR ANG PIGTAIL (CATHETERS) ×1 IMPLANT
CATH OPTITORQUE TIG 4.0 5F (CATHETERS) ×1 IMPLANT
DEVICE RAD COMP TR BAND LRG (VASCULAR PRODUCTS) ×1 IMPLANT
GLIDESHEATH SLEND SS 6F .021 (SHEATH) ×1 IMPLANT
GUIDEWIRE .025 260CM (WIRE) ×1 IMPLANT
GUIDEWIRE INQWIRE 1.5J.035X260 (WIRE) IMPLANT
INQWIRE 1.5J .035X260CM (WIRE) ×2
KIT ENCORE 26 ADVANTAGE (KITS) ×1 IMPLANT
KIT HEART LEFT (KITS) ×2 IMPLANT
KIT MICROPUNCTURE NIT STIFF (SHEATH) ×1 IMPLANT
PACK CARDIAC CATHETERIZATION (CUSTOM PROCEDURE TRAY) ×2 IMPLANT
SHEATH GLIDE SLENDER 4/5FR (SHEATH) ×1 IMPLANT
SHEATH PINNACLE 6F 10CM (SHEATH) ×1 IMPLANT
SLEEVE REPOSITIONING LENGTH 30 (MISCELLANEOUS) ×1 IMPLANT
TRANSDUCER W/STOPCOCK (MISCELLANEOUS) ×2 IMPLANT
TUBING CIL FLEX 10 FLL-RA (TUBING) ×2 IMPLANT
WIRE EMERALD 3MM-J .035X150CM (WIRE) ×1 IMPLANT

## 2020-01-15 NOTE — Progress Notes (Addendum)
ANTICOAGULATION CONSULT NOTE - Initial Consult  Pharmacy Consult for heparin Indication: chest pain/ACS  No Known Allergies  Patient Measurements: Weight: 96.2 kg (212 lb) Heparin Dosing Weight: 89 kg   Vital Signs: Temp: 97.7 F (36.5 C) (05/12 1345) Temp Source: Oral (05/12 1345) BP: 98/30 (05/12 2020) Pulse Rate: 61 (05/12 2020)  Labs: Recent Labs    02/01/2020 1402 01/07/2020 1402 01/19/2020 1548 01/07/2020 1850 01/14/2020 1850 01/13/2020 1853 01/22/2020 1854  HGB 11.8*   < >  --  11.9*   < > 11.2* 11.6*  HCT 36.2*   < >  --  35.0*  --  33.0* 34.0*  PLT 171  --   --   --   --   --   --   CREATININE 1.64*  --   --   --   --   --   --   TROPONINIHS 603*  --  2,319*  --   --   --   --    < > = values in this interval not displayed.    Estimated Creatinine Clearance: 39.1 mL/min (A) (by C-G formula based on SCr of 1.64 mg/dL (H)).   Medical History: Past Medical History:  Diagnosis Date  . Acute respiratory failure ([place])   . BPH (benign prostatic hyperplasia)   . Cardiac arrest ([place])   . Carpal tunnel syndrome   . CKD (chronic kidney disease)   . Diabetes ([place])   . DM II (diabetes mellitus, type II), controlled ([place])   . High risk medication use   . Hyperlipidemia   . Hypertension   . Low vitamin D level   . STEMI (ST elevation myocardial infarction) ([place])    01/22/18 PCI/DES to RCA  . Systolic heart failure ([employer])   . VT (ventricular tachycardia) ([facility])     Medications:  Medications Prior to Admission  Medication Sig Dispense Refill Last Dose  . amiodarone (PACERONE) 200 MG tablet Take one tablet by mouth daily Monday through Saturday.  Do NOT take on Sunday. (Patient taking differently: Take 200 mg by mouth See admin instructions. Take 200 mg by mouth in the morning on Mon/Tues/Wed/Thurs/Fri/Sat and nothing on Sunday) 90 tablet 3 01/18/2020 at am  . amLODipine (NORVASC) 10 MG tablet Take 10 mg by mouth daily.    01/04/2020 at am  . ammonium lactate (LAC-HYDRIN) 12 %  lotion Apply 1 application topically 2 (two) times daily as needed (to affected areas of legs and hands).    01/19/2020 at Unknown time  . aspirin EC 81 MG tablet Take 81 mg by mouth daily.   01/25/2020 at 0800  . atorvastatin (LIPITOR) 80 MG tablet TAKE 1 TABLET BY MOUTH ONCE DAILY AT  6  PM (Patient taking differently: Take 80 mg by mouth See admin instructions. Take 80 mg by mouth at 5 PM daily) 90 tablet 1 01/14/2020 at pm  . clopidogrel (PLAVIX) 75 MG tablet Take 1 tablet by mouth once daily (Patient taking differently: Take 75 mg by mouth daily. ) 90 tablet 3 01/29/2020 at 0800  . FERREX 150 150 MG capsule TAKE 1 CAPSULE BY MOUTH ONCE DAILY (Patient taking differently: Take 150 mg by mouth daily. ) 90 capsule 3 01/13/2020 at am  . furosemide (LASIX) 40 MG tablet Take 1 tablet (40 mg total) by mouth daily. (Patient taking differently: Take 40 mg by mouth See admin instructions. Take 40 mg by mouth in the morning and 40 mg at 1:30 PM) 90 tablet 3 01/14/2020  at am  . gabapentin (NEURONTIN) 300 MG capsule Take 300-600 mg by mouth See admin instructions. Take 300 mg by mouth in the morning and 600 mg at 5 PM  1 01/08/2020 at am  . glimepiride (AMARYL) 2 MG tablet Take 1 mg by mouth daily with breakfast.    01/23/2020 at am  . isosorbide mononitrate (IMDUR) 30 MG 24 hr tablet Take 1 tablet by mouth once daily (Patient taking differently: Take 30 mg by mouth in the morning. ) 90 tablet 0 01/08/2020 at am  . latanoprost (XALATAN) 0.005 % ophthalmic solution Place 1 drop into both eyes at bedtime.   01/14/2020 at pm  . losartan (COZAAR) 100 MG tablet Take 1 tablet (100 mg total) by mouth daily. (Patient taking differently: Take 100 mg by mouth See admin instructions. Take 100 mg by mouth at 5 PM daily) 90 tablet 3 01/14/2020 at pm  . metoprolol succinate (TOPROL-XL) 50 MG 24 hr tablet Take 1 tablet (50 mg total) by mouth daily. Take with or immediately following a meal. 90 tablet 3 01/27/2020 at 0800  . nitroGLYCERIN  (NITROSTAT) 0.4 MG SL tablet Place 1 tablet (0.4 mg total) under the tongue every 5 (five) minutes x 3 doses as needed for chest pain. 25 tablet 1 01/19/2020 at Unknown time  . potassium chloride SA (KLOR-CON M20) 20 MEQ tablet Take 1 tablet (20 mEq total) by mouth daily. 30 tablet 3 01/11/2020 at am  . terazosin (HYTRIN) 1 MG capsule Take 1 mg by mouth See admin instructions. Take 1 mg by mouth at 5 PM daily   01/14/2020 at pm  . triamcinolone cream (KENALOG) 0.1 % Apply 1 application topically as needed (to itching areas of skin).    unk at unk  . vitamin B-12 (CYANOCOBALAMIN) 500 MCG tablet Take 500 mcg by mouth daily.   01/17/2020 at am  . Vitamin D, Ergocalciferol, (DRISDOL) 50000 units CAPS capsule Take 50,000 Units by mouth every Thursday.    01/09/2020 at Unknown time    Assessment: 24 YOM with elevated troponin to start IV heparin for ACS. H/H low. Plt wnl. Heparin 4000 units IV bolus given in the ED  Patient now s/p left and right heart cath with IABP insertion. Per the cath note, restart heparin upon arrival to the CCU. Plan for staged PCI.  Goal of Therapy:  Heparin level 0.2-0.5 units/ml Monitor platelets by anticoagulation protocol: Yes   Plan:  - No bolus doses of heparin -Start heparin infusion at 1050 units/hr - heparin level 8 hours after start.  Alanda Slim, PharmD, [facility] Clinical Pharmacist Please see AMION for all Pharmacists' Contact Phone Numbers 01/12/2020, 8:51 PM

## 2020-01-15 NOTE — ED Provider Notes (Signed)
[facility] Provider Note   CSN: [id number] Arrival date & time: 01/22/2020  1339     History Chief Complaint  Patient presents with  . Shortness of Breath    Daryl Rodriguez is a 86 y.o. male.  Pt presents to the ED today with sob.  Pt said sx started yesterday.  He has some CP as well.  Per EMS, pt was 75% on RA.  He was put on 6L and sats only went up to 84%.  He was placed on 15L and sats came up to 94%.  Pt said his sob is worse with exertion.  Pt denies any f/c.  He has been fully vaccinated for Covid for several months.  No known covid exposures.  Pt's last ECHO was in July of 2020.  EF 50-55%.  Last cath 01/2018:   Prox LAD to Mid LAD lesion is 70% stenosed.  Mid Cx lesion is 75% stenosed.  Dist RCA lesion is 40% stenosed.  Mid RCA lesion is 40% stenosed.  Previously placed Prox RCA to Mid RCA stent (unknown type) is widely patent.   1.  Widely patent RCA stent with no significant restenosis.  Unchanged borderline significant calcified disease affecting the LAD and left circumflex. 2.  Moderately elevated left ventricular end-diastolic pressure.  Left ventricular angiography was not performed. 3.  Difficult procedure via the right radial artery due to significant tortuosity affecting the right subclavian and innominate arteries.  Recommendations: Continue medical therapy. Continue treatment for ventricular tachycardia. Not able to hydrate the patient given elevated left ventricular end-diastolic pressure.  Only 40 mL of contrast was used.  Avoid aggressive diuresis at least in the next 24 hours. The patient converted from atrial fibrillation to sinus rhythm during the procedure.        Past Medical History:  Diagnosis Date  . Acute respiratory failure ([place])   . BPH (benign prostatic hyperplasia)   . Cardiac arrest ([place])   . Carpal tunnel syndrome   . CKD (chronic kidney disease)   . Diabetes ([place])   . DM II (diabetes  mellitus, type II), controlled ([place])   . High risk medication use   . Hyperlipidemia   . Hypertension   . Low vitamin D level   . STEMI (ST elevation myocardial infarction) ([place])    01/22/18 PCI/DES to RCA  . Systolic heart failure ([place])   . VT (ventricular tachycardia) [facility])     Patient Active Problem List   Diagnosis Date Noted  . Lactic acidosis 01/16/2020  . Acute renal injury due to circulatory failure ([place]) 01/16/2020  . Cardiogenic shock ([place]) 01/16/2020  . NSTEMI (non-ST elevated myocardial infarction) ([place]) 01/25/2020  . Acute on chronic combined systolic and diastolic CHF (congestive heart failure) ([place])   . ICD (implantable cardioverter-defibrillator) in place 10/29/2018  . Coronary artery disease involving native coronary artery of native heart with unstable angina pectoris ([place]) 04/13/2018  . Cardiomyopathy ([place]) 04/13/2018  . V tach (HCC)-chronic history of 01/31/2018  . Hypokalemia   . Hypomagnesemia   . Acute respiratory failure with hypoxia ([place])   . Acute pulmonary edema (HCC)   . History of ST elevation myocardial infarction involving right coronary artery ([place]) 01/15/2018  . OSA (obstructive sleep apnea) 05/18/2016  . Snoring 05/18/2016  . Bilateral carotid artery stenosis 04/06/2016  . Dyslipidemia, goal LDL below 70 06/24/2015  . Essential hypertension 06/24/2015  . Peripheral vascular disease ([place]) 06/24/2015  . Type 2 diabetes mellitus with complication, without  long-term current use of insulin ([place]) 06/24/2015    Past Surgical History:  Procedure Laterality Date  . BACK SURGERY    . CORONARY STENT INTERVENTION N/A 01/22/2018   Procedure: CORONARY STENT INTERVENTION;  Surgeon: Troy Sine, MD;  Location: [facility];  Service: Cardiovascular;  Laterality: N/A;  . CORONARY/GRAFT ACUTE MI REVASCULARIZATION N/A 01/22/2018   Procedure: Coronary/Graft Acute MI Revascularization;  Surgeon: Troy Sine, MD;  Location: [facility];   Service: Cardiovascular;  Laterality: N/A;  . IABP INSERTION N/A 01/11/2020   Procedure: IABP Insertion;  Surgeon: Leonie Man, MD;  Location: [facility];  Service: Cardiovascular;  Laterality: N/A;  . ICD IMPLANT N/A 01/30/2018   Procedure: ICD IMPLANT;  Surgeon: Evans Lance, MD;  Location: [facility];  Service: Cardiovascular;  Laterality: N/A;  . LEFT HEART CATH AND CORONARY ANGIOGRAPHY N/A 01/22/2018   Procedure: LEFT HEART CATH AND CORONARY ANGIOGRAPHY;  Surgeon: Troy Sine, MD;  Location: [facility];  Service: Cardiovascular;  Laterality: N/A;  . LEFT HEART CATH AND CORONARY ANGIOGRAPHY N/A 01/25/2018   Procedure: LEFT HEART CATH AND CORONARY ANGIOGRAPHY - RELOOK;  Surgeon: Wellington Hampshire, MD;  Location: [facility];  Service: Cardiovascular;  Laterality: N/A;  . RIGHT/LEFT HEART CATH AND CORONARY ANGIOGRAPHY N/A 01/18/2020   Procedure: RIGHT/LEFT HEART CATH AND CORONARY ANGIOGRAPHY;  Surgeon: Leonie Man, MD;  Location: [facility];  Service: Cardiovascular;  Laterality: N/A;  . WRIST SURGERY         Family History  Problem Relation Age of Onset  . Heart attack Father 37  . Heart attack Mother   . Stroke Sister     Social History   Tobacco Use  . Smoking status: Former Research scientist (life sciences)  . Smokeless tobacco: Never Used  . Tobacco comment: quit smoking 50 years ago  Substance Use Topics  . Alcohol use: Not Currently  . Drug use: Not Currently    Home Medications Prior to Admission medications   Medication Sig Start Date End Date Taking? Authorizing Provider  amiodarone (PACERONE) 200 MG tablet Take one tablet by mouth daily Monday through Saturday.  Do NOT take on Sunday. Patient taking differently: Take 200 mg by mouth See admin instructions. Take 200 mg by mouth in the morning on Mon/Tues/Wed/Thurs/Fri/Sat and nothing on Sunday 03/04/19  Yes Evans Lance, MD  amLODipine (NORVASC) 10 MG tablet Take 10 mg by mouth daily.   09/12/19  Yes [provider]  ammonium lactate (LAC-HYDRIN) 12 % lotion Apply 1 application topically 2 (two) times daily as needed (to affected areas of legs and hands).  12/19/17  Yes [provider]  aspirin EC 81 MG tablet Take 81 mg by mouth daily.   Yes [provider]  atorvastatin (LIPITOR) 80 MG tablet TAKE 1 TABLET BY MOUTH ONCE DAILY AT  6  PM Patient taking differently: Take 80 mg by mouth See admin instructions. Take 80 mg by mouth at 5 PM daily 07/16/18  Yes Park Liter, MD  clopidogrel (PLAVIX) 75 MG tablet Take 1 tablet by mouth once daily Patient taking differently: Take 75 mg by mouth daily.  10/30/19  Yes Park Liter, MD  FERREX 150 150 MG capsule TAKE 1 CAPSULE BY MOUTH ONCE DAILY Patient taking differently: Take 150 mg by mouth daily.  03/21/18  Yes Reino Bellis B, NP  furosemide (LASIX) 40 MG tablet Take 1 tablet (40 mg  total) by mouth daily. Patient taking differently: Take 40 mg by mouth See admin instructions. Take 40 mg by mouth in the morning and 40 mg at 1:30 PM 05/09/18  Yes Evans Lance, MD  gabapentin (NEURONTIN) 300 MG capsule Take 300-600 mg by mouth See admin instructions. Take 300 mg by mouth in the morning and 600 mg at 5 PM 12/26/17  Yes [provider]  glimepiride (AMARYL) 2 MG tablet Take 1 mg by mouth daily with breakfast.    Yes [provider]  isosorbide mononitrate (IMDUR) 30 MG 24 hr tablet Take 1 tablet by mouth once daily Patient taking differently: Take 30 mg by mouth in the morning.  01/02/19  Yes Park Liter, MD  latanoprost (XALATAN) 0.005 % ophthalmic solution Place 1 drop into both eyes at bedtime.   Yes [provider]  losartan (COZAAR) 100 MG tablet Take 1 tablet (100 mg total) by mouth daily. Patient taking differently: Take 100 mg by mouth See admin instructions. Take 100 mg by mouth at 5 PM daily 03/04/19 06/04/20 Yes Evans Lance, MD  metoprolol succinate  (TOPROL-XL) 50 MG 24 hr tablet Take 1 tablet (50 mg total) by mouth daily. Take with or immediately following a meal. 03/04/19 06/04/20 Yes Evans Lance, MD  nitroGLYCERIN (NITROSTAT) 0.4 MG SL tablet Place 1 tablet (0.4 mg total) under the tongue every 5 (five) minutes x 3 doses as needed for chest pain. 02/01/18  Yes Reino Bellis B, NP  potassium chloride SA (KLOR-CON M20) 20 MEQ tablet Take 1 tablet (20 mEq total) by mouth daily. 02/12/18 01/12/2020 Yes Lendon Colonel, NP  terazosin (HYTRIN) 1 MG capsule Take 1 mg by mouth See admin instructions. Take 1 mg by mouth at 5 PM daily   Yes [provider]  triamcinolone cream (KENALOG) 0.1 % Apply 1 application topically as needed (to itching areas of skin).  12/05/17  Yes [provider]  vitamin B-12 (CYANOCOBALAMIN) 500 MCG tablet Take 500 mcg by mouth daily.   Yes [provider]  Vitamin D, Ergocalciferol, (DRISDOL) 50000 units CAPS capsule Take 50,000 Units by mouth every Thursday.    Yes [provider]    Allergies    Patient has no known allergies.  Review of Systems   Review of Systems  Respiratory: Positive for shortness of breath.   Cardiovascular: Positive for chest pain.  All other systems reviewed and are negative.   Physical Exam Updated Vital Signs BP (!) 157/66   Pulse 91   Temp 98.2 F (36.8 C)   Resp (!) 26   Ht 5\' 9"  (1.753 m)   Wt 94.1 kg   SpO2 97%   BMI 30.64 kg/m   Physical Exam Vitals and nursing note reviewed.  Constitutional:      General: He is in acute distress.     Appearance: He is well-developed. He is ill-appearing.  HENT:     Head: Normocephalic and atraumatic.     Mouth/Throat:     Mouth: Mucous membranes are moist.     Pharynx: Oropharynx is clear.  Eyes:     Extraocular Movements: Extraocular movements intact.     Pupils: Pupils are equal, round, and reactive to light.  Cardiovascular:     Rate and Rhythm: Normal rate and regular rhythm.   Pulmonary:     Effort: Tachypnea present.     Breath sounds: Rhonchi present.  Abdominal:     General: Bowel sounds are normal.  Palpations: Abdomen is soft.  Musculoskeletal:        General: Normal range of motion.     Cervical back: Normal range of motion and neck supple.  Skin:    General: Skin is warm.     Capillary Refill: Capillary refill takes less than 2 seconds.  Neurological:     General: No focal deficit present.     Mental Status: He is alert and oriented to person, place, and time.  Psychiatric:        Mood and Affect: Mood normal.        Behavior: Behavior normal.     ED Results / Procedures / Treatments   Labs (all labs ordered are listed, but only abnormal results are displayed) Labs Reviewed  BASIC METABOLIC PANEL - Abnormal; Notable for the following components:      Result Value   Glucose, Bld 252 (*)    Creatinine, Ser 1.64 (*)    GFR calc non Af Amer 38 (*)    GFR calc Af Amer 44 (*)    All other components within normal limits  CBC WITH DIFFERENTIAL/PLATELET - Abnormal; Notable for the following components:   RBC 3.51 (*)    Hemoglobin 11.8 (*)    HCT 36.2 (*)    MCV 103.1 (*)    Lymphs Abs 0.4 (*)    All other components within normal limits  BRAIN NATRIURETIC PEPTIDE - Abnormal; Notable for the following components:   B Natriuretic Peptide 597.8 (*)    All other components within normal limits  LACTIC ACID, PLASMA - Abnormal; Notable for the following components:   Lactic Acid, Venous 2.5 (*)    All other components within normal limits  BASIC METABOLIC PANEL - Abnormal; Notable for the following components:   Glucose, Bld 161 (*)    BUN 24 (*)    Creatinine, Ser 1.49 (*)    Calcium 8.7 (*)    GFR calc non Af Amer 43 (*)    GFR calc Af Amer 50 (*)    All other components within normal limits  MAGNESIUM - Abnormal; Notable for the following components:   Magnesium 1.1 (*)    All other components within normal limits  URINALYSIS,  ROUTINE W REFLEX MICROSCOPIC - Abnormal; Notable for the following components:   Color, Urine STRAW (*)    Hgb urine dipstick MODERATE (*)    All other components within normal limits  CBC - Abnormal; Notable for the following components:   WBC 10.7 (*)    RBC 3.50 (*)    Hemoglobin 11.6 (*)    HCT 35.1 (*)    MCV 100.3 (*)    All other components within normal limits  HEMOGLOBIN A1C - Abnormal; Notable for the following components:   Hgb A1c MFr Bld 6.5 (*)    All other components within normal limits  BASIC METABOLIC PANEL - Abnormal; Notable for the following components:   Potassium 2.4 (*)    Chloride 122 (*)    CO2 15 (*)    Glucose, Bld 123 (*)    Calcium 5.0 (*)    All other components within normal limits  COOXEMETRY PANEL - Abnormal; Notable for the following components:   Total hemoglobin 11.9 (*)    All other components within normal limits  CBC - Abnormal; Notable for the following components:   RBC 3.26 (*)    Hemoglobin 10.9 (*)    HCT 32.7 (*)    MCV 100.3 (*)  All other components within normal limits  GLUCOSE, CAPILLARY - Abnormal; Notable for the following components:   Glucose-Capillary 164 (*)    All other components within normal limits  BASIC METABOLIC PANEL - Abnormal; Notable for the following components:   CO2 21 (*)    Glucose, Bld 188 (*)    BUN 25 (*)    Creatinine, Ser 1.66 (*)    Calcium 8.8 (*)    GFR calc non Af Amer 38 (*)    GFR calc Af Amer 44 (*)    All other components within normal limits  GLUCOSE, CAPILLARY - Abnormal; Notable for the following components:   Glucose-Capillary 151 (*)    All other components within normal limits  GLUCOSE, CAPILLARY - Abnormal; Notable for the following components:   Glucose-Capillary 134 (*)    All other components within normal limits  COOXEMETRY PANEL - Abnormal; Notable for the following components:   Total hemoglobin 11.0 (*)    Carboxyhemoglobin 1.6 (*)    All other components within  normal limits  GLUCOSE, CAPILLARY - Abnormal; Notable for the following components:   Glucose-Capillary 128 (*)    All other components within normal limits  GLUCOSE, CAPILLARY - Abnormal; Notable for the following components:   Glucose-Capillary 140 (*)    All other components within normal limits  BASIC METABOLIC PANEL - Abnormal; Notable for the following components:   Glucose, Bld 130 (*)    BUN 25 (*)    Creatinine, Ser 1.50 (*)    Calcium 8.7 (*)    GFR calc non Af Amer 42 (*)    GFR calc Af Amer 49 (*)    All other components within normal limits  CBC - Abnormal; Notable for the following components:   RBC 3.22 (*)    Hemoglobin 10.7 (*)    HCT 32.4 (*)    MCV 100.6 (*)    Platelets 149 (*)    All other components within normal limits  GLUCOSE, CAPILLARY - Abnormal; Notable for the following components:   Glucose-Capillary 128 (*)    All other components within normal limits  COOXEMETRY PANEL - Abnormal; Notable for the following components:   Total hemoglobin 11.6 (*)    Carboxyhemoglobin 1.7 (*)    All other components within normal limits  GLUCOSE, CAPILLARY - Abnormal; Notable for the following components:   Glucose-Capillary 107 (*)    All other components within normal limits  GLUCOSE, CAPILLARY - Abnormal; Notable for the following components:   Glucose-Capillary 123 (*)    All other components within normal limits  HEPARIN LEVEL (UNFRACTIONATED) - Abnormal; Notable for the following components:   Heparin Unfractionated 0.24 (*)    All other components within normal limits  COOXEMETRY PANEL - Abnormal; Notable for the following components:   Carboxyhemoglobin 1.7 (*)    All other components within normal limits  GLUCOSE, CAPILLARY - Abnormal; Notable for the following components:   Glucose-Capillary 110 (*)    All other components within normal limits  GLUCOSE, CAPILLARY - Abnormal; Notable for the following components:   Glucose-Capillary 148 (*)    All  other components within normal limits  BASIC METABOLIC PANEL - Abnormal; Notable for the following components:   Potassium 3.3 (*)    Glucose, Bld 212 (*)    BUN 26 (*)    Creatinine, Ser 1.39 (*)    Calcium 8.5 (*)    GFR calc non Af Amer 47 (*)    GFR calc Af Wyvonnia Lora  54 (*)    All other components within normal limits  CBC - Abnormal; Notable for the following components:   RBC 3.20 (*)    Hemoglobin 10.7 (*)    HCT 32.7 (*)    MCV 102.2 (*)    Platelets 140 (*)    All other components within normal limits  GLUCOSE, CAPILLARY - Abnormal; Notable for the following components:   Glucose-Capillary 133 (*)    All other components within normal limits  GLUCOSE, CAPILLARY - Abnormal; Notable for the following components:   Glucose-Capillary 173 (*)    All other components within normal limits  GLUCOSE, CAPILLARY - Abnormal; Notable for the following components:   Glucose-Capillary 124 (*)    All other components within normal limits  GLUCOSE, CAPILLARY - Abnormal; Notable for the following components:   Glucose-Capillary 197 (*)    All other components within normal limits  HEPARIN LEVEL (UNFRACTIONATED) - Abnormal; Notable for the following components:   Heparin Unfractionated 0.12 (*)    All other components within normal limits  GLUCOSE, CAPILLARY - Abnormal; Notable for the following components:   Glucose-Capillary 165 (*)    All other components within normal limits  BASIC METABOLIC PANEL - Abnormal; Notable for the following components:   Glucose, Bld 122 (*)    Calcium 8.7 (*)    GFR calc non Af Amer 56 (*)    All other components within normal limits  COOXEMETRY PANEL - Abnormal; Notable for the following components:   Total hemoglobin 10.7 (*)    Carboxyhemoglobin 1.7 (*)    All other components within normal limits  CBC - Abnormal; Notable for the following components:   RBC 3.15 (*)    Hemoglobin 10.4 (*)    HCT 31.7 (*)    MCV 100.6 (*)    Platelets 133 (*)     All other components within normal limits  GLUCOSE, CAPILLARY - Abnormal; Notable for the following components:   Glucose-Capillary 211 (*)    All other components within normal limits  GLUCOSE, CAPILLARY - Abnormal; Notable for the following components:   Glucose-Capillary 158 (*)    All other components within normal limits  GLUCOSE, CAPILLARY - Abnormal; Notable for the following components:   Glucose-Capillary 126 (*)    All other components within normal limits  GLUCOSE, CAPILLARY - Abnormal; Notable for the following components:   Glucose-Capillary 114 (*)    All other components within normal limits  HEPARIN LEVEL (UNFRACTIONATED) - Abnormal; Notable for the following components:   Heparin Unfractionated 0.17 (*)    All other components within normal limits  GLUCOSE, CAPILLARY - Abnormal; Notable for the following components:   Glucose-Capillary 175 (*)    All other components within normal limits  GLUCOSE, CAPILLARY - Abnormal; Notable for the following components:   Glucose-Capillary 188 (*)    All other components within normal limits  BASIC METABOLIC PANEL - Abnormal; Notable for the following components:   Glucose, Bld 149 (*)    Creatinine, Ser 1.29 (*)    Calcium 8.6 (*)    GFR calc non Af Amer 51 (*)    GFR calc Af Amer 59 (*)    All other components within normal limits  CBC - Abnormal; Notable for the following components:   WBC 11.2 (*)    RBC 3.45 (*)    Hemoglobin 11.5 (*)    HCT 34.4 (*)    All other components within normal limits  COOXEMETRY PANEL - Abnormal; Notable  for the following components:   Total hemoglobin 11.8 (*)    All other components within normal limits  GLUCOSE, CAPILLARY - Abnormal; Notable for the following components:   Glucose-Capillary 159 (*)    All other components within normal limits  GLUCOSE, CAPILLARY - Abnormal; Notable for the following components:   Glucose-Capillary 153 (*)    All other components within normal limits   GLUCOSE, CAPILLARY - Abnormal; Notable for the following components:   Glucose-Capillary 141 (*)    All other components within normal limits  GLUCOSE, CAPILLARY - Abnormal; Notable for the following components:   Glucose-Capillary 168 (*)    All other components within normal limits  URINALYSIS, ROUTINE W REFLEX MICROSCOPIC - Abnormal; Notable for the following components:   APPearance TURBID (*)    Hgb urine dipstick LARGE (*)    Protein, ur 100 (*)    Leukocytes,Ua LARGE (*)    RBC / HPF >50 (*)    WBC, UA >50 (*)    Bacteria, UA MANY (*)    All other components within normal limits  GLUCOSE, CAPILLARY - Abnormal; Notable for the following components:   Glucose-Capillary 235 (*)    All other components within normal limits  GLUCOSE, CAPILLARY - Abnormal; Notable for the following components:   Glucose-Capillary 232 (*)    All other components within normal limits  POCT I-STAT 7, (LYTES, BLD GAS, ICA,H+H) - Abnormal; Notable for the following components:   pO2, Arterial 59 (*)    HCT 35.0 (*)    Hemoglobin 11.9 (*)    All other components within normal limits  POCT I-STAT EG7 - Abnormal; Notable for the following components:   pCO2, Ven 43.4 (*)    pO2, Ven 26.0 (*)    Calcium, Ion 1.12 (*)    HCT 33.0 (*)    Hemoglobin 11.2 (*)    All other components within normal limits  POCT I-STAT EG7 - Abnormal; Notable for the following components:   pO2, Ven 27.0 (*)    HCT 34.0 (*)    Hemoglobin 11.6 (*)    All other components within normal limits  POCT I-STAT 7, (LYTES, BLD GAS, ICA,H+H) - Abnormal; Notable for the following components:   pO2, Arterial 158 (*)    HCT 34.0 (*)    Hemoglobin 11.6 (*)    All other components within normal limits  POCT I-STAT, CHEM 8 - Abnormal; Notable for the following components:   BUN 25 (*)    Creatinine, Ser 1.50 (*)    Glucose, Bld 186 (*)    Hemoglobin 11.2 (*)    HCT 33.0 (*)    All other components within normal limits  POCT  I-STAT 7, (LYTES, BLD GAS, ICA,H+H) - Abnormal; Notable for the following components:   pO2, Arterial 49 (*)    HCT 33.0 (*)    Hemoglobin 11.2 (*)    All other components within normal limits  POCT I-STAT, CHEM 8 - Abnormal; Notable for the following components:   Potassium 3.2 (*)    BUN 26 (*)    Creatinine, Ser 1.40 (*)    Glucose, Bld 124 (*)    Hemoglobin 10.9 (*)    HCT 32.0 (*)    All other components within normal limits  POCT I-STAT 7, (LYTES, BLD GAS, ICA,H+H) - Abnormal; Notable for the following components:   pH, Arterial 7.479 (*)    pO2, Arterial 46 (*)    Acid-Base Excess 3.0 (*)    HCT  32.0 (*)    Hemoglobin 10.9 (*)    All other components within normal limits  TROPONIN I (HIGH SENSITIVITY) - Abnormal; Notable for the following components:   Troponin I (High Sensitivity) 603 (*)    All other components within normal limits  TROPONIN I (HIGH SENSITIVITY) - Abnormal; Notable for the following components:   Troponin I (High Sensitivity) 2,319 (*)    All other components within normal limits  CULTURE, BLOOD (ROUTINE X 2)  SARS CORONAVIRUS 2 BY RT PCR (HOSPITAL ORDER, [facility])  CULTURE, BLOOD (ROUTINE X 2) W REFLEX TO ID PANEL  MRSA PCR SCREENING  CULTURE, BLOOD (ROUTINE X 2)  CULTURE, BLOOD (ROUTINE X 2)  LACTIC ACID, PLASMA  TSH  PROTIME-INR  HEPARIN LEVEL (UNFRACTIONATED)  COOXEMETRY PANEL  LIPID PANEL  HEPARIN LEVEL (UNFRACTIONATED)  MAGNESIUM  COOXEMETRY PANEL  HEPARIN LEVEL (UNFRACTIONATED)  GLUCOSE, CAPILLARY  GLUCOSE, CAPILLARY  MAGNESIUM  HEPARIN LEVEL (UNFRACTIONATED)  POCT ACTIVATED CLOTTING TIME  POCT ACTIVATED CLOTTING TIME    EKG EKG Interpretation  Date/Time:  Wednesday Jan 15 2020 14:09:09 EDT Ventricular Rate:  62 PR Interval:    QRS Duration: 106 QT Interval:  532 QTC Calculation: 541 R Axis:   0 Text Interpretation: Junctional rhythm Borderline repol abnrm, anterolateral leads Prolonged QT  interval No significant change since last tracing Confirmed by Isla Pence [id number]) on 01/31/2020 3:09:48 PM   Radiology DG CHEST PORT 1 VIEW  Result Date: 01/20/2020 CLINICAL DATA:  Shortness of breath, diuresis EXAM: PORTABLE CHEST 1 VIEW COMPARISON:  01/19/2020 FINDINGS: Cardiomegaly with mild interstitial edema, most prominent in the left upper lobe, improved. Suspected trace bilateral pleural effusions. No pneumothorax. Left subclavian ICD.  Thoracic aortic atherosclerosis. IMPRESSION: Mild interstitial edema, improved. Suspected trace bilateral pleural effusions. Electronically Signed   By: Julian Hy M.D.   On: 01/20/2020 08:09   DG CHEST PORT 1 VIEW  Result Date: 01/19/2020 CLINICAL DATA:  Increasing hypoxia EXAM: PORTABLE CHEST 1 VIEW COMPARISON:  01/15/2019 FINDINGS: Cardiac shadow is enlarged. Defibrillator is again noted and stable. Aortic calcifications are seen. Patchy increasing parenchymal edema is noted particularly in the left mid lung. Right-sided PICC line is again noted and stable. Intra-aortic balloon pump has been removed in the interval. IMPRESSION: Increasing pulmonary edema. Electronically Signed   By: Inez Catalina M.D.   On: 01/19/2020 17:00    Procedures Procedures (including critical care time)  Medications Ordered in ED Medications  heparin 5000 UNIT/ML injection (  Not Given 01/20/2020 1752)  nitroGLYCERIN 0.2 mg/mL in dextrose 5 % infusion (  Not Given 01/19/2020 1800)  atorvastatin (LIPITOR) tablet 80 mg (80 mg Oral Given 01/19/20 1753)  vitamin B-12 (CYANOCOBALAMIN) tablet 500 mcg (500 mcg Oral Given 01/20/20 1026)  iron polysaccharides (NIFEREX) capsule 150 mg (150 mg Oral Given 01/20/20 1026)  gabapentin (NEURONTIN) capsule 300 mg (300 mg Oral Given 01/20/20 1025)  latanoprost (XALATAN) 0.005 % ophthalmic solution 1 drop (1 drop Both Eyes Given 01/19/20 2158)  Vitamin D (Ergocalciferol) (DRISDOL) capsule 50,000 Units (50,000 Units Oral Given 01/16/20 1001)   nitroGLYCERIN (NITROSTAT) SL tablet 0.4 mg (0.4 mg Sublingual Given 01/27/2020 1021)  acetaminophen (TYLENOL) tablet 650 mg (650 mg Oral Given 01/19/20 2034)  ondansetron (ZOFRAN) injection 4 mg (has no administration in time range)  0.9 %  sodium chloride infusion (has no administration in time range)  insulin aspart (novoLOG) injection 0-15 Units (5 Units Subcutaneous Given 01/20/20 1145)  Chlorhexidine Gluconate Cloth 2 % PADS  6 each (0 each Topical Duplicate [id number])  Chlorhexidine Gluconate Cloth 2 % PADS 6 each (6 each Topical Given 01/20/20 1000)  0.9 %  sodium chloride infusion (has no administration in time range)  hydrALAZINE (APRESOLINE) injection 10 mg (has no administration in time range)  0.9 %  sodium chloride infusion ( Intravenous Rate/Dose Change 01/16/20 0701)  sodium chloride flush (NS) 0.9 % injection 3 mL (3 mLs Intravenous Not Given 01/20/20 1000)  sodium chloride flush (NS) 0.9 % injection 3 mL (has no administration in time range)  0.9 %  sodium chloride infusion (has no administration in time range)  gabapentin (NEURONTIN) capsule 600 mg (600 mg Oral Given 01/19/20 1753)  0.9 %  sodium chloride infusion ( Intravenous Stopped 01/16/20 1752)  aspirin EC tablet 81 mg (81 mg Oral Given 01/20/20 1025)  sodium chloride flush (NS) 0.9 % injection 10-40 mL (10 mLs Intracatheter Not Given 01/20/20 1029)  sodium chloride flush (NS) 0.9 % injection 10-40 mL (has no administration in time range)  MEDLINE mouth rinse (15 mLs Mouth Rinse Given 01/20/20 1000)  amiodarone (NEXTERONE PREMIX) 360-4.14 MG/200ML-% (1.8 mg/mL) IV infusion (  Canceled Entry 01/12/2020 0918)  amiodarone (NEXTERONE) 1.8 mg/mL load via infusion 150 mg (150 mg Intravenous Bolus from Bag 01/07/2020 0919)    Followed by  amiodarone (NEXTERONE PREMIX) 360-4.14 MG/200ML-% (1.8 mg/mL) IV infusion (60 mg/hr Intravenous Rate/Dose Change 01/24/2020 0921)    Followed by  amiodarone (NEXTERONE PREMIX) 360-4.14 MG/200ML-% (1.8 mg/mL)  IV infusion (30 mg/hr Intravenous New Bag/Given 01/20/20 1410)  docusate sodium (COLACE) capsule 100 mg (100 mg Oral Given 01/20/20 1025)  melatonin tablet 3 mg (3 mg Oral Given 01/19/20 2158)  amLODipine (NORVASC) tablet 5 mg (5 mg Oral Given 01/20/20 1025)  clopidogrel (PLAVIX) tablet 75 mg (75 mg Oral Given 01/20/20 1025)  potassium chloride (KLOR-CON) CR tablet 40 mEq (40 mEq Oral Given 01/20/20 1025)  furosemide (LASIX) tablet 40 mg (40 mg Oral Given 01/20/20 1025)  spironolactone (ALDACTONE) tablet 12.5 mg (12.5 mg Oral Given 01/20/20 1025)  heparin ADULT infusion 100 units/mL (25000 units/21mL sodium chloride 0.45%) (1,200 Units/hr Intravenous Rate/Dose Verify 01/20/20 1400)  milrinone (PRIMACOR) 20 MG/100 ML (0.2 mg/mL) infusion (0.125 mcg/kg/min  94.9 kg Intravenous New Bag/Given 01/20/20 1401)  potassium chloride SA (KLOR-CON) CR tablet 40 mEq (has no administration in time range)  cefTRIAXone (ROCEPHIN) 1 g in sodium chloride 0.9 % 100 mL IVPB (1 g Intravenous New Bag/Given 01/20/20 1400)  vancomycin (VANCOREADY) IVPB 1250 mg/250 mL (1,250 mg Intravenous New Bag/Given 01/20/20 1446)  furosemide (LASIX) injection 40 mg (40 mg Intravenous Given 01/26/2020 1555)  heparin injection 4,000 Units (4,000 Units Intravenous Given 02/02/2020 1750)  milrinone (PRIMACOR) 20 MG/100 ML (0.2 mg/mL) infusion (0.25 mcg/kg/min  96.2 kg Intravenous New Bag/Given 01/19/2020 1939)  0.9 %  sodium chloride infusion (10 mL/hr Intravenous New Bag/Given 01/27/2020 2020)  furosemide (LASIX) injection 80 mg (80 mg Intravenous Given 02/02/2020 2354)  potassium chloride 10 mEq in 50 mL *CENTRAL LINE* IVPB ( Intravenous Stopped 01/16/20 0202)  potassium chloride SA (KLOR-CON) CR tablet 20 mEq (20 mEq Oral Given 01/10/2020 2354)  furosemide (LASIX) injection 80 mg (80 mg Intravenous Given 01/16/20 0455)  potassium chloride SA (KLOR-CON) CR tablet 40 mEq (40 mEq Oral Given 01/16/20 0553)  potassium chloride 10 mEq in 100 mL IVPB (0 mEq  Intravenous Stopped 01/11/2020 1030)  potassium chloride SA (KLOR-CON) CR tablet 40 mEq (40 mEq Oral Given 01/16/20 1448)  potassium chloride SA (KLOR-CON)  CR tablet 40 mEq (40 mEq Oral Given 01/16/20 2201)  potassium chloride SA (KLOR-CON) CR tablet 40 mEq (40 mEq Oral Given 01/14/2020 1022)  potassium chloride SA (KLOR-CON) CR tablet 40 mEq (40 mEq Oral Given 01/18/20 1401)  furosemide (LASIX) injection 40 mg (40 mg Intravenous Given 01/19/20 1503)  furosemide (LASIX) 10 MG/ML injection (  Return to [facility] 01/19/20 1506)  furosemide (LASIX) injection 40 mg (40 mg Intravenous Given 01/19/20 1633)  furosemide (LASIX) 10 MG/ML injection (  Return to [facility] 01/19/20 1633)  metolazone (ZAROXOLYN) tablet 5 mg (5 mg Oral Given 01/19/20 1753)  furosemide (LASIX) injection 80 mg (80 mg Intravenous Given 01/20/20 1038)    ED Course  I have reviewed the triage vital signs and the nursing notes.  Pertinent labs & imaging results that were available during my care of the patient were reviewed by me and considered in my medical decision making (see chart for details).    MDM Rules/Calculators/A&P                      Pt was 86% on RA here.  He was put on 4L and O2 sats are in the 90s.  Pt's troponin elevated.  Pt d/w cards who will see pt.  Pt did receive asa by EMS.   Pt d/w cardiology who will see pt.  AICD interrogated.  Covid neg.  CRITICAL CARE Performed by: Isla Pence   Total critical care time: 30 minutes  Critical care time was exclusive of separately billable procedures and treating other patients.  Critical care was necessary to treat or prevent imminent or life-threatening deterioration.  Critical care was time spent personally by me on the following activities: development of treatment plan with patient and/or surrogate as well as nursing, discussions with consultants, evaluation of patient's response to treatment, examination of patient, obtaining history from patient or surrogate,  ordering and performing treatments and interventions, ordering and review of laboratory studies, ordering and review of radiographic studies, pulse oximetry and re-evaluation of patient's condition.   Final Clinical Impression(s) / ED Diagnoses Final diagnoses:  NSTEMI (non-ST elevated myocardial infarction) ([place])  Acute respiratory failure with hypoxia (HCC)  Acute on chronic congestive heart failure, unspecified heart failure type [facility])    Rx / DC Orders ED Discharge Orders    None       Isla Pence, MD 01/20/20 1514

## 2020-01-15 NOTE — ED Notes (Signed)
Pt granddaughter brought pt records in which show his weight log and pt has gained 5.9lbs since may 1st.

## 2020-01-15 NOTE — H&P (Addendum)
Cardiology H&P:   Patient ID: Daryl Rodriguez MRN: [id number]; DOB: 09-Dec-1933  Admit date: 02/03/2020 Date of Consult: 01/27/2020  Primary Care Provider: Renaldo Reel, PA Primary Cardiologist: [name], MD  Primary Electrophysiologist:  None    Patient Profile:   Daryl Rodriguez is a 86 y.o. male with a history of CAD with STEMI in 01/2018 treated with DES to RCA, chronic systolic CHF/ischemic cardiomyopathy with EF 50-55% in 03/2019, ventricular tachycardia s/p ICD, hypertension, hyperlipidemia, type 2 diabetes mellitus, and BPH who is being seen today for the evaluation of who is being seen for chest pain and CHF at the request of Dr. Gilford Raid (ED).  History of Present Illness:   Daryl Rodriguez is a 86 year old male with the above history who is followed by Dr. Agustin Cree.  Patient presented on 01/22/2018 with inferior STEMI.  Cardiac catheterization at that time showed total occlusion of the mid RCA with thrombus formation which was treated with PCI/DES.  He was also noted to have 60 to 70% stenosis of the mid LAD and 75% stenosis of the LCX. He was started on DAPT with Aspirin and Brilinta. Echo showed LVEF of 40 to 45% with diffuse hypokinesis and grade 1 diastolic dysfunction.  He had a complicated admission. The day following cath, he developed atrial fibrillation with RVR and then non-sustained VT/SVT. He was started on IV Amiodarone and IV Heparin with plans.  He then developed sustained VT with a rate of 230 bpm requiring cardioversion.  Had recurrent episode of sustained VT requiring a second cardioversion.  Lidocaine was added.  Given recurrent VT, he was taken back to the cath lab for relook cath on 01/25/2018 which showed no acute changes.  Initial plans were to place a LifeVest.  Had recurrence of atrial fibrillation and atrial flutter therefore was her maintained on amiodarone drip.  However his hemoglobin dropped 2 g developed and decision was made to defer anticoagulation.  On  01/29/2018, patient collapsed and was found to be in unstable VT.  He was defibrillated with return x1 with return of normal sinus rhythm.  Decision was made at that time to place an ICD.  He underwent ICD placement on 5/28.  He also had some AKI and urinary retention during admission both nephrology and urology were consulted for assistance.  Since this admission, he really has done well from a cardiac standpoint.  Repeat echo and 03/2019 showed improved EF of 50 to 55% with grade 2 diastolic dysfunction.  He was last seen by Dr. Agustin Cree and 10/2019 and was doing well at that time.  Patient presents to the ED today via EMS for further evaluation of chest pain and shortness of breath. Patient was in his usual state of health until yesterday when he noticed being short of breath working in his shop behind his house. He was sweeping the floor when he got short of breath and had to stop and take a break. He does not usually have shortness of breath with activities like this. No chest discomfort with this. No orthopnea or PND overnight but patient states he did not sleep well. This morning he noticed feeling very fatigued and states he had to sit down while showering and shaving but he felt so tired. He states this is very abnormal for him. He then developed shortness of breath at rest with associated non-radiating chest tightness. No associated diaphoresis, nausea, or vomiting. Given significant shortness of breath, he called 911. O2 sats 75% room air upon EMS  arrival.  Placed on nasal cannula and then nonrebreather mask at 15 L and O2 sats improved to 94 to 96%. He keeps a great log of his weight, BP, and heart rate. Weights have been trending up since the beginning of the month and he has gained about 6lbs since 01/04/2020. BP run from 120's/50's to 150's/70's. Heart rates normally in the 40's to 50's; however, in the 60's today. He notes some dizziness if he stands too quickly but denies any palpitations, syncope, or  falls.   Upon arrival to the ED, with O2 sats in the mid 80s on room air but sustaining above 90% on 4 L via nasal cannula. Otherwise, vitals stable.  Possible junctional rhythm with a lot of underlying artifact but no ST elevation.  Initial high-sensitivity troponin elevated at 603.  BNP elevated at 597. Chest x-ray showed mild central pulmonary vascular congestion and mild bibasilar subsegmental atelectasis or edema. WBC 8.3, Hgb 11.8, Plts 171. Na 138, K 4.5, Glucose 252, BUN 23, Cr 1.64. Lactic acid 2.5. COVID-19 negative.   At the time of this evaluation, patient still having some labored breathing but better. He also notes continued chest tightness.    Past Medical History:  Diagnosis Date  . Acute respiratory failure ([place])   . BPH (benign prostatic hyperplasia)   . Cardiac arrest ([place])   . Carpal tunnel syndrome   . CKD (chronic kidney disease)   . Diabetes ([place])   . DM II (diabetes mellitus, type II), controlled ([place])   . High risk medication use   . Hyperlipidemia   . Hypertension   . Low vitamin D level   . STEMI (ST elevation myocardial infarction) ([place])    01/22/18 PCI/DES to RCA  . Systolic heart failure ([place])   . VT (ventricular tachycardia) ([place])     Past Surgical History:  Procedure Laterality Date  . BACK SURGERY    . CORONARY STENT INTERVENTION N/A 01/22/2018   Procedure: CORONARY STENT INTERVENTION;  Surgeon: Troy Sine, MD;  Location: [facility];  Service: Cardiovascular;  Laterality: N/A;  . CORONARY/GRAFT ACUTE MI REVASCULARIZATION N/A 01/22/2018   Procedure: Coronary/Graft Acute MI Revascularization;  Surgeon: Troy Sine, MD;  Location: [place] [facility];  Service: Cardiovascular;  Laterality: N/A;  . ICD IMPLANT N/A 01/30/2018   Procedure: ICD IMPLANT;  Surgeon: Evans Lance, MD;  Location: [facility];  Service: Cardiovascular;  Laterality: N/A;  . LEFT HEART CATH AND CORONARY ANGIOGRAPHY N/A 01/22/2018   Procedure: LEFT HEART CATH  AND CORONARY ANGIOGRAPHY;  Surgeon: Troy Sine, MD;  Location: [facility];  Service: Cardiovascular;  Laterality: N/A;  . LEFT HEART CATH AND CORONARY ANGIOGRAPHY N/A 01/25/2018   Procedure: LEFT HEART CATH AND CORONARY ANGIOGRAPHY - RELOOK;  Surgeon: Wellington Hampshire, MD;  Location: [facility];  Service: Cardiovascular;  Laterality: N/A;  . WRIST SURGERY       Home Medications:  Prior to Admission medications   Medication Sig Start Date End Date Taking? Authorizing Provider  glimepiride (AMARYL) 2 MG tablet Take 1 mg by mouth daily with breakfast.    Yes [provider]  amiodarone (PACERONE) 200 MG tablet Take one tablet by mouth daily Monday through Saturday.  Do NOT take on Sunday. 03/04/19   Evans Lance, MD  amLODipine (NORVASC) 10 MG tablet Take 10 mg by mouth daily.  09/12/19   [provider]  ammonium lactate (LAC-HYDRIN) 12 % lotion  Apply 1 application topically 2 (two) times daily. Arms legs and hands 12/19/17   [provider]  aspirin EC 81 MG tablet Take 81 mg by mouth daily.    [provider]  atorvastatin (LIPITOR) 80 MG tablet TAKE 1 TABLET BY MOUTH ONCE DAILY AT  6  PM 07/16/18   Park Liter, MD  clopidogrel (PLAVIX) 75 MG tablet Take 1 tablet by mouth once daily 10/30/19   Park Liter, MD  Cyanocobalamin (VITAMIN B 12 PO) Take 500 mcg by mouth daily.    [provider]  FERREX 150 150 MG capsule TAKE 1 CAPSULE BY MOUTH ONCE DAILY 03/21/18   Reino Bellis B, NP  furosemide (LASIX) 40 MG tablet Take 1 tablet (40 mg total) by mouth daily. Patient taking differently: Take 40 mg by mouth 2 (two) times daily.  05/09/18   Evans Lance, MD  gabapentin (NEURONTIN) 300 MG capsule Take 1 capsule by mouth 2 (two) times daily. 12/26/17   [provider]  isosorbide mononitrate (IMDUR) 30 MG 24 hr tablet Take 1 tablet by mouth once daily 01/02/19   Park Liter, MD  latanoprost (XALATAN)  0.005 % ophthalmic solution Place 1 drop into both eyes at bedtime.    [provider]  losartan (COZAAR) 100 MG tablet Take 1 tablet (100 mg total) by mouth daily. 03/04/19 06/04/20  Evans Lance, MD  metoprolol succinate (TOPROL-XL) 50 MG 24 hr tablet Take 1 tablet (50 mg total) by mouth daily. Take with or immediately following a meal. 03/04/19 06/04/20  Evans Lance, MD  nitroGLYCERIN (NITROSTAT) 0.4 MG SL tablet Place 1 tablet (0.4 mg total) under the tongue every 5 (five) minutes x 3 doses as needed for chest pain. 02/01/18   Cheryln Manly, NP  potassium chloride SA (KLOR-CON M20) 20 MEQ tablet Take 1 tablet (20 mEq total) by mouth daily. 02/12/18 01/12/2020  Lendon Colonel, NP  terazosin (HYTRIN) 1 MG capsule Take 1 mg by mouth daily.    [provider]  triamcinolone cream (KENALOG) 0.1 % Apply 1 application topically as needed (Itchy skin).  12/05/17   [provider]  Vitamin D, Ergocalciferol, (DRISDOL) 50000 units CAPS capsule Take 50,000 Units by mouth once a week. Thursdays    [provider]    Inpatient Medications: Scheduled Meds:  Continuous Infusions:  PRN Meds:   Allergies:   No Known Allergies  Social History:   Social History   Socioeconomic History  . Marital status: Married    Spouse name: Not on file  . Number of children: Not on file  . Years of education: Not on file  . Highest education level: Not on file  Occupational History  . Not on file  Tobacco Use  . Smoking status: Former Research scientist (life sciences)  . Smokeless tobacco: Never Used  . Tobacco comment: quit smoking 50 years ago  Substance and Sexual Activity  . Alcohol use: Not Currently  . Drug use: Not Currently  . Sexual activity: Not on file  Other Topics Concern  . Not on file  Social History Narrative  . Not on file   Social Determinants of Health   Financial Resource Strain:   . Difficulty of Paying Living Expenses:   Food Insecurity:   . Worried About  Charity fundraiser in the Last Year:   . Arboriculturist in the Last Year:   Transportation Needs:   . Film/video editor (Medical):   Marland Kitchen  Lack of Transportation (Non-Medical):   Physical Activity:   . Days of Exercise per Week:   . Minutes of Exercise per Session:   Stress:   . Feeling of Stress :   Social Connections:   . Frequency of Communication with Friends and Family:   . Frequency of Social Gatherings with Friends and Family:   . Attends Religious Services:   . Active Member of Clubs or Organizations:   . Attends Archivist Meetings:   Marland Kitchen Marital Status:   Intimate Partner Violence:   . Fear of Current or Ex-Partner:   . Emotionally Abused:   Marland Kitchen Physically Abused:   . Sexually Abused:     Family History:    Family History  Problem Relation Age of Onset  . Heart attack Father 11  . Heart attack Mother   . Stroke Sister      ROS:  Please see the history of present illness.  All other ROS reviewed and negative.     Physical Exam/Data:   Vitals:   01/04/2020 1521 01/23/2020 1530 01/18/2020 1600 01/07/2020 1630  BP: (!) 117/57  (!) 119/57   Pulse: (!) 56 (!) 57 (!) 57 (!) 57  Resp: (!) 21 13 (!) 24 19  Temp:      TempSrc:      SpO2: 91% 91% 96% 93%  Weight:       No intake or output data in the 24 hours ending 01/31/2020 1734 Last 3 Weights 01/07/2020 10/25/2019 06/05/2019  Weight (lbs) 212 lb 214 lb 6.4 oz 216 lb  Weight (kg) 96.163 kg 97.251 kg 97.977 kg     Body mass index is 31.31 kg/m.  General: 86 y.o. male resting comfortably in no acute distress. HEENT: Normocephalic and atraumatic. Sclera clear.  Neck: Supple. No carotid bruits. No JVD. Heart: Mildly bradycardic with regular rhythm. Distinct S1 and S2. No murmurs, gallops, or rubs. Radial and distal pedal pulses 2+ and equal bilaterally. Lungs: No increased work of breathing. Clear to ausculation bilaterally. No wheezes, rhonchi, or rales.  Abdomen: Soft, non-distended, and non-tender to  palpation. Bowel sounds present. MSK: Normal strength and tone for age. Extremities: Trace to 1+ pitting edema edema.    Skin: Warm and dry. Neuro: Alert and oriented x3. No focal deficits. Psych: Normal affect. Responds appropriately.   EKG:  The EKG was personally reviewed and demonstrates:  Normal sinus rhythm, 62 bpm, with underlying artifact but no acute ST/T changes. 1st degree AV block.   Telemetry:  Telemetry was personally reviewed and demonstrates: Sinus bradycardia with rates in the 50's.  Relevant CV Studies:  Left Heart Catheterization 01/22/2018:  Mid Cx lesion is 75% stenosed.  Prox LAD to Mid LAD lesion is 65% stenosed.  Dist RCA lesion is 20% stenosed.  Mid RCA lesion is 30% stenosed.  Prox RCA to Mid RCA lesion is 100% stenosed.  Post intervention, there is a 0% residual stenosis.  A stent was successfully placed.   Acute ST segment elevation myocardial infarction secondary to total occlusion of a calcified mid right coronary artery with thrombus formation with initial TIMI 0 flow.  Coronary calcification with multivessel CAD and additional 60-70% calcified stenosis of the mid LAD and 75% stenoses of the left circumflex vessel.  Difficult but successful percutaneous coronary intervention to the totally occluded calcified mid RCA with PTCA, and ultimate insertion of a 3.024 mm Synergy DES stent postdilated to 3.25 mm with the 100% occlusion being reduced to 0% and restoration of  brisk TIMI-3 flow.  Recommendations: Dual antiplatelet therapy for minimum of 1 year and probably indefinitely.  Continue Aggrastat for 18 hours post procedure.  Initial medical therapy for concomitant CAD.  Due to continued hypoxemia, the patient may benefit from BiPAP therapy in the CCU with continued IV diuresis.  A 2-D echo Doppler study will be scheduled to assess LV function and valvular architecture.  High potency statin therapy, and guideline directed optimal post MI  treatment. _______________  Echocardiogram 01/23/2018: Study Conclusions: - Left ventricle: The cavity size was mildly dilated. Systolic  function was mildly to moderately reduced. The estimated ejection  fraction was in the range of 40% to 45%. Diffuse hypokinesis.  Doppler parameters are consistent with abnormal left ventricular  relaxation (grade 1 diastolic dysfunction).  - Mitral valve: There was mild regurgitation.  - Left atrium: The atrium was mildly dilated.  - Right ventricle: The cavity size was moderately dilated. Wall  thickness was normal.  - Tricuspid valve: There was mild regurgitation.  - Pulmonary arteries: Systolic pressure was mildly increased. PA  peak pressure: 35 mm Hg (S).  _______________  Left Heart Catheterization 01/26/2020:  Prox LAD to Mid LAD lesion is 70% stenosed.  Mid Cx lesion is 75% stenosed.  Dist RCA lesion is 40% stenosed.  Mid RCA lesion is 40% stenosed.  Previously placed Prox RCA to Mid RCA stent (unknown type) is widely patent.   1.  Widely patent RCA stent with no significant restenosis.  Unchanged borderline significant calcified disease affecting the LAD and left circumflex. 2.  Moderately elevated left ventricular end-diastolic pressure.  Left ventricular angiography was not performed. 3.  Difficult procedure via the right radial artery due to significant tortuosity affecting the right subclavian and innominate arteries.  Recommendations: Continue medical therapy. Continue treatment for ventricular tachycardia. Not able to hydrate the patient given elevated left ventricular end-diastolic pressure.  Only 40 mL of contrast was used.  Avoid aggressive diuresis at least in the next 24 hours. The patient converted from atrial fibrillation to sinus rhythm during the procedure. _______________  ICD Implant 01/30/2018: Conclusions:  1. Ischemic cardiomyopathy with chronic New York Heart Association class II heart failure and  sustained hemodynamically unstable VT.   2. Successful ICD implantation.   3. DFT less than or equal to 20 joules.   4. No inducible VT or VF with PES  5. No early apparent complications.   ICD Criteria Current LVEF:30%. Within 12 months prior to implant: Yes  Heart failure history: Yes, Class II Cardiomyopathy history: Yes, Ischemic Cardiomyopathy - Prior MI. Atrial Fibrillation/Atrial Flutter: Yes, Paroxysmal. Ventricular tachycardia history: Yes, Hemodynamic instability present. VT Type: Sustained Ventricular Tachycardia - Monomorphic. Cardiac arrest history: Yes, Ventricular Tachycardia. History of syndromes with risk of sudden death: No. Previous ICD: No. Current ICD indication: Secondary PPM indication: No.               Class I or II Bradycardia indication present: No Beta Blocker therapy for 3 or more months: Yes, prescribed.  Ace Inhibitor/ARB therapy for 3 or more months: Yes, prescribed. _______________  Echocardiogram 03/22/2019: Impressions: 1. The left ventricle has low normal systolic function, with an ejection  fraction of 50-55%. The cavity size was normal. Left ventricular diastolic  Doppler parameters are consistent with pseudonormalization.  2. The right ventricle has normal systolic function. The cavity was  normal. There is no increase in right ventricular wall thickness.  3. The mitral valve is grossly normal.  4. The tricuspid valve is  grossly normal.  5. The aortic valve is grossly normal. Aortic valve regurgitation was not  assessed by color flow Doppler.  6. The aortic root is normal in size and structure.  7. The inferior vena cava was normal in size with <50% respiratory  variability.  Laboratory Data:  High Sensitivity Troponin:   Recent Labs  Lab 02/01/2020 1402 01/05/2020 1548  TROPONINIHS 603* 2,319*     Chemistry Recent Labs  Lab 01/24/2020 1402  NA 138  K 4.5  CL 104  CO2 24  GLUCOSE 252*  BUN 23  CREATININE 1.64*  CALCIUM  8.9  GFRNONAA 38*  GFRAA 44*  ANIONGAP 10    No results for input(s): PROT, ALBUMIN, AST, ALT, ALKPHOS, BILITOT in the last 168 hours. Hematology Recent Labs  Lab 01/24/2020 1402  WBC 8.3  RBC 3.51*  HGB 11.8*  HCT 36.2*  MCV 103.1*  MCH 33.6  MCHC 32.6  RDW 14.7  PLT 171   BNP Recent Labs  Lab 01/24/2020 1403  BNP 597.8*    DDimer No results for input(s): DDIMER in the last 168 hours.   Radiology/Studies:  DG Chest Port 1 View  Result Date: 02/03/2020 CLINICAL DATA:  Shortness of breath. EXAM: PORTABLE CHEST 1 VIEW COMPARISON:  March 26, 2018. FINDINGS: Stable cardiomediastinal silhouette. Mild central pulmonary vascular congestion is noted. Left-sided pacemaker is unchanged in position. Atherosclerosis of thoracic aorta is noted. No pneumothorax or pleural effusion is noted. Mild bibasilar subsegmental atelectasis or edema is noted. Bony thorax is unremarkable. IMPRESSION: Aortic atherosclerosis. Stable mild central pulmonary vascular congestion. Mild bibasilar subsegmental atelectasis or edema is noted. Aortic Atherosclerosis (ICD10-I70.0). Electronically Signed   By: Marijo Conception M.D.   On: 01/14/2020 14:48   TIMI Risk Score for Unstable Angina or Non-ST Elevation MI:   The patient's TIMI risk score is 6, which indicates a 41% risk of all cause mortality, new or recurrent myocardial infarction or need for urgent revascularization in the next 14 days.   Assessment and Plan:   NSTEMI with Known CAD  - Patient presented with dyspnea and chest tightness someone similar to symptoms before prior MI in 01/2018.  - EKG shows no acute ischemic changes. - Initial high-sensitivity troponin elevated at 603. Will continue to trend.  - Will check Echo. - Will start IV Heparin and IV Nitro.  - Continue home Aspirin 81mg , Plavix 75mg  daily, Toprol-XL 50mg  daily, and Lipitor 80mg  daily. Will hold home Imdur while on Nitro drip.  - Will plan for left heart catheterization tomorrow if  creatinine stable. The patient understands that risks include but are not limited to stroke (1 in 1000), death (1 in 57), kidney failure [usually temporary] (1 in 500), bleeding (1 in 200), allergic reaction [possibly serious] (1 in 200), and agrees to proceed.   Acute on Chronic Combined CHF with Hypoxic Respiratory Failure - BNP elevated in the 500's.  - Chest x-ray showed mild central pulmonary vascular congestion and mild bibasilar atelectasis or edema.  - Echo in 03/2019 showed LVEF of 50-55% with grade 2 diastolic dysfunction.  - Will repeat Echo. - Patient received dose of IV Lasix 40mg  in the ED. Will give another dose of IV Lasix 40mg  tonight and recheck volume status and creatinine in the morning.  - Will hold home Losartan given renal function to allow for more diuresis. - Continue Toprol-XL 50mg  daily.  - Continue to monitor daily weight, strict I/O's, and renal function.  History of VT s/p ICD -  History of sustained VT post-STEMI in 01/2018. Had ICD placed. No ICD shocks. ICD was interrogated in the ED but have not seen report.  - Continue home Amiodarone 200mg  daily (does not take on Sundays) and Toprol.   Atrial Fibrillation/Flutter - Following STEMI in 01/2018. No documented recurrence. Not on anticoagulation.  - Currently in sinus bradycardia. Baseline rates in the 40's to 50's. - Continue Toprol-XL.  Elevated Lactic Acid - Lactic acid 2.5. Repeat 1.7.  - Blood cultures were ordered by ED provider. Results pending. - No signs of infection. Suspect due to initial hypoxia as O2 sats were in the 70's when EMS arrived.   Hypertension - BP well controlled.  - Continue home Toprol-XL 50mg  daily.  - Will hold Losartan given renal function.  - Will also hold home Amlodipine given need for Nitro drip.   Hyperlipidemia - Continue Lipitor 80mg  daily.  - Will check lipid panel.  Type 2 Diabetes Mellitus - Will check hemoglobin A1c. - Will hold home Glimepiride and place on  sliding scale insulin. - Continue Gabapentin for neuropathy.   AKI - Creatinine 1.67 on presentation. Baseline 1.1 to 1.2.  - May be due to renal congestion.  - Will hold Losartan.  - Continue to monitor.   Code Status: Patient has advanced directive that states he does not want any prolonged life saving interventions. Discussed with him and he would like to have full interventions, including CPR and intubation, if we believe  it will be short-term and he will have quick recovery. Therefore, will place him as Full Code.   Severity of Illness: The appropriate patient status for this patient is INPATIENT. Inpatient status is judged to be reasonable and necessary in order to provide the required intensity of service to ensure the patient's safety. The patient's presenting symptoms, physical exam findings, and initial radiographic and laboratory data in the context of their chronic comorbidities is felt to place them at high risk for further clinical deterioration. Furthermore, it is not anticipated that the patient will be medically stable for discharge from the hospital within 2 midnights of admission. The following factors support the patient status of inpatient.   " The patient's presenting symptoms include chest pain and shortness of breath. " The worrisome physical exam findings as above. " The initial radiographic and laboratory data are worrisome because of elevated troponin and BNP. " The chronic co-morbidities include CAD, HTN, HLD, T2DM.   * I certify that at the point of admission it is my clinical judgment that the patient will require inpatient hospital care spanning beyond 2 midnights from the point of admission due to high intensity of service, high risk for further deterioration and high frequency of surveillance required.*     For questions or updates, please contact [facility] Please consult [email or web address] for contact info under     Signed, Darreld Mclean, PA-C   01/09/2020 5:34 PM

## 2020-01-15 NOTE — ED Notes (Signed)
MD informed of elevated troponin

## 2020-01-15 NOTE — Procedures (Signed)
Arterial Catheter Insertion Procedure Note Daryl Rodriguez [id number] 1934/01/25  Procedure: Insertion of Arterial Catheter  Indications: Blood pressure monitoring and Frequent blood sampling  Procedure Details Consent: Risks of procedure as well as the alternatives and risks of each were explained to the (patient/caregiver).  Consent for procedure obtained. Time Out: Verified patient identification, verified procedure, site/side was marked, verified correct patient position, special equipment/implants available, medications/allergies/relevent history reviewed, required imaging and test results available.  Performed  Maximum sterile technique was used including antiseptics, cap, gloves, gown, hand hygiene, mask and sheet. Skin prep: Chlorhexidine; local anesthetic administered 20 gauge catheter was inserted into left radial artery using the Seldinger technique. ULTRASOUND GUIDANCE USED: NO Evaluation Blood flow good; BP tracing good. Complications: No apparent complications.   Daryl Rodriguez 01/18/2020

## 2020-01-15 NOTE — ED Notes (Signed)
Successful transmission of defibrillator info to [facility] per representative

## 2020-01-15 NOTE — ED Triage Notes (Signed)
Pt is here from home by [facility] due to sob.  Pt was 75% on RA on ems arrival and was placed on 6l [place] and improved to 84% on 6L, he was then placed on NRB 15L and came up to 94-96%.  Pt has been having CP and sob since yesterday.  Symptoms are worse with exertion.  Pt has 20g in RAC. Pt is alert and oriented. CBG 265.

## 2020-01-15 NOTE — ED Notes (Addendum)
Defribrilator interrogated. Will call [facility] to follow up and see if information was transmitted successfully

## 2020-01-15 NOTE — ED Notes (Signed)
Daughter Asencion Partridge [phone] would like an update

## 2020-01-15 NOTE — Progress Notes (Signed)
ANTICOAGULATION CONSULT NOTE - Initial Consult  Pharmacy Consult for heparin Indication: chest pain/ACS  No Known Allergies  Patient Measurements: Weight: 96.2 kg (212 lb) Heparin Dosing Weight: 89 kg   Vital Signs: Temp: 97.7 F (36.5 C) (05/12 1345) Temp Source: Oral (05/12 1345) BP: 119/57 (05/12 1600) Pulse Rate: 57 (05/12 1630)  Labs: Recent Labs    01/16/2020 1402 02/02/2020 1548  HGB 11.8*  --   HCT 36.2*  --   PLT 171  --   CREATININE 1.64*  --   TROPONINIHS 603* 2,319*    Estimated Creatinine Clearance: 39.1 mL/min (A) (by C-G formula based on SCr of 1.64 mg/dL (H)).   Medical History: Past Medical History:  Diagnosis Date  . Acute respiratory failure ([place])   . BPH (benign prostatic hyperplasia)   . Cardiac arrest ([place])   . Carpal tunnel syndrome   . CKD (chronic kidney disease)   . Diabetes ([place])   . DM II (diabetes mellitus, type II), controlled ([place])   . High risk medication use   . Hyperlipidemia   . Hypertension   . Low vitamin D level   . STEMI (ST elevation myocardial infarction) ([place])    01/22/18 PCI/DES to RCA  . Systolic heart failure ([place])   . VT (ventricular tachycardia) ([facility])     Medications:  (Not in a hospital admission)   Assessment: 22 YOM with elevated troponin to start IV heparin for ACS. H/H low. Plt wnl. Heparin 4000 units IV bolus given in the ED  Goal of Therapy:  Heparin level 0.3-0.7 units/ml Monitor platelets by anticoagulation protocol: Yes   Plan:  -Start heparin infusion at 1150 units/hr -F/u [facility] plans after cath today   Albertina Parr, PharmD., BCPS, BCCCP Clinical Pharmacist Clinical phone for 01/05/2020 until 11:30pm: [phone] If after 11:30pm, please refer to [facility] for unit-specific pharmacist

## 2020-01-15 NOTE — Brief Op Note (Signed)
01/25/2020  8:20 PM  PATIENT:  Daryl Rodriguez  86 y.o. male with a history of inferior STEMI with RCA PCI in May 0370 (complicated by recurrent VT leading to ICD placement).  Has moderately reduced EF by echo at that time with some improvement.  Was in his usual state of health doing well until the evening of 01/14/2020 when he started having profound dyspnea while cleaning his backyard shop.  He had to stop several times to catch his breath.  Denied any chest pain at that time, however he awoke this morning on 01/16/2020 with pressure heavy sensation that he said was a pain in his chest of 8 out of 10 associate with profound dyspnea.  Normal routine activities did not seem to help it.  He finally contacted EMS with significant dyspnea.  Upon arrival EMS he was in acute respiratory distress, lactate was over 2.  He was on BiPAP upon arrival to the ER was weaned down to nasal cannula oxygen.  Initial troponin VI 100 went to over 2000.  He was given 40 mg IV Lasix.  Cardiology was then consulted at roughly 4:30 PM and he was seen at roughly 5 by myself and Sande Rives, [place].  He was continued note tightness across his chest was quite dyspneic.  Oxygen saturations in the high 80s on high flow oxygen.  On my clinical judgment it was that he was in likely for pulmonary edema and with ongoing chest tightness and dyspnea/hypoxia I felt it prudent to proceed with cardiac catheterization with urgent non-STEMI and ACUTE ON CHRONIC COMBINED SYSTOLIC AND DIASTOLIC HEART FAILURE resulting in ACUTE RESPIRATORY FAILURE WITH HYPOXIA.  PRE-OPERATIVE DIAGNOSIS:  NSTEMI, ACUTE ON CHRONIC MILD SYSTOLIC AND DIASTOLIC HEART FAILURE, acute respiratory failure with hypoxia  POST-OPERATIVE DIAGNOSIS:    Severe multivessel disease:   Ostial Left Main 30 to 40%,   severe diffuse proximal to mid LAD segmental lesions of 60% with an intervening shelflike lesion in segment of 95% (calcified).  Shortly downstream of this lesion there  was another 85% stenosis; LCx as proximal 30 and 60% lesions just after the AV groove circumflex.  Then in the mid section there is a long segment of roughly 80% stenosis followed by a long lateral OM branch/distal LCx;   patent RCA stent with diffuse mild disease-bifurcates into RPDA with ostial and proximal 60% long lesion as well as a little larger posterior AV groove branch that gives off 1 small OM1 large posterolateral branch with 80% mid lesion.  SEVERE PULMONARY VENOUS HYPERTENSION consistent with ACUTE COMBINED SYSTOLIC AND DIASTOLIC HEART FAILURE:   LV P-EDP 126/18 mmHg - 33 mmHg; AOP 125/58 mmHg-MAP 78 mmHg.  RAP mean 13-15 mmHg; RVP-EDP 65/3 mmHg - 13 mmHg  PAP-mean 73/23 mmHg - 42 mmHg; PCWP 46/62 (large V wave)-mean 39 mmHg  PROCEDURE:  Procedure(s): LEFT HEART CATH AND CORONARY ANGIOGRAPHY (N/A)  --> Right radial access (ultrasound-guided), 6 French sheath -TIG 4.0 catheter for LV hemodynamics, right and left coronary cineangiography, exchanged over long change wire for angled pigtail catheter that was used to take abdominal aortic angiogram prior to IABP insertion. ->  Catheter then removed completely out of the body.  RIGHT HEART CATHETERIZATION -> right brachial IV placed by Cath Lab RN using ultrasound guidance, prepped and draped in sterile sterile fashion -> exchanged using Seldinger technique for 5 French sheath; 5 French Swan-Ganz catheter advanced using 0.25 Glidewire into the RA position, then advanced with balloon up into the RV, PA and PCWP position. ->  O2 samples obtained to determine cardiac output and index by Johnna Acosta insertion to 92 cm --> Based on severe pulmonary venous hypertension and significant reduced cardiac output, we decided to place IABP.  IABP INSERTION --> RFA access using micropuncture kit-fluoroscopic and ultrasound guidance. ->  5 French micropuncture exchanged to 6 [place] sheath and then upsized to the 8 [place] IABP sheath -> IABP advanced  over 0.25 wire into the aortic knob confirmed during fluoroscopy.  Lines flushed and placement confirmed fluoroscopically.-->  Augmentation set at 1: 1 with excellent augmentation and capture.   SURGEON:  Surgeon(s) and Role:    * Leonie Man, MD - Primary  ASSISTANTS: Curbside consultation with Dr. Burt Knack assisting with film review and Dr. Haroldine Laws with CHF management  ANESTHESIA:   Subcutaneous lidocaine for all 3 access sites  EBL:  <20 mL  SHEATHS: Right brachial sheath sutured in place and Tegaderm was sterile dressing covering the extra Swan-Ganz catheter length.  Plan is to not manipulate the catheter.;  IABP sheath sutured in place and stay sites attached.;  Right radial sheath removed with TR band placed for hemostasis.  MEDICATIONS USED: 70 mL contrast; 80 mg IV Lasix; milrinone drip infused at 0.25; IV nitroglycerin infusion initially paused and then restarted at 5-10 mcg.  DICTATION: .Note written in EPIC  PLAN OF CARE: Admit to inpatient -> Plan is to stabilize overnight with balloon pump and BiPAP placed in the Cath Lab -> once stabilized with wedge pressure reduced, can then consider staged PCI of likely the LAD followed by Circumflex.  Need to ensure renal function is stable. Will restart IV heparin infusion upon arrival to the CCU.  PATIENT DISPOSITION:  ICU-on BiPAP with IABP.  Now hemodynamically stable, not requiring pressors, on milrinone and IV nitroglycerin.   Delay start of Pharmacological VTE agent (>24hrs) due to surgical blood loss or risk of bleeding: no   Glenetta Hew, M.D., M.S. Interventional Cardiologist   Pager # [phone] Phone # [phone] [address]. [place], [place] [address]

## 2020-01-16 ENCOUNTER — Inpatient Hospital Stay: Payer: Self-pay

## 2020-01-16 ENCOUNTER — Inpatient Hospital Stay (HOSPITAL_COMMUNITY): Payer: Medicare HMO

## 2020-01-16 DIAGNOSIS — N179 Acute kidney failure, unspecified: Secondary | ICD-10-CM | POA: Diagnosis present

## 2020-01-16 DIAGNOSIS — J810 Acute pulmonary edema: Secondary | ICD-10-CM

## 2020-01-16 DIAGNOSIS — E785 Hyperlipidemia, unspecified: Secondary | ICD-10-CM

## 2020-01-16 DIAGNOSIS — E118 Type 2 diabetes mellitus with unspecified complications: Secondary | ICD-10-CM

## 2020-01-16 DIAGNOSIS — R579 Shock, unspecified: Secondary | ICD-10-CM

## 2020-01-16 DIAGNOSIS — R570 Cardiogenic shock: Secondary | ICD-10-CM | POA: Diagnosis present

## 2020-01-16 DIAGNOSIS — I472 Ventricular tachycardia: Secondary | ICD-10-CM

## 2020-01-16 DIAGNOSIS — R079 Chest pain, unspecified: Secondary | ICD-10-CM

## 2020-01-16 LAB — GLUCOSE, CAPILLARY
Glucose-Capillary: 107 mg/dL — ABNORMAL HIGH (ref 70–99)
Glucose-Capillary: 128 mg/dL — ABNORMAL HIGH (ref 70–99)
Glucose-Capillary: 128 mg/dL — ABNORMAL HIGH (ref 70–99)
Glucose-Capillary: 134 mg/dL — ABNORMAL HIGH (ref 70–99)
Glucose-Capillary: 140 mg/dL — ABNORMAL HIGH (ref 70–99)
Glucose-Capillary: 151 mg/dL — ABNORMAL HIGH (ref 70–99)

## 2020-01-16 LAB — POCT I-STAT 7, (LYTES, BLD GAS, ICA,H+H)
Acid-base deficit: 1 mmol/L (ref 0.0–2.0)
Bicarbonate: 24.2 mmol/L (ref 20.0–28.0)
Calcium, Ion: 1.19 mmol/L (ref 1.15–1.40)
HCT: 33 % — ABNORMAL LOW (ref 39.0–52.0)
Hemoglobin: 11.2 g/dL — ABNORMAL LOW (ref 13.0–17.0)
O2 Saturation: 84 %
Potassium: 3.7 mmol/L (ref 3.5–5.1)
Sodium: 139 mmol/L (ref 135–145)
TCO2: 25 mmol/L (ref 22–32)
pCO2 arterial: 40.5 mmHg (ref 32.0–48.0)
pH, Arterial: 7.385 (ref 7.350–7.450)
pO2, Arterial: 49 mmHg — ABNORMAL LOW (ref 83.0–108.0)

## 2020-01-16 LAB — CBC
HCT: 32.7 % — ABNORMAL LOW (ref 39.0–52.0)
Hemoglobin: 10.9 g/dL — ABNORMAL LOW (ref 13.0–17.0)
MCH: 33.4 pg (ref 26.0–34.0)
MCHC: 33.3 g/dL (ref 30.0–36.0)
MCV: 100.3 fL — ABNORMAL HIGH (ref 80.0–100.0)
Platelets: 172 10*3/uL (ref 150–400)
RBC: 3.26 MIL/uL — ABNORMAL LOW (ref 4.22–5.81)
RDW: 14.8 % (ref 11.5–15.5)
WBC: 9.5 10*3/uL (ref 4.0–10.5)
nRBC: 0 % (ref 0.0–0.2)

## 2020-01-16 LAB — URINALYSIS, ROUTINE W REFLEX MICROSCOPIC
Bacteria, UA: NONE SEEN
Bilirubin Urine: NEGATIVE
Glucose, UA: NEGATIVE mg/dL
Ketones, ur: NEGATIVE mg/dL
Leukocytes,Ua: NEGATIVE
Nitrite: NEGATIVE
Protein, ur: NEGATIVE mg/dL
Specific Gravity, Urine: 1.013 (ref 1.005–1.030)
pH: 5 (ref 5.0–8.0)

## 2020-01-16 LAB — COOXEMETRY PANEL
Carboxyhemoglobin: 1.5 % (ref 0.5–1.5)
Carboxyhemoglobin: 1.6 % — ABNORMAL HIGH (ref 0.5–1.5)
Methemoglobin: 1.1 % (ref 0.0–1.5)
Methemoglobin: 1.3 % (ref 0.0–1.5)
O2 Saturation: 82.5 %
O2 Saturation: 99.6 %
Total hemoglobin: 12.8 g/dL (ref 12.0–16.0)
Total hemoglobin: 11 g/dL — ABNORMAL LOW (ref 12.0–16.0)

## 2020-01-16 LAB — LIPID PANEL
Cholesterol: 132 mg/dL (ref 0–200)
HDL: 57 mg/dL (ref >40)
LDL Cholesterol: 63 mg/dL (ref 0–99)
Total CHOL/HDL Ratio: 2.3 RATIO
Triglycerides: 60 mg/dL (ref <150)
VLDL: 12 mg/dL (ref 0–40)

## 2020-01-16 LAB — HEPARIN LEVEL (UNFRACTIONATED)
Heparin Unfractionated: 0.47 IU/mL (ref 0.30–0.70)
Heparin Unfractionated: 0.32 IU/mL (ref 0.30–0.70)

## 2020-01-16 LAB — BASIC METABOLIC PANEL
Anion gap: 11 (ref 5–15)
Anion gap: 11 (ref 5–15)
BUN: 24 mg/dL — ABNORMAL HIGH (ref 8–23)
BUN: 25 mg/dL — ABNORMAL HIGH (ref 8–23)
CO2: 23 mmol/L (ref 22–32)
CO2: 21 mmol/L — ABNORMAL LOW (ref 22–32)
Calcium: 8.7 mg/dL — ABNORMAL LOW (ref 8.9–10.3)
Calcium: 8.8 mg/dL — ABNORMAL LOW (ref 8.9–10.3)
Chloride: 106 mmol/L (ref 98–111)
Chloride: 106 mmol/L (ref 98–111)
Creatinine, Ser: 1.49 mg/dL — ABNORMAL HIGH (ref 0.61–1.24)
Creatinine, Ser: 1.66 mg/dL — ABNORMAL HIGH (ref 0.61–1.24)
GFR calc Af Amer: 50 mL/min — ABNORMAL LOW (ref >60)
GFR calc Af Amer: 44 mL/min — ABNORMAL LOW (ref >60)
GFR calc non Af Amer: 43 mL/min — ABNORMAL LOW (ref >60)
GFR calc non Af Amer: 38 mL/min — ABNORMAL LOW (ref >60)
Glucose, Bld: 161 mg/dL — ABNORMAL HIGH (ref 70–99)
Glucose, Bld: 188 mg/dL — ABNORMAL HIGH (ref 70–99)
Potassium: 3.6 mmol/L (ref 3.5–5.1)
Potassium: 3.5 mmol/L (ref 3.5–5.1)
Sodium: 140 mmol/L (ref 135–145)
Sodium: 138 mmol/L (ref 135–145)

## 2020-01-16 LAB — MRSA PCR SCREENING: MRSA by PCR: NEGATIVE

## 2020-01-16 LAB — POCT I-STAT, CHEM 8
BUN: 26 mg/dL — ABNORMAL HIGH (ref 8–23)
Calcium, Ion: 1.21 mmol/L (ref 1.15–1.40)
Chloride: 104 mmol/L (ref 98–111)
Creatinine, Ser: 1.4 mg/dL — ABNORMAL HIGH (ref 0.61–1.24)
Glucose, Bld: 124 mg/dL — ABNORMAL HIGH (ref 70–99)
HCT: 32 % — ABNORMAL LOW (ref 39.0–52.0)
Hemoglobin: 10.9 g/dL — ABNORMAL LOW (ref 13.0–17.0)
Potassium: 3.2 mmol/L — ABNORMAL LOW (ref 3.5–5.1)
Sodium: 137 mmol/L (ref 135–145)
TCO2: 29 mmol/L (ref 22–32)

## 2020-01-16 LAB — ECHOCARDIOGRAM COMPLETE
Height: 69 in
Weight: 3312.19 oz

## 2020-01-16 LAB — POCT ACTIVATED CLOTTING TIME: Activated Clotting Time: 219 seconds

## 2020-01-16 MED ORDER — SODIUM CHLORIDE 0.9% FLUSH
10.0000 mL | Freq: Two times a day (BID) | INTRAVENOUS | Status: DC
  Administered 2020-01-17 – 2020-01-24 (×10): 10 mL
  Administered 2020-01-25: 20 mL
  Administered 2020-01-26 (×2): 10 mL
  Administered 2020-01-27: 20 mL
  Administered 2020-01-27 – 2020-01-29 (×5): 10 mL

## 2020-01-16 MED ORDER — POTASSIUM CHLORIDE CRYS ER 20 MEQ PO TBCR
40.0000 meq | EXTENDED_RELEASE_TABLET | Freq: Once | ORAL | Status: AC
  Administered 2020-01-16: 40 meq via ORAL
  Filled 2020-01-16: qty 2

## 2020-01-16 MED ORDER — POTASSIUM CHLORIDE CRYS ER 20 MEQ PO TBCR
40.0000 meq | EXTENDED_RELEASE_TABLET | Freq: Once | ORAL | Status: AC
  Administered 2020-01-16: 40 meq via ORAL
  Filled 2020-01-16: qty 4
  Filled 2020-01-16: qty 2

## 2020-01-16 MED ORDER — ORAL CARE MOUTH RINSE
15.0000 mL | Freq: Two times a day (BID) | OROMUCOSAL | Status: DC
  Administered 2020-01-16 – 2020-01-25 (×18): 15 mL via OROMUCOSAL

## 2020-01-16 MED ORDER — ORAL CARE MOUTH RINSE
15.0000 mL | Freq: Two times a day (BID) | OROMUCOSAL | Status: DC
  Administered 2020-01-16 (×2): 15 mL via OROMUCOSAL

## 2020-01-16 MED ORDER — POTASSIUM CITRATE ER 10 MEQ (1080 MG) PO TBCR: 40.0000 meq | EXTENDED_RELEASE_TABLET | Freq: Once | ORAL | Status: DC

## 2020-01-16 MED ORDER — SODIUM CHLORIDE 0.9% FLUSH
10.0000 mL | INTRAVENOUS | Status: DC | PRN
  Administered 2020-01-26: 10 mL

## 2020-01-16 MED ORDER — MILRINONE LACTATE IN DEXTROSE 20-5 MG/100ML-% IV SOLN: 0.1250 ug/kg/min | INTRAVENOUS | Status: DC

## 2020-01-16 MED ORDER — FUROSEMIDE 10 MG/ML IJ SOLN
80.0000 mg | Freq: Once | INTRAMUSCULAR | Status: AC
  Administered 2020-01-16: 80 mg via INTRAVENOUS
  Filled 2020-01-16: qty 8

## 2020-01-16 MED ORDER — ASPIRIN EC 81 MG PO TBEC
81.0000 mg | DELAYED_RELEASE_TABLET | Freq: Every day | ORAL | Status: DC
  Administered 2020-01-16 – 2020-01-29 (×14): 81 mg via ORAL
  Filled 2020-01-16 (×14): qty 1

## 2020-01-16 MED ORDER — CLOPIDOGREL BISULFATE 75 MG PO TABS
75.0000 mg | ORAL_TABLET | Freq: Every day | ORAL | Status: DC
  Administered 2020-01-16: 75 mg via ORAL
  Filled 2020-01-16: qty 1

## 2020-01-16 MED ORDER — CHLORHEXIDINE GLUCONATE 0.12 % MT SOLN
15.0000 mL | Freq: Two times a day (BID) | OROMUCOSAL | Status: DC
  Administered 2020-01-16: 15 mL via OROMUCOSAL

## 2020-01-16 MED ORDER — POTASSIUM CHLORIDE 10 MEQ/100ML IV SOLN
10.0000 meq | INTRAVENOUS | Status: AC
  Administered 2020-01-16 (×2): 10 meq via INTRAVENOUS
  Filled 2020-01-16 (×2): qty 100

## 2020-01-16 MED ORDER — FUROSEMIDE 10 MG/ML IJ SOLN
15.0000 mg/h | INTRAVENOUS | Status: DC
  Administered 2020-01-16: 15 mg/h via INTRAVENOUS
  Administered 2020-01-16: 10 mg/h via INTRAVENOUS
  Filled 2020-01-16 (×3): qty 25
  Filled 2020-01-16: qty 21

## 2020-01-16 NOTE — Consult Note (Addendum)
Advanced Heart Failure Team Consult Note   Primary Physician: Renaldo Reel, PA PCP-Cardiologist:  [name], MD  Reason for Consultation: Cardiogenic Shock   HPI:    Koleman Mckinnies is seen today for evaluation of cardiogenic shock  at the request of Dr [name].   Mr Baisa is a 86 year old with history of  CAD with STEMI in 01/2018 treated with DES to RCA, chronic systolic CHF/ischemic cardiomyopathy with EF 50-55% in 03/2019, ventricular tachycardia s/p ICD, hypertension, hyperlipidemia, type 2 diabetes mellitus, and BPH.   Admitted with chest pain and shortness of breath. In the ED oxygen sats were in the 80s . Placed on Bipap. HS Trop 600>2000, lastic acid 2.5, BNP 597, hgb 11.8, and SARS 2 negative. Cardiology consulted. Taken to the cath lab with severe multivessel disease, elevated filling pressures, and low output heart failure. IABP placed 1:1,  Milrinone 0.25 mcg and IV lasix started.  During the night he was switched to lasix drip at 15 mg per hour. Improved urine output noted. CO-OX 82% today.  Remains on BIPAP. Failed wean this morning.  Denies chest pain.   Swan Numbers R Brachial-PA [id number]   LHC/RHC 01/27/2020  ? Ostial Left Main 30 to 40%,   severe diffuse proximal to mid LAD segmental lesions of 60% with an intervening shelflike lesion in segment of 95% (calcified). Shortly downstream of this lesion there was another 80% stenosis; LCx as proximal 30 and 60% lesions just after the AV groove circumflex. Then in the mid section there is a long segment of roughly 80% stenosis followed by a long lateral OM branch/distal LCx; ? patent RCA stent with diffuse mild disease-bifurcates into RPDA with ostial and proximal 60% long lesion as well as a little larger posterior AV groove branch that gives off 1 small OM1 large posterolateral branch with 80% mid lesion. ? RA 13 ? PA 73/23 (28)  ? PCWP 33 ? Fick CO 3.99 ? Fick CI 1.88 ? IABP placed   ECHO 02/01/2020 EF 40-45%   Echo 2020 EF 50-55%   Review of Systems: [y] = yes, [ ]  = no   . General: Weight gain [ ] ; Weight loss [ ] ; Anorexia [ ] ; Fatigue [Y]; Fever [ ] ; Chills [ ] ; Weakness [ Y]  . Cardiac: Chest pain/pressure [ ] ; Resting SOB [ ] ; Exertional SOB [Y ]; Orthopnea [ ] ; Pedal Edema [ ] ; Palpitations [ ] ; Syncope [ ] ; Presyncope [ ] ; Paroxysmal nocturnal dyspnea[ ]   . Pulmonary: Cough [ ] ; Wheezing[ ] ; Hemoptysis[ ] ; Sputum [ ] ; Snoring [ ]   . GI: Vomiting[ ] ; Dysphagia[ ] ; Melena[ ] ; Hematochezia [ ] ; Heartburn[ ] ; Abdominal pain [ ] ; Constipation [ ] ; Diarrhea [ ] ; BRBPR [ ]   . GU: Hematuria[ ] ; Dysuria [ ] ; Nocturia[ ]   . Vascular: Pain in legs with walking [ ] ; Pain in feet with lying flat [ ] ; Non-healing sores [ ] ; Stroke [ ] ; TIA [ ] ; Slurred speech [ ] ;  . Neuro: Headaches[ ] ; Vertigo[ ] ; Seizures[ ] ; Paresthesias[ ] ;Blurred vision [ ] ; Diplopia [ ] ; Vision changes [ ]   . Ortho/Skin: Arthritis [ ] ; Joint pain [Y ]; Muscle pain [ ] ; Joint swelling [ ] ; Back Pain [ ] ; Rash [ ]   . Psych: Depression[ ] ; Anxiety[ ]   . Heme: Bleeding problems [ ] ; Clotting disorders [ ] ; Anemia [ ]   . Endocrine: Diabetes [Y ]; Thyroid dysfunction[ ]   Home Medications Prior to Admission medications   Medication Sig  Start Date End Date Taking? Authorizing Provider  amiodarone (PACERONE) 200 MG tablet Take one tablet by mouth daily Monday through Saturday.  Do NOT take on Sunday. Patient taking differently: Take 200 mg by mouth See admin instructions. Take 200 mg by mouth in the morning on Mon/Tues/Wed/Thurs/Fri/Sat and nothing on Sunday 03/04/19  Yes Evans Lance, MD  amLODipine (NORVASC) 10 MG tablet Take 10 mg by mouth daily.  09/12/19  Yes [provider]  ammonium lactate (LAC-HYDRIN) 12 % lotion Apply 1 application topically 2 (two) times daily as needed (to affected areas of legs and hands).  12/19/17  Yes [provider]  aspirin EC 81 MG tablet Take 81 mg by mouth daily.   Yes [provider]  atorvastatin (LIPITOR) 80 MG tablet TAKE 1 TABLET BY MOUTH ONCE DAILY AT  6  PM Patient taking differently: Take 80 mg by mouth See admin instructions. Take 80 mg by mouth at 5 PM daily 07/16/18  Yes Park Liter, MD  clopidogrel (PLAVIX) 75 MG tablet Take 1 tablet by mouth once daily Patient taking differently: Take 75 mg by mouth daily.  10/30/19  Yes Park Liter, MD  FERREX 150 150 MG capsule TAKE 1 CAPSULE BY MOUTH ONCE DAILY Patient taking differently: Take 150 mg by mouth daily.  03/21/18  Yes Cheryln Manly, NP  furosemide (LASIX) 40 MG tablet Take 1 tablet (40 mg total) by mouth daily. Patient taking differently: Take 40 mg by mouth See admin instructions. Take 40 mg by mouth in the morning and 40 mg at 1:30 PM 05/09/18  Yes Evans Lance, MD  gabapentin (NEURONTIN) 300 MG capsule Take 300-600 mg by mouth See admin instructions. Take 300 mg by mouth in the morning and 600 mg at 5 PM 12/26/17  Yes [provider]  glimepiride (AMARYL) 2 MG tablet Take 1 mg by mouth daily with breakfast.    Yes [provider]  isosorbide mononitrate (IMDUR) 30 MG 24 hr tablet Take 1 tablet by mouth once daily Patient taking differently: Take 30 mg by mouth in the morning.  01/02/19  Yes Park Liter, MD  latanoprost (XALATAN) 0.005 % ophthalmic solution Place 1 drop into both eyes at bedtime.   Yes [provider]  losartan (COZAAR) 100 MG tablet Take 1 tablet (100 mg total) by mouth daily. Patient taking differently: Take 100 mg by mouth See admin instructions. Take 100 mg by mouth at 5 PM daily 03/04/19 06/04/20 Yes Evans Lance, MD  metoprolol succinate (TOPROL-XL) 50 MG 24 hr tablet Take 1 tablet (50 mg total) by mouth daily. Take with or immediately following a meal. 03/04/19 06/04/20 Yes Evans Lance, MD  nitroGLYCERIN (NITROSTAT) 0.4 MG SL tablet Place 1 tablet (0.4 mg total) under the tongue every 5 (five) minutes x 3 doses as needed  for chest pain. 02/01/18  Yes Reino Bellis B, NP  potassium chloride SA (KLOR-CON M20) 20 MEQ tablet Take 1 tablet (20 mEq total) by mouth daily. 02/12/18 01/27/2020 Yes Lendon Colonel, NP  terazosin (HYTRIN) 1 MG capsule Take 1 mg by mouth See admin instructions. Take 1 mg by mouth at 5 PM daily   Yes [provider]  triamcinolone cream (KENALOG) 0.1 % Apply 1 application topically as needed (to itching areas of skin).  12/05/17  Yes [provider]  vitamin B-12 (CYANOCOBALAMIN) 500 MCG tablet Take 500 mcg by mouth daily.   Yes [provider]  Vitamin D, Ergocalciferol, (DRISDOL) 50000 units CAPS capsule Take 50,000 Units by mouth every Thursday.    Yes [provider]    Past Medical History: Past Medical History:  Diagnosis Date  . Acute respiratory failure ([place])   . BPH (benign prostatic hyperplasia)   . Cardiac arrest ([place])   . Carpal tunnel syndrome   . CKD (chronic kidney disease)   . Diabetes ([place])   . DM II (diabetes mellitus, type II), controlled ([place])   . High risk medication use   . Hyperlipidemia   . Hypertension   . Low vitamin D level   . STEMI (ST elevation myocardial infarction) ([place])    01/22/18 PCI/DES to RCA  . Systolic heart failure ([place])   . VT (ventricular tachycardia) ([place])     Past Surgical History: Past Surgical History:  Procedure Laterality Date  . BACK SURGERY    . CORONARY STENT INTERVENTION N/A 01/22/2018   Procedure: CORONARY STENT INTERVENTION;  Surgeon: Troy Sine, MD;  Location: [facility];  Service: Cardiovascular;  Laterality: N/A;  . CORONARY/GRAFT ACUTE MI REVASCULARIZATION N/A 01/22/2018   Procedure: Coronary/Graft Acute MI Revascularization;  Surgeon: Troy Sine, MD;  Location: [facility];  Service: Cardiovascular;  Laterality: N/A;  . IABP INSERTION N/A 01/20/2020   Procedure: IABP Insertion;  Surgeon: Leonie Man, MD;  Location: [facility];  Service:  Cardiovascular;  Laterality: N/A;  . ICD IMPLANT N/A 01/30/2018   Procedure: ICD IMPLANT;  Surgeon: Evans Lance, MD;  Location: [facility];  Service: Cardiovascular;  Laterality: N/A;  . LEFT HEART CATH AND CORONARY ANGIOGRAPHY N/A 01/22/2018   Procedure: LEFT HEART CATH AND CORONARY ANGIOGRAPHY;  Surgeon: Troy Sine, MD;  Location: [facility];  Service: Cardiovascular;  Laterality: N/A;  . LEFT HEART CATH AND CORONARY ANGIOGRAPHY N/A 01/25/2018   Procedure: LEFT HEART CATH AND CORONARY ANGIOGRAPHY - RELOOK;  Surgeon: Wellington Hampshire, MD;  Location: [facility];  Service: Cardiovascular;  Laterality: N/A;  . RIGHT/LEFT HEART CATH AND CORONARY ANGIOGRAPHY N/A 01/07/2020   Procedure: RIGHT/LEFT HEART CATH AND CORONARY ANGIOGRAPHY;  Surgeon: Leonie Man, MD;  Location: [facility];  Service: Cardiovascular;  Laterality: N/A;  . WRIST SURGERY      Family History: Family History  Problem Relation Age of Onset  . Heart attack Father 25  . Heart attack Mother   . Stroke Sister     Social History: Social History   Socioeconomic History  . Marital status: Married    Spouse name: Not on file  . Number of children: Not on file  . Years of education: Not on file  . Highest education level: Not on file  Occupational History  . Not on file  Tobacco Use  . Smoking status: Former Research scientist (life sciences)  . Smokeless tobacco: Never Used  . Tobacco comment: quit smoking 50 years ago  Substance and Sexual Activity  . Alcohol use: Not Currently  . Drug use: Not Currently  . Sexual activity: Not on file  Other Topics Concern  . Not on file  Social History Narrative  . Not on file   Social Determinants of Health   Financial Resource Strain:   . Difficulty of Paying Living Expenses:   Food Insecurity:   . Worried About Charity fundraiser in the Last Year:   . Arboriculturist in the Last Year:   Transportation Needs:   .  Lack of Transportation (Medical):   Marland Kitchen  Lack of Transportation (Non-Medical):   Physical Activity:   . Days of Exercise per Week:   . Minutes of Exercise per Session:   Stress:   . Feeling of Stress :   Social Connections:   . Frequency of Communication with Friends and Family:   . Frequency of Social Gatherings with Friends and Family:   . Attends Religious Services:   . Active Member of Clubs or Organizations:   . Attends Archivist Meetings:   Marland Kitchen Marital Status:     Allergies:  No Known Allergies  Objective:    Vital Signs:   Temp:  [97.7 F (36.5 C)-99.6 F (37.6 C)] 98.7 F (37.1 C) (05/13 0800) Pulse Rate:  [56-220] 66 (05/13 1001) Resp:  [12-35] 15 (05/13 1000) BP: (87-141)/(24-90) 121/43 (05/13 1001) SpO2:  [82 %-100 %] 97 % (05/13 1000) Arterial Line BP: (114-157)/(36-63) 131/47 (05/13 1000) FiO2 (%):  [100 %] 100 % (05/13 0200) Weight:  [93.9 kg-97.8 kg] 93.9 kg (05/13 0600)    Weight change: Filed Weights   01/13/2020 1351 01/09/2020 2100 01/16/20 0600  Weight: 96.2 kg 97.8 kg 93.9 kg    Intake/Output:   Intake/Output Summary (Last 24 hours) at 01/16/2020 1041 Last data filed at 01/16/2020 1000 Gross per 24 hour  Intake 1373.95 ml  Output 2050 ml  Net -676.05 ml      Physical Exam    General:  On Bipap  HEENT: normal Neck: supple. JVP difficult to assess . Carotids 2+ bilat; no bruits. No lymphadenopathy or thyromegaly appreciated. Cor: PMI nondisplaced. Regular rate & rhythm. No rubs, gallops or murmurs. Lungs: clear Abdomen: soft, nontender, nondistended. No hepatosplenomegaly. No bruits or masses. Good bowel sounds. Extremities: no cyanosis, clubbing, rash, edema. RUE brachial swan. R femoral IABP. R and LLE 1-2+ edema.  Neuro: alert & orientedx3, cranial nerves grossly intact. moves all 4 extremities w/o difficulty. Affect pleasant   Telemetry   NSR 60s   EKG   SR 62 bpm.  Labs   Basic Metabolic Panel: Recent Labs  Lab 01/25/2020 1402 01/07/2020 1850 01/19/2020 2230  01/17/2020 2252 01/19/2020 2342 01/08/2020 2351 01/16/20 0429  NA 138   < > 144 138 139 138 140  K 4.5   < > 2.4* 3.6 3.5 3.5 3.6  CL 104  --  122*  --  103 106 106  CO2 24  --  15*  --   --  21* 23  GLUCOSE 252*  --  123*  --  186* 188* 161*  BUN 23  --  18  --  25* 25* 24*  CREATININE 1.64*  --  0.83  --  1.50* 1.66* 1.49*  CALCIUM 8.9  --  5.0*  --   --  8.8* 8.7*  MG  --   --  1.1*  --   --   --   --    < > = values in this interval not displayed.    Liver Function Tests: No results for input(s): AST, ALT, ALKPHOS, BILITOT, PROT, ALBUMIN in the last 168 hours. No results for input(s): LIPASE, AMYLASE in the last 168 hours. No results for input(s): AMMONIA in the last 168 hours.  CBC: Recent Labs  Lab 01/17/2020 1402 01/16/2020 1850 01/10/2020 1854 01/07/2020 2230 01/18/2020 2252 01/14/2020 2342 01/16/20 0429  WBC 8.3  --   --  10.7*  --   --  9.5  NEUTROABS 7.1  --   --   --   --   --   --  HGB 11.8*   < > 11.6* 11.6* 11.6* 11.2* 10.9*  HCT 36.2*   < > 34.0* 35.1* 34.0* 33.0* 32.7*  MCV 103.1*  --   --  100.3*  --   --  100.3*  PLT 171  --   --  175  --   --  172   < > = values in this interval not displayed.    Cardiac Enzymes: No results for input(s): CKTOTAL, CKMB, CKMBINDEX, TROPONINI in the last 168 hours.  BNP: BNP (last 3 results) Recent Labs    01/04/2020 1403  BNP 597.8*    ProBNP (last 3 results) No results for input(s): PROBNP in the last 8760 hours.   CBG: Recent Labs  Lab 01/23/2020 2100 01/16/20 0420 01/16/20 0755  GLUCAP 164* 151* 134*    Coagulation Studies: Recent Labs    01/31/2020 2230  LABPROT 14.7  INR 1.2     Imaging   CARDIAC CATHETERIZATION  Addendum Date: 01/16/2020    Extensive mid-distal LAD disease: Mid LAD-1 lesion is 60% stenosed. Mid LAD-2 lesion is 95% stenosed -eccentric shelflike calcified lesion. Mid LAD-3 lesion is 45% stenosed with 70% stenosed side branch in 3rd Diag. Dist LAD lesion is 80% stenosed.  Ost Cx to Prox Cx  lesion is 30% stenosed.  Prox Cx lesion is 60% stenosed. Prox Cx to Mid Cx lesion is 80% stenosed.  Prox RCA to Mid RCA previously placed stent is 5% stenosed.  Mid RCA lesion is 40% stenosed.  RPDA lesion is 60% stenosed.  2nd RPL lesion is 80% stenosed.  LV end diastolic pressure is severely elevated.  There is no aortic valve stenosis.  Hemodynamic findings consistent with moderate pulmonary hypertension.  Mid LAD-2 lesion is 95% stenosed.  Prox Cx to Mid Cx lesion is 80% stenosed.  SUMMARY  Severe multivessel disease: ? Ostial Left Main 30 to 40%,  severe diffuse proximal to mid LAD segmental lesions of 60% with an intervening shelflike lesion in segment of 95% (calcified).  Shortly downstream of this lesion there was another 80% stenosis; LCx as proximal 30 and 60% lesions just after the AV groove circumflex.  Then in the mid section there is a long segment of roughly 80% stenosis followed by a long lateral OM branch/distal LCx; ? patent RCA stent with diffuse mild disease-bifurcates into RPDA with ostial and proximal 60% long lesion as well as a little larger posterior AV groove branch that gives off 1 small OM1 large posterolateral branch with 80% mid lesion.  SEVERE PULMONARY VENOUS HYPERTENSION consistent with ACUTE COMBINED SYSTOLIC AND DIASTOLIC HEART FAILURE: ? LV P-EDP 126/18 mmHg - 33 mmHg; AOP 125/58 mmHg-MAP 78 mmHg. ? RAP mean 13-15 mmHg; RVP-EDP 65/3 mmHg - 13 mmHg ? PAP-mean 73/23 mmHg - 42 mmHg; PCWP 46/62 (large V wave)-mean 39 mmHg RECOMMENDATIONS  Patient has severe two-vessel disease with the most prominent lesion being in the mid LAD.  Given the extent of CHF with severely elevated LVEDP and PCWP, I discussed with Dr. Burt Knack and we agreed that the best course of action will be to stabilize the patient with IABP to allow for diuresis.  The case is been monitored by Dr. Haroldine Laws who recommended IV milrinone infusion.  He was given IV Lasix in the Cath Lab we will monitor his  urine output overnight.  Of note, he did have slightly elevated creatinine from his baseline likely related to congestive nephropathy-we will need to closely monitor renal function.  He is already on Plavix.  Will restart IV heparin at lower dose for IVP until 6 hours post sheath removal and then will go back to full dose.  Plan would be to stage PCI of the LAD at least which would be a very long lesion covering from the 60% to the 80% stenoses.  If successful, would then consider addressing the LCx.  We would need to would make the decision about timing for this procedure based on how stable the patient is overnight and how well he is diuresed. Glenetta Hew, M.D., M.S. Interventional Cardiologist Pager # [phone] Phone # [phone] [address] [place], [place] [address]   Result Date: 01/16/2020  Extensive mid-distal LAD disease: Mid LAD-1 lesion is 60% stenosed. Mid LAD-2 lesion is 95% stenosed -eccentric shelflike calcified lesion. Mid LAD-3 lesion is 45% stenosed with 70% stenosed side branch in 3rd Diag. Dist LAD lesion is 80% stenosed.  Ost Cx to Prox Cx lesion is 30% stenosed.  Prox Cx lesion is 60% stenosed. Prox Cx to Mid Cx lesion is 80% stenosed.  Prox RCA to Mid RCA previously placed stent is 5% stenosed.  Mid RCA lesion is 40% stenosed.  RPDA lesion is 60% stenosed.  2nd RPL lesion is 80% stenosed.  LV end diastolic pressure is severely elevated.  There is no aortic valve stenosis.  Hemodynamic findings consistent with moderate pulmonary hypertension.  SUMMARY  Severe multivessel disease: ? Ostial Left Main 30 to 40%,  severe diffuse proximal to mid LAD segmental lesions of 60% with an intervening shelflike lesion in segment of 95% (calcified).  Shortly downstream of this lesion there was another 80% stenosis; LCx as proximal 30 and 60% lesions just after the AV groove circumflex.  Then in the mid section there is a long segment of roughly 80% stenosis followed by a  long lateral OM branch/distal LCx; ? patent RCA stent with diffuse mild disease-bifurcates into RPDA with ostial and proximal 60% long lesion as well as a little larger posterior AV groove branch that gives off 1 small OM1 large posterolateral branch with 80% mid lesion.  SEVERE PULMONARY VENOUS HYPERTENSION consistent with ACUTE COMBINED SYSTOLIC AND DIASTOLIC HEART FAILURE: ? LV P-EDP 126/18 mmHg - 33 mmHg; AOP 125/58 mmHg-MAP 78 mmHg. ? RAP mean 13-15 mmHg; RVP-EDP 65/3 mmHg - 13 mmHg ? PAP-mean 73/23 mmHg - 42 mmHg; PCWP 46/62 (large V wave)-mean 39 mmHg RECOMMENDATIONS  Patient has severe two-vessel disease with the most prominent lesion being in the mid LAD.  Given the extent of CHF with severely elevated LVEDP and PCWP, I discussed with Dr. Burt Knack and we agreed that the best course of action will be to stabilize the patient with IABP to allow for diuresis.  The case is been monitored by Dr. Haroldine Laws who recommended IV milrinone infusion.  He was given IV Lasix in the Cath Lab we will monitor his urine output overnight.  Of note, he did have slightly elevated creatinine from his baseline likely related to congestive nephropathy-we will need to closely monitor renal function.  He is already on Plavix.  Will restart IV heparin at lower dose for IVP until 6 hours post sheath removal and then will go back to full dose.  Plan would be to stage PCI of the LAD at least which would be a very long lesion covering from the 60% to the 80% stenoses.  If successful, would then consider addressing the LCx.  We would need to would make the decision about timing for this procedure based on  how stable the patient is overnight and how well he is diuresed. Glenetta Hew, M.D., M.S. Interventional Cardiologist Pager # [phone] Phone # [phone] [address] [place],  [address]  DG CHEST PORT 1 VIEW  Result Date: 01/17/2020 CLINICAL DATA:  Check central venous catheter placement EXAM: PORTABLE  CHEST 1 VIEW COMPARISON:  Film from earlier in the same day. FINDINGS: Cardiac shadow remains enlarged. Pacing device is again seen. Intra-aortic balloon pump is noted just below the aortic knob new from the prior exam. Patchy parenchymal opacities are noted bilaterally likely related increasing edema. No sizable effusion is seen. No pneumothorax is noted. Right-sided PICC line is seen within the midportion of the right atrium. This could be withdrawn 2-3 cm. IMPRESSION: Intra-aortic balloon pump as described above. Right-sided PICC line deep in the right atrium which could be withdrawn 2-3 cm. Increasing parenchymal edema bilaterally when compared with the prior exam. Electronically Signed   By: Inez Catalina M.D.   On: 01/11/2020 21:27   DG Chest Port 1 View  Result Date: 01/31/2020 CLINICAL DATA:  Shortness of breath. EXAM: PORTABLE CHEST 1 VIEW COMPARISON:  March 26, 2018. FINDINGS: Stable cardiomediastinal silhouette. Mild central pulmonary vascular congestion is noted. Left-sided pacemaker is unchanged in position. Atherosclerosis of thoracic aorta is noted. No pneumothorax or pleural effusion is noted. Mild bibasilar subsegmental atelectasis or edema is noted. Bony thorax is unremarkable. IMPRESSION: Aortic atherosclerosis. Stable mild central pulmonary vascular congestion. Mild bibasilar subsegmental atelectasis or edema is noted. Aortic Atherosclerosis (ICD10-I70.0). Electronically Signed   By: Marijo Conception M.D.   On: 01/17/2020 14:48      Medications:     Current Medications: . amiodarone  200 mg Oral Once per day on Mon Tue Wed Thu Fri Sat  . atorvastatin  80 mg Oral q1800  . chlorhexidine  15 mL Mouth Rinse BID  . Chlorhexidine Gluconate Cloth  6 each Topical Daily  . Chlorhexidine Gluconate Cloth  6 each Topical Daily  . gabapentin  300 mg Oral Daily  . gabapentin  600 mg Oral Daily  . insulin aspart  0-15 Units Subcutaneous Q4H  . iron polysaccharides  150 mg Oral Daily  .  latanoprost  1 drop Both Eyes QHS  . mouth rinse  15 mL Mouth Rinse q12n4p  . sodium chloride flush  10-40 mL Intracatheter Q12H  . sodium chloride flush  3 mL Intravenous Q12H  . vitamin B-12  500 mcg Oral Daily  . Vitamin D (Ergocalciferol)  50,000 Units Oral Q Thu     Infusions: . sodium chloride 10 mL/hr at 01/18/2020 2100  . sodium chloride    . sodium chloride    . sodium chloride 10 mL/hr at 01/16/20 1000  . sodium chloride    . sodium chloride    . sodium chloride 10 mL/hr at 01/16/20 1000  . furosemide (LASIX) infusion 15 mg/hr (01/16/20 1000)  . heparin 1,050 Units/hr (01/16/20 1000)  . milrinone 0.25 mcg/kg/min (01/16/20 1000)  . nitroGLYCERIN 5 mcg/min (01/13/2020 1925)  . potassium chloride         Patient Profile   Mr Thornes is a 86 year old with history of  CAD with STEMI in 01/2018 treated with DES to RCA, chronic systolic CHF/ischemic cardiomyopathy with EF 50-55% in 03/2019, ventricular tachycardia s/p ICD, hypertension, hyperlipidemia, type 2 diabetes mellitus, and BPH.   Admitted with chest pain and shortness of breath. I  Assessment/Plan   1. NSTEMI  -Known coronary disease. In  20219 had DES to RCA.  -LHC 01/16/2020 with multivessel disease.   Per Dr [name]. Plan would be to stage PCI of the LAD at least which would be a very long lesion covering from the 60% to the 80% stenoses.  If successful, would then consider addressing the LCx.   - No chest pain. On heparin.  - On atorvastatin 80 mg daily    2. Cardiogenic Shock/Acute Systolic Heart Failure in the setting of NSTEMI - ICM. ECHO 2020 EF 55%---> EF today down 40-45%  -IABP placed in cath lab 5/12. -Continue milrione 0.25 mcg. CO-OX 82%.  - Volume status elevated. Diurese with lasix drip 15 mg per hour.  - Place PICC, follow CVP and CO-OX   3. Acute Hypoxic Respiratory Failure Sats in the ED  80s . Remains on Bipap.  Hopefully can wean as he diureses.   4. Elevated Lactic Acid  Lactic Acid on  admit 2.5>1.7   5. OSA  Uses CPAP at home  Will need to continue  6. H/O of VT Has ICD Continue amiodarone 200 mg daily  7. AKI  Creatinine baseline 1-1.2 - Possible contrast nephropathy -Todays creatinine is 1.5.   Length of Stay: 1  Amy Clegg, NP  01/16/2020, 10:41 AM  Advanced Heart Failure Team Pager [phone] (M-F; 7a - 4p)  Please contact [facility] for night-coverage after hours (4p -7a ) and weekends on [email or web address]  Agree with above  86 y/o male with h.o CAD, DM2, VT s/p ICD, HTN admitted with NSTEMI and acute systolic HF. Cath by Dr. [name] last night revealed severe 2v CAD in LD and LCX with borderline ostial LM lesion. Filling pressures markedly elevated with low output. Lactic acid up. I discussed with them last night and IABP placed and milrinone started.   Now on IABP at 1:1 and milrinone 0.25. He is starting to diurese. Breathing better. Swan numbers imprvoed (I wedged the catheter personally and was 19). No further CP.   General:  Elderly. Lying in bed  No resp difficulty HEENT: normal Neck: supple. no JVD. Carotids 2+ bilat; no bruits. No lymphadenopathy or thryomegaly appreciated. Cor: PMI nondisplaced. Regular rate & rhythm. No rubs, gallops or murmurs. Lungs: clear Abdomen: soft, nontender, nondistended. No hepatosplenomegaly. No bruits or masses. Good bowel sounds. Extremities: no cyanosis, clubbing, rash, edema RFA IABP Neuro: alert & orientedx3, cranial nerves grossly intact. moves all 4 extremities w/o difficulty. Affect pleasant   He remains quite tenuous. Hemodynamics improved with IABP and milrinone. Echo with EF 40-45% Will continue diuresis. Possible PCI with atherectomy and stenting tomorrow. D/w Dr. [name] at bedside.   CRITICAL CARE Performed by: Glori Bickers  Total critical care time: 45 minutes  Critical care time was exclusive of separately billable procedures and treating other patients.  Critical care was necessary to treat  or prevent imminent or life-threatening deterioration.  Critical care was time spent personally by me (independent of midlevel providers or residents) on the following activities: development of treatment plan with patient and/or surrogate as well as nursing, discussions with consultants, evaluation of patient's response to treatment, examination of patient, obtaining history from patient or surrogate, ordering and performing treatments and interventions, ordering and review of laboratory studies, ordering and review of radiographic studies, pulse oximetry and re-evaluation of patient's condition.   Glori Bickers, MD  9:12 PM

## 2020-01-16 NOTE — Progress Notes (Signed)
ANTICOAGULATION CONSULT NOTE   Pharmacy Consult for heparin Indication: chest pain/ACS  Assessment: 76 YOM with elevated troponin to start IV heparin for ACS. H/H low. Plt wnl. Heparin 4000 units IV bolus given in the ED  Patient now s/p left and right heart cath with IABP insertion. Per the cath note, restart heparin upon arrival to the CCU. Plan for staged PCI. Heparin level 0.47 units/ml  Goal of Therapy:  Heparin level 0.2-0.5 units/ml Monitor platelets by anticoagulation protocol: Yes   Plan:  -Continue heparin infusion at 1050 units/hr Check heparin level later today to confirm Thanks for allowing pharmacy to be a part of this patient's care.  Excell Seltzer, PharmD Clinical Pharmacist  01/16/2020, 4:59 AM

## 2020-01-16 NOTE — Progress Notes (Signed)
ANTICOAGULATION CONSULT NOTE   Pharmacy Consult for heparin Indication: chest pain/ACS  Assessment: 88 YOM with elevated troponin to start IV heparin for ACS. H/H low. Plt wnl. Heparin 4000 units IV bolus given in the ED  Patient now s/p left and right heart cath with IABP insertion. Heparin remains therapeutic.   Goal of Therapy:  Heparin level 0.2-0.5 units/ml Monitor platelets by anticoagulation protocol: Yes   Plan:  -Continue heparin infusion at 1050 units/hr -Daily heparin level and CBC   Arrie Senate, PharmD, BCPS Clinical Pharmacist [phone] Please check AMION for all [facility] numbers 01/16/2020

## 2020-01-16 NOTE — Progress Notes (Addendum)
Progress Note  Patient Name: Daryl Rodriguez Date of Encounter: 01/16/2020  Primary Cardiologist: [name], MD   Patient Profile     86 y.o. male with a history of inferior STEMI with RCA PCI in May [id number] (complicated by recurrent VT leading to ICD placement).  Has moderately reduced EF by echo at that time with some improvement.  Was in his usual state of health doing well until the evening of 01/14/2020 when he started having profound dyspnea while cleaning his backyard shop.  He had to stop several times to catch his breath.  Denied any chest pain at that time, however he awoke this morning on 01/11/2020 with pressure heavy sensation that he said was a pain in his chest of 8 out of 10 associate with profound dyspnea.  Normal routine activities did not seem to help it.  He finally contacted EMS with significant dyspnea.  Upon arrival EMS he was in acute respiratory distress, lactate was over 2.  He was on BiPAP upon arrival to the ER was weaned down to nasal cannula oxygen.  Initial troponin VI 100 went to over 2000.  He was given 40 mg IV Lasix.  --> Taken for urgent cardiac catheterization revealing moderate to severe pulmonary venous hypertension and severe multivessel CAD.  IABP placed stabilization and diuresis.  Plan for possible staged PCI.   Principal Problem:   NSTEMI (non-ST elevated myocardial infarction) (Daryl Rodriguez) Active Problems:   Acute pulmonary edema (HCC)   Acute respiratory failure with hypoxia (HCC)   Coronary artery disease involving native coronary artery of native heart with unstable angina pectoris (HCC)   Acute on chronic combined systolic and diastolic CHF (congestive heart failure) (Daryl Rodriguez)   History of ST elevation myocardial infarction involving right coronary artery (Daryl Rodriguez)   V tach (HCC)-chronic history of   Acute renal injury due to circulatory failure (HCC)   Dyslipidemia, goal LDL below 70   Essential hypertension   Type 2 diabetes mellitus with complication,  without long-term current use of insulin ([name])   Subjective   Feels much better today than he did yesterday.  Breathing is better.  No more chest pain.  Just hungry  Tolerating IABP BiPAP.  Required additional boluses Lasix overnight and is now on Lasix infusion along with milrinone.  Probably having brisk urine output.  Inpatient Medications    Scheduled Meds: . amiodarone  200 mg Oral Once per day on Mon Tue Wed Thu Fri Sat  . atorvastatin  80 mg Oral q1800  . Chlorhexidine Gluconate Cloth  6 each Topical Daily  . Chlorhexidine Gluconate Cloth  6 each Topical Daily  . gabapentin  300 mg Oral Daily  . gabapentin  600 mg Oral Daily  . heparin      . insulin aspart  0-15 Units Subcutaneous Q4H  . iron polysaccharides  150 mg Oral Daily  . latanoprost  1 drop Both Eyes QHS  . metoprolol succinate  50 mg Oral Daily  . sodium chloride flush  10-40 mL Intracatheter Q12H  . sodium chloride flush  3 mL Intravenous Q12H  . vitamin B-12  500 mcg Oral Daily  . Vitamin D (Ergocalciferol)  50,000 Units Oral Q Thu   Continuous Infusions: . sodium chloride 10 mL/hr at 01/23/2020 2100  . sodium chloride    . sodium chloride    . sodium chloride    . sodium chloride    . sodium chloride 50 mL/hr at 01/16/20 0200  . sodium chloride    .  sodium chloride 10 mL/hr at 01/16/20 0200  . furosemide (LASIX) infusion 10 mg/hr (01/16/20 0200)  . heparin 1,050 Units/hr (01/16/20 0200)  . milrinone    . milrinone 0.25 mcg/kg/min (01/16/20 0200)  . nitroGLYCERIN    . nitroGLYCERIN 5 mcg/min (01/06/2020 1925)   PRN Meds: sodium chloride, Place/Maintain arterial line **AND** sodium chloride, Place/Maintain arterial line **AND** sodium chloride, sodium chloride, sodium chloride, acetaminophen, hydrALAZINE, nitroGLYCERIN, ondansetron (ZOFRAN) IV, sodium chloride flush, sodium chloride flush   Vital Signs    Vitals:   01/16/20 0115 01/16/20 0130 01/16/20 0145 01/16/20 0200  BP:    (!) 119/42  Pulse:  89 92 92 83  Resp: 15 15 (!) 22 16  Temp:      TempSrc:      SpO2: 98% 98% 99% 98%  Weight:      Height:        Intake/Output Summary (Last 24 hours) at 01/16/2020 0230 Last data filed at 01/16/2020 0200 Gross per 24 hour  Intake 584.42 ml  Output 835 ml  Net -250.58 ml   Last 3 Weights 01/10/2020 01/25/2020 10/25/2019  Weight (lbs) 215 lb 9.8 oz 212 lb 214 lb 6.4 oz  Weight (kg) 97.8 kg 96.163 kg 97.251 kg      Telemetry    Sinus rhythm- Personally Reviewed  ECG    No EKG done today.- Personally Reviewed  Physical Exam   Physical Exam  Constitutional: He is oriented to person, place, and time. He appears well-developed. No distress.  Resting in bed comfortably with BiPAP in place and IABP.  No obvious distress.  Otherwise healthy-appearing.  HENT:  Head: Normocephalic and atraumatic.  Neck: JVD (Difficult to assess, but seems elevated) present.  Cardiovascular: Normal rate and regular rhythm.  Pulmonary/Chest: No respiratory distress. He has no wheezes. He has no rales.  Tolerating BiPAP.  Mild basal rales, but otherwise clear  Musculoskeletal:        General: Edema (11 2+ lower extremity) present. Normal range of motion.     Cervical back: Normal range of motion and neck supple.  Neurological: He is alert and oriented to person, place, and time.  Psychiatric: He has a normal mood and affect. His behavior is normal. Judgment and thought content normal.  Vitals reviewed.    Labs    High Sensitivity Troponin:   Recent Labs  Lab 01/22/2020 1402 01/12/2020 1548  TROPONINIHS 603* 2,319*      Chemistry Recent Labs  Lab 01/07/2020 1402 01/23/2020 1850 01/26/2020 2230 01/20/2020 2230 01/24/2020 2252 01/08/2020 2342 01/09/2020 2351  NA 138   < > 144   < > 138 139 138  K 4.5   < > 2.4*   < > 3.6 3.5 3.5  CL 104  --  122*  --   --  103 106  CO2 24  --  15*  --   --   --  21*  GLUCOSE 252*  --  123*  --   --  186* 188*  BUN 23  --  18  --   --  25* 25*  CREATININE 1.64*  --   0.83  --   --  1.50* 1.66*  CALCIUM 8.9  --  5.0*  --   --   --  8.8*  GFRNONAA 38*  --  >60  --   --   --  38*  GFRAA 44*  --  >60  --   --   --  44*  ANIONGAP 10  --  7  --   --   --  11   < > = values in this interval not displayed.     Hematology Recent Labs  Lab 01/23/2020 1402 01/12/2020 1850 01/10/2020 2230 01/22/2020 2252 01/18/2020 2342  WBC 8.3  --  10.7*  --   --   RBC 3.51*  --  3.50*  --   --   HGB 11.8*   < > 11.6* 11.6* 11.2*  HCT 36.2*   < > 35.1* 34.0* 33.0*  MCV 103.1*  --  100.3*  --   --   MCH 33.6  --  33.1  --   --   MCHC 32.6  --  33.0  --   --   RDW 14.7  --  14.8  --   --   PLT 171  --  175  --   --    < > = values in this interval not displayed.    BNP Recent Labs  Lab 01/04/2020 1403  BNP 597.8*     DDimer No results for input(s): DDIMER in the last 168 hours.   Radiology    CARDIAC CATHETERIZATION  Addendum Date: 01/16/2020    Extensive mid-distal LAD disease: Mid LAD-1 lesion is 60% stenosed. Mid LAD-2 lesion is 95% stenosed -eccentric shelflike calcified lesion. Mid LAD-3 lesion is 45% stenosed with 70% stenosed side branch in 3rd Diag. Dist LAD lesion is 80% stenosed.  Ost Cx to Prox Cx lesion is 30% stenosed.  Prox Cx lesion is 60% stenosed. Prox Cx to Mid Cx lesion is 80% stenosed.  Prox RCA to Mid RCA previously placed stent is 5% stenosed.  Mid RCA lesion is 40% stenosed.  RPDA lesion is 60% stenosed.  2nd RPL lesion is 80% stenosed.  LV end diastolic pressure is severely elevated.  There is no aortic valve stenosis.  Hemodynamic findings consistent with moderate pulmonary hypertension.  Mid LAD-2 lesion is 95% stenosed.  Prox Cx to Mid Cx lesion is 80% stenosed.  SUMMARY  Severe multivessel disease: ? Ostial Left Main 30 to 40%,  severe diffuse proximal to mid LAD segmental lesions of 60% with an intervening shelflike lesion in segment of 95% (calcified).  Shortly downstream of this lesion there was another 80% stenosis; LCx as proximal  30 and 60% lesions just after the AV groove circumflex.  Then in the mid section there is a long segment of roughly 80% stenosis followed by a long lateral OM branch/distal LCx; ? patent RCA stent with diffuse mild disease-bifurcates into RPDA with ostial and proximal 60% long lesion as well as a little larger posterior AV groove branch that gives off 1 small OM1 large posterolateral branch with 80% mid lesion.  SEVERE PULMONARY VENOUS HYPERTENSION consistent with ACUTE COMBINED SYSTOLIC AND DIASTOLIC HEART FAILURE: ? LV P-EDP 126/18 mmHg - 33 mmHg; AOP 125/58 mmHg-MAP 78 mmHg. ? RAP mean 13-15 mmHg; RVP-EDP 65/3 mmHg - 13 mmHg ? PAP-mean 73/23 mmHg - 42 mmHg; PCWP 46/62 (large V wave)-mean 39 mmHg RECOMMENDATIONS  Patient has severe two-vessel disease with the most prominent lesion being in the mid LAD.  Given the extent of CHF with severely elevated LVEDP and PCWP, I discussed with Dr. Burt Knack and we agreed that the best course of action will be to stabilize the patient with IABP to allow for diuresis.  The case is been monitored by Dr. Haroldine Laws who recommended IV milrinone infusion.  He was given IV Lasix in the Cath Lab we will monitor  his urine output overnight.  Of note, he did have slightly elevated creatinine from his baseline likely related to congestive nephropathy-we will need to closely monitor renal function.  He is already on Plavix.  Will restart IV heparin at lower dose for IVP until 6 hours post sheath removal and then will go back to full dose.  Plan would be to stage PCI of the LAD at least which would be a very long lesion covering from the 60% to the 80% stenoses.  If successful, would then consider addressing the LCx.  We would need to would make the decision about timing for this procedure based on how stable the patient is overnight and how well he is diuresed. Glenetta Hew, M.D., M.S. Interventional Cardiologist Pager # [phone] Phone # [phone] [address] [place], [place] [address]   Result Date: 01/16/2020  Extensive mid-distal LAD disease: Mid LAD-1 lesion is 60% stenosed. Mid LAD-2 lesion is 95% stenosed -eccentric shelflike calcified lesion. Mid LAD-3 lesion is 45% stenosed with 70% stenosed side branch in 3rd Diag. Dist LAD lesion is 80% stenosed.  Ost Cx to Prox Cx lesion is 30% stenosed.  Prox Cx lesion is 60% stenosed. Prox Cx to Mid Cx lesion is 80% stenosed.  Prox RCA to Mid RCA previously placed stent is 5% stenosed.  Mid RCA lesion is 40% stenosed.  RPDA lesion is 60% stenosed.  2nd RPL lesion is 80% stenosed.  LV end diastolic pressure is severely elevated.  There is no aortic valve stenosis.  Hemodynamic findings consistent with moderate pulmonary hypertension.  SUMMARY  Severe multivessel disease: ? Ostial Left Main 30 to 40%,  severe diffuse proximal to mid LAD segmental lesions of 60% with an intervening shelflike lesion in segment of 95% (calcified).  Shortly downstream of this lesion there was another 80% stenosis; LCx as proximal 30 and 60% lesions just after the AV groove circumflex.  Then in the mid section there is a long segment of roughly 80% stenosis followed by a long lateral OM branch/distal LCx; ? patent RCA stent with diffuse mild disease-bifurcates into RPDA with ostial and proximal 60% long lesion as well as a little larger posterior AV groove branch that gives off 1 small OM1 large posterolateral branch with 80% mid lesion.  SEVERE PULMONARY VENOUS HYPERTENSION consistent with ACUTE COMBINED SYSTOLIC AND DIASTOLIC HEART FAILURE: ? LV P-EDP 126/18 mmHg - 33 mmHg; AOP 125/58 mmHg-MAP 78 mmHg. ? RAP mean 13-15 mmHg; RVP-EDP 65/3 mmHg - 13 mmHg ? PAP-mean 73/23 mmHg - 42 mmHg; PCWP 46/62 (large V wave)-mean 39 mmHg RECOMMENDATIONS  Patient has severe two-vessel disease with the most prominent lesion being in the mid LAD.  Given the extent of CHF with severely elevated LVEDP and PCWP, I discussed with Dr. Burt Knack and we agreed  that the best course of action will be to stabilize the patient with IABP to allow for diuresis.  The case is been monitored by Dr. Haroldine Laws who recommended IV milrinone infusion.  He was given IV Lasix in the Cath Lab we will monitor his urine output overnight.  Of note, he did have slightly elevated creatinine from his baseline likely related to congestive nephropathy-we will need to closely monitor renal function.  He is already on Plavix.  Will restart IV heparin at lower dose for IVP until 6 hours post sheath removal and then will go back to full dose.  Plan would be to stage PCI of the LAD at least which would be a  very long lesion covering from the 60% to the 80% stenoses.  If successful, would then consider addressing the LCx.  We would need to would make the decision about timing for this procedure based on how stable the patient is overnight and how well he is diuresed. Glenetta Hew, M.D., M.S. Interventional Cardiologist Pager # [phone] Phone # [phone] [address] [place], [place] [address]  DG CHEST PORT 1 VIEW  Result Date: 01/27/2020 CLINICAL DATA:  Check central venous catheter placement EXAM: PORTABLE CHEST 1 VIEW COMPARISON:  Film from earlier in the same day. FINDINGS: Cardiac shadow remains enlarged. Pacing device is again seen. Intra-aortic balloon pump is noted just below the aortic knob new from the prior exam. Patchy parenchymal opacities are noted bilaterally likely related increasing edema. No sizable effusion is seen. No pneumothorax is noted. Right-sided PICC line is seen within the midportion of the right atrium. This could be withdrawn 2-3 cm. IMPRESSION: Intra-aortic balloon pump as described above. Right-sided PICC line deep in the right atrium which could be withdrawn 2-3 cm. Increasing parenchymal edema bilaterally when compared with the prior exam. Electronically Signed   By: Inez Catalina M.D.   On: 01/08/2020 21:27   DG Chest Port 1 View  Result  Date: 01/29/2020 CLINICAL DATA:  Shortness of breath. EXAM: PORTABLE CHEST 1 VIEW COMPARISON:  March 26, 2018. FINDINGS: Stable cardiomediastinal silhouette. Mild central pulmonary vascular congestion is noted. Left-sided pacemaker is unchanged in position. Atherosclerosis of thoracic aorta is noted. No pneumothorax or pleural effusion is noted. Mild bibasilar subsegmental atelectasis or edema is noted. Bony thorax is unremarkable. IMPRESSION: Aortic atherosclerosis. Stable mild central pulmonary vascular congestion. Mild bibasilar subsegmental atelectasis or edema is noted. Aortic Atherosclerosis (ICD10-I70.0). Electronically Signed   By: Marijo Conception M.D.   On: 01/19/2020 14:48    Cardiac Studies     01/23/2020 =Cardiac Cath:  PRE-OPERATIVE DIAGNOSIS:  NSTEMI, ACUTE ON CHRONIC MILD SYSTOLIC AND DIASTOLIC HEART FAILURE, acute respiratory failure with hypoxia  POST-OPERATIVE DIAGNOSIS:    Severe multivessel disease:  ? Ostial Left Main 30 to 40%,   severe diffuse proximal to mid LAD segmental lesions of 60% with an intervening shelflike lesion in segment of 95% (calcified).  Shortly downstream of this lesion there was another 85% stenosis; LCx as proximal 30 and 60% lesions just after the AV groove circumflex.  Then in the mid section there is a long segment of roughly 80% stenosis followed by a long lateral OM branch/distal LCx;  ? patent RCA stent with diffuse mild disease-bifurcates into RPDA with ostial and proximal 60% long lesion as well as a little larger posterior AV groove branch that gives off 1 small OM1 large posterolateral branch with 80% mid lesion.  SEVERE PULMONARY VENOUS HYPERTENSION consistent with ACUTE COMBINED SYSTOLIC AND DIASTOLIC HEART FAILURE:  ? LV P-EDP 126/18 mmHg - 33 mmHg; AOP 125/58 mmHg-MAP 78 mmHg. ? RAP mean 13-15 mmHg; RVP-EDP 65/3 mmHg - 13 mmHg ? PAP-mean 73/23 mmHg - 42 mmHg; PCWP 46/62 (large V wave)-mean 39 mmHg    01/16/2020 = 2D Echo:   Mildly reduced  EF 40 and 45%-hypokinesis of mid apical anteroseptal anterior and anterolateral wall.  Mild to moderate LA dilation.  Mild LA dilation.  Dilated IVC with respiratory variation suggesting elevated RAP/CVP.Marland Kitchen GRII DD.  When compared to July 2020-EF was 50 to 55% with GRII DD.  No obvious wall motion normality noted.   Assessment & Plan    Principal Problem:  NSTEMI (non-ST elevated myocardial infarction) ([facility]); Coronary artery disease involving native coronary artery of native heart with unstable angina pectoris ([facility])  Noted to have severe calcified LAD and LCx disease with progression from prior cath.  Thankfully last stent is patent.  PCI not performed secondary to acute CHF, pulmonary edema with severely elevated LVEDP and calcified lesions.  Continue on aspirin and Plavix.  IABP placed to stabilize -> we started on IV heparin now on treatment dose drip.  We will need to discuss with interventional colleagues timing of potential PCI of the LAD and potentially the LCx.  May want to wait to allow for contrast washout as he has had elevated creatinine.  Active Problems:     Acute respiratory failure with hypoxia (HCC) with  Acute pulmonary edema (HCC) from Acute on chronic combined systolic and diastolic CHF (congestive heart failure) (HCC)   Remains on BiPAP for oxygenation.  Oxygen levels did desat into the low 80s quickly to BiPAP.  Hopefully has more diuresis continues, we will be able to wean back to nasal cannula or Ventimask.  This would allow him to eat.  IABP placed in Cath Lab to allow for offloading of the ventricle.  IV Lasix diuresis -> we will continue IV Lasix drip for now as he is starting to have good output.  Continue midodrine.  Will discuss with heart failure team.    Acute renal injury due to circulatory failure ([facility])  Creatinine bumped just a little bit to 1.66 yesterday, but now that down to 1.4.  Closely monitor after contrast load.    Dyslipidemia, goal  LDL below 70  Has been on 80 mg atorvastatin at home.  Currently n.p.o. will restart when able take p.o.    Essential hypertension  Blood pressure is well preserved.  Holding home dose amlodipine and metoprolol.    Type 2 diabetes mellitus with complication, without long-term current use of insulin (HCC)  Was on oral Amaryl at home.  Sliding scale insulin    History of ST elevation myocardial infarction involving right coronary artery (HCC) -RCA stent widely patent.    V tach (HCC)-chronic history of: No recurrence on telemetry.  On amiodarone maintenance dose.  ICD in place.   CRITICAL CARE Performed by: Glenetta Hew   Total critical care time: 45 minutes Patient is critically ill with multiorgan system involvement. Critical care time was exclusive of separately billable procedures and treating other patients.  Critical care was necessary to treat or prevent imminent or life-threatening deterioration.  Critical care was time spent personally by me on the following activities: development of treatment plan with patient and/or surrogate as well as nursing, discussions with consultants, evaluation of patient's response to treatment, examination of patient, obtaining history from patient or surrogate, ordering and performing treatments and interventions, ordering and review of laboratory studies, ordering and review of radiographic studies, pulse oximetry and re-evaluation of patient's condition.   For questions or updates, please contact [facility] Please consult [email or web address] for contact info under        Signed, Glenetta Hew, MD  01/16/2020, 2:30 AM

## 2020-01-16 NOTE — Progress Notes (Signed)
  Echocardiogram 2D Echocardiogram has been performed.  Daryl Rodriguez 01/16/2020, 8:55 AM

## 2020-01-16 NOTE — Progress Notes (Signed)
Peripherally Inserted Central Catheter Placement  The IV Nurse has discussed with the patient and/or persons authorized to consent for the patient, the purpose of this procedure and the potential benefits and risks involved with this procedure.  The benefits include less needle sticks, lab draws from the catheter, and the patient may be discharged home with the catheter. Risks include, but not limited to, infection, bleeding, blood clot (thrombus formation), and puncture of an artery; nerve damage and irregular heartbeat and possibility to perform a PICC exchange if needed/ordered by physician.  Alternatives to this procedure were also discussed.  Bard Power PICC patient education guide, fact sheet on infection prevention and patient information card has been provided to patient /or left at bedside.    PICC Placement Documentation  PICC Double Lumen 01/16/20 PICC Right Cephalic 40 cm 0 cm (Active)  Indication for Insertion or Continuance of Line Vasoactive infusions 01/16/20 1829  Exposed Catheter (cm) 0 cm 01/16/20 1829  Site Assessment Clean;Dry;Intact 01/16/20 1829  Lumen #1 Status Flushed;Saline locked;Blood return noted 01/16/20 1829  Lumen #2 Status Flushed;Saline locked;Blood return noted 01/16/20 1829  Dressing Type Transparent 01/16/20 1829  Dressing Status Clean;Dry;Intact;Antimicrobial disc in place 01/16/20 1829  Dressing Intervention New dressing 01/16/20 1829  Dressing Change Due 01/18/2020 01/16/20 1829       Gordan Payment 01/16/2020, 6:30 PM

## 2020-01-17 ENCOUNTER — Encounter (HOSPITAL_COMMUNITY): Admission: EM | Disposition: E | Payer: Self-pay | Source: Home / Self Care | Attending: Cardiology

## 2020-01-17 DIAGNOSIS — E872 Acidosis, unspecified: Secondary | ICD-10-CM | POA: Diagnosis present

## 2020-01-17 DIAGNOSIS — I5042 Chronic combined systolic (congestive) and diastolic (congestive) heart failure: Secondary | ICD-10-CM

## 2020-01-17 DIAGNOSIS — I25110 Atherosclerotic heart disease of native coronary artery with unstable angina pectoris: Secondary | ICD-10-CM

## 2020-01-17 DIAGNOSIS — I255 Ischemic cardiomyopathy: Secondary | ICD-10-CM

## 2020-01-17 LAB — COOXEMETRY PANEL
Carboxyhemoglobin: 1.7 % — ABNORMAL HIGH (ref 0.5–1.5)
Carboxyhemoglobin: 1.7 % — ABNORMAL HIGH (ref 0.5–1.5)
Methemoglobin: 1.1 % (ref 0.0–1.5)
Methemoglobin: 1.1 % (ref 0.0–1.5)
O2 Saturation: 77.1 %
O2 Saturation: 70.7 %
Total hemoglobin: 11.6 g/dL — ABNORMAL LOW (ref 12.0–16.0)
Total hemoglobin: 12.2 g/dL (ref 12.0–16.0)

## 2020-01-17 LAB — BASIC METABOLIC PANEL
Anion gap: 11 (ref 5–15)
BUN: 25 mg/dL — ABNORMAL HIGH (ref 8–23)
CO2: 25 mmol/L (ref 22–32)
Calcium: 8.7 mg/dL — ABNORMAL LOW (ref 8.9–10.3)
Chloride: 104 mmol/L (ref 98–111)
Creatinine, Ser: 1.5 mg/dL — ABNORMAL HIGH (ref 0.61–1.24)
GFR calc Af Amer: 49 mL/min — ABNORMAL LOW (ref >60)
GFR calc non Af Amer: 42 mL/min — ABNORMAL LOW (ref >60)
Glucose, Bld: 130 mg/dL — ABNORMAL HIGH (ref 70–99)
Potassium: 3.7 mmol/L (ref 3.5–5.1)
Sodium: 140 mmol/L (ref 135–145)

## 2020-01-17 LAB — GLUCOSE, CAPILLARY
Glucose-Capillary: 123 mg/dL — ABNORMAL HIGH (ref 70–99)
Glucose-Capillary: 110 mg/dL — ABNORMAL HIGH (ref 70–99)
Glucose-Capillary: 148 mg/dL — ABNORMAL HIGH (ref 70–99)
Glucose-Capillary: 133 mg/dL — ABNORMAL HIGH (ref 70–99)
Glucose-Capillary: 173 mg/dL — ABNORMAL HIGH (ref 70–99)

## 2020-01-17 LAB — CBC
HCT: 32.4 % — ABNORMAL LOW (ref 39.0–52.0)
Hemoglobin: 10.7 g/dL — ABNORMAL LOW (ref 13.0–17.0)
MCH: 33.2 pg (ref 26.0–34.0)
MCHC: 33 g/dL (ref 30.0–36.0)
MCV: 100.6 fL — ABNORMAL HIGH (ref 80.0–100.0)
Platelets: 149 10*3/uL — ABNORMAL LOW (ref 150–400)
RBC: 3.22 MIL/uL — ABNORMAL LOW (ref 4.22–5.81)
RDW: 14.6 % (ref 11.5–15.5)
WBC: 8.8 10*3/uL (ref 4.0–10.5)
nRBC: 0 % (ref 0.0–0.2)

## 2020-01-17 LAB — HEPARIN LEVEL (UNFRACTIONATED): Heparin Unfractionated: 0.24 IU/mL — ABNORMAL LOW (ref 0.30–0.70)

## 2020-01-17 LAB — MAGNESIUM: Magnesium: 2 mg/dL (ref 1.7–2.4)

## 2020-01-17 SURGERY — CORONARY ATHERECTOMY
Anesthesia: LOCAL

## 2020-01-17 MED ORDER — AMIODARONE HCL IN DEXTROSE 360-4.14 MG/200ML-% IV SOLN
INTRAVENOUS | Status: AC
  Filled 2020-01-17: qty 200

## 2020-01-17 MED ORDER — CLOPIDOGREL BISULFATE 75 MG PO TABS
75.0000 mg | ORAL_TABLET | Freq: Every day | ORAL | Status: DC
  Administered 2020-01-17 – 2020-01-29 (×13): 75 mg via ORAL
  Filled 2020-01-17 (×13): qty 1

## 2020-01-17 MED ORDER — AMLODIPINE BESYLATE 5 MG PO TABS
5.0000 mg | ORAL_TABLET | Freq: Every day | ORAL | Status: DC
  Administered 2020-01-18 – 2020-01-24 (×7): 5 mg via ORAL
  Filled 2020-01-17 (×8): qty 1

## 2020-01-17 MED ORDER — DOCUSATE SODIUM 100 MG PO CAPS
100.0000 mg | ORAL_CAPSULE | Freq: Every day | ORAL | Status: DC
  Administered 2020-01-17 – 2020-01-29 (×13): 100 mg via ORAL
  Filled 2020-01-17 (×13): qty 1

## 2020-01-17 MED ORDER — AMIODARONE HCL IN DEXTROSE 360-4.14 MG/200ML-% IV SOLN
60.0000 mg/h | INTRAVENOUS | Status: AC
  Filled 2020-01-17: qty 200

## 2020-01-17 MED ORDER — POTASSIUM CHLORIDE CRYS ER 20 MEQ PO TBCR: 40.0000 meq | EXTENDED_RELEASE_TABLET | ORAL | Status: DC

## 2020-01-17 MED ORDER — POTASSIUM CHLORIDE CRYS ER 20 MEQ PO TBCR
40.0000 meq | EXTENDED_RELEASE_TABLET | Freq: Once | ORAL | Status: AC
  Administered 2020-01-17: 40 meq via ORAL
  Filled 2020-01-17: qty 2

## 2020-01-17 MED ORDER — MELATONIN 3 MG PO TABS
3.0000 mg | ORAL_TABLET | Freq: Every day | ORAL | Status: DC
  Administered 2020-01-17 – 2020-01-29 (×12): 3 mg via ORAL
  Filled 2020-01-17 (×12): qty 1

## 2020-01-17 MED ORDER — AMIODARONE HCL IN DEXTROSE 360-4.14 MG/200ML-% IV SOLN
30.0000 mg/h | INTRAVENOUS | Status: DC
  Administered 2020-01-17 – 2020-01-20 (×5): 30 mg/h via INTRAVENOUS
  Administered 2020-01-21: 60 mg/h via INTRAVENOUS
  Administered 2020-01-21: 30 mg/h via INTRAVENOUS
  Administered 2020-01-21 – 2020-01-22 (×6): 60 mg/h via INTRAVENOUS
  Administered 2020-01-23: 36 mg/h via INTRAVENOUS
  Administered 2020-01-23 – 2020-01-27 (×16): 60 mg/h via INTRAVENOUS
  Administered 2020-01-27: 30 mg/h via INTRAVENOUS
  Administered 2020-01-27: 60 mg/h via INTRAVENOUS
  Administered 2020-01-28 – 2020-01-30 (×4): 30 mg/h via INTRAVENOUS
  Filled 2020-01-17 (×39): qty 200

## 2020-01-17 MED ORDER — AMIODARONE LOAD VIA INFUSION
150.0000 mg | Freq: Once | INTRAVENOUS | Status: AC
  Administered 2020-01-17: 150 mg via INTRAVENOUS
  Filled 2020-01-17: qty 83.34

## 2020-01-17 NOTE — Progress Notes (Signed)
Progress Note  Patient Name: Daryl Rodriguez Date of Encounter: 01/16/2020  Primary Cardiologist: [name], MD   Patient Profile     86 y.o. male with a history of inferior STEMI with RCA PCI in May [id number] (complicated by recurrent VT leading to ICD placement).  Has moderately reduced EF by echo at that time with some improvement.  Was in his usual state of health doing well until the evening of 01/14/2020 when he started having profound dyspnea while cleaning his backyard shop.  He had to stop several times to catch his breath.  Denied any chest pain at that time, however he awoke this morning on 01/14/2020 with pressure heavy sensation that he said was a pain in his chest of 8 out of 10 associate with profound dyspnea.  Normal routine activities did not seem to help it.  He finally contacted EMS with significant dyspnea.  Upon arrival EMS he was in acute respiratory distress, lactate was over 2.  He was on BiPAP upon arrival to the ER was weaned down to nasal cannula oxygen.  Initial troponin VI 100 went to over 2000.  He was given 40 mg IV Lasix.  --> Taken for urgent cardiac catheterization revealing moderate to severe pulmonary venous hypertension and severe multivessel CAD.  IABP placed stabilization and diuresis.  Plan for possible staged PCI.   Principal Problem:   NSTEMI (non-ST elevated myocardial infarction) ([place]) Active Problems:   Acute pulmonary edema (HCC)   Acute respiratory failure with hypoxia (HCC)   Coronary artery disease involving native coronary artery of native heart with unstable angina pectoris (HCC)   Acute on chronic combined systolic and diastolic CHF (congestive heart failure) ([place])   History of ST elevation myocardial infarction involving right coronary artery ([place])   V tach (HCC)-chronic history of   Acute renal injury due to circulatory failure (HCC)   Dyslipidemia, goal LDL below 70   Essential hypertension   Type 2 diabetes mellitus with complication,  without long-term current use of insulin (HCC)   OSA (obstructive sleep apnea)   Cardiogenic shock (HCC)   Lactic acidosis   Subjective   Actually feels pretty good just a little bit bummed about the atrial fibrillation.  He is not having any more chest pain and is breathing well.  He is no longer on BiPAP.  He tried to use BiPAP overnight, but noticed that it was just too much force.  He simply used oxygen by nasal cannula.  (Plan tonight is to use his home CPAP with nasal cannula oxygen.)  His major concern is that he would likely barely get up and go to the bathroom.  Inpatient Medications    Scheduled Meds: . aspirin EC  81 mg Oral Daily  . atorvastatin  80 mg Oral q1800  . Chlorhexidine Gluconate Cloth  6 each Topical Daily  . Chlorhexidine Gluconate Cloth  6 each Topical Daily  . docusate sodium  100 mg Oral Daily  . gabapentin  300 mg Oral Daily  . gabapentin  600 mg Oral Daily  . insulin aspart  0-15 Units Subcutaneous Q4H  . iron polysaccharides  150 mg Oral Daily  . latanoprost  1 drop Both Eyes QHS  . mouth rinse  15 mL Mouth Rinse BID  . sodium chloride flush  10-40 mL Intracatheter Q12H  . sodium chloride flush  3 mL Intravenous Q12H  . vitamin B-12  500 mcg Oral Daily  . Vitamin D (Ergocalciferol)  50,000 Units Oral Q  Thu   Continuous Infusions: . sodium chloride    . sodium chloride    . sodium chloride    . sodium chloride Stopped (01/16/20 1752)  . amiodarone    . amiodarone 30 mg/hr (02/01/2020 1900)  . heparin 1,150 Units/hr (01/11/2020 1900)  . milrinone 0.125 mcg/kg/min (02/03/2020 1900)   PRN Meds: Place/Maintain arterial line **AND** sodium chloride, Place/Maintain arterial line **AND** sodium chloride, sodium chloride, sodium chloride, acetaminophen, nitroGLYCERIN, ondansetron (ZOFRAN) IV, sodium chloride flush, sodium chloride flush   Vital Signs    Vitals:   02/01/2020 1500 01/06/2020 1600 01/23/2020 1700 01/31/2020 1900  BP: (!) 135/43 (!) 137/53 (!)  139/53 (!) 135/52  Pulse:    (!) 145  Resp: (!) 28   20  Temp:      TempSrc:      SpO2: 95%   96%  Weight:      Height:        Intake/Output Summary (Last 24 hours) at 01/22/2020 1950 Last data filed at 01/10/2020 1900 Gross per 24 hour  Intake 1085.97 ml  Output 3785 ml  Net -2699.03 ml   Last 3 Weights 01/08/2020 01/16/2020 02/01/2020  Weight (lbs) 203 lb 14.8 oz 207 lb 0.2 oz 215 lb 9.8 oz  Weight (kg) 92.5 kg 93.9 kg 97.8 kg      Telemetry    A. fib RVR rates from 90 to 140 bpm.  Personally Reviewed  ECG    Atrial fibrillation 100; Non-specific intra-ventricular conduction delay Minimal voltage criteria for LVH, may be normal variant Nonspecific ST abnormality Abnormal ECG - Personally Reviewed  Physical Exam   Physical Exam  Constitutional: He is oriented to person, place, and time. He appears well-developed. No distress.  Resting in bed comfortably with BiPAP in place and IABP.  No obvious distress.  Otherwise healthy-appearing.  HENT:  Head: Normocephalic and atraumatic.  Neck: JVD (Difficult to assess, but seems elevated) present.  Cardiovascular: Normal rate and regular rhythm.  Pulmonary/Chest: No respiratory distress. He has no wheezes. He has no rales.  Tolerating BiPAP.  Mild basal rales, but otherwise clear  Musculoskeletal:        General: Edema (11 2+ lower extremity) present. Normal range of motion.     Cervical back: Normal range of motion and neck supple.  Neurological: He is alert and oriented to person, place, and time.  Psychiatric: He has a normal mood and affect. His behavior is normal. Judgment and thought content normal.  Vitals reviewed.    Labs    High Sensitivity Troponin:   Recent Labs  Lab 01/20/2020 1402 01/25/2020 1548  TROPONINIHS 603* 2,319*      Chemistry Recent Labs  Lab 01/23/2020 2351 02/03/2020 2351 01/16/20 0429 01/16/20 2141 01/16/2020 0411  NA 138   < > 140 137 140  K 3.5   < > 3.6 3.2* 3.7  CL 106   < > 106 104 104   CO2 21*  --  23  --  25  GLUCOSE 188*   < > 161* 124* 130*  BUN 25*   < > 24* 26* 25*  CREATININE 1.66*   < > 1.49* 1.40* 1.50*  CALCIUM 8.8*  --  8.7*  --  8.7*  GFRNONAA 38*  --  43*  --  42*  GFRAA 44*  --  50*  --  49*  ANIONGAP 11  --  11  --  11   < > = values in this interval not displayed.  Hematology Recent Labs  Lab 02/01/2020 2230 02/03/2020 2252 01/16/20 0429 01/16/20 2141 01/17/20 0411  WBC 10.7*  --  9.5  --  8.8  RBC 3.50*  --  3.26*  --  3.22*  HGB 11.6*   < > 10.9* 10.9* 10.7*  HCT 35.1*   < > 32.7* 32.0* 32.4*  MCV 100.3*  --  100.3*  --  100.6*  MCH 33.1  --  33.4  --  33.2  MCHC 33.0  --  33.3  --  33.0  RDW 14.8  --  14.8  --  14.6  PLT 175  --  172  --  149*   < > = values in this interval not displayed.    BNP Recent Labs  Lab 01/14/2020 1403  BNP 597.8*     DDimer No results for input(s): DDIMER in the last 168 hours.   Radiology    DG CHEST PORT 1 VIEW  Result Date: 01/28/2020 CLINICAL DATA:  Check central venous catheter placement EXAM: PORTABLE CHEST 1 VIEW COMPARISON:  Film from earlier in the same day. FINDINGS: Cardiac shadow remains enlarged. Pacing device is again seen. Intra-aortic balloon pump is noted just below the aortic knob new from the prior exam. Patchy parenchymal opacities are noted bilaterally likely related increasing edema. No sizable effusion is seen. No pneumothorax is noted. Right-sided PICC line is seen within the midportion of the right atrium. This could be withdrawn 2-3 cm. IMPRESSION: Intra-aortic balloon pump as described above. Right-sided PICC line deep in the right atrium which could be withdrawn 2-3 cm. Increasing parenchymal edema bilaterally when compared with the prior exam. Electronically Signed   By: Inez Catalina M.D.   On: 02/02/2020 21:27   ECHOCARDIOGRAM COMPLETE  Result Date: 01/16/2020    ECHOCARDIOGRAM REPORT   Patient Name:   JARETTE PULLUM Date of Exam: 01/16/2020 Medical Rec #:  [id number]       Height:       69.0 in Accession #:    [id number]     Weight:       207.0 lb Date of Birth:  1934-06-21      BSA:          2.096 m Patient Age:    57 years       BP:           135/46 mmHg Patient Gender: M              HR:           68 bpm. Exam Location:  Inpatient Procedure: 2D Echo Indications:    chest pain 786.50  History:        Patient has prior history of Echocardiogram examinations, most                 recent 03/22/2019. CHF, CAD, Defibrillator and balloon pump,                 Arrythmias:V-Tach; Risk Factors:Hypertension, Dyslipidemia and                 Diabetes.  Sonographer:    Johny Chess Referring Phys: [facility]  1. Left ventricular ejection fraction, by estimation, is 40 to 45%. The left ventricle has mildly decreased function. The left ventricle demonstrates regional wall motion abnormalities (see scoring diagram/findings for description). Left ventricular diastolic parameters are consistent with Grade II diastolic dysfunction (pseudonormalization). Elevated left ventricular end-diastolic pressure. There is hypokinesis of the left ventricular, mid-apical  anteroseptal wall, anterior wall and anterolateral wall.  2. Right ventricular systolic function is normal. The right ventricular size is normal.  3. Left atrial size was mild to moderately dilated.  4. Right atrial size was mildly dilated.  5. The mitral valve is normal in structure. Trivial mitral valve regurgitation. No evidence of mitral stenosis.  6. The aortic valve is grossly normal. Aortic valve regurgitation is not visualized. No aortic stenosis is present.  7. The inferior vena cava is dilated in size with >50% respiratory variability, suggesting right atrial pressure of 8 mmHg. Comparison(s): Changes from prior study are noted. EF and wall motion abnormalities worse as compared to prior study. FINDINGS  Left Ventricle: Left ventricular ejection fraction, by estimation, is 40 to 45%. The left ventricle has  mildly decreased function. The left ventricle demonstrates regional wall motion abnormalities. The left ventricular internal cavity size was normal in size. There is borderline left ventricular hypertrophy. Left ventricular diastolic parameters are consistent with Grade II diastolic dysfunction (pseudonormalization). Elevated left ventricular end-diastolic pressure. Right Ventricle: The right ventricular size is normal. No increase in right ventricular wall thickness. Right ventricular systolic function is normal. Left Atrium: Left atrial size was mild to moderately dilated. Right Atrium: Right atrial size was mildly dilated. Pericardium: Trivial pericardial effusion is present. Mitral Valve: The mitral valve is normal in structure. Trivial mitral valve regurgitation. No evidence of mitral valve stenosis. Tricuspid Valve: The tricuspid valve is normal in structure. Tricuspid valve regurgitation is trivial. No evidence of tricuspid stenosis. Aortic Valve: The aortic valve is grossly normal. Aortic valve regurgitation is not visualized. No aortic stenosis is present. Pulmonic Valve: The pulmonic valve was not well visualized. Pulmonic valve regurgitation is not visualized. No evidence of pulmonic stenosis. Aorta: The aortic root and ascending aorta are structurally normal, with no evidence of dilitation. Venous: The inferior vena cava is dilated in size with greater than 50% respiratory variability, suggesting right atrial pressure of 8 mmHg. IAS/Shunts: No atrial level shunt detected by color flow Doppler. Additional Comments: A pacer wire is visualized in the right atrium and right ventricle.  LEFT VENTRICLE PLAX 2D LVIDd:         5.70 cm      Diastology LVIDs:         4.50 cm      LV e' lateral:   8.38 cm/s LV PW:         1.40 cm      LV E/e' lateral: 11.9 LV IVS:        1.10 cm      LV e' medial:    5.98 cm/s LVOT diam:     2.10 cm      LV E/e' medial:  16.7 LV SV:         59 LV SV Index:   28 LVOT Area:     3.46  cm  LV Volumes (MOD) LV vol d, MOD A4C: 131.0 ml LV vol s, MOD A4C: 83.0 ml LV SV MOD A4C:     131.0 ml RIGHT VENTRICLE RV S prime:     15.60 cm/s TAPSE (M-mode): 2.5 cm LEFT ATRIUM           Index       RIGHT ATRIUM           Index LA diam:      4.10 cm 1.96 cm/m  RA Area:     16.60 cm LA Vol (A4C): 75.6 ml 36.06 ml/m RA Volume:  42.40 ml  20.22 ml/m  AORTIC VALVE LVOT Vmax:   107.00 cm/s LVOT Vmean:  63.100 cm/s LVOT VTI:    0.170 m  AORTA Ao Root diam: 3.60 cm MITRAL VALVE MV Area (PHT): 3.60 cm     SHUNTS MV Decel Time: 211 msec     Systemic VTI:  0.17 m MV E velocity: 100.00 cm/s  Systemic Diam: 2.10 cm MV A velocity: 82.60 cm/s MV E/A ratio:  1.21 Buford Dresser MD Electronically signed by Buford Dresser MD Signature Date/Time: 01/16/2020/10:47:38 AM    Final    [place] EKG SITE RITE  Result Date: 01/16/2020 If Site Rite image not attached, placement could not be confirmed due to current cardiac rhythm.   Cardiac Studies     01/29/2020 =Cardiac Cath:  PRE-OPERATIVE DIAGNOSIS:  NSTEMI, ACUTE ON CHRONIC MILD SYSTOLIC AND DIASTOLIC HEART FAILURE, acute respiratory failure with hypoxia  POST-OPERATIVE DIAGNOSIS:    Severe multivessel disease:  ? Ostial Left Main 30 to 40%,   severe diffuse proximal to mid LAD segmental lesions of 60% with an intervening shelflike lesion in segment of 95% (calcified).  Shortly downstream of this lesion there was another 85% stenosis; LCx as proximal 30 and 60% lesions just after the AV groove circumflex.  Then in the mid section there is a long segment of roughly 80% stenosis followed by a long lateral OM branch/distal LCx;  ? patent RCA stent with diffuse mild disease-bifurcates into RPDA with ostial and proximal 60% long lesion as well as a little larger posterior AV groove branch that gives off 1 small OM1 large posterolateral branch with 80% mid lesion.  SEVERE PULMONARY VENOUS HYPERTENSION consistent with ACUTE COMBINED SYSTOLIC AND  DIASTOLIC HEART FAILURE:  ? LV P-EDP 126/18 mmHg - 33 mmHg; AOP 125/58 mmHg-MAP 78 mmHg. ? RAP mean 13-15 mmHg; RVP-EDP 65/3 mmHg - 13 mmHg ? PAP-mean 73/23 mmHg - 42 mmHg; PCWP 46/62 (large V wave)-mean 39 mmHg    01/16/2020 = 2D Echo:   Mildly reduced EF 40 and 45%-hypokinesis of mid apical anteroseptal anterior and anterolateral wall.  Mild to moderate LA dilation.  Mild LA dilation.  Dilated IVC with respiratory variation suggesting elevated RAP/CVP.Marland Kitchen GRII DD.  When compared to July 2020-EF was 50 to 55% with GRII DD.  No obvious wall motion normality noted.   Assessment & Plan    Principal Problem:    NSTEMI (non-ST elevated myocardial infarction) ([place]); Coronary artery disease involving native coronary artery of native heart with unstable angina pectoris (HCC)  Patent RCA stent with mid to distal PL disease but major complicating issue is questionable left main stenosis with severe diffuse LAD disease and circumflex disease.  Continue on aspirin -- Plavix for today since we are going to obtain CV surgical consultation. -->  Unfortunately, not considered to be a good surgical candidate.  Restart Plavix  IABP placed to stabilize -> we started on IV heparin now on treatment dose drip --> if remains hemodynamically stable over the weekend, would probably consider removing the IABP to allow her to ambulate. .  Would probably end up considering using Impella for supported PCI on Tuesday based on his recent heart failure and somewhat reduced EF along with significant circumflex disease and possible left main involvement.  Will need to do IVUS of left main prior to starting PCI of the LAD.-He may require atherectomy and stenting of the left main. ->  Circumflex with hopefully be elevated on the same day, but may be rescheduled for  another day without protection.  Active Problems:     Acute respiratory failure with hypoxia (HCC) with  Acute pulmonary edema (HCC) from Acute on chronic  combined systolic and diastolic CHF (congestive heart failure) (HCC)   Weaned off of BiPAP.  Now on nasal cannula oxygen.  Using CPAP at night with oxygen for his OSA.  IABP placed in Cath Lab to allow for offloading of the ventricle -> will defer to the heart failure team over the weekend, I expect that he may very well be able to have the balloon pump removed to allow him to ambulate this weekend if he is stable.  I appreciate heart failure team helping out with management of diuresis and hemodynamics.  Currently not on any diuretic  He is not currently on beta-blocker because of amiodarone   New onset atrial fibrillation-> went into atrial fibrillation last night.  There was question about anticoagulation timing.  He has been on heparin since admission.  Has apparently had history of A. fib in the past, but not recently.  Was not currently on a DOAC.  He has been on amiodarone and therefore probably has not any breakthrough spells.  Plan: Amiodarone bolus and drip.  Was given a second bolus by the heart failure team and is now converted to sinus rhythm.  Would continue IV amiodarone over the night to complete IV load and then switch back to oral maintenance.  Acute renal injury due to circulatory failure (HCC)  Creatinine currently 1.5.  Continue to monitor.  Is in about the timeframe for contrast nephropathy.    Dyslipidemia, goal LDL below 70  Has been on 80 mg atorvastatin at home.  Will restart.    Essential hypertension  Blood pressure is well preserved.  Holding home dose amlodipine and metoprolol.  Anticipate restarting amlodipine tomorrow    Type 2 diabetes mellitus with complication, without long-term current use of insulin ([place])  Was on oral Amaryl at home.  Continue sliding scale    History of ST elevation myocardial infarction involving right coronary artery (HCC) -RCA stent widely patent.    V tach (HCC)-chronic history of: No recurrence on telemetry.  On  amiodarone maintenance dose.  ICD in place.   CRITICAL CARE Performed by: Glenetta Hew   Total critical care time: 45 minutes Patient is critically ill with multiorgan system involvement. Critical care time was exclusive of separately billable procedures and treating other patients.  Critical care was necessary to treat or prevent imminent or life-threatening deterioration.  Critical care was time spent personally by me on the following activities: development of treatment plan with patient and/or surrogate as well as nursing, discussions with consultants, evaluation of patient's response to treatment, examination of patient, obtaining history from patient or surrogate, ordering and performing treatments and interventions, ordering and review of laboratory studies, ordering and review of radiographic studies, pulse oximetry and re-evaluation of patient's condition.   For questions or updates, please contact [facility] Please consult [email or web address] for contact info under        Signed, Glenetta Hew, MD  01/16/2020, 7:50 PM

## 2020-01-17 NOTE — Progress Notes (Signed)
[facility] [address] [address]       [place] [address]             [phone]          CARDIOTHORACIC SURGERY CONSULTATION REPORT  PCP is Renaldo Reel, PA Referring Provider is Glenetta Hew, MD Primary Cardiologist is [name], MD  Reason for consultation:  Coronary artery disease  HPI:  Patient is an 86 year old male with known history of multivessel coronary artery disease status post acute ST segment elevation myocardial infarction in May 2019 treated with PCI and stenting of the right coronary artery, ischemic cardiomyopathy with chronic systolic congestive heart failure, ventricular tachycardia status post ICD placement, hypertension, hyperlipidemia, type 2 diabetes mellitus, obstructive sleep apnea on CPAP, and chronic kidney disease who was admitted to the hospital with acute exacerbation of chronic systolic congestive heart failure and cardiogenic shock and now has been referred for surgical consultation to discuss treatment options for management of multivessel coronary artery disease.  Patient's cardiac history dates back to May 2019 when he was admitted with an acute inferior wall ST segment elevation myocardial infarction.  He underwent emergent cardiac catheterization with PCI and stenting of right coronary artery using drug-eluting stent.  He was noted to have severe three-vessel coronary artery disease at that time.  His recovery was complicated by acute hypoxemic respiratory failure, atrial fibrillation with rapid ventricular response, wide-complex tachycardia consistent with recurrent ventricular tachycardia, and acute exacerbation of chronic kidney disease.  He ultimately underwent ICD placement for unstable VT with syncope and collapse while he remained in the hospital.  He ultimately recovered and has been followed regularly ever since by Dr. Agustin Cree.  He has complained of chronic symptoms of exertional shortness of breath and tightness.  Echocardiogram  performed July 2020 revealed ejection fraction 50 to 55%.  His defibrillator has never fired.  Patient describes a gradual decline in his functional status with progressive generalized weakness and exertional shortness of breath.  The symptoms accelerated fairly quickly over the lastseveral weeks and recently the patient developed some chest tightness.  He was admitted to the hospital acutely Jan 15, 2020 with resting shortness of breath associated with acute hypoxemic respiratory failure and some tightness across his chest.  EKG was without ST segment elevation.  Oxygen saturation were in the 80s on arrival with BNP 597.  Chest x-ray revealed mild pulmonary vascular congestion.  High-sensitivity troponin level went from 600 to 2000.  He was taken to the Cath Lab where catheterization revealed severe multivessel coronary artery disease with elevated filling pressures and low cardiac output.  Intra-aortic balloon pump was placed and milrinone infusion was begun.  The patient was diuresed with IV Lasix and he improved.  The patient was scheduled for planned multivessel PCI and stenting earlier today but went into atrial fibrillation this morning.  Intervention was canceled and cardiothoracic surgical consultation requested.  Patient is married and lives with his wife and [place].  His daughter is at the bedside for his hospital consultation visit this afternoon.  He has been retired for more than 30 years.  He has remained reasonably active physically until the last year or 2 when he has slowed down considerably.  He admits to gradual decline in generalized weakness and exertional shortness of breath.  He states that he does not walk very much anymore because of his generalized weakness.  Prior to admission he got short of breath with low-level activity and recently he had resting shortness  of breath.  He has chronic lower extremity edema.  Prior to admission he walked without need for mechanical support.  He  has not had any chest pain or chest tightness since hospital admission.  Shortness of breath is reportedly improved.  He wants to get up and out of bed.   Past Medical History:  Diagnosis Date  . Acute respiratory failure ([place])   . BPH (benign prostatic hyperplasia)   . Cardiac arrest ([place])   . Carpal tunnel syndrome   . CKD (chronic kidney disease)   . Diabetes ([place])   . DM II (diabetes mellitus, type II), controlled ([place])   . High risk medication use   . Hyperlipidemia   . Hypertension   . Low vitamin D level   . STEMI (ST elevation myocardial infarction) ([place])    01/22/18 PCI/DES to RCA  . Systolic heart failure ([place])   . VT (ventricular tachycardia) ([place])     Past Surgical History:  Procedure Laterality Date  . BACK SURGERY    . CORONARY STENT INTERVENTION N/A 01/22/2018   Procedure: CORONARY STENT INTERVENTION;  Surgeon: Troy Sine, MD;  Location: [facility];  Service: Cardiovascular;  Laterality: N/A;  . CORONARY/GRAFT ACUTE MI REVASCULARIZATION N/A 01/22/2018   Procedure: Coronary/Graft Acute MI Revascularization;  Surgeon: Troy Sine, MD;  Location: [facility];  Service: Cardiovascular;  Laterality: N/A;  . IABP INSERTION N/A 01/13/2020   Procedure: IABP Insertion;  Surgeon: Leonie Man, MD;  Location: [facility];  Service: Cardiovascular;  Laterality: N/A;  . ICD IMPLANT N/A 01/30/2018   Procedure: ICD IMPLANT;  Surgeon: Evans Lance, MD;  Location: [facility];  Service: Cardiovascular;  Laterality: N/A;  . LEFT HEART CATH AND CORONARY ANGIOGRAPHY N/A 01/22/2018   Procedure: LEFT HEART CATH AND CORONARY ANGIOGRAPHY;  Surgeon: Troy Sine, MD;  Location: [facility];  Service: Cardiovascular;  Laterality: N/A;  . LEFT HEART CATH AND CORONARY ANGIOGRAPHY N/A 01/25/2018   Procedure: LEFT HEART CATH AND CORONARY ANGIOGRAPHY - RELOOK;  Surgeon: [place] Hampshire, MD;  Location: [facility];  Service: Cardiovascular;   Laterality: N/A;  . RIGHT/LEFT HEART CATH AND CORONARY ANGIOGRAPHY N/A 01/25/2020   Procedure: RIGHT/LEFT HEART CATH AND CORONARY ANGIOGRAPHY;  Surgeon: Leonie Man, MD;  Location: [facility];  Service: Cardiovascular;  Laterality: N/A;  . WRIST SURGERY      Family History  Problem Relation Age of Onset  . Heart attack Father 61  . Heart attack Mother   . Stroke Sister     Social History   Socioeconomic History  . Marital status: Married    Spouse name: Not on file  . Number of children: Not on file  . Years of education: Not on file  . Highest education level: Not on file  Occupational History  . Not on file  Tobacco Use  . Smoking status: Former Research scientist (life sciences)  . Smokeless tobacco: Never Used  . Tobacco comment: quit smoking 50 years ago  Substance and Sexual Activity  . Alcohol use: Not Currently  . Drug use: Not Currently  . Sexual activity: Not on file  Other Topics Concern  . Not on file  Social History Narrative  . Not on file   Social Determinants of Health   Financial Resource Strain:   . Difficulty of Paying Living Expenses:   Food Insecurity:   . Worried About Charity fundraiser in the  Last Year:   . [place] in the Last Year:   Transportation Needs:   . Film/video editor (Medical):   Marland Kitchen Lack of Transportation (Non-Medical):   Physical Activity:   . Days of Exercise per Week:   . Minutes of Exercise per Session:   Stress:   . Feeling of Stress :   Social Connections:   . Frequency of Communication with Friends and Family:   . Frequency of Social Gatherings with Friends and Family:   . Attends Religious Services:   . Active Member of Clubs or Organizations:   . Attends Archivist Meetings:   Marland Kitchen Marital Status:   Intimate Partner Violence:   . Fear of Current or Ex-Partner:   . Emotionally Abused:   Marland Kitchen Physically Abused:   . Sexually Abused:     Prior to Admission medications   Medication Sig Start Date End Date  Taking? Authorizing Provider  amiodarone (PACERONE) 200 MG tablet Take one tablet by mouth daily Monday through Saturday.  Do NOT take on Sunday. Patient taking differently: Take 200 mg by mouth See admin instructions. Take 200 mg by mouth in the morning on Mon/Tues/Wed/Thurs/Fri/Sat and nothing on Sunday 03/04/19  Yes Evans Lance, MD  amLODipine (NORVASC) 10 MG tablet Take 10 mg by mouth daily.  09/12/19  Yes [provider]  ammonium lactate (LAC-HYDRIN) 12 % lotion Apply 1 application topically 2 (two) times daily as needed (to affected areas of legs and hands).  12/19/17  Yes [provider]  aspirin EC 81 MG tablet Take 81 mg by mouth daily.   Yes [provider]  atorvastatin (LIPITOR) 80 MG tablet TAKE 1 TABLET BY MOUTH ONCE DAILY AT  6  PM Patient taking differently: Take 80 mg by mouth See admin instructions. Take 80 mg by mouth at 5 PM daily 07/16/18  Yes Park Liter, MD  clopidogrel (PLAVIX) 75 MG tablet Take 1 tablet by mouth once daily Patient taking differently: Take 75 mg by mouth daily.  10/30/19  Yes Park Liter, MD  FERREX 150 150 MG capsule TAKE 1 CAPSULE BY MOUTH ONCE DAILY Patient taking differently: Take 150 mg by mouth daily.  03/21/18  Yes Cheryln Manly, NP  furosemide (LASIX) 40 MG tablet Take 1 tablet (40 mg total) by mouth daily. Patient taking differently: Take 40 mg by mouth See admin instructions. Take 40 mg by mouth in the morning and 40 mg at 1:30 PM 05/09/18  Yes Evans Lance, MD  gabapentin (NEURONTIN) 300 MG capsule Take 300-600 mg by mouth See admin instructions. Take 300 mg by mouth in the morning and 600 mg at 5 PM 12/26/17  Yes [provider]  glimepiride (AMARYL) 2 MG tablet Take 1 mg by mouth daily with breakfast.    Yes [provider]  isosorbide mononitrate (IMDUR) 30 MG 24 hr tablet Take 1 tablet by mouth once daily Patient taking differently: Take 30 mg by mouth in the morning.  01/02/19   Yes Park Liter, MD  latanoprost (XALATAN) 0.005 % ophthalmic solution Place 1 drop into both eyes at bedtime.   Yes [provider]  losartan (COZAAR) 100 MG tablet Take 1 tablet (100 mg total) by mouth daily. Patient taking differently: Take 100 mg by mouth See admin instructions. Take 100 mg by mouth at 5 PM daily 03/04/19 06/04/20 Yes Evans Lance, MD  metoprolol succinate (TOPROL-XL) 50 MG 24 hr tablet Take 1  tablet (50 mg total) by mouth daily. Take with or immediately following a meal. 03/04/19 06/04/20 Yes Evans Lance, MD  nitroGLYCERIN (NITROSTAT) 0.4 MG SL tablet Place 1 tablet (0.4 mg total) under the tongue every 5 (five) minutes x 3 doses as needed for chest pain. 02/01/18  Yes Reino Bellis B, NP  potassium chloride SA (KLOR-CON M20) 20 MEQ tablet Take 1 tablet (20 mEq total) by mouth daily. 02/12/18 01/04/2020 Yes Lendon Colonel, NP  terazosin (HYTRIN) 1 MG capsule Take 1 mg by mouth See admin instructions. Take 1 mg by mouth at 5 PM daily   Yes [provider]  triamcinolone cream (KENALOG) 0.1 % Apply 1 application topically as needed (to itching areas of skin).  12/05/17  Yes [provider]  vitamin B-12 (CYANOCOBALAMIN) 500 MCG tablet Take 500 mcg by mouth daily.   Yes [provider]  Vitamin D, Ergocalciferol, (DRISDOL) 50000 units CAPS capsule Take 50,000 Units by mouth every Thursday.    Yes [provider]    Current Facility-Administered Medications  Medication Dose Route Frequency Provider Last Rate Last Admin  . 0.9 %  sodium chloride infusion   Intra-arterial PRN Leonie Man, MD      . 0.9 %  sodium chloride infusion   Intra-arterial PRN Leonie Man, MD      . 0.9 %  sodium chloride infusion  250 mL Intravenous PRN Leonie Man, MD      . 0.9 %  sodium chloride infusion   Intravenous PRN Leonie Man, MD   Stopped at 01/16/20 1752  . acetaminophen (TYLENOL) tablet 650 mg  650 mg Oral Q4H PRN  Leonie Man, MD      . amiodarone (NEXTERONE PREMIX) 360-4.14 MG/200ML-% (1.8 mg/mL) IV infusion           . amiodarone (NEXTERONE PREMIX) 360-4.14 MG/200ML-% (1.8 mg/mL) IV infusion  30 mg/hr Intravenous Continuous Leonie Man, MD 16.67 mL/hr at 02/01/2020 1600 30 mg/hr at 01/06/2020 1600  . aspirin EC tablet 81 mg  81 mg Oral Daily Clegg, Amy D, NP   81 mg at 02/03/2020 1022  . atorvastatin (LIPITOR) tablet 80 mg  80 mg Oral q1800 Leonie Man, MD   80 mg at 01/21/2020 1709  . Chlorhexidine Gluconate Cloth 2 % PADS 6 each  6 each Topical Daily Leonie Man, MD   6 each at 01/25/2020 2100  . Chlorhexidine Gluconate Cloth 2 % PADS 6 each  6 each Topical Daily Leonie Man, MD      . docusate sodium (COLACE) capsule 100 mg  100 mg Oral Daily Leonie Man, MD   100 mg at 01/26/2020 1022  . gabapentin (NEURONTIN) capsule 300 mg  300 mg Oral Daily Leonie Man, MD   300 mg at 01/22/2020 1023  . gabapentin (NEURONTIN) capsule 600 mg  600 mg Oral Daily Blenda Nicely, RPH   600 mg at 01/14/2020 1708  . heparin ADULT infusion 100 units/mL (25000 units/265m sodium chloride 0.45%)  1,150 Units/hr Intravenous Continuous BEinar Grad RPH 11.5 mL/hr at 01/21/2020 1600 1,150 Units/hr at 01/08/2020 1600  . insulin aspart (novoLOG) injection 0-15 Units  0-15 Units Subcutaneous Q4H Nipp, Carriel T, MD   2 Units at 01/10/2020 1708  . iron polysaccharides (NIFEREX) capsule 150 mg  150 mg Oral Daily HLeonie Man MD   150 mg at 01/04/2020 1023  . latanoprost (XALATAN) 0.005 % ophthalmic solution 1  drop  1 drop Both Eyes QHS Leonie Man, MD   1 drop at 01/16/20 2330  . MEDLINE mouth rinse  15 mL Mouth Rinse BID Leonie Man, MD   15 mL at 01/06/2020 1031  . milrinone (PRIMACOR) 20 MG/100 ML (0.2 mg/mL) infusion  0.125 mcg/kg/min Intravenous Continuous Milus Banister, MD 3.61 mL/hr at 02/03/2020 1600 0.125 mcg/kg/min at 01/09/2020 1600  . nitroGLYCERIN (NITROSTAT) SL tablet 0.4 mg  0.4 mg  Sublingual Q5 Min x 3 PRN Leonie Man, MD   0.4 mg at 01/24/2020 1021  . ondansetron (ZOFRAN) injection 4 mg  4 mg Intravenous Q6H PRN Leonie Man, MD      . sodium chloride flush (NS) 0.9 % injection 10-40 mL  10-40 mL Intracatheter Q12H Leonie Man, MD      . sodium chloride flush (NS) 0.9 % injection 10-40 mL  10-40 mL Intracatheter PRN Leonie Man, MD      . sodium chloride flush (NS) 0.9 % injection 3 mL  3 mL Intravenous Q12H Leonie Man, MD   3 mL at 01/28/2020 1033  . sodium chloride flush (NS) 0.9 % injection 3 mL  3 mL Intravenous PRN Leonie Man, MD      . vitamin B-12 (CYANOCOBALAMIN) tablet 500 mcg  500 mcg Oral Daily Leonie Man, MD   500 mcg at 01/04/2020 1023  . Vitamin D (Ergocalciferol) (DRISDOL) capsule 50,000 Units  50,000 Units Oral Q Dawayne Cirri, MD   50,000 Units at 01/16/20 1001    No Known Allergies    Review of Systems:  Per HPI Remainder non-contributory     Physical Exam:   BP (!) 137/53   Pulse 80   Temp 98.2 F (36.8 C) (Oral)   Resp (!) 28   Ht 5' 9"  (1.753 m)   Wt 92.5 kg   SpO2 95%   BMI 30.11 kg/m   General:  Elderly male in NAD  HEENT:  Unremarkable   Neck:   no JVD, no bruits, no adenopathy   Chest:   symmetrical breath sounds that are diminished at bases, no wheezes, no rhonchi   CV:   RRR, no murmur   Abdomen:  soft, non-tender, no masses   Extremities:  warm, well-perfused, pulses diminished, Mild lower extremity edema  Rectal/GU  Deferred  Neuro:   Grossly non-focal and symmetrical throughout  Skin:   Thin and frail but clean and dry, no rashes, no breakdown  Diagnostic Tests:  Lab Results: Recent Labs    01/16/20 0429 01/16/20 0429 01/16/20 2141 01/24/2020 0411  WBC 9.5  --   --  8.8  HGB 10.9*   < > 10.9* 10.7*  HCT 32.7*   < > 32.0* 32.4*  PLT 172  --   --  149*   < > = values in this interval not displayed.   BMET:  Recent Labs    01/16/20 0429 01/16/20 0429 01/16/20 2141  01/06/2020 0411  NA 140   < > 137 140  K 3.6   < > 3.2* 3.7  CL 106   < > 104 104  CO2 23  --   --  25  GLUCOSE 161*   < > 124* 130*  BUN 24*   < > 26* 25*  CREATININE 1.49*   < > 1.40* 1.50*  CALCIUM 8.7*  --   --  8.7*   < > = values in this interval not  displayed.    CBG (last 3)  Recent Labs    01/06/2020 0401 01/05/2020 0753 01/28/2020 1201  GLUCAP 123* 110* 148*   PT/INR:   Recent Labs    01/05/2020 2230  LABPROT 14.7  INR 1.2    CXR:  PORTABLE CHEST 1 VIEW  COMPARISON:  Film from earlier in the same day.  FINDINGS: Cardiac shadow remains enlarged. Pacing device is again seen. Intra-aortic balloon pump is noted just below the aortic knob new from the prior exam. Patchy parenchymal opacities are noted bilaterally likely related increasing edema. No sizable effusion is seen. No pneumothorax is noted. Right-sided PICC line is seen within the midportion of the right atrium. This could be withdrawn 2-3 cm.  IMPRESSION: Intra-aortic balloon pump as described above.  Right-sided PICC line deep in the right atrium which could be withdrawn 2-3 cm.  Increasing parenchymal edema bilaterally when compared with the prior exam.   Electronically Signed   By: Inez Catalina M.D.   On: 01/14/2020 21:27    RIGHT/LEFT HEART CATH AND CORONARY ANGIOGRAPHY         Conclusion         .Extensive mid-distal LAD disease: Mid LAD-1 lesion is 60% stenosed. Mid LAD-2 lesion is 95% stenosed -eccentric shelflike calcified lesion. Mid LAD-3 lesion is 45% stenosed with 70% stenosed side branch in 3rd Diag. Dist LAD lesion is 80% stenosed.   Colon Flattery Cx to Prox Cx lesion is 30% stenosed.   .Prox Cx lesion is 60% stenosed. Prox Cx to Mid Cx lesion is 80% stenosed.   .Prox RCA to Mid RCA previously placed stent is 5% stenosed.   .Mid RCA lesion is 40% stenosed.   Marland KitchenRPDA lesion is 60% stenosed.   .2nd RPL lesion is 80% stenosed.   .LV end diastolic pressure is  severely elevated.   .There is no aortic valve stenosis.   .Hemodynamic findings consistent with moderate pulmonary hypertension.   .Mid LAD-2 lesion is 95% stenosed.   .Prox Cx to Mid Cx lesion is 80% stenosed.      SUMMARY  .Severe multivessel disease:    ?Ostial Left Main 30 to 40%,    ?severe diffuse proximal to mid LAD segmental lesions of 60% with an intervening shelflike lesion in segment of 95% (calcified).  Shortly downstream of this lesion there was another 80% stenosis; LCx as proximal 30 and 60% lesions just after the AV groove circumflex.  Then in the mid section there is a long segment of roughly 80% stenosis followed by a long lateral OM branch/distal LCx;    ?patent RCA stent with diffuse mild disease-bifurcates into RPDA with ostial and proximal 60% long lesion as well as a little larger posterior AV groove branch that gives off 1 small OM1 large posterolateral branch with 80% mid lesion.   Marland KitchenSEVERE PULMONARY VENOUS HYPERTENSION consistent with ACUTE COMBINED SYSTOLIC AND DIASTOLIC HEART FAILURE:    ?LV P-EDP 126/18 mmHg - 33 mmHg; AOP 125/58 mmHg-MAP 78 mmHg.   ?RAP mean 13-15 mmHg; RVP-EDP 65/3 mmHg - 13 mmHg   ?PAP-mean 73/23 mmHg - 42 mmHg; PCWP 46/62 (large V wave)-mean 39 mmHg         RECOMMENDATIONS  .Patient has severe two-vessel disease with the most prominent lesion being in the mid LAD.  Given the extent of CHF with severely elevated LVEDP and PCWP, I discussed with Dr. Burt Knack and we agreed that the best course of action will be to stabilize the patient with IABP to allow for  diuresis.  The case is been monitored by Dr. Haroldine Laws who recommended IV milrinone infusion.   Marland KitchenHe was given IV Lasix in the Cath Lab we will monitor his urine output overnight.  Of note, he did have slightly elevated creatinine from his baseline likely related to congestive nephropathy-we will need to closely monitor renal function.   Marland KitchenHe is already on Plavix.   Will restart IV heparin at lower dose for IVP until 6 hours post sheath removal and then will go back to full dose.   .Plan would be to stage PCI of the LAD at least which would be a very long lesion covering from the 60% to the 80% stenoses.  If successful, would then consider addressing the LCx.  We would need to would make the decision about timing for this procedure based on how stable the patient is overnight and how well he is diuresed.         Glenetta Hew, M.D., M.S.  Interventional Cardiologist   untitled image  Pager # [phone]  Phone # [phone]  [address] [place], [place] [address]               Recommendations   Antiplatelet/Anticoag Recommend uninterrupted dual antiplatelet therapy with Aspirin 32m daily and Clopidogrel 71mdaily for a minimum of 12 months (ACS-Class I recommendation). Patient is already on aspirin and Plavix.  With likely planned extensive PCI of the LAD and probably circumflex in a staged manner, would continue lifelong Plavix.   Discharge Date Anticipated discharge date to be determined. The patient was acutely ill with ACUTE HYPOXIC RESPIRATORY FAILURE secondary to ACUTE ON CHRONIC COMBINED SYSTOLIC AND DIASTOLIC HEART FAILURE with NON-ST ELEVATION MI. With extensive LAD and circumflex disease I felt it prudent to stabilize the patient with severe pulmonary venous congestion and pulmonary edema requiring BiPAP prior to PCI. IABP placed with good augmentation. Patient was initiated on milrinone drip along with nitroglycerin.  Plan will be staged PCI on May 13 or 14 depending on stability.        Surgeon Notes       01/13/2020  8:38 PM Brief Op Note signed by HaLeonie ManMD        Indications   Non-STEMI (non-ST elevated myocardial infarction) ([facility][I21.4 (ICD-10-CM)]  Acute respiratory failure with hypoxia ([facility][J96.01 (ICD-10-CM)]  Acute on chronic combined systolic and diastolic CHF  (congestive heart failure) ([facility][I50.43 (ICD-10-CM)]  Coronary artery disease involving native coronary artery of native heart with unstable angina pectoris ([facility][I25.110 (ICD-10-CM)]        Procedural Details   Technical Details Primary care provider: KaIhor DowPA Cardiologist: Dr. KrRadonna [id number].o. male with a history of inferior STEMI with RCA PCI in May 206629complicated by recurrent VT leading to ICD placement).  Has moderately reduced EF by echo at that time with some improvement.  Was in his usual state of health doing well until the evening of 01/14/2020 when he started having profound dyspnea while cleaning his backyard shop.  He had to stop several times to catch his breath.  Denied any chest pain at that time, however he awoke this morning on 01/25/2020 with pressure heavy sensation that he said was a pain in his chest of 8 out of 10 associate with profound dyspnea.  Normal routine activities did not seem to help it.  He finally contacted EMS with significant dyspnea.  Upon arrival EMS he was in acute respiratory  distress, lactate was over 2.  He was on NONREBREATHER MASK upon arrival to the ER was weaned down to nasal cannula oxygen.  Initial troponin VI 100 went to over 2000.  He was given 40 mg IV Lasix.  Cardiology was then consulted at roughly 4:30 PM and he was seen at roughly 5 by myself and Sande Rives, [place].  He was continued note tightness across his chest was quite dyspneic.  Oxygen saturations in the high 80s on high flow oxygen.  On my clinical judgment it was that he was in likely for pulmonary edema and with ongoing chest tightness and dyspnea/hypoxia I felt it prudent to proceed with cardiac catheterization with urgent non-STEMI and ACUTE ON CHRONIC COMBINED SYSTOLIC AND DIASTOLIC HEART FAILURE resulting in ACUTE RESPIRATORY FAILURE WITH HYPOXIA.  Time Out: Verified patient identification, verified procedure, site/side was marked, verified correct  patient position, special equipment/implants available, medications/allergies/relevent history reviewed, required imaging and test results available. Performed.  Access:  Right radial Artery: 6 Fr sheath -- Seldinger technique using Micropuncture Kit Direct ultrasound guidance used.  Permanent image obtained and placed on chart. 10 mL radial cocktail IA; 4000 units IV Heparin  *Right Common Femoral Artery: 8 Fr IABP sheath - fluoroscopically guided modified Seldinger Technique, 5 Fr micropuncture kit upsized to 6 Fr sheath then IABP sheath Direct ultrasound guidance used.  Permanent image obtained and placed on chart.  * Right Brachial/Antecubital Vein: An ultrasound-guided 18-gauge IV inserted in the right brachial vein by Cath Lab staff was exchanged over a wire for a 5Fr  sheath  Right Heart Catheterization: 5 Fr Gordy Councilman catheter advanced under fluoroscopy with balloon inflated to the RA, RV, then PCWP-PA for hemodynamic measurement.  * Simultaneous FA & PA blood gases checked for SaO2% to calculate FICK CO/CI (there is difficult to traverse the mid brachial vein, therefore a 0.25 Glidewire was used to assist in advancement into the heart. *The catheter was left in place, sheath sutured in the entire residual catheter sterilely dressed.  Balloon inflation syringe and stopcock port left uncovered.  Left Heart Catheterization: 5 Fr Catheters advanced or exchanged over a J-wire under direct fluoroscopic guidance into the ascending aorta; TIG 4.0 catheter advanced first.  * LV Hemodynamics (LV Gram): TIG 4.0 catheter * Left & Right Coronary Artery Cineangiography: TIG 4.0 catheter  *Abdominal iliac aortogram: Take 4.0 catheter redirected into the descending aorta and exchanged for angled pigtail catheter.  Hand-injection performed just to confirm absence of any significant aortoiliac disease.  Upon completion of Angiogaphy, the catheter was removed completely out of the body over a wire,  without complication.  Femoral Arterial and Brachial Venous sheath(s) sutured in place and sterilely dressed   Radial sheath removed in the Cardiac Catheterization lab with TR Band placed for hemostasis.  TR Band: 1950 Hours; 12 mL air  MEDICATIONS * SQ Lidocaine 63m * Radial Cocktail: 3 mg Verapmil in 10 mL NS * Isovue Contrast: 70 mL * 80 mg IV Lasix * Heparin: Total 7000 units * IV milrinone (PRIMACOR) 20 MG/100 ML (0.2 mg/mL) infusion 0.25 mcg/kg/min   * IV NTG infusion initially discontinued then restarted at 5 mcg/min Estimated blood loss <50 mL.   During this procedure medications were administered to achieve and maintain moderate conscious sedation while the patient's heart rate, blood pressure, and oxygen saturation were continuously monitored and I was present face-to-face 100% of this time.        Medications   (Filter: Administrations occurring from 01/12/2020 1819  to 01/18/2020 2028)      Continuous medications are totaled by the amount administered until 01/14/2020 2028.      lidocaine (PF) (XYLOCAINE) 1 % injection (mL) Total volume:  17 mL      Date/Time     Rate/Dose/Volume  Action  01/21/2020 1839  2 mL Given  1912  15 mL Given        Radial Cocktail/Verapamil only (mL) Total volume:  5 mL      Date/Time     Rate/Dose/Volume  Action  01/11/2020 1842  5 mL Given        furosemide (LASIX) injection (mg) Total dose:  80 mg      Date/Time     Rate/Dose/Volume  Action  01/14/2020 1850  80 mg Given        heparin sodium (porcine) injection (Units) Total dose:  8,000 Units      Date/Time     Rate/Dose/Volume  Action  01/28/2020 1852  4,000 Units Given  2010  4,000 Units Given        milrinone (PRIMACOR) 20 MG/100 ML (0.2 mg/mL) infusion (mcg/kg/min) Total dose:  Cannot be calculated* Dosing weight:  96.2    *Continuous medication not stopped within the calculation time  range.    Date/Time     Rate/Dose/Volume  Action  01/16/2020 1939  0.25 mcg/kg/min - 7.22 mL/hr New Bag/Given        iohexol (OMNIPAQUE) 350 MG/ML injection (mL) Total volume:  70 mL      Date/Time     Rate/Dose/Volume  Action  02/02/2020 2009  70 mL Given        Heparin (Porcine) in NaCl 1000-0.9 UT/500ML-% SOLN (mL) Total volume:  1,000 mL      Date/Time     Rate/Dose/Volume  Action  01/31/2020 2010  500 mL Given  2010  500 mL Given        0.9 %  sodium chloride infusion (mL/hr) Total dose:  Cannot be calculated* Dosing weight:  96.2    *Continuous medication not stopped within the calculation time range.    Date/Time     Rate/Dose/Volume  Action  01/12/2020 2020  10 mL/hr New Bag/Given        nitroGLYCERIN 50 mg in dextrose 5 % 250 mL (0.2 mg/mL) infusion (mcg/min) Total dose:  319.58 mcg Dosing weight:  96.2      Date/Time     Rate/Dose/Volume  Action  01/07/2020 1819  *Not included in total MAR Hold  1902   Stopped  1925  5 mcg/min - 1.5 mL/hr Restarted              Contrast       Medication Name   Total Dose    iohexol (OMNIPAQUE) 350 MG/ML injection   70 mL           Radiation/Fluoro      Fluoro time: 9 (min)  DAP: 27.5 (Gycm2)  Cumulative Air Kerma: 562.6 (mGy)         Complications      Complications documented before study signed (01/16/2020 12:46 AM)       No complications were associated with this study.  Documented by Leonie Man, MD - 01/16/2020 12:20 AM          Coronary Findings     Diagnostic Dominance: Right   Left Main  Vessel is large.   Left Anterior Descending  Mid LAD-1 lesion 60% stenosed  Mid LAD-1 lesion is 60%  stenosed. The lesion is eccentric and irregular. The lesion is moderately calcified.  Mid LAD-2 lesion 95% stenosed  Mid LAD-2 lesion is 95% stenosed. Vessel is the culprit lesion. The lesion is segmental,  eccentric and irregular. The lesion is moderately calcified. Shelflike lesion  Mid LAD-3 lesion 45% stenosed with side branch in 3rd Diag 70% stenosed  Mid LAD-3 lesion is 45% stenosed with 70% stenosed side branch in 3rd Diag. The lesion is irregular.  Dist LAD lesion 80% stenosed  Dist LAD lesion is 80% stenosed. The lesion is focal and concentric.   First Diagonal Branch  There is moderate disease in the vessel.   Second Diagonal Branch  Vessel is small in size. There is moderate disease in the vessel.   Third Diagonal Branch  Vessel is small in size. There is moderate disease in the vessel.   Left Circumflex  Vessel is normal in caliber and large.  Ost Cx to Prox Cx lesion 30% stenosed  Ost Cx to Prox Cx lesion is 30% stenosed. The lesion is focal and irregular.  Prox Cx lesion 60% stenosed  Prox Cx lesion is 60% stenosed. The lesion is eccentric and irregular.  Prox Cx to Mid Cx lesion 80% stenosed  Prox Cx to Mid Cx lesion is 80% stenosed. The lesion is segmental, eccentric and irregular.   First Obtuse Marginal Branch  Vessel is small in size. There is mild diffuse disease in the vessel.   Second Obtuse Marginal Branch  Vessel is small in size.   Left Posterior Atrioventricular Artery  Vessel is small in size.   Right Coronary Artery  Vessel is large.  Prox RCA to Mid RCA lesion 5% stenosed  Prox RCA to Mid RCA lesion is 5% stenosed. The lesion was previously treated using a drug eluting stent between 1-2 years ago.  Mid RCA lesion 40% stenosed  Mid RCA lesion is 40% stenosed.   Right Ventricular Branch  Vessel is small in size.   Right Posterior Descending Artery  RPDA lesion 60% stenosed  RPDA lesion is 60% stenosed. The lesion is segmental.   Second Right Posterolateral Branch  Vessel is large in size. Vessels relatively large for a posterior lateral branch, but is only moderate in size.  2nd RPL lesion 80% stenosed  2nd RPL lesion is 80% stenosed. The  lesion is focal.     Intervention   No interventions have been documented.      Right Heart   Right Heart Pressures Hemodynamic findings consistent with moderate pulmonary hypertension. MODERATE-SEVERE PULMONARY VENOUS HYPERTENSION PAP 73/23 mmHg-mean 42 mmHg PCWP 46/62 (large V wave)-mean 39 mmHg Elevated LV EDP consistent with volume overload. LVP-EDP: 129/18 mmHg - 33 mmHg AOP-MAP: 127/55 mmHg - 88 mmHg Ao sat 90%, PA sat 46% --> CARDIAC OUTPUT-INDEX by FICK: 3.99-1.88 (severely reduced)   Right Atrium Right atrial pressure is elevated. RAP 13-15 mmHg   Right Ventricle RVP-EDP 65/3 mmHg - 13 mmHg (respiratory variation)   Impella/IABP   Hemodynamic Support An IABP was inserted for hemodynamic support in the setting of cardiogenic shock. To stabilize for staged PCI Access site:  right femoral artery. Fluoroscopically and ultrasound-guided access; 5 French micropuncture catheter exchanged to 6 French sheath and then using 0.35 wire, exchanged for IABP sheath. IABP inserted over 0.25 and placed in the aortic knob.  After lines aspirated and flushed, position confirmed fluoroscopically. Excellent augmentation pressures.      Wall Motion     Resting     LV gram  not performed        Left Heart   Left Ventricle LV end diastolic pressure is severely elevated. LVEDP 33 mmHg   Aortic Valve There is no aortic valve stenosis. The aortic valve is calcified.        Coronary Diagrams     Diagnostic Dominance: Right   Diagnostic Image    Intervention           Implants      No implant documentation for this case.         [employer] Images    Show images for CARDIAC CATHETERIZATION        Images on Long Term Storage    Show images for Abram, Sax "[name]"          Link to Procedure Log   Procedure Log     Hemo Data  Most Recent Value  Fick Cardiac Output 3.99 L/min  Fick Cardiac Output Index 1.88 (L/min)/BSA  RA  A Wave 17 mmHg  RA V Wave 17 mmHg  RA Mean 13 mmHg  RV Systolic Pressure 65 mmHg  RV Diastolic Pressure 3 mmHg  RV EDP 13 mmHg  PA Systolic Pressure 73 mmHg  PA Diastolic Pressure 23 mmHg  PA Mean 42 mmHg  PW A Wave 33 mmHg  PW V Wave 38 mmHg  PW Mean 28 mmHg  AO Systolic Pressure 030 mmHg  AO Diastolic Pressure 45 mmHg  AO Mean 73 mmHg  LV Systolic Pressure 092 mmHg  LV Diastolic Pressure 18 mmHg  LV EDP 33 mmHg  AOp Systolic Pressure 330 mmHg  AOp Diastolic Pressure 50 mmHg  AOp Mean Pressure 78 mmHg  LVp Systolic Pressure 076 mmHg  LVp Diastolic Pressure 17 mmHg  LVp EDP Pressure 33 mmHg  QP/QS 1  TPVR Index 22.3 HRUI  TSVR Index 42.47 HRUI  PVR SVR Ratio 0.04  TPVR/TSVR Ratio 0.53     ECHOCARDIOGRAM REPORT       Patient Name:  Daryl Rodriguez Date of Exam: 01/16/2020  Medical Rec #: [id number]   Height:    69.0 in  Accession #:  [id number]   Weight:    207.0 lb  Date of Birth: 14-May-1934   BSA:     2.096 m  Patient Age:  54 years    BP:      135/46 mmHg  Patient Gender: M       HR:      68 bpm.  Exam Location: Inpatient   Procedure: 2D Echo   Indications:  chest pain 786.50    History:    Patient has prior history of Echocardiogram examinations,  most         recent 03/22/2019. CHF, CAD, Defibrillator and balloon  pump,         Arrythmias:V-Tach; Risk Factors:Hypertension, Dyslipidemia  and         Diabetes.    Sonographer:  Johny Chess  Referring Phys: [place]    1. Left ventricular ejection fraction, by estimation, is 40 to 45%. The  left ventricle has mildly decreased function. The left ventricle  demonstrates regional wall motion abnormalities (see scoring  diagram/findings for description). Left ventricular  diastolic parameters are consistent with Grade II diastolic dysfunction  (pseudonormalization). Elevated left ventricular  end-diastolic pressure.  There is hypokinesis of the left ventricular, mid-apical anteroseptal  wall, anterior wall and anterolateral  wall.  2. Right ventricular systolic function is normal. The right ventricular  size is  normal.  3. Left atrial size was mild to moderately dilated.  4. Right atrial size was mildly dilated.  5. The mitral valve is normal in structure. Trivial mitral valve  regurgitation. No evidence of mitral stenosis.  6. The aortic valve is grossly normal. Aortic valve regurgitation is not  visualized. No aortic stenosis is present.  7. The inferior vena cava is dilated in size with >50% respiratory  variability, suggesting right atrial pressure of 8 mmHg.   Comparison(s): Changes from prior study are noted. EF and wall motion  abnormalities worse as compared to prior study.   FINDINGS  Left Ventricle: Left ventricular ejection fraction, by estimation, is 40  to 45%. The left ventricle has mildly decreased function. The left  ventricle demonstrates regional wall motion abnormalities. The left  ventricular internal cavity size was normal  in size. There is borderline left ventricular hypertrophy. Left  ventricular diastolic parameters are consistent with Grade II diastolic  dysfunction (pseudonormalization). Elevated left ventricular end-diastolic  pressure.   Right Ventricle: The right ventricular size is normal. No increase in  right ventricular wall thickness. Right ventricular systolic function is  normal.   Left Atrium: Left atrial size was mild to moderately dilated.   Right Atrium: Right atrial size was mildly dilated.   Pericardium: Trivial pericardial effusion is present.   Mitral Valve: The mitral valve is normal in structure. Trivial mitral  valve regurgitation. No evidence of mitral valve stenosis.   Tricuspid Valve: The tricuspid valve is normal in structure. Tricuspid  valve regurgitation is trivial. No evidence of tricuspid stenosis.    Aortic Valve: The aortic valve is grossly normal. Aortic valve  regurgitation is not visualized. No aortic stenosis is present.   Pulmonic Valve: The pulmonic valve was not well visualized. Pulmonic valve  regurgitation is not visualized. No evidence of pulmonic stenosis.   Aorta: The aortic root and ascending aorta are structurally normal, with  no evidence of dilitation.   Venous: The inferior vena cava is dilated in size with greater than 50%  respiratory variability, suggesting right atrial pressure of 8 mmHg.   IAS/Shunts: No atrial level shunt detected by color flow Doppler.   Additional Comments: A pacer wire is visualized in the right atrium and  right ventricle.     LEFT VENTRICLE  PLAX 2D  LVIDd:     5.70 cm   Diastology  LVIDs:     4.50 cm   LV e' lateral:  8.38 cm/s  LV PW:     1.40 cm   LV E/e' lateral: 11.9  LV IVS:    1.10 cm   LV e' medial:  5.98 cm/s  LVOT diam:   2.10 cm   LV E/e' medial: 16.7  LV SV:     59  LV SV Index:  28  LVOT Area:   3.46 cm    LV Volumes (MOD)  LV vol d, MOD A4C: 131.0 ml  LV vol s, MOD A4C: 83.0 ml  LV SV MOD A4C:   131.0 ml   RIGHT VENTRICLE  RV S prime:   15.60 cm/s  TAPSE (M-mode): 2.5 cm   LEFT ATRIUM      Index    RIGHT ATRIUM      Index  LA diam:   4.10 cm 1.96 cm/m RA Area:   16.60 cm  LA Vol (A4C): 75.6 ml 36.06 ml/m RA Volume:  42.40 ml 20.22 ml/m  AORTIC VALVE  LVOT Vmax:  107.00 cm/s  LVOT  Vmean: 63.100 cm/s  LVOT VTI:  0.170 m    AORTA  Ao Root diam: 3.60 cm   MITRAL VALVE  MV Area (PHT): 3.60 cm   SHUNTS  MV Decel Time: 211 msec   Systemic VTI: 0.17 m  MV E velocity: 100.00 cm/s Systemic Diam: 2.10 cm  MV A velocity: 82.60 cm/s  MV E/A ratio: 1.21   Buford Dresser MD  Electronically signed by Buford Dresser MD  Signature Date/Time: 01/16/2020/10:47:38 AM        Impression:  Patient has  longstanding history of multivessel coronary artery disease status post acute inferior wall myocardial infarction in 2019, ischemic cardiomyopathy with chronic combined systolic and diastolic congestive heart failure.  He was admitted with an acute non-ST segment elevation myocardial infarction with exacerbation of chronic heart failure complicated by hypoxemic respiratory failure and cardiogenic shock.  He has improved with aggressive medical therapy including intra-aortic balloon pump for mechanical support and intravenous milrinone.  I have personally reviewed the patient's recent diagnostic cardiac catheterization and compared it with the catheterization performed in 2019.  I have also reviewed the patient's recent transthoracic echocardiogram.  Risks associated with surgical revascularization under the current circumstances would be extremely high in this elderly gentleman.  I do not feel that he would do well with coronary artery bypass grafting at this time.    Plan:  I discussed the nature of the patient's complex circumstances at length with the patient and his daughter at the bedside.  We discussed long-term prognosis with medical therapy alone, multivessel PCI and stenting, and bypass surgery.  We discussed the very high risks associated with an attempt at surgical revascularization under the current circumstances and the reasons why I do not feel the patient should be considered a candidate for surgery at present time.  All of their questions have been addressed.  I favor proceeding with multivessel PCI and stenting as soon as practical if his intra-aortic balloon pump is to remain in place until revascularization is performed.   I spent in excess of 120 minutes during the conduct of this hospital consultation and >50% of this time involved direct face-to-face encounter for counseling and/or coordination of the patient's care.    Valentina Gu. Roxy Manns, MD 01/29/2020 5:12 PM

## 2020-01-17 NOTE — Progress Notes (Addendum)
Advanced Heart Failure Rounding Note  PCP-Cardiologist: [name], MD     Patient Profile   86 y/o male with h.o CAD, DM2, VT s/p ICD, HTN admitted with NSTEMI and acute systolic HF. Cath by Dr. [name] revealed severe 2v CAD in LD and LCX with borderline ostial LM lesion. Filling pressures markedly elevated with low output. Lactic acid up. IABP placed and milrinone started. Echo w/ EF 40-45%.   Subjective:    Went into rapid atrial fibrillation overnight. Rates in the 130s. Milrinone decreased by overnight attending, down to 0.125. IV amiodarone added this morning.  K 3.7. SCr up 1.40>>1.50. BP stable.    Remains in afib. Rates improved, 80s-110s. Not highly symptomatic.  Continues on IABP 1:1.   Co-ox 71%. No active CP. Denies dyspnea.   Remains on lasix gtt at 15 mg/hr. -4.8L in UOP overnight. Wt down total of 12 lb. CVP 5  Objective:   Weight Range: 92.5 kg Body mass index is 30.11 kg/m.   Vital Signs:   Temp:  [98.3 F (36.8 C)-99.9 F (37.7 C)] 98.6 F (37 C) (05/14 0800) Pulse Rate:  [66-249] 205 (05/14 0600) Resp:  [13-23] 21 (05/14 0800) BP: (114-144)/(35-67) 122/67 (05/14 0800) SpO2:  [93 %-99 %] 97 % (05/14 0800) Arterial Line BP: (113-303)/(29-68) 127/43 (05/14 0800) FiO2 (%):  [96 %] 96 % (05/13 1204) Weight:  [92.5 kg] 92.5 kg (05/14 0500)    Weight change: Filed Weights   01/12/2020 2100 01/16/20 0600 01/23/2020 0500  Weight: 97.8 kg 93.9 kg 92.5 kg    Intake/Output:   Intake/Output Summary (Last 24 hours) at 01/09/2020 0928 Last data filed at 01/07/2020 0900 Gross per 24 hour  Intake 1787.86 ml  Output 4925 ml  Net -3137.14 ml      Physical Exam    CVP 5 General:  Well appearing, elderly WM. No resp difficulty HEENT: Normal Neck: Supple. JVP 6-7 cm . Carotids 2+ bilat; no bruits. No lymphadenopathy or thyromegaly appreciated. Cor: PMI nondisplaced. Irreular rhythm, mildly tachy rate. No rubs, gallops or murmurs. + IABP, rt  femoral access site stable Lungs: Clear Abdomen: Soft, nontender, nondistended. No hepatosplenomegaly. No bruits or masses. Good bowel sounds. Extremities: No cyanosis, clubbing, rash, edema Neuro: Alert & orientedx3, cranial nerves grossly intact. moves all 4 extremities w/o difficulty. Affect pleasant   Telemetry   Afib, rates 80s-110s Personally reviewed   Labs    CBC Recent Labs    01/08/2020 1402 01/07/2020 1850 01/16/20 0429 01/16/20 0429 01/16/20 2141 02/02/2020 0411  WBC 8.3   < > 9.5  --   --  8.8  NEUTROABS 7.1  --   --   --   --   --   HGB 11.8*   < > 10.9*   < > 10.9* 10.7*  HCT 36.2*   < > 32.7*   < > 32.0* 32.4*  MCV 103.1*   < > 100.3*  --   --  100.6*  PLT 171   < > 172  --   --  149*   < > = values in this interval not displayed.   Basic Metabolic Panel Recent Labs    01/16/2020 2230 01/08/2020 2252 01/16/20 0429 01/16/20 0429 01/16/20 2141 01/24/2020 0411  NA 144   < > 140   < > 137 140  K 2.4*   < > 3.6   < > 3.2* 3.7  CL 122*   < > 106   < > 104 104  CO2 15*   < > 23  --   --  25  GLUCOSE 123*   < > 161*   < > 124* 130*  BUN 18   < > 24*   < > 26* 25*  CREATININE 0.83   < > 1.49*   < > 1.40* 1.50*  CALCIUM 5.0*   < > 8.7*  --   --  8.7*  MG 1.1*  --   --   --   --   --    < > = values in this interval not displayed.   Liver Function Tests No results for input(s): AST, ALT, ALKPHOS, BILITOT, PROT, ALBUMIN in the last 72 hours. No results for input(s): LIPASE, AMYLASE in the last 72 hours. Cardiac Enzymes No results for input(s): CKTOTAL, CKMB, CKMBINDEX, TROPONINI in the last 72 hours.  BNP: BNP (last 3 results) Recent Labs    01/06/2020 1403  BNP 597.8*    ProBNP (last 3 results) No results for input(s): PROBNP in the last 8760 hours.   D-Dimer No results for input(s): DDIMER in the last 72 hours. Hemoglobin A1C Recent Labs    02/03/2020 2230  HGBA1C 6.5*   Fasting Lipid Panel Recent Labs    01/16/20 0429  CHOL 132  HDL 57   LDLCALC 63  TRIG 60  CHOLHDL 2.3   Thyroid Function Tests Recent Labs    01/23/2020 2230  TSH 0.693    Other results:   Imaging    [place] EKG SITE RITE  Result Date: 01/16/2020 If Site Rite image not attached, placement could not be confirmed due to current cardiac rhythm.     Medications:     Scheduled Medications: . amiodarone  150 mg Intravenous Once  . aspirin EC  81 mg Oral Daily  . atorvastatin  80 mg Oral q1800  . Chlorhexidine Gluconate Cloth  6 each Topical Daily  . Chlorhexidine Gluconate Cloth  6 each Topical Daily  . gabapentin  300 mg Oral Daily  . gabapentin  600 mg Oral Daily  . insulin aspart  0-15 Units Subcutaneous Q4H  . iron polysaccharides  150 mg Oral Daily  . latanoprost  1 drop Both Eyes QHS  . mouth rinse  15 mL Mouth Rinse BID  . sodium chloride flush  10-40 mL Intracatheter Q12H  . sodium chloride flush  3 mL Intravenous Q12H  . vitamin B-12  500 mcg Oral Daily  . Vitamin D (Ergocalciferol)  50,000 Units Oral Q Thu     Infusions: . sodium chloride    . sodium chloride    . sodium chloride    . sodium chloride Stopped (01/16/20 1752)  . amiodarone    . amiodarone 60 mg/hr (01/04/2020 0921)   Followed by  . amiodarone    . furosemide (LASIX) infusion 15 mg/hr (01/10/2020 0900)  . heparin 1,150 Units/hr (01/11/2020 0900)  . milrinone 0.125 mcg/kg/min (01/08/2020 0900)  . nitroGLYCERIN 5 mcg/min (01/31/2020 1925)     PRN Medications:  Place/Maintain arterial line **AND** sodium chloride, Place/Maintain arterial line **AND** sodium chloride, sodium chloride, sodium chloride, acetaminophen, nitroGLYCERIN, ondansetron (ZOFRAN) IV, sodium chloride flush, sodium chloride flush   Assessment/Plan   1. NSTEMI  -Known coronary disease. In 2019 had DES to RCA.  -LHC 01/29/2020 with multivessel disease.   Per Dr [name]. Plan would be to stage PCI of the LAD at least which would be a very long lesion covering from the 60% to the 80% stenoses. If  successful, would then consider addressing the LCx.  - No chest pain. On heparin.  - On ASA 81   - On atorvastatin 80 mg daily    2. Cardiogenic Shock/Acute Systolic Heart Failure in the setting of NSTEMI - ICM. ECHO 2020 EF 55%---> EF today down 40-45%  - IABP placed in cath lab 5/12. Currently at 1:1 - Continue milrione 0.125 mcg. CO-OX 71 %.  - Volume status improved w/ Lasix gtt. CVP down to 5. Wt back down to baseline home wts.  - Stop Lasix gtt. Transition back to PO regimen in am.   3. Acute Hypoxic Respiratory Failure - Sats in the ED  80s . Required Bipap.  - improved w/ diuresis   4. Elevated Lactic Acid  - Lactic Acid on admit 2.5 2/2 cardiogenic shock >> normalized to 1.7   5. OSA  - Uses CPAP at home  -  Will need to continue  6. H/O of VT - Has ICD - on PO amiodarone 200 mg daily PTA - now on IV amiodarone for rapid AFib  7. AKI  - Creatinine baseline 1-1.2 - Possible contrast nephropathy - Todays creatinine is 1.5.  - stop IV Lasix today (CVP 5) -  f/u BMP in am.   8. Afib w/ RVR - went into rapid afib on 5/14 - milrinone rate reduced, IV amiodarone added - rates improved 80-110s today. Monitor - continue IV heparin  - may need TEE/DCCV if he remains in afib   Length of Stay: 2  Lyda Jester, PA-C  01/20/2020, 9:28 AM  Advanced Heart Failure Team Pager [phone] (M-F; 7a - 4p)  Please contact [facility] for night-coverage after hours (4p -7a ) and weekends on [email or web address]  Agree with above.   Remains on milrinone and IABP 1:1. Excellent diuresis. Breathing better. Developed AF with RVR this am and had associated CP. Seen by Dr. [name] and PCI postponed. Creatinine up slightly.   General:  Elderly male lying in bed No resp difficulty HEENT: normal Neck: supple. no JVD. Carotids 2+ bilat; no bruits. No lymphadenopathy or thryomegaly appreciated. Cor: PMI nondisplaced. Irreg tachy No rubs, gallops or murmurs. Lungs: clear Abdomen:  soft, nontender, nondistended. No hepatosplenomegaly. No bruits or masses. Good bowel sounds. Extremities: no cyanosis, clubbing, rash, edema RFA IABP Neuro: alert & orientedx3, cranial nerves grossly intact. moves all 4 extremities w/o difficulty. Affect pleasant  He is improved but remains tenuous. Now in AF with RVR with associated ischemic CP and slightly worsening hemodynamics. He is now on IV amio. Suspect he will convert soon. And perform DC-CV as needed as he has been on heparin for IABP and NSTEMI.   Will leave IABP this weekend. Continue low dose milrinone. Can possibly wean off tomorrow.   I d/w Dr. [name]. He is planning review with TCTS for possible CAGG. If not candidate (I do not think he is great candidate). Will plan PCI and atherectomy on Tuesday (will likely need ostial LM PCI) with Impella support. If this is the plan, hopefully we can wean the IABP over the weekend and get him up and around prior to PCI.   CRITICAL CARE Performed by: Glori Bickers  Total critical care time: 35 minutes  Critical care time was exclusive of separately billable procedures and treating other patients.  Critical care was necessary to treat or prevent imminent or life-threatening deterioration.  Critical care was time spent personally by me (independent of midlevel providers or residents) on the following activities: development of  treatment plan with patient and/or surrogate as well as nursing, discussions with consultants, evaluation of patient's response to treatment, examination of patient, obtaining history from patient or surrogate, ordering and performing treatments and interventions, ordering and review of laboratory studies, ordering and review of radiographic studies, pulse oximetry and re-evaluation of patient's condition.  Glori Bickers, MD  1:41 PM

## 2020-01-17 NOTE — Significant Event (Addendum)
Notified by RN that patient had gone into AF with RVR with HR 130s.   MAP unchanged, IABP augmenting well. UOP robust.  Chart reviewed and noted that he was not on anticoagulation prior to admission.  Does have ICD, but only single lead.  Therefore, cannot confirm that AF onset yesterday was in fact new.  Thus, deferred amiodarone infusion given stroke risk with chemical cardioversion. Decreased milrinone to 0.125 with some improvement in rates (130s->120s).  Continues on oral amiodarone 200mg  daily (for VT).   May need to consider repeat TEE and DCCV if remain in AF. CHADS-2-Vasc = 6.  Made NPO in case this procedure deemed necessary in AM.   Daryl Cush K. Marletta Lor, MD

## 2020-01-17 NOTE — Progress Notes (Signed)
ANTICOAGULATION CONSULT NOTE  Pharmacy Consult for heparin Indication: chest pain/ACS  No Known Allergies  Patient Measurements: Height: 5\' 9"  (175.3 cm) Weight: 92.5 kg (203 lb 14.8 oz) IBW/kg (Calculated) : 70.7 Heparin Dosing Weight: 89 kg   Vital Signs: Temp: 98.6 F (37 C) (05/14 0800) Temp Source: Oral (05/14 0800) BP: 122/67 (05/14 0800) Pulse Rate: 205 (05/14 0600)  Labs: Recent Labs     0000 01/31/2020 1402 01/20/2020 1548 01/23/2020 1850 01/22/2020 2230 01/20/2020 2252 01/16/20 0429 01/16/20 0429 01/16/20 0430 01/16/20 1145 01/16/20 2141 01/22/2020 0411 01/17/20 0705  HGB  --  11.8*  --    < > 11.6*   < > 10.9*   < >  --   --  10.9* 10.7*  --   HCT  --  36.2*  --    < > 35.1*   < > 32.7*  --   --   --  32.0* 32.4*  --   PLT   < > 171  --   --  175  --  172  --   --   --   --  149*  --   LABPROT  --   --   --   --  14.7  --   --   --   --   --   --   --   --   INR  --   --   --   --  1.2  --   --   --   --   --   --   --   --   HEPARINUNFRC  --   --   --   --   --   --   --   --  0.47 0.32  --   --  0.24*  CREATININE   < > 1.64*  --   --  0.83   < > 1.49*  --   --   --  1.40* 1.50*  --   TROPONINIHS  --  603* 2,319*  --   --   --   --   --   --   --   --   --   --    < > = values in this interval not displayed.    Estimated Creatinine Clearance: 41.9 mL/min (A) (by C-G formula based on SCr of 1.5 mg/dL (H)).   Medical History: Past Medical History:  Diagnosis Date  . Acute respiratory failure ([place])   . BPH (benign prostatic hyperplasia)   . Cardiac arrest ([place])   . Carpal tunnel syndrome   . CKD (chronic kidney disease)   . Diabetes ([place])   . DM II (diabetes mellitus, type II), controlled ([place])   . High risk medication use   . Hyperlipidemia   . Hypertension   . Low vitamin D level   . STEMI (ST elevation myocardial infarction) ([place])    01/22/18 PCI/DES to RCA  . Systolic heart failure ([place])   . VT (ventricular tachycardia) ([facility])     Medications:   Medications Prior to Admission  Medication Sig Dispense Refill Last Dose  . amiodarone (PACERONE) 200 MG tablet Take one tablet by mouth daily Monday through Saturday.  Do NOT take on Sunday. (Patient taking differently: Take 200 mg by mouth See admin instructions. Take 200 mg by mouth in the morning on Mon/Tues/Wed/Thurs/Fri/Sat and nothing on Sunday) 90 tablet 3 01/07/2020 at am  . amLODipine (NORVASC) 10 MG tablet Take 10  mg by mouth daily.    01/16/2020 at am  . ammonium lactate (LAC-HYDRIN) 12 % lotion Apply 1 application topically 2 (two) times daily as needed (to affected areas of legs and hands).    01/17/2020 at Unknown time  . aspirin EC 81 MG tablet Take 81 mg by mouth daily.   01/29/2020 at 0800  . atorvastatin (LIPITOR) 80 MG tablet TAKE 1 TABLET BY MOUTH ONCE DAILY AT  6  PM (Patient taking differently: Take 80 mg by mouth See admin instructions. Take 80 mg by mouth at 5 PM daily) 90 tablet 1 01/14/2020 at pm  . clopidogrel (PLAVIX) 75 MG tablet Take 1 tablet by mouth once daily (Patient taking differently: Take 75 mg by mouth daily. ) 90 tablet 3 01/12/2020 at 0800  . FERREX 150 150 MG capsule TAKE 1 CAPSULE BY MOUTH ONCE DAILY (Patient taking differently: Take 150 mg by mouth daily. ) 90 capsule 3 01/09/2020 at am  . furosemide (LASIX) 40 MG tablet Take 1 tablet (40 mg total) by mouth daily. (Patient taking differently: Take 40 mg by mouth See admin instructions. Take 40 mg by mouth in the morning and 40 mg at 1:30 PM) 90 tablet 3 01/23/2020 at am  . gabapentin (NEURONTIN) 300 MG capsule Take 300-600 mg by mouth See admin instructions. Take 300 mg by mouth in the morning and 600 mg at 5 PM  1 01/21/2020 at am  . glimepiride (AMARYL) 2 MG tablet Take 1 mg by mouth daily with breakfast.    01/04/2020 at am  . isosorbide mononitrate (IMDUR) 30 MG 24 hr tablet Take 1 tablet by mouth once daily (Patient taking differently: Take 30 mg by mouth in the morning. ) 90 tablet 0 01/08/2020 at am  .  latanoprost (XALATAN) 0.005 % ophthalmic solution Place 1 drop into both eyes at bedtime.   01/14/2020 at pm  . losartan (COZAAR) 100 MG tablet Take 1 tablet (100 mg total) by mouth daily. (Patient taking differently: Take 100 mg by mouth See admin instructions. Take 100 mg by mouth at 5 PM daily) 90 tablet 3 01/14/2020 at pm  . metoprolol succinate (TOPROL-XL) 50 MG 24 hr tablet Take 1 tablet (50 mg total) by mouth daily. Take with or immediately following a meal. 90 tablet 3 01/14/2020 at 0800  . nitroGLYCERIN (NITROSTAT) 0.4 MG SL tablet Place 1 tablet (0.4 mg total) under the tongue every 5 (five) minutes x 3 doses as needed for chest pain. 25 tablet 1 01/08/2020 at Unknown time  . potassium chloride SA (KLOR-CON M20) 20 MEQ tablet Take 1 tablet (20 mEq total) by mouth daily. 30 tablet 3 01/12/2020 at am  . terazosin (HYTRIN) 1 MG capsule Take 1 mg by mouth See admin instructions. Take 1 mg by mouth at 5 PM daily   01/14/2020 at pm  . triamcinolone cream (KENALOG) 0.1 % Apply 1 application topically as needed (to itching areas of skin).    unk at unk  . vitamin B-12 (CYANOCOBALAMIN) 500 MCG tablet Take 500 mcg by mouth daily.   01/11/2020 at am  . Vitamin D, Ergocalciferol, (DRISDOL) 50000 units CAPS capsule Take 50,000 Units by mouth every Thursday.    01/09/2020 at Unknown time    Assessment: 73 YOM with elevated troponin to start IV heparin for ACS. H/H low. Plt wnl. Heparin 4000 units IV bolus given in the ED  Patient now s/p left and right heart cath with IABP insertion. Pt also in new onset  AF RVR overnight so will increase heparin level goal. Heparin level 0.24, CBC stable.  Goal of Therapy:  Heparin level 0.3-0.5 units/ml Monitor platelets by anticoagulation protocol: Yes   Plan:  -Increase heparin to 1150 units/h -Daily heparin level and CBC   Arrie Senate, PharmD, BCPS Clinical Pharmacist [phone] Please check AMION for all [facility] Pharmacy numbers 01/20/2020

## 2020-01-18 LAB — BASIC METABOLIC PANEL
Anion gap: 10 (ref 5–15)
BUN: 26 mg/dL — ABNORMAL HIGH (ref 8–23)
CO2: 27 mmol/L (ref 22–32)
Calcium: 8.5 mg/dL — ABNORMAL LOW (ref 8.9–10.3)
Chloride: 100 mmol/L (ref 98–111)
Creatinine, Ser: 1.39 mg/dL — ABNORMAL HIGH (ref 0.61–1.24)
GFR calc Af Amer: 54 mL/min — ABNORMAL LOW (ref >60)
GFR calc non Af Amer: 47 mL/min — ABNORMAL LOW (ref >60)
Glucose, Bld: 212 mg/dL — ABNORMAL HIGH (ref 70–99)
Potassium: 3.3 mmol/L — ABNORMAL LOW (ref 3.5–5.1)
Sodium: 137 mmol/L (ref 135–145)

## 2020-01-18 LAB — HEPARIN LEVEL (UNFRACTIONATED): Heparin Unfractionated: 0.32 IU/mL (ref 0.30–0.70)

## 2020-01-18 LAB — CBC
HCT: 32.7 % — ABNORMAL LOW (ref 39.0–52.0)
Hemoglobin: 10.7 g/dL — ABNORMAL LOW (ref 13.0–17.0)
MCH: 33.4 pg (ref 26.0–34.0)
MCHC: 32.7 g/dL (ref 30.0–36.0)
MCV: 102.2 fL — ABNORMAL HIGH (ref 80.0–100.0)
Platelets: 140 10*3/uL — ABNORMAL LOW (ref 150–400)
RBC: 3.2 MIL/uL — ABNORMAL LOW (ref 4.22–5.81)
RDW: 14.3 % (ref 11.5–15.5)
WBC: 9.6 10*3/uL (ref 4.0–10.5)
nRBC: 0 % (ref 0.0–0.2)

## 2020-01-18 LAB — COOXEMETRY PANEL
Carboxyhemoglobin: 1.5 % (ref 0.5–1.5)
Methemoglobin: 1.1 % (ref 0.0–1.5)
O2 Saturation: 80.6 %
Total hemoglobin: 13.5 g/dL (ref 12.0–16.0)

## 2020-01-18 LAB — POCT ACTIVATED CLOTTING TIME: Activated Clotting Time: 136 seconds

## 2020-01-18 LAB — GLUCOSE, CAPILLARY
Glucose-Capillary: 124 mg/dL — ABNORMAL HIGH (ref 70–99)
Glucose-Capillary: 158 mg/dL — ABNORMAL HIGH (ref 70–99)
Glucose-Capillary: 165 mg/dL — ABNORMAL HIGH (ref 70–99)
Glucose-Capillary: 197 mg/dL — ABNORMAL HIGH (ref 70–99)
Glucose-Capillary: 211 mg/dL — ABNORMAL HIGH (ref 70–99)
Glucose-Capillary: 91 mg/dL (ref 70–99)
Glucose-Capillary: 96 mg/dL (ref 70–99)

## 2020-01-18 MED ORDER — HEPARIN (PORCINE) 25000 UT/250ML-% IV SOLN
1000.0000 [IU]/h | INTRAVENOUS | Status: DC
  Administered 2020-01-18: 1000 [IU]/h via INTRAVENOUS
  Administered 2020-01-20 – 2020-01-22 (×3): 1200 [IU]/h via INTRAVENOUS
  Administered 2020-01-23: 1000 [IU]/h via INTRAVENOUS
  Filled 2020-01-18 (×6): qty 250

## 2020-01-18 MED ORDER — POTASSIUM CHLORIDE CRYS ER 20 MEQ PO TBCR: 40.0000 meq | EXTENDED_RELEASE_TABLET | Freq: Once | ORAL | Status: DC

## 2020-01-18 MED ORDER — POTASSIUM CHLORIDE CRYS ER 20 MEQ PO TBCR
40.0000 meq | EXTENDED_RELEASE_TABLET | Freq: Once | ORAL | Status: AC
  Administered 2020-01-18: 40 meq via ORAL
  Filled 2020-01-18: qty 2

## 2020-01-18 MED ORDER — SPIRONOLACTONE 12.5 MG HALF TABLET
12.5000 mg | ORAL_TABLET | Freq: Every day | ORAL | Status: DC
  Administered 2020-01-18 – 2020-01-29 (×12): 12.5 mg via ORAL
  Filled 2020-01-18 (×12): qty 1

## 2020-01-18 MED ORDER — POTASSIUM CHLORIDE CRYS ER 10 MEQ PO TBCR
40.0000 meq | EXTENDED_RELEASE_TABLET | Freq: Every day | ORAL | Status: DC
  Administered 2020-01-18 – 2020-01-23 (×6): 40 meq via ORAL
  Filled 2020-01-18 (×9): qty 4

## 2020-01-18 MED ORDER — FUROSEMIDE 40 MG PO TABS
40.0000 mg | ORAL_TABLET | Freq: Every day | ORAL | Status: DC
  Administered 2020-01-18 – 2020-01-21 (×4): 40 mg via ORAL
  Filled 2020-01-18 (×4): qty 1

## 2020-01-18 NOTE — Plan of Care (Signed)
Pt is alert and oriented, on 6L [place], CPAP for comfort as needed. Pt has denied chest pain and general discomfort throughout the night but did request sleeping medication. Med given per order, see MAR. Pt is able to move all extremities and voices understanding of NWB orders on right leg due to IABP. No bleeding or complications noted. Pt's heart rate has been SR with a 1st degree heart block per rhythm strip measurements. Blood pressure has maintained stable but slightly hypertensive at times. Pt vagals down randomly at times, decreasing blood pressure to within normal range. Pt verbalizes understanding of medications and plan of care. Pt has had adequate UOP, foley remains in place. Call bell is within reach, bed alarm is on, and bed is in lowest position. Problem: Education: Goal: Knowledge of General Education information will improve Description: Including pain rating scale, medication(s)/side effects and non-pharmacologic comfort measures Outcome: Progressing   Problem: Clinical Measurements: Goal: Cardiovascular complication will be avoided Outcome: Progressing   Problem: Coping: Goal: Level of anxiety will decrease Outcome: Progressing   Problem: Elimination: Goal: Will not experience complications related to urinary retention Outcome: Progressing   Problem: Pain Managment: Goal: General experience of comfort will improve Outcome: Progressing   Problem: Safety: Goal: Ability to remain free from injury will improve Outcome: Progressing   Problem: Cardiovascular: Goal: Vascular access site(s) Level 0-1 will be maintained Outcome: Progressing

## 2020-01-18 NOTE — Progress Notes (Signed)
ANTICOAGULATION CONSULT NOTE  Pharmacy Consult for heparin Indication: chest pain/ACS  No Known Allergies  Patient Measurements: Height: 5\' 9"  (175.3 cm) Weight: 93.4 kg (205 lb 14.6 oz) IBW/kg (Calculated) : 70.7 Heparin Dosing Weight: 89 kg   Vital Signs: Temp: 98.1 F (36.7 C) (05/15 0800) Temp Source: Axillary (05/15 0400) BP: 142/67 (05/15 0700) Pulse Rate: 189 (05/15 0900)  Labs: Recent Labs     0000 01/29/2020 1402 01/31/2020 1548 01/14/2020 1850 01/14/2020 2230 01/11/2020 2252 01/16/20 0429 01/16/20 0429 01/16/20 0430 01/16/20 1145 01/16/20 2141 01/16/20 2141 01/09/2020 0411 01/27/2020 0705 01/18/20 0307  HGB  --  11.8*  --    < > 11.6*   < > 10.9*   < >  --   --  10.9*   < > 10.7*  --  10.7*  HCT  --  36.2*  --    < > 35.1*   < > 32.7*   < >  --   --  32.0*  --  32.4*  --  32.7*  PLT   < > 171  --   --  175   < > 172  --   --   --   --   --  149*  --  140*  LABPROT  --   --   --   --  14.7  --   --   --   --   --   --   --   --   --   --   INR  --   --   --   --  1.2  --   --   --   --   --   --   --   --   --   --   HEPARINUNFRC  --   --   --   --   --   --   --   --    < > 0.32  --   --   --  0.24* 0.32  CREATININE   < > 1.64*  --   --  0.83   < > 1.49*   < >  --   --  1.40*  --  1.50*  --  1.39*  TROPONINIHS  --  603* 2,319*  --   --   --   --   --   --   --   --   --   --   --   --    < > = values in this interval not displayed.    Estimated Creatinine Clearance: 45.4 mL/min (A) (by C-G formula based on SCr of 1.39 mg/dL (H)).   Medical History: Past Medical History:  Diagnosis Date  . Acute respiratory failure ([place])   . BPH (benign prostatic hyperplasia)   . Cardiac arrest ([place])   . Carpal tunnel syndrome   . CKD (chronic kidney disease)   . Diabetes ([place])   . DM II (diabetes mellitus, type II), controlled ([place])   . High risk medication use   . Hyperlipidemia   . Hypertension   . Low vitamin D level   . STEMI (ST elevation myocardial infarction) ([place])     01/22/18 PCI/DES to RCA  . Systolic heart failure ([place])   . VT (ventricular tachycardia) ([facility])     Medications:  Medications Prior to Admission  Medication Sig Dispense Refill Last Dose  . amiodarone (PACERONE) 200 MG tablet Take one tablet by mouth daily  Monday through Saturday.  Do NOT take on Sunday. (Patient taking differently: Take 200 mg by mouth See admin instructions. Take 200 mg by mouth in the morning on Mon/Tues/Wed/Thurs/Fri/Sat and nothing on Sunday) 90 tablet 3 01/25/2020 at am  . amLODipine (NORVASC) 10 MG tablet Take 10 mg by mouth daily.    01/05/2020 at am  . ammonium lactate (LAC-HYDRIN) 12 % lotion Apply 1 application topically 2 (two) times daily as needed (to affected areas of legs and hands).    01/11/2020 at Unknown time  . aspirin EC 81 MG tablet Take 81 mg by mouth daily.   02/01/2020 at 0800  . atorvastatin (LIPITOR) 80 MG tablet TAKE 1 TABLET BY MOUTH ONCE DAILY AT  6  PM (Patient taking differently: Take 80 mg by mouth See admin instructions. Take 80 mg by mouth at 5 PM daily) 90 tablet 1 01/14/2020 at pm  . clopidogrel (PLAVIX) 75 MG tablet Take 1 tablet by mouth once daily (Patient taking differently: Take 75 mg by mouth daily. ) 90 tablet 3 02/03/2020 at 0800  . FERREX 150 150 MG capsule TAKE 1 CAPSULE BY MOUTH ONCE DAILY (Patient taking differently: Take 150 mg by mouth daily. ) 90 capsule 3 01/21/2020 at am  . furosemide (LASIX) 40 MG tablet Take 1 tablet (40 mg total) by mouth daily. (Patient taking differently: Take 40 mg by mouth See admin instructions. Take 40 mg by mouth in the morning and 40 mg at 1:30 PM) 90 tablet 3 01/17/2020 at am  . gabapentin (NEURONTIN) 300 MG capsule Take 300-600 mg by mouth See admin instructions. Take 300 mg by mouth in the morning and 600 mg at 5 PM  1 01/05/2020 at am  . glimepiride (AMARYL) 2 MG tablet Take 1 mg by mouth daily with breakfast.    01/11/2020 at am  . isosorbide mononitrate (IMDUR) 30 MG 24 hr tablet Take 1 tablet by mouth  once daily (Patient taking differently: Take 30 mg by mouth in the morning. ) 90 tablet 0 01/23/2020 at am  . latanoprost (XALATAN) 0.005 % ophthalmic solution Place 1 drop into both eyes at bedtime.   01/14/2020 at pm  . losartan (COZAAR) 100 MG tablet Take 1 tablet (100 mg total) by mouth daily. (Patient taking differently: Take 100 mg by mouth See admin instructions. Take 100 mg by mouth at 5 PM daily) 90 tablet 3 01/14/2020 at pm  . metoprolol succinate (TOPROL-XL) 50 MG 24 hr tablet Take 1 tablet (50 mg total) by mouth daily. Take with or immediately following a meal. 90 tablet 3 01/12/2020 at 0800  . nitroGLYCERIN (NITROSTAT) 0.4 MG SL tablet Place 1 tablet (0.4 mg total) under the tongue every 5 (five) minutes x 3 doses as needed for chest pain. 25 tablet 1 01/11/2020 at Unknown time  . potassium chloride SA (KLOR-CON M20) 20 MEQ tablet Take 1 tablet (20 mEq total) by mouth daily. 30 tablet 3 01/06/2020 at am  . terazosin (HYTRIN) 1 MG capsule Take 1 mg by mouth See admin instructions. Take 1 mg by mouth at 5 PM daily   01/14/2020 at pm  . triamcinolone cream (KENALOG) 0.1 % Apply 1 application topically as needed (to itching areas of skin).    unk at unk  . vitamin B-12 (CYANOCOBALAMIN) 500 MCG tablet Take 500 mcg by mouth daily.   01/06/2020 at am  . Vitamin D, Ergocalciferol, (DRISDOL) 50000 units CAPS capsule Take 50,000 Units by mouth every Thursday.  01/09/2020 at Unknown time    Assessment: 83 YOM with elevated troponin to start IV heparin for ACS. H/H low. Plt wnl. Heparin 4000 units IV bolus given in the ED  Patient now s/p left and right heart cath with IABP insertion. Pt also in new onset AF RVR.  Heparin level this AM at goal level, then held for IABP removal.  Hemostasis achieved at 1058 AM.  Discussed with Dr. Haroldine Laws, okay to resume heparin 8 hrs after removal.  Goal of Therapy:  Heparin level 0.3-0.5 units/ml Monitor platelets by anticoagulation protocol: Yes   Plan:  -Per  discussion with MD, will restart heparin at 1000 units/hr.  -Check heparin level 8 hrs after gtt starts.  No plans to titrate heparin up, may titrate down if level elevated. -Daily heparin level and CBC.  Marguerite Olea, [facility] Clinical Pharmacist Phone [phone]  01/18/2020 11:02 AM

## 2020-01-18 NOTE — Progress Notes (Signed)
Advanced Heart Failure Rounding Note  PCP-Cardiologist: Daryl Campus, MD     Patient Profile   86 y/o male with h.o CAD, DM2, VT s/p ICD, HTN admitted with NSTEMI and acute systolic HF. Cath by Dr. Ellyn Rodriguez revealed severe 2v CAD in LD and LCX with borderline ostial LM lesion. Filling pressures markedly elevated with low output. Lactic acid up. IABP placed and milrinone started. Echo w/ EF 40-45%.   Subjective:    Was in AF with RVR esterday and developed CP. Converted with IV amio. Now in NSR in 60s. Remains on IABP 1:1 and milrinone 0.125  Denies CP or SOB this am.   Seen by TCTS yesterday and felt not to be surgical candidate   Objective:   Weight Range: 93.4 kg Body mass index is 30.41 kg/m.   Vital Signs:   Temp:  [98.1 F (36.7 C)-99.9 F (37.7 C)] 98.1 F (36.7 C) (05/15 0800) Pulse Rate:  [80-218] 189 (05/15 0900) Resp:  [12-28] 20 (05/15 0900) BP: (132-155)/(35-67) 142/67 (05/15 0700) SpO2:  [93 %-97 %] 97 % (05/15 0900) Arterial Line BP: (116-278)/(24-268) 278/268 (05/15 0900) Weight:  [93.4 kg] 93.4 kg (05/15 0500)    Weight change: Filed Weights   01/16/20 0600 01/05/2020 0500 01/18/20 0500  Weight: 93.9 kg 92.5 kg 93.4 kg    Intake/Output:   Intake/Output Summary (Last 24 hours) at 01/18/2020 0950 Last data filed at 01/18/2020 0900 Gross per 24 hour  Intake 1123.65 ml  Output 2017 ml  Net -893.35 ml      Physical Exam   General:  Elderly male lying in bed. No resp difficulty HEENT: normal Neck: supple. no JVD. Carotids 2+ bilat; no bruits. No lymphadenopathy or thryomegaly appreciated. Cor: PMI nondisplaced. Regular rate & rhythm. No rubs, gallops or murmurs. Lungs: clear Abdomen: soft, nontender, nondistended. No hepatosplenomegaly. No bruits or masses. Good bowel sounds. Extremities: no cyanosis, clubbing, rash, edema RFA IABP site ok  Neuro: alert & orientedx3, cranial nerves grossly intact. moves all 4 extremities w/o difficulty.  Affect pleasant   Telemetry   NSR 60s Personally reviewed   Labs    CBC Recent Labs    01/16/2020 1402 01/20/2020 1850 01/13/2020 0411 01/18/20 0307  WBC 8.3   < > 8.8 9.6  NEUTROABS 7.1  --   --   --   HGB 11.8*   < > 10.7* 10.7*  HCT 36.2*   < > 32.4* 32.7*  MCV 103.1*   < > 100.6* 102.2*  PLT 171   < > 149* 140*   < > = values in this interval not displayed.   Basic Metabolic Panel Recent Labs    01/14/2020 2230 01/14/2020 2252 01/07/2020 0411 01/18/2020 1010 01/18/20 0307  NA 144   < > 140  --  137  K 2.4*   < > 3.7  --  3.3*  CL 122*   < > 104  --  100  CO2 15*   < > 25  --  27  GLUCOSE 123*   < > 130*  --  212*  BUN 18   < > 25*  --  26*  CREATININE 0.83   < > 1.50*  --  1.39*  CALCIUM 5.0*   < > 8.7*  --  8.5*  MG 1.1*  --   --  2.0  --    < > = values in this interval not displayed.   Liver Function Tests No results for input(s): AST, ALT,  ALKPHOS, BILITOT, PROT, ALBUMIN in the last 72 hours. No results for input(s): LIPASE, AMYLASE in the last 72 hours. Cardiac Enzymes No results for input(s): CKTOTAL, CKMB, CKMBINDEX, TROPONINI in the last 72 hours.  BNP: BNP (last 3 results) Recent Labs    01/14/2020 1403  BNP 597.8*    ProBNP (last 3 results) No results for input(s): PROBNP in the last 8760 hours.   D-Dimer No results for input(s): DDIMER in the last 72 hours. Hemoglobin A1C Recent Labs    01/04/2020 2230  HGBA1C 6.5*   Fasting Lipid Panel Recent Labs    01/16/20 0429  CHOL 132  HDL 57  LDLCALC 63  TRIG 60  CHOLHDL 2.3   Thyroid Function Tests Recent Labs    01/28/2020 2230  TSH 0.693    Other results:   Imaging    No results found.   Medications:     Scheduled Medications: . amLODipine  5 mg Oral Daily  . aspirin EC  81 mg Oral Daily  . atorvastatin  80 mg Oral q1800  . Chlorhexidine Gluconate Cloth  6 each Topical Daily  . Chlorhexidine Gluconate Cloth  6 each Topical Daily  . clopidogrel  75 mg Oral Daily  . docusate  sodium  100 mg Oral Daily  . gabapentin  300 mg Oral Daily  . gabapentin  600 mg Oral Daily  . insulin aspart  0-15 Units Subcutaneous Q4H  . iron polysaccharides  150 mg Oral Daily  . latanoprost  1 drop Both Eyes QHS  . mouth rinse  15 mL Mouth Rinse BID  . melatonin  3 mg Oral QHS  . potassium chloride  40 mEq Oral Daily  . sodium chloride flush  10-40 mL Intracatheter Q12H  . sodium chloride flush  3 mL Intravenous Q12H  . vitamin B-12  500 mcg Oral Daily  . Vitamin D (Ergocalciferol)  50,000 Units Oral Q Thu    Infusions: . sodium chloride    . sodium chloride    . sodium chloride    . sodium chloride Stopped (01/16/20 1752)  . amiodarone 30 mg/hr (01/18/20 0900)  . heparin Stopped (01/18/20 0853)  . milrinone 0.125 mcg/kg/min (01/18/20 0900)    PRN Medications: Place/Maintain arterial line **AND** sodium chloride, Place/Maintain arterial line **AND** sodium chloride, sodium chloride, sodium chloride, acetaminophen, nitroGLYCERIN, ondansetron (ZOFRAN) IV, sodium chloride flush, sodium chloride flush   Assessment/Plan   1. NSTEMI  -Known coronary disease. In 2019 had DES to RCA.  -LHC 02/01/2020 with multivessel disease including LM disease  - On ASA 81   - On atorvastatin 80 mg daily  - case reviewed extensively with Dr. Ellyn Rodriguez and Dr.Owen. Agree that best plan is for MV atherectomy/stenting including possible ostial LM early this week with Impella support - will pull IABP today  - holding b-blockers for now with shock on admit. Can resume as tolerated   2. Cardiogenic Shock/Acute Systolic Heart Failure in the setting of NSTEMI - ICM. ECHO 2020 EF 55%---> EF today down 40-45%  - IABP placed in cath lab 5/12. Currently at 1:1 - On milrione 0.125 mcg. CO-OX 81 %.  - Volume status much better - Pull IABP today. If co-ox stable can wean milrinone later.  - Resume po diuretics  3. Acute Hypoxic Respiratory Failure due to pulmonary edema - Sats in the ED  80s .  Required Bipap.  - improved w/ diuresis   4. Elevated Lactic Acid  - Lactic Acid on admit 2.5 2/2  cardiogenic shock >> nresolved  5. OSA  - Uses CPAP at home  -  Will need to continue  6. H/O of VT - Has ICD - on PO amiodarone 200 mg daily PTA - now on IV amiodarone for rapid AFib  7. AKI  - Creatinine baseline 1-1.2 - Likely due to HF/shock - Todays creatinine is 1.39.  - resume diuretics carefully  8. Afib w/ RVR - went into rapid afib on 5/14 - back in NSR on IV amio - continue IV amio. Resume heparin after IABP pull   9. Hypokalemia - supp  CRITICAL CARE Performed by: Glori Bickers  Total critical care time: 35 minutes  Critical care time was exclusive of separately billable procedures and treating other patients.  Critical care was necessary to treat or prevent imminent or life-threatening deterioration.  Critical care was time spent personally by me (independent of midlevel providers or residents) on the following activities: development of treatment plan with patient and/or surrogate as well as nursing, discussions with consultants, evaluation of patient's response to treatment, examination of patient, obtaining history from patient or surrogate, ordering and performing treatments and interventions, ordering and review of laboratory studies, ordering and review of radiographic studies, pulse oximetry and re-evaluation of patient's condition.    Length of Stay: 3  Glori Bickers, MD  01/18/2020, 9:50 AM  Advanced Heart Failure Team Pager [phone] (M-F; 7a - 4p)  Please contact [facility] for night-coverage after hours (4p -7a ) and weekends on [email or web address]

## 2020-01-18 NOTE — Progress Notes (Signed)
Paged cardiologist on call regarding pt's potassium of 3.3 MD stated, "I am placing orders in right now."

## 2020-01-18 NOTE — Progress Notes (Signed)
IABP removed from rfa, manual pressure applied for 30 minutes. Site level 0, no s+s of hematoma. Tegaderm dressing applied, bedrest instructions given.  Bilateral dp pulses easily palpable.   Bedrest begins at 10:58:00

## 2020-01-18 NOTE — Progress Notes (Signed)
Paged cardiologist on call for medication to rest per pt request. MD placed orders for melatonin.

## 2020-01-19 ENCOUNTER — Inpatient Hospital Stay (HOSPITAL_COMMUNITY): Payer: Medicare HMO

## 2020-01-19 DIAGNOSIS — G4733 Obstructive sleep apnea (adult) (pediatric): Secondary | ICD-10-CM

## 2020-01-19 DIAGNOSIS — I4891 Unspecified atrial fibrillation: Secondary | ICD-10-CM

## 2020-01-19 DIAGNOSIS — I10 Essential (primary) hypertension: Secondary | ICD-10-CM

## 2020-01-19 LAB — CBC
HCT: 31.7 % — ABNORMAL LOW (ref 39.0–52.0)
Hemoglobin: 10.4 g/dL — ABNORMAL LOW (ref 13.0–17.0)
MCH: 33 pg (ref 26.0–34.0)
MCHC: 32.8 g/dL (ref 30.0–36.0)
MCV: 100.6 fL — ABNORMAL HIGH (ref 80.0–100.0)
Platelets: 133 10*3/uL — ABNORMAL LOW (ref 150–400)
RBC: 3.15 MIL/uL — ABNORMAL LOW (ref 4.22–5.81)
RDW: 14.2 % (ref 11.5–15.5)
WBC: 7.9 10*3/uL (ref 4.0–10.5)
nRBC: 0 % (ref 0.0–0.2)

## 2020-01-19 LAB — POCT I-STAT 7, (LYTES, BLD GAS, ICA,H+H)
Acid-Base Excess: 3 mmol/L — ABNORMAL HIGH (ref 0.0–2.0)
Bicarbonate: 26.2 mmol/L (ref 20.0–28.0)
Calcium, Ion: 1.19 mmol/L (ref 1.15–1.40)
HCT: 32 % — ABNORMAL LOW (ref 39.0–52.0)
Hemoglobin: 10.9 g/dL — ABNORMAL LOW (ref 13.0–17.0)
O2 Saturation: 85 %
Patient temperature: 98.2
Potassium: 4 mmol/L (ref 3.5–5.1)
Sodium: 137 mmol/L (ref 135–145)
TCO2: 27 mmol/L (ref 22–32)
pCO2 arterial: 35.2 mmHg (ref 32.0–48.0)
pH, Arterial: 7.479 — ABNORMAL HIGH (ref 7.350–7.450)
pO2, Arterial: 46 mmHg — ABNORMAL LOW (ref 83.0–108.0)

## 2020-01-19 LAB — GLUCOSE, CAPILLARY
Glucose-Capillary: 126 mg/dL — ABNORMAL HIGH (ref 70–99)
Glucose-Capillary: 114 mg/dL — ABNORMAL HIGH (ref 70–99)
Glucose-Capillary: 175 mg/dL — ABNORMAL HIGH (ref 70–99)
Glucose-Capillary: 188 mg/dL — ABNORMAL HIGH (ref 70–99)
Glucose-Capillary: 159 mg/dL — ABNORMAL HIGH (ref 70–99)

## 2020-01-19 LAB — COOXEMETRY PANEL
Carboxyhemoglobin: 1.7 % — ABNORMAL HIGH (ref 0.5–1.5)
Methemoglobin: 1.1 % (ref 0.0–1.5)
O2 Saturation: 73.5 %
Total hemoglobin: 10.7 g/dL — ABNORMAL LOW (ref 12.0–16.0)

## 2020-01-19 LAB — BASIC METABOLIC PANEL
Anion gap: 9 (ref 5–15)
BUN: 21 mg/dL (ref 8–23)
CO2: 26 mmol/L (ref 22–32)
Calcium: 8.7 mg/dL — ABNORMAL LOW (ref 8.9–10.3)
Chloride: 104 mmol/L (ref 98–111)
Creatinine, Ser: 1.2 mg/dL (ref 0.61–1.24)
GFR calc Af Amer: >60 mL/min (ref >60)
GFR calc non Af Amer: 56 mL/min — ABNORMAL LOW (ref >60)
Glucose, Bld: 122 mg/dL — ABNORMAL HIGH (ref 70–99)
Potassium: 4 mmol/L (ref 3.5–5.1)
Sodium: 139 mmol/L (ref 135–145)

## 2020-01-19 LAB — HEPARIN LEVEL (UNFRACTIONATED)
Heparin Unfractionated: 0.12 IU/mL — ABNORMAL LOW (ref 0.30–0.70)
Heparin Unfractionated: 0.17 IU/mL — ABNORMAL LOW (ref 0.30–0.70)

## 2020-01-19 LAB — MAGNESIUM: Magnesium: 2.1 mg/dL (ref 1.7–2.4)

## 2020-01-19 MED ORDER — METOLAZONE 5 MG PO TABS
5.0000 mg | ORAL_TABLET | Freq: Once | ORAL | Status: AC
  Administered 2020-01-19: 5 mg via ORAL
  Filled 2020-01-19: qty 1

## 2020-01-19 MED ORDER — FUROSEMIDE 10 MG/ML IJ SOLN
40.0000 mg | Freq: Once | INTRAMUSCULAR | Status: AC
  Administered 2020-01-19: 40 mg via INTRAVENOUS

## 2020-01-19 MED ORDER — FUROSEMIDE 10 MG/ML IJ SOLN
INTRAMUSCULAR | Status: AC
  Filled 2020-01-19: qty 4

## 2020-01-19 MED ORDER — MILRINONE LACTATE IN DEXTROSE 20-5 MG/100ML-% IV SOLN
0.2000 ug/kg/min | INTRAVENOUS | Status: DC
  Administered 2020-01-19 – 2020-01-22 (×5): 0.125 ug/kg/min via INTRAVENOUS
  Administered 2020-01-23 – 2020-01-29 (×10): 0.2 ug/kg/min via INTRAVENOUS
  Filled 2020-01-19 (×15): qty 100

## 2020-01-19 NOTE — Progress Notes (Signed)
   Progress Note  Patient Name: Daryl Rodriguez Date of Encounter: 01/19/2020  Primary Cardiologist: [name], MD  Contacted by nursing regarding increased oxygen requirement during the day also more crackles on examination.  Patient remains hemodynamically stable, in fact hypertensive, still in sinus rhythm on IV amiodarone.  He is no longer on milrinone and last co-oximetry was 73%. Creatinine 1.2 but urine output has dropped off compared to yesterday.  He was transitioned to oral Lasix today.  Plan to give Lasix 40 mg IV x1, follow-up chest x-ray tomorrow in comparison to last film.  Signed, Rozann Lesches, MD  01/19/2020, 3:00 PM

## 2020-01-19 NOTE — Progress Notes (Addendum)
Advanced Heart Failure Rounding Note  PCP-Cardiologist: [name], MD     Patient Profile   86 y/o male with h.o CAD, DM2, VT s/p ICD, HTN admitted with NSTEMI and acute systolic HF. Cath by Dr. [name] revealed severe 2v CAD in LD and LCX with borderline ostial LM lesion. Filling pressures markedly elevated with low output. Lactic acid up. IABP placed and milrinone started. Echo w/ EF 40-45%. Seen by TCTS yesterday and felt not to be surgical candidate   Subjective:    Was in AF with RVR on 5/14 and developed CP. Converted with IV amio.  Remains in NSR.  IABP removed yesterday.   Remains on milrinone 0.125, IV amio and IV heparin.   Denies CP or SOB. Co-ox 74%. CVP 3  Hgb stable at 10.4     Objective:   Weight Range: 93.4 kg Body mass index is 30.41 kg/m.   Vital Signs:   Temp:  [97.7 F (36.5 C)-99.5 F (37.5 C)] 99.5 F (37.5 C) (05/16 0300) Pulse Rate:  [68-218] 77 (05/16 0300) Resp:  [13-26] 19 (05/16 0300) BP: (142-151)/(51-67) 142/67 (05/15 0700) SpO2:  [91 %-99 %] 98 % (05/16 0300) Arterial Line BP: (116-278)/(41-268) 143/47 (05/16 0300) Last BM Date: (pta)  Weight change: Filed Weights   01/16/20 0600 01/22/2020 0500 01/18/20 0500  Weight: 93.9 kg 92.5 kg 93.4 kg    Intake/Output:   Intake/Output Summary (Last 24 hours) at 01/19/2020 0517 Last data filed at 01/19/2020 0100 Gross per 24 hour  Intake 653.77 ml  Output 1192 ml  Net -538.23 ml      Physical Exam   General:  Elderly male lying in bed. No resp difficulty HEENT: normal Neck: supple. no JVD. Carotids 2+ bilat; no bruits. No lymphadenopathy or thryomegaly appreciated. Cor: PMI nondisplaced. Regular rate & rhythm. No rubs, gallops or murmurs. Lungs: clear Abdomen: soft, nontender, nondistended. No hepatosplenomegaly. No bruits or masses. Good bowel sounds. Extremities: no cyanosis, clubbing, rash, edema RFA site looks good  Neuro: alert & orientedx3, cranial nerves grossly  intact. moves all 4 extremities w/o difficulty. Affect pleasant  Telemetry   NSR 60-70s Personally reviewed   Labs    CBC Recent Labs    01/18/20 0307 01/19/20 0429  WBC 9.6 7.9  HGB 10.7* 10.4*  HCT 32.7* 31.7*  MCV 102.2* 100.6*  PLT 140* Q000111Q*   Basic Metabolic Panel Recent Labs    01/15/2020 0411 01/31/2020 1010 01/18/20 0307  NA 140  --  137  K 3.7  --  3.3*  CL 104  --  100  CO2 25  --  27  GLUCOSE 130*  --  212*  BUN 25*  --  26*  CREATININE 1.50*  --  1.39*  CALCIUM 8.7*  --  8.5*  MG  --  2.0  --    Liver Function Tests No results for input(s): AST, ALT, ALKPHOS, BILITOT, PROT, ALBUMIN in the last 72 hours. No results for input(s): LIPASE, AMYLASE in the last 72 hours. Cardiac Enzymes No results for input(s): CKTOTAL, CKMB, CKMBINDEX, TROPONINI in the last 72 hours.  BNP: BNP (last 3 results) Recent Labs    01/10/2020 1403  BNP 597.8*    ProBNP (last 3 results) No results for input(s): PROBNP in the last 8760 hours.   D-Dimer No results for input(s): DDIMER in the last 72 hours. Hemoglobin A1C No results for input(s): HGBA1C in the last 72 hours. Fasting Lipid Panel No results for input(s): CHOL, HDL,  LDLCALC, TRIG, CHOLHDL, LDLDIRECT in the last 72 hours. Thyroid Function Tests No results for input(s): TSH, T4TOTAL, T3FREE, THYROIDAB in the last 72 hours.  Invalid input(s): FREET3  Other results:   Imaging    No results found.   Medications:     Scheduled Medications: . amLODipine  5 mg Oral Daily  . aspirin EC  81 mg Oral Daily  . atorvastatin  80 mg Oral q1800  . Chlorhexidine Gluconate Cloth  6 each Topical Daily  . Chlorhexidine Gluconate Cloth  6 each Topical Daily  . clopidogrel  75 mg Oral Daily  . docusate sodium  100 mg Oral Daily  . furosemide  40 mg Oral Daily  . gabapentin  300 mg Oral Daily  . gabapentin  600 mg Oral Daily  . insulin aspart  0-15 Units Subcutaneous Q4H  . iron polysaccharides  150 mg Oral Daily   . latanoprost  1 drop Both Eyes QHS  . mouth rinse  15 mL Mouth Rinse BID  . melatonin  3 mg Oral QHS  . potassium chloride  40 mEq Oral Daily  . sodium chloride flush  10-40 mL Intracatheter Q12H  . sodium chloride flush  3 mL Intravenous Q12H  . spironolactone  12.5 mg Oral Daily  . vitamin B-12  500 mcg Oral Daily  . Vitamin D (Ergocalciferol)  50,000 Units Oral Q Thu    Infusions: . sodium chloride    . sodium chloride    . sodium chloride    . sodium chloride Stopped (01/16/20 1752)  . amiodarone 30 mg/hr (01/19/20 0227)  . heparin 1,000 Units/hr (01/18/20 2000)  . milrinone 0.125 mcg/kg/min (01/18/20 2000)    PRN Medications: Place/Maintain arterial line **AND** sodium chloride, Place/Maintain arterial line **AND** sodium chloride, sodium chloride, sodium chloride, acetaminophen, nitroGLYCERIN, ondansetron (ZOFRAN) IV, sodium chloride flush, sodium chloride flush   Assessment/Plan   1. NSTEMI  -Known coronary disease. In 2019 had DES to RCA.  -LHC 02/03/2020 with multivessel disease including LM disease  - On ASA 81   - On atorvastatin 80 mg daily  - case reviewed extensively with Dr. [name] and Dr.Owen. Agree that best plan is for MV atherectomy/stenting including possible ostial LM early this week with Impella support - Stable after IABP removal 5/15 - holding b-blockers for now with shock on admit and need for IABP/inotropes. Can resume as tolerated - On DAPT. Will likely need Eliquis/Plavix on d/c - CR to see. Ok to ambulate  2. Cardiogenic Shock/Acute Systolic Heart Failure in the setting of NSTEMI - ICM. ECHO 2020 EF 55%---> EF today down 40-45%  - IABP placed in cath lab 5/12. Stable after IABP removal 5/15 - On milrione 0.125 mcg. CO-OX 74%. Will stop today and follow co-ox - Volume status stable. Now back on po diuretics.  3. Acute Hypoxic Respiratory Failure due to pulmonary edema - Sats in the ED  80s . Required Bipap.  - improved w/ diuresis  - Now on  [place] but will drop into high 80s off .  - Encourage IS and ambulation  4. Elevated Lactic Acid  - Lactic Acid on admit 2.5 2/2 cardiogenic shock >> resolved  5. OSA  - Uses CPAP at home  -  Continue at night.   6. H/O of VT - Has ICD - on PO amiodarone 200 mg daily PTA - now on IV amiodarone for rapid AFib - Keep K> 4.0 Mg > 2.0  7. AKI  - Creatinine baseline 1-1.2 -  Likely due to HF/shock - BMET pending   8. Afib w/ RVR - went into rapid afib on 5/14 - back in NSR on IV amio - continue IV amio. Continue heparin  9. Hypokalemia - await BMET    Length of Stay: 4  Glori Bickers, MD  01/19/2020, 5:17 AM  Advanced Heart Failure Team Pager [phone] (M-F; 7a - 4p)  Please contact [facility] for night-coverage after hours (4p -7a ) and weekends on [email or web address]

## 2020-01-19 NOTE — Progress Notes (Signed)
ANTICOAGULATION CONSULT NOTE  Pharmacy Consult for heparin Indication: chest pain/ACS  No Known Allergies  Patient Measurements: Height: 5\' 9"  (175.3 cm) Weight: 94.9 kg (209 lb 3.5 oz) IBW/kg (Calculated) : 70.7 Heparin Dosing Weight: 89 kg   Vital Signs: Temp: 98.2 F (36.8 C) (05/16 1613) Temp Source: Oral (05/16 1613) BP: 157/57 (05/16 1630) Pulse Rate: 75 (05/16 1700)  Labs: Recent Labs    01/26/2020 0411 01/11/2020 0705 01/18/20 0307 01/18/20 0307 01/19/20 0429 01/19/20 1622 01/19/20 1801  HGB 10.7*   < > 10.7*   < > 10.4* 10.9*  --   HCT 32.4*   < > 32.7*  --  31.7* 32.0*  --   PLT 149*  --  140*  --  133*  --   --   HEPARINUNFRC  --    < > 0.32  --  0.12*  --  0.17*  CREATININE 1.50*  --  1.39*  --  1.20  --   --    < > = values in this interval not displayed.    Estimated Creatinine Clearance: 53 mL/min (by C-G formula based on SCr of 1.2 mg/dL).   Medical History: Past Medical History:  Diagnosis Date  . Acute respiratory failure ([place])   . BPH (benign prostatic hyperplasia)   . Cardiac arrest ([place])   . Carpal tunnel syndrome   . CKD (chronic kidney disease)   . Diabetes ([place])   . DM II (diabetes mellitus, type II), controlled ([place])   . High risk medication use   . Hyperlipidemia   . Hypertension   . Low vitamin D level   . STEMI (ST elevation myocardial infarction) ([place])    01/22/18 PCI/DES to RCA  . Systolic heart failure ([place])   . VT (ventricular tachycardia) ([facility])     Medications:  Medications Prior to Admission  Medication Sig Dispense Refill Last Dose  . amiodarone (PACERONE) 200 MG tablet Take one tablet by mouth daily Monday through Saturday.  Do NOT take on Sunday. (Patient taking differently: Take 200 mg by mouth See admin instructions. Take 200 mg by mouth in the morning on Mon/Tues/Wed/Thurs/Fri/Sat and nothing on Sunday) 90 tablet 3 02/01/2020 at am  . amLODipine (NORVASC) 10 MG tablet Take 10 mg by mouth daily.    01/23/2020 at am  .  ammonium lactate (LAC-HYDRIN) 12 % lotion Apply 1 application topically 2 (two) times daily as needed (to affected areas of legs and hands).    01/25/2020 at Unknown time  . aspirin EC 81 MG tablet Take 81 mg by mouth daily.   01/20/2020 at 0800  . atorvastatin (LIPITOR) 80 MG tablet TAKE 1 TABLET BY MOUTH ONCE DAILY AT  6  PM (Patient taking differently: Take 80 mg by mouth See admin instructions. Take 80 mg by mouth at 5 PM daily) 90 tablet 1 01/14/2020 at pm  . clopidogrel (PLAVIX) 75 MG tablet Take 1 tablet by mouth once daily (Patient taking differently: Take 75 mg by mouth daily. ) 90 tablet 3 02/03/2020 at 0800  . FERREX 150 150 MG capsule TAKE 1 CAPSULE BY MOUTH ONCE DAILY (Patient taking differently: Take 150 mg by mouth daily. ) 90 capsule 3 01/14/2020 at am  . furosemide (LASIX) 40 MG tablet Take 1 tablet (40 mg total) by mouth daily. (Patient taking differently: Take 40 mg by mouth See admin instructions. Take 40 mg by mouth in the morning and 40 mg at 1:30 PM) 90 tablet 3 01/05/2020 at am  .  gabapentin (NEURONTIN) 300 MG capsule Take 300-600 mg by mouth See admin instructions. Take 300 mg by mouth in the morning and 600 mg at 5 PM  1 01/18/2020 at am  . glimepiride (AMARYL) 2 MG tablet Take 1 mg by mouth daily with breakfast.    01/08/2020 at am  . isosorbide mononitrate (IMDUR) 30 MG 24 hr tablet Take 1 tablet by mouth once daily (Patient taking differently: Take 30 mg by mouth in the morning. ) 90 tablet 0 01/25/2020 at am  . latanoprost (XALATAN) 0.005 % ophthalmic solution Place 1 drop into both eyes at bedtime.   01/14/2020 at pm  . losartan (COZAAR) 100 MG tablet Take 1 tablet (100 mg total) by mouth daily. (Patient taking differently: Take 100 mg by mouth See admin instructions. Take 100 mg by mouth at 5 PM daily) 90 tablet 3 01/14/2020 at pm  . metoprolol succinate (TOPROL-XL) 50 MG 24 hr tablet Take 1 tablet (50 mg total) by mouth daily. Take with or immediately following a meal. 90 tablet 3  01/17/2020 at 0800  . nitroGLYCERIN (NITROSTAT) 0.4 MG SL tablet Place 1 tablet (0.4 mg total) under the tongue every 5 (five) minutes x 3 doses as needed for chest pain. 25 tablet 1 01/19/2020 at Unknown time  . potassium chloride SA (KLOR-CON M20) 20 MEQ tablet Take 1 tablet (20 mEq total) by mouth daily. 30 tablet 3 01/18/2020 at am  . terazosin (HYTRIN) 1 MG capsule Take 1 mg by mouth See admin instructions. Take 1 mg by mouth at 5 PM daily   01/14/2020 at pm  . triamcinolone cream (KENALOG) 0.1 % Apply 1 application topically as needed (to itching areas of skin).    unk at unk  . vitamin B-12 (CYANOCOBALAMIN) 500 MCG tablet Take 500 mcg by mouth daily.   01/25/2020 at am  . Vitamin D, Ergocalciferol, (DRISDOL) 50000 units CAPS capsule Take 50,000 Units by mouth every Thursday.    01/09/2020 at Unknown time    Assessment: 76 YOM with elevated troponin to start IV heparin for ACS. H/H low. Plt wnl. Heparin 4000 units IV bolus given in the ED  Patient s/p left and right heart cath with IABP insertion. Pt also in new onset AF RVR.  IABP removed 5/15.  Heparin level 0.17 < goal on heparin drip 1100 uts/hr.  No bleeding or complications noted.  CBC stable.  Per RN IABP site looks okay. Plan to go back to cath lab 5/18  Goal of Therapy:  Heparin level 0.3-0.5 units/ml Monitor platelets by anticoagulation protocol: Yes   Plan:  -Increase IV heparin to 1250 units/hr. -Daily heparin level and CBC. -F/u plans for cath lab on Tues.   Bonnita Nasuti Pharm.D. CPP, BCPS Clinical Pharmacist [phone] 01/19/2020 7:58 PM

## 2020-01-19 NOTE — Progress Notes (Signed)
[place] for heparin Indication: chest pain/ACS  No Known Allergies  Patient Measurements: Height: 5\' 9"  (175.3 cm) Weight: 94.9 kg (209 lb 3.5 oz) IBW/kg (Calculated) : 70.7 Heparin Dosing Weight: 89 kg   Vital Signs: Temp: 99.5 F (37.5 C) (05/16 0300) Temp Source: Axillary (05/16 0300) Pulse Rate: 67 (05/16 0900)  Labs: Recent Labs    01/16/20 1145 01/31/2020 0411 01/18/2020 0411 01/05/2020 0705 01/18/20 0307 01/19/20 0429  HGB  --  10.7*   < >  --  10.7* 10.4*  HCT  --  32.4*  --   --  32.7* 31.7*  PLT  --  149*  --   --  140* 133*  HEPARINUNFRC   < >  --   --  0.24* 0.32 0.12*  CREATININE  --  1.50*  --   --  1.39* 1.20   < > = values in this interval not displayed.    Estimated Creatinine Clearance: 53 mL/min (by C-G formula based on SCr of 1.2 mg/dL).   Medical History: Past Medical History:  Diagnosis Date  . Acute respiratory failure ([place])   . BPH (benign prostatic hyperplasia)   . Cardiac arrest ([place])   . Carpal tunnel syndrome   . CKD (chronic kidney disease)   . Diabetes ([place])   . DM II (diabetes mellitus, type II), controlled ([place])   . High risk medication use   . Hyperlipidemia   . Hypertension   . Low vitamin D level   . STEMI (ST elevation myocardial infarction) ([place])    01/22/18 PCI/DES to RCA  . Systolic heart failure ([place])   . VT (ventricular tachycardia) ([facility])     Medications:  Medications Prior to Admission  Medication Sig Dispense Refill Last Dose  . amiodarone (PACERONE) 200 MG tablet Take one tablet by mouth daily Monday through Saturday.  Do NOT take on Sunday. (Patient taking differently: Take 200 mg by mouth See admin instructions. Take 200 mg by mouth in the morning on Mon/Tues/Wed/Thurs/Fri/Sat and nothing on Sunday) 90 tablet 3 01/05/2020 at am  . amLODipine (NORVASC) 10 MG tablet Take 10 mg by mouth daily.    01/11/2020 at am  . ammonium lactate (LAC-HYDRIN) 12 % lotion Apply 1 application  topically 2 (two) times daily as needed (to affected areas of legs and hands).    01/19/2020 at Unknown time  . aspirin EC 81 MG tablet Take 81 mg by mouth daily.   02/03/2020 at 0800  . atorvastatin (LIPITOR) 80 MG tablet TAKE 1 TABLET BY MOUTH ONCE DAILY AT  6  PM (Patient taking differently: Take 80 mg by mouth See admin instructions. Take 80 mg by mouth at 5 PM daily) 90 tablet 1 01/14/2020 at pm  . clopidogrel (PLAVIX) 75 MG tablet Take 1 tablet by mouth once daily (Patient taking differently: Take 75 mg by mouth daily. ) 90 tablet 3 01/29/2020 at 0800  . FERREX 150 150 MG capsule TAKE 1 CAPSULE BY MOUTH ONCE DAILY (Patient taking differently: Take 150 mg by mouth daily. ) 90 capsule 3 02/01/2020 at am  . furosemide (LASIX) 40 MG tablet Take 1 tablet (40 mg total) by mouth daily. (Patient taking differently: Take 40 mg by mouth See admin instructions. Take 40 mg by mouth in the morning and 40 mg at 1:30 PM) 90 tablet 3 01/11/2020 at am  . gabapentin (NEURONTIN) 300 MG capsule Take 300-600 mg by mouth See admin instructions. Take 300 mg by mouth  in the morning and 600 mg at 5 PM  1 01/14/2020 at am  . glimepiride (AMARYL) 2 MG tablet Take 1 mg by mouth daily with breakfast.    01/16/2020 at am  . isosorbide mononitrate (IMDUR) 30 MG 24 hr tablet Take 1 tablet by mouth once daily (Patient taking differently: Take 30 mg by mouth in the morning. ) 90 tablet 0 01/31/2020 at am  . latanoprost (XALATAN) 0.005 % ophthalmic solution Place 1 drop into both eyes at bedtime.   01/14/2020 at pm  . losartan (COZAAR) 100 MG tablet Take 1 tablet (100 mg total) by mouth daily. (Patient taking differently: Take 100 mg by mouth See admin instructions. Take 100 mg by mouth at 5 PM daily) 90 tablet 3 01/14/2020 at pm  . metoprolol succinate (TOPROL-XL) 50 MG 24 hr tablet Take 1 tablet (50 mg total) by mouth daily. Take with or immediately following a meal. 90 tablet 3 02/03/2020 at 0800  . nitroGLYCERIN (NITROSTAT) 0.4 MG SL tablet  Place 1 tablet (0.4 mg total) under the tongue every 5 (five) minutes x 3 doses as needed for chest pain. 25 tablet 1 01/21/2020 at Unknown time  . potassium chloride SA (KLOR-CON M20) 20 MEQ tablet Take 1 tablet (20 mEq total) by mouth daily. 30 tablet 3 01/31/2020 at am  . terazosin (HYTRIN) 1 MG capsule Take 1 mg by mouth See admin instructions. Take 1 mg by mouth at 5 PM daily   01/14/2020 at pm  . triamcinolone cream (KENALOG) 0.1 % Apply 1 application topically as needed (to itching areas of skin).    unk at unk  . vitamin B-12 (CYANOCOBALAMIN) 500 MCG tablet Take 500 mcg by mouth daily.   01/18/2020 at am  . Vitamin D, Ergocalciferol, (DRISDOL) 50000 units CAPS capsule Take 50,000 Units by mouth every Thursday.    01/09/2020 at Unknown time    Assessment: 89 YOM with elevated troponin to start IV heparin for ACS. H/H low. Plt wnl. Heparin 4000 units IV bolus given in the ED  Patient now s/p left and right heart cath with IABP insertion. Pt also in new onset AF RVR.  IABP removed 5/15.  Heparin level below goal this AM.  No bleeding or complications noted.  CBC stable.  Per RN IABP site looks okay.  Goal of Therapy:  Heparin level 0.3-0.5 units/ml Monitor platelets by anticoagulation protocol: Yes   Plan:  -Increase IV heparin to 1100 units/hr. -Check heparin level 8 hrs. -Daily heparin level and CBC. -F/u plans for cath lab on Tues.  Marguerite Olea, [facility] Clinical Pharmacist Phone [phone]  01/19/2020 10:23 AM

## 2020-01-19 NOTE — Progress Notes (Signed)
RT called to patient room due to patient having a decrease in oxygen sats.  Upon arrival to room, patient was on [place] high flow nasal cannula with sats of 87% with a good waveform.  Attempted to place patient on home CPAP with 10L bled in however sats only improved to 89-90%.  Obtained ABG and MD notified.  Placed patient on bipap and is currently tolerating well.  Will continue to monitor.    Ref. Range 01/19/2020 16:22  Sample type Unknown ARTERIAL  pH, Arterial Latest Ref Range: 7.350 - 7.450  7.479 (H)  pCO2 arterial Latest Ref Range: 32.0 - 48.0 mmHg 35.2  pO2, Arterial Latest Ref Range: 83.0 - 108.0 mmHg 46 (L)  TCO2 Latest Ref Range: 22 - 32 mmol/L 27  Acid-Base Excess Latest Ref Range: 0.0 - 2.0 mmol/L 3.0 (H)  Bicarbonate Latest Ref Range: 20.0 - 28.0 mmol/L 26.2  O2 Saturation Latest Units: % 85.0  Patient temperature Unknown 98.2 F  Collection site Unknown art line

## 2020-01-19 NOTE — Plan of Care (Signed)
Contacted MD Domenic Polite in regard to pt's decline in oxygen saturation on 10-15L/min HFNC. Switched pt to home CPAP machine w/o relief. Upon auscultation, pt found to have fine crackles, not previously heard in this mornings assessment. VO given for x1 dose of lasix 40mg  IV push and f/u CXR in the AM. Will continue to monitor and assess.

## 2020-01-20 ENCOUNTER — Inpatient Hospital Stay (HOSPITAL_COMMUNITY): Payer: Medicare HMO

## 2020-01-20 DIAGNOSIS — R570 Cardiogenic shock: Secondary | ICD-10-CM

## 2020-01-20 LAB — COOXEMETRY PANEL
Carboxyhemoglobin: 1.5 % (ref 0.5–1.5)
Methemoglobin: 1.2 % (ref 0.0–1.5)
O2 Saturation: 78.5 %
Total hemoglobin: 11.8 g/dL — ABNORMAL LOW (ref 12.0–16.0)

## 2020-01-20 LAB — BASIC METABOLIC PANEL
Anion gap: 11 (ref 5–15)
BUN: 21 mg/dL (ref 8–23)
CO2: 26 mmol/L (ref 22–32)
Calcium: 8.6 mg/dL — ABNORMAL LOW (ref 8.9–10.3)
Chloride: 98 mmol/L (ref 98–111)
Creatinine, Ser: 1.29 mg/dL — ABNORMAL HIGH (ref 0.61–1.24)
GFR calc Af Amer: 59 mL/min — ABNORMAL LOW (ref >60)
GFR calc non Af Amer: 51 mL/min — ABNORMAL LOW (ref >60)
Glucose, Bld: 149 mg/dL — ABNORMAL HIGH (ref 70–99)
Potassium: 3.6 mmol/L (ref 3.5–5.1)
Sodium: 135 mmol/L (ref 135–145)

## 2020-01-20 LAB — URINALYSIS, ROUTINE W REFLEX MICROSCOPIC
Bilirubin Urine: NEGATIVE
Glucose, UA: NEGATIVE mg/dL
Ketones, ur: NEGATIVE mg/dL
Nitrite: NEGATIVE
Protein, ur: 100 mg/dL — AB
RBC / HPF: >50 RBC/hpf — ABNORMAL HIGH (ref 0–5)
Specific Gravity, Urine: 1.01 (ref 1.005–1.030)
WBC, UA: >50 WBC/hpf — ABNORMAL HIGH (ref 0–5)
pH: 5 (ref 5.0–8.0)

## 2020-01-20 LAB — CULTURE, BLOOD (ROUTINE X 2)
Culture: NO GROWTH
Culture: NO GROWTH
Special Requests: ADEQUATE
Special Requests: ADEQUATE

## 2020-01-20 LAB — GLUCOSE, CAPILLARY
Glucose-Capillary: 141 mg/dL — ABNORMAL HIGH (ref 70–99)
Glucose-Capillary: 153 mg/dL — ABNORMAL HIGH (ref 70–99)
Glucose-Capillary: 162 mg/dL — ABNORMAL HIGH (ref 70–99)
Glucose-Capillary: 168 mg/dL — ABNORMAL HIGH (ref 70–99)
Glucose-Capillary: 189 mg/dL — ABNORMAL HIGH (ref 70–99)
Glucose-Capillary: 200 mg/dL — ABNORMAL HIGH (ref 70–99)
Glucose-Capillary: 232 mg/dL — ABNORMAL HIGH (ref 70–99)
Glucose-Capillary: 235 mg/dL — ABNORMAL HIGH (ref 70–99)

## 2020-01-20 LAB — HEPARIN LEVEL (UNFRACTIONATED): Heparin Unfractionated: 0.37 IU/mL (ref 0.30–0.70)

## 2020-01-20 LAB — CBC
HCT: 34.4 % — ABNORMAL LOW (ref 39.0–52.0)
Hemoglobin: 11.5 g/dL — ABNORMAL LOW (ref 13.0–17.0)
MCH: 33.3 pg (ref 26.0–34.0)
MCHC: 33.4 g/dL (ref 30.0–36.0)
MCV: 99.7 fL (ref 80.0–100.0)
Platelets: 153 10*3/uL (ref 150–400)
RBC: 3.45 MIL/uL — ABNORMAL LOW (ref 4.22–5.81)
RDW: 14 % (ref 11.5–15.5)
WBC: 11.2 10*3/uL — ABNORMAL HIGH (ref 4.0–10.5)
nRBC: 0 % (ref 0.0–0.2)

## 2020-01-20 MED ORDER — POTASSIUM CHLORIDE CRYS ER 20 MEQ PO TBCR
40.0000 meq | EXTENDED_RELEASE_TABLET | Freq: Once | ORAL | Status: AC
  Administered 2020-01-20: 40 meq via ORAL
  Filled 2020-01-20: qty 2

## 2020-01-20 MED ORDER — SODIUM CHLORIDE 0.9 % IV SOLN: INTRAVENOUS | Status: DC

## 2020-01-20 MED ORDER — SODIUM CHLORIDE 0.9% FLUSH: 3.0000 mL | INTRAVENOUS | Status: DC | PRN

## 2020-01-20 MED ORDER — FUROSEMIDE 10 MG/ML IJ SOLN
80.0000 mg | Freq: Once | INTRAMUSCULAR | Status: AC
  Administered 2020-01-20: 80 mg via INTRAVENOUS
  Filled 2020-01-20: qty 8

## 2020-01-20 MED ORDER — SODIUM CHLORIDE 0.9 % IV SOLN
1.0000 g | INTRAVENOUS | Status: DC
  Administered 2020-01-20 – 2020-01-23 (×4): 1 g via INTRAVENOUS
  Filled 2020-01-20 (×3): qty 1
  Filled 2020-01-20: qty 10
  Filled 2020-01-20: qty 1

## 2020-01-20 MED ORDER — VANCOMYCIN HCL 1250 MG/250ML IV SOLN
1250.0000 mg | INTRAVENOUS | Status: DC
  Administered 2020-01-20 – 2020-01-23 (×4): 1250 mg via INTRAVENOUS
  Filled 2020-01-20 (×5): qty 250

## 2020-01-20 MED ORDER — SODIUM CHLORIDE 0.9% FLUSH
3.0000 mL | Freq: Two times a day (BID) | INTRAVENOUS | Status: DC
  Administered 2020-01-20 – 2020-01-23 (×4): 3 mL via INTRAVENOUS

## 2020-01-20 MED ORDER — SODIUM CHLORIDE 0.9 % IV SOLN: 250.0000 mL | INTRAVENOUS | Status: DC | PRN

## 2020-01-20 NOTE — Progress Notes (Signed)
Pharmacy Antibiotic Note  Daryl Rodriguez is a 86 y.o. male admitted on 01/20/2020 with ACS. Planned PCI in am - had fever 101 last pm, WBC elevated 11.9, UA + BCx drawn - plan to use short term broad spectrum ABX until cx data back.  Pharmacy has been consulted for Rocephin and Vancomycin dosing.  Plan: Rocephin 1gm q24h Vancomycin 1250mg  q24h  Height: 5\' 9"  (175.3 cm) Weight: 94.1 kg (207 lb 7.3 oz) IBW/kg (Calculated) : 70.7  Temp (24hrs), Avg:99.3 F (37.4 C), Min:98.2 F (36.8 C), Max:101.5 F (38.6 C)  Recent Labs  Lab 01/23/2020 1404 01/22/2020 1550 01/31/2020 2230 01/16/20 0429 01/16/20 0429 01/16/20 2141 01/22/2020 0411 01/18/20 0307 01/19/20 0429 01/20/20 0452  WBC  --   --    < > 9.5  --   --  8.8 9.6 7.9 11.2*  CREATININE  --   --    < > 1.49*   < > 1.40* 1.50* 1.39* 1.20 1.29*  LATICACIDVEN 2.5* 1.7  --   --   --   --   --   --   --   --    < > = values in this interval not displayed.    Estimated Creatinine Clearance: 49.2 mL/min (A) (by C-G formula based on SCr of 1.29 mg/dL (H)).    No Known Allergies  Antimicrobials this admission:   Dose adjustments this admission:   Microbiology results:    Bonnita Nasuti Pharm.D. CPP, BCPS Clinical Pharmacist [phone] 01/20/2020 4:33 PM

## 2020-01-20 NOTE — Progress Notes (Addendum)
Advanced Heart Failure Rounding Note  PCP-Cardiologist: [name], MD     Patient Profile   86 y/o male with h.o CAD, DM2, VT s/p ICD, HTN admitted with NSTEMI and acute systolic HF. Cath by Dr. [name] revealed severe 2v CAD in LD and LCX with borderline ostial LM lesion. Filling pressures markedly elevated with low output. Lactic acid up. IABP placed and milrinone started. Echo w/ EF 40-45%. Seen by TCTS and felt not to be surgical candidate. Plan is for staged PCI.   Subjective:    Was in AF with RVR on 5/14 and developed CP. Converted with IV amio.  Remains in NSR.   IABP removed 5/15  Attempted milrinone wean yesterday, however shortly after discontinuation, he became acutely SOB w/ crackles on examination and hypertension, CXR concerning for early flash pulmonary edema. Milrinone restarted at 0.125 and IV lasix given.     Good UOP, -2.9 L out yesterday. CXR improved. Breathing improved. CVP 3.   Co-ox 79%.   No dyspnea this morning. Denies CP.   WBC up 7.9>>11.2. mTemp overnight 101.5. Had chills yesterday. Fever broke w/ Tylenol. Has Foley Cath.     Objective:   Weight Range: 94.1 kg Body mass index is 30.64 kg/m.   Vital Signs:   Temp:  [97.5 F (36.4 C)-101.5 F (38.6 C)] 98.6 F (37 C) (05/17 0400) Pulse Rate:  [65-93] 93 (05/17 0700) Resp:  [14-29] 16 (05/17 0700) BP: (108-157)/(50-57) 108/50 (05/17 0620) SpO2:  [81 %-100 %] 93 % (05/17 0700) Arterial Line BP: (124-171)/(46-58) 171/54 (05/17 0700) FiO2 (%):  [100 %] 100 % (05/16 1630) Weight:  [94.1 kg] 94.1 kg (05/17 0546) Last BM Date: 01/19/20  Weight change: Filed Weights   01/18/20 0500 01/19/20 0500 01/20/20 0546  Weight: 93.4 kg 94.9 kg 94.1 kg    Intake/Output:   Intake/Output Summary (Last 24 hours) at 01/20/2020 0751 Last data filed at 01/20/2020 0546 Gross per 24 hour  Intake 1018.25 ml  Output 2930 ml  Net -1911.75 ml      Physical Exam   CVP 3 General:  Well  appearing elderly WM sitting up in chair. No respiratory difficulty HEENT: normal  Neck: supple. no JVD. Carotids 2+ bilat; no bruits. No lymphadenopathy or thyromegaly appreciated. Cor: PMI nondisplaced. Regular rate & rhythm. No rubs, gallops or murmurs. Lungs: clear, no wheezing  Abdomen: soft, nontender, nondistended. No hepatosplenomegaly. No bruits or masses. Good bowel sounds. Extremities: no cyanosis, clubbing, rash, trace bilateral edema + RUE PICC Neuro: alert & oriented x 3, cranial nerves grossly intact. moves all 4 extremities w/o difficulty. Affect pleasant. GU: + Foley, urine clear    Telemetry   NSR 80s Personally reviewed   Labs    CBC Recent Labs    01/19/20 0429 01/19/20 0429 01/19/20 1622 01/20/20 0452  WBC 7.9  --   --  11.2*  HGB 10.4*   < > 10.9* 11.5*  HCT 31.7*   < > 32.0* 34.4*  MCV 100.6*  --   --  99.7  PLT 133*  --   --  153   < > = values in this interval not displayed.   Basic Metabolic Panel Recent Labs    01/22/2020 1010 01/18/20 0307 01/19/20 0429 01/19/20 0429 01/19/20 1622 01/20/20 0452  NA  --    < > 139   < > 137 135  K  --    < > 4.0   < > 4.0 3.6  CL  --    < >  104  --   --  98  CO2  --    < > 26  --   --  26  GLUCOSE  --    < > 122*  --   --  149*  BUN  --    < > 21  --   --  21  CREATININE  --    < > 1.20  --   --  1.29*  CALCIUM  --    < > 8.7*  --   --  8.6*  MG 2.0  --  2.1  --   --   --    < > = values in this interval not displayed.   Liver Function Tests No results for input(s): AST, ALT, ALKPHOS, BILITOT, PROT, ALBUMIN in the last 72 hours. No results for input(s): LIPASE, AMYLASE in the last 72 hours. Cardiac Enzymes No results for input(s): CKTOTAL, CKMB, CKMBINDEX, TROPONINI in the last 72 hours.  BNP: BNP (last 3 results) Recent Labs    01/14/2020 1403  BNP 597.8*    ProBNP (last 3 results) No results for input(s): PROBNP in the last 8760 hours.   D-Dimer No results for input(s): DDIMER in the last  72 hours. Hemoglobin A1C No results for input(s): HGBA1C in the last 72 hours. Fasting Lipid Panel No results for input(s): CHOL, HDL, LDLCALC, TRIG, CHOLHDL, LDLDIRECT in the last 72 hours. Thyroid Function Tests No results for input(s): TSH, T4TOTAL, T3FREE, THYROIDAB in the last 72 hours.  Invalid input(s): FREET3  Other results:   Imaging    DG CHEST PORT 1 VIEW  Result Date: 01/19/2020 CLINICAL DATA:  Increasing hypoxia EXAM: PORTABLE CHEST 1 VIEW COMPARISON:  01/15/2019 FINDINGS: Cardiac shadow is enlarged. Defibrillator is again noted and stable. Aortic calcifications are seen. Patchy increasing parenchymal edema is noted particularly in the left mid lung. Right-sided PICC line is again noted and stable. Intra-aortic balloon pump has been removed in the interval. IMPRESSION: Increasing pulmonary edema. Electronically Signed   By: Inez Catalina M.D.   On: 01/19/2020 17:00     Medications:     Scheduled Medications: . amLODipine  5 mg Oral Daily  . aspirin EC  81 mg Oral Daily  . atorvastatin  80 mg Oral q1800  . Chlorhexidine Gluconate Cloth  6 each Topical Daily  . Chlorhexidine Gluconate Cloth  6 each Topical Daily  . clopidogrel  75 mg Oral Daily  . docusate sodium  100 mg Oral Daily  . furosemide  40 mg Oral Daily  . gabapentin  300 mg Oral Daily  . gabapentin  600 mg Oral Daily  . insulin aspart  0-15 Units Subcutaneous Q4H  . iron polysaccharides  150 mg Oral Daily  . latanoprost  1 drop Both Eyes QHS  . mouth rinse  15 mL Mouth Rinse BID  . melatonin  3 mg Oral QHS  . potassium chloride  40 mEq Oral Daily  . sodium chloride flush  10-40 mL Intracatheter Q12H  . sodium chloride flush  3 mL Intravenous Q12H  . spironolactone  12.5 mg Oral Daily  . vitamin B-12  500 mcg Oral Daily  . Vitamin D (Ergocalciferol)  50,000 Units Oral Q Thu    Infusions: . sodium chloride    . sodium chloride    . sodium chloride    . sodium chloride Stopped (01/16/20 1752)  .  amiodarone 30 mg/hr (01/20/20 0132)  . heparin 1,250 Units/hr (01/20/20 0000)  . milrinone  0.125 mcg/kg/min (01/20/20 0000)    PRN Medications: Place/Maintain arterial line **AND** sodium chloride, Place/Maintain arterial line **AND** sodium chloride, sodium chloride, sodium chloride, acetaminophen, nitroGLYCERIN, ondansetron (ZOFRAN) IV, sodium chloride flush, sodium chloride flush   Assessment/Plan   1. NSTEMI  -Known coronary disease. In 2019 had DES to RCA.  -LHC 02/01/2020 with multivessel disease including LM disease  - On ASA 81   - On atorvastatin 80 mg daily  - case reviewed extensively with Dr. [name] and Dr.Owen. Agree that best plan is for MV atherectomy/stenting including possible ostial LM early this week with Impella support - Stable after IABP removal 5/15. No active CP - holding b-blockers for now with shock on admit and need for IABP/inotropes. Can resume as tolerated - On DAPT. Will likely need Eliquis/Plavix on d/c - CR to see. Ok to ambulate  2. Cardiogenic Shock/Acute Systolic Heart Failure in the setting of NSTEMI - ICM. ECHO 2020 EF 55%---> EF now down 40-45%  - IABP placed in cath lab 5/12. Stable after IABP removal 5/15 - On milrione 0.125 mcg. CO-OX 79%.  - Developed recurrent pulmonary edema 5/18, improved w/ IV Lasix - Remains mildly volume overloaded, will give another dose of IV 80 mg   3. Acute Hypoxic Respiratory Failure due to pulmonary edema - Sats in the ED  80s . Required Bipap.  - improved w/ diuresis  - Now on [place] but will drop into high 80s off [place].  - Encourage IS and ambulation  4. Elevated Lactic Acid  - Lactic Acid on admit 2.5 2/2 cardiogenic shock >> resolved  5. OSA  - Uses CPAP at home  -  Continue at night.   6. H/O of VT - Has ICD - on PO amiodarone 200 mg daily PTA - now on IV amiodarone for rapid AFib - Keep K> 4.0 Mg > 2.0  7. AKI  - Creatinine baseline 1-1.2 - Likely due to HF/shock - Scr 1.29 today   8. Afib  w/ RVR - went into rapid afib on 5/14 - back in NSR on IV amio - continue IV amio. Continue heparin  9. Hypokalemia - K 3.6, supp w/ KCl. Discussed dosing w/ pharmacy  11. Leukocytosis  - WBC increase 7.9>>11.2 - mTemp overnight 101.2 - CXR today w/o infiltrate - has foley. Will remove and check UA and culture - check blood cultures - start Vanc + Cefepime after cultures drawn  - follow CBC closely     Length of Stay: 5  Brittainy Simmons, PA-C  01/20/2020, 7:51 AM  Advanced Heart Failure Team Pager [phone] (M-F; [place])  Please contact [facility] for night-coverage after hours (4p -7a ) and weekends on [email or web address]   Agree with above.   Developed recurrent pulmonary edema yesterday with desaturations. Treated with BIPAP and re-initiation of milrinone + IV lasix. Respiratory status now better. CXR cleared. Developed fever to 101 overnight. UA+. No CP.   General:  Elderly male sitting in bed.  No resp difficulty HEENT: normal Neck: supple. no JVD. Carotids 2+ bilat; no bruits. No lymphadenopathy or thryomegaly appreciated. Cor: PMI nondisplaced. Regular rate & rhythm. No rubs, gallops or murmurs. Lungs: clear Abdomen: soft, nontender, nondistended. No hepatosplenomegaly. No bruits or masses. Good bowel sounds. Extremities: no cyanosis, clubbing, rash, edema Neuro: alert & orientedx3, cranial nerves grossly intact. moves all 4 extremities w/o difficulty. Affect pleasant  Pulmonary edema improved with milrinone and IV lasix. Co-ox ox. CVP down. Will continue milrinone for MV PCI tomorrow.  Has fever with + UA. Remove Foley. Treat with vanc/ceftriaxone for now. Get [place].   If afebrile can proceed with MV PCI tomorrow with Impella support. Procedure explained in detail along with high-risk nature and risks including kidney failure, stroke, MI, vascular damage and death. Will need adequate titration of HF meds/diuretics prior to d/c home.   CRITICAL CARE Performed by:  Glori Bickers  Total critical care time: 35 minutes  Critical care time was exclusive of separately billable procedures and treating other patients.  Critical care was necessary to treat or prevent imminent or life-threatening deterioration.  Critical care was time spent personally by me (independent of midlevel providers or residents) on the following activities: development of treatment plan with patient and/or surrogate as well as nursing, discussions with consultants, evaluation of patient's response to treatment, examination of patient, obtaining history from patient or surrogate, ordering and performing treatments and interventions, ordering and review of laboratory studies, ordering and review of radiographic studies, pulse oximetry and re-evaluation of patient's condition.  Glori Bickers, MD  10:36 PM

## 2020-01-20 NOTE — Progress Notes (Signed)
[place] for heparin Indication: chest pain/ACS  No Known Allergies  Patient Measurements: Height: 5\' 9"  (175.3 cm) Weight: 94.1 kg (207 lb 7.3 oz) IBW/kg (Calculated) : 70.7 Heparin Dosing Weight: 89 kg   Vital Signs: Temp: 98.2 F (36.8 C) (05/17 0817) Temp Source: Oral (05/17 0400) BP: 108/50 (05/17 0620) Pulse Rate: 93 (05/17 0700)  Labs: Recent Labs    01/18/20 0307 01/18/20 0307 01/19/20 0429 01/19/20 0429 01/19/20 1622 01/19/20 1801 01/20/20 0452  HGB 10.7*   < > 10.4*   < > 10.9*  --  11.5*  HCT 32.7*   < > 31.7*  --  32.0*  --  34.4*  PLT 140*  --  133*  --   --   --  153  HEPARINUNFRC 0.32   < > 0.12*  --   --  0.17* 0.37  CREATININE 1.39*  --  1.20  --   --   --  1.29*   < > = values in this interval not displayed.    Estimated Creatinine Clearance: 49.2 mL/min (A) (by C-G formula based on SCr of 1.29 mg/dL (H)).   Medical History: Past Medical History:  Diagnosis Date  . Acute respiratory failure ([place])   . BPH (benign prostatic hyperplasia)   . Cardiac arrest ([place])   . Carpal tunnel syndrome   . CKD (chronic kidney disease)   . Diabetes ([place])   . DM II (diabetes mellitus, type II), controlled ([place])   . High risk medication use   . Hyperlipidemia   . Hypertension   . Low vitamin D level   . STEMI (ST elevation myocardial infarction) ([place])    01/22/18 PCI/DES to RCA  . Systolic heart failure ([place])   . VT (ventricular tachycardia) ([facility])     Medications:  Medications Prior to Admission  Medication Sig Dispense Refill Last Dose  . amiodarone (PACERONE) 200 MG tablet Take one tablet by mouth daily Monday through Saturday.  Do NOT take on Sunday. (Patient taking differently: Take 200 mg by mouth See admin instructions. Take 200 mg by mouth in the morning on Mon/Tues/Wed/Thurs/Fri/Sat and nothing on Sunday) 90 tablet 3 01/13/2020 at am  . amLODipine (NORVASC) 10 MG tablet Take 10 mg by mouth daily.    01/18/2020  at am  . ammonium lactate (LAC-HYDRIN) 12 % lotion Apply 1 application topically 2 (two) times daily as needed (to affected areas of legs and hands).    01/25/2020 at Unknown time  . aspirin EC 81 MG tablet Take 81 mg by mouth daily.   02/02/2020 at 0800  . atorvastatin (LIPITOR) 80 MG tablet TAKE 1 TABLET BY MOUTH ONCE DAILY AT  6  PM (Patient taking differently: Take 80 mg by mouth See admin instructions. Take 80 mg by mouth at 5 PM daily) 90 tablet 1 01/14/2020 at pm  . clopidogrel (PLAVIX) 75 MG tablet Take 1 tablet by mouth once daily (Patient taking differently: Take 75 mg by mouth daily. ) 90 tablet 3 01/08/2020 at 0800  . FERREX 150 150 MG capsule TAKE 1 CAPSULE BY MOUTH ONCE DAILY (Patient taking differently: Take 150 mg by mouth daily. ) 90 capsule 3 01/14/2020 at am  . furosemide (LASIX) 40 MG tablet Take 1 tablet (40 mg total) by mouth daily. (Patient taking differently: Take 40 mg by mouth See admin instructions. Take 40 mg by mouth in the morning and 40 mg at 1:30 PM) 90 tablet 3 01/11/2020 at am  .  gabapentin (NEURONTIN) 300 MG capsule Take 300-600 mg by mouth See admin instructions. Take 300 mg by mouth in the morning and 600 mg at 5 PM  1 01/05/2020 at am  . glimepiride (AMARYL) 2 MG tablet Take 1 mg by mouth daily with breakfast.    01/14/2020 at am  . isosorbide mononitrate (IMDUR) 30 MG 24 hr tablet Take 1 tablet by mouth once daily (Patient taking differently: Take 30 mg by mouth in the morning. ) 90 tablet 0 01/04/2020 at am  . latanoprost (XALATAN) 0.005 % ophthalmic solution Place 1 drop into both eyes at bedtime.   01/14/2020 at pm  . losartan (COZAAR) 100 MG tablet Take 1 tablet (100 mg total) by mouth daily. (Patient taking differently: Take 100 mg by mouth See admin instructions. Take 100 mg by mouth at 5 PM daily) 90 tablet 3 01/14/2020 at pm  . metoprolol succinate (TOPROL-XL) 50 MG 24 hr tablet Take 1 tablet (50 mg total) by mouth daily. Take with or immediately following a meal. 90  tablet 3 02/03/2020 at 0800  . nitroGLYCERIN (NITROSTAT) 0.4 MG SL tablet Place 1 tablet (0.4 mg total) under the tongue every 5 (five) minutes x 3 doses as needed for chest pain. 25 tablet 1 01/13/2020 at Unknown time  . potassium chloride SA (KLOR-CON M20) 20 MEQ tablet Take 1 tablet (20 mEq total) by mouth daily. 30 tablet 3 01/25/2020 at am  . terazosin (HYTRIN) 1 MG capsule Take 1 mg by mouth See admin instructions. Take 1 mg by mouth at 5 PM daily   01/14/2020 at pm  . triamcinolone cream (KENALOG) 0.1 % Apply 1 application topically as needed (to itching areas of skin).    unk at unk  . vitamin B-12 (CYANOCOBALAMIN) 500 MCG tablet Take 500 mcg by mouth daily.   01/19/2020 at am  . Vitamin D, Ergocalciferol, (DRISDOL) 50000 units CAPS capsule Take 50,000 Units by mouth every Thursday.    01/09/2020 at Unknown time    Assessment: 57 YOM with elevated troponin to start IV heparin for ACS. H/H low. Plt wnl. Heparin 4000 units IV bolus given in the ED  Patient s/p left and right heart cath with IABP insertion. Pt also in new onset AF RVR.  IABP removed 5/15.  Heparin level 0.37 this am at goal on heparin drip 1250 uts/hr. - RN noted peripheral IV had infiltrated so all heparin may not have been in vein - will drop heparin drip slightly since now running in CL - will need peripheral lab sticks for accurate HL  No bleeding or complications noted.  CBC stable.  Per RN IABP site looks okay. Plan to go back to cath lab 5/18  Goal of Therapy:  Heparin level 0.3-0.5 units/ml Monitor platelets by anticoagulation protocol: Yes   Plan:  Decrease IV heparin to 1200 units/hr. -Daily heparin level and CBC. -F/u plans for cath lab on Tues.   Bonnita Nasuti Pharm.D. CPP, BCPS Clinical Pharmacist [phone] 01/20/2020 8:28 AM

## 2020-01-20 NOTE — Progress Notes (Signed)
Came to ambulate however pt just back to bed, tired. Sts he will walk later with nursing. Will f/u tomorrow. [place], ACSM 2:10 PM 01/20/2020

## 2020-01-20 NOTE — Evaluation (Signed)
Physical Therapy Evaluation Patient Details Name: Daryl Rodriguez MRN: [id number] DOB: 10/13/1933 Today's Date: 01/20/2020   History of Present Illness  Patient is a 86 y/o male who presents with chest pain and SOB. Admitted with NSTEMI, acute respiratory failure and acute pulmonary edema from CHF. Plan is for staged PCI 01/21/20.PMH includes HTN, HLD, DM2, CKD III, PVD, wrist surgery, back surgery, VT, systolic heart failure  Clinical Impression  Patient presents with generalized weakness, impaired balance, decreased activity tolerance and impaired mobility s/p above. Pt reports being independent and lives with wife PTA. Will have support of granddaughter at d/c. Used quad cane for community ambulation. Today, pt tolerated transfers and gait training with Min A-min guard for balance/safety and use of RW. VSS on 3L/min 02 [place], Sp02 >92%. Noted to have 2/4 DOE. Will follow up post procedure to see if pt has any further needs prior to return home.     Follow Up Recommendations Home health PT;Supervision for mobility/OOB    Equipment Recommendations  None recommended by PT    Recommendations for Other Services       Precautions / Restrictions Precautions Precautions: Fall Restrictions Weight Bearing Restrictions: No      Mobility  Bed Mobility               General bed mobility comments: Up in chair upon PT arrival.  Transfers Overall transfer level: Needs assistance Equipment used: Rolling walker (2 wheeled) Transfers: Sit to/from Stand Sit to Stand: Min guard;Min assist         General transfer comment: Min A progressing to Min guard assist to stand; cues for hand placement.  Ambulation/Gait Ambulation/Gait assistance: Min guard Gait Distance (Feet): 540 Feet Assistive device: Rolling walker (2 wheeled) Gait Pattern/deviations: Step-through pattern;Decreased stride length;Drifts right/left   Gait velocity interpretation: 1.31 - 2.62 ft/sec, indicative of limited  community ambulator General Gait Details: Slow, mostly steady gait with listing to left at times; no overt LOB. 2/4 DOE. Pt on 3L/min 02 [place] and Sp02 remained >92%. 1 LOB when pt trying to turn around to say something quickly to granddaughter, but able to catch self.  Stairs            Wheelchair Mobility    Modified Rankin (Stroke Patients Only)       Balance Overall balance assessment: Needs assistance Sitting-balance support: Feet supported;No upper extremity supported Sitting balance-Leahy Scale: Good Sitting balance - Comments: supervision   Standing balance support: During functional activity Standing balance-Leahy Scale: Poor Standing balance comment: Requires UE support for dynamic tasks.                             Pertinent Vitals/Pain Pain Assessment: No/denies pain    Home Living Family/patient expects to be discharged to:: Private residence Living Arrangements: Spouse/significant other;Other relatives(granddaughter will stay until he recovers) Available Help at Discharge: Family;Available 24 hours/day Type of Home: House Home Access: Stairs to enter Entrance Stairs-Rails: Psychiatric nurse of Steps: 2 Home Layout: One level Home Equipment: Cane - quad;Shower seat - built in;Walker - 4 wheels      Prior Function Level of Independence: Independent         Comments: Loves to play music (guitar); works in his shop, does some repair jobs. Used quad cane for walks around the block. No falls recently. Granddaughter will be staying with him post d/c.     Hand Dominance   Dominant Hand: Right  Extremity/Trunk Assessment   Upper Extremity Assessment Upper Extremity Assessment: Defer to OT evaluation    Lower Extremity Assessment Lower Extremity Assessment: Generalized weakness(but functional)       Communication   Communication: No difficulties  Cognition Arousal/Alertness: Awake/alert Behavior During Therapy: WFL  for tasks assessed/performed Overall Cognitive Status: Within Functional Limits for tasks assessed                                        General Comments General comments (skin integrity, edema, etc.): Granddaughter, [name], present during session.    Exercises     Assessment/Plan    PT Assessment Patient needs continued PT services  PT Problem List Decreased strength;Decreased mobility;Decreased balance;Cardiopulmonary status limiting activity;Decreased activity tolerance       PT Treatment Interventions Therapeutic activities;Gait training;Therapeutic exercise;Patient/family education;Balance training;Functional mobility training;Stair training    PT Goals (Current goals can be found in the Care Plan section)  Acute Rehab PT Goals Patient Stated Goal: go home PT Goal Formulation: With patient Time For Goal Achievement: 02/03/20 Potential to Achieve Goals: Good    Frequency Min 3X/week   Barriers to discharge        Co-evaluation               AM-PAC PT "6 Clicks" Mobility  Outcome Measure Help needed turning from your back to your side while in a flat bed without using bedrails?: A Little Help needed moving from lying on your back to sitting on the side of a flat bed without using bedrails?: A Little Help needed moving to and from a bed to a chair (including a wheelchair)?: A Little Help needed standing up from a chair using your arms (e.g., wheelchair or bedside chair)?: A Little Help needed to walk in hospital room?: A Little Help needed climbing 3-5 steps with a railing? : A Little 6 Click Score: 18    End of Session Equipment Utilized During Treatment: Gait belt Activity Tolerance: Patient tolerated treatment well Patient left: Other (comment);with family/visitor present(left on BSC per RN for bath) Nurse Communication: Mobility status PT Visit Diagnosis: Muscle weakness (generalized) (M62.81);Unsteadiness on feet (R26.81);Difficulty in  walking, not elsewhere classified (R26.2)    Time: 0902-0930 PT Time Calculation (min) (ACUTE ONLY): 28 min   Charges:   PT Evaluation $PT Eval Moderate Complexity: 1 Mod PT Treatments $Gait Training: 8-22 mins        Marisa Severin, PT, DPT Acute Rehabilitation Services Pager [phone] Office [place] 01/20/2020, 11:45 AM

## 2020-01-20 NOTE — Progress Notes (Signed)
Pt placed on his home CPAP unit with 2L of 02 bleed in.  Pt tolerating well.

## 2020-01-21 LAB — COOXEMETRY PANEL
Carboxyhemoglobin: 1.2 % (ref 0.5–1.5)
Methemoglobin: 0.7 % (ref 0.0–1.5)
O2 Saturation: 78.7 %
Total hemoglobin: 10.4 g/dL — ABNORMAL LOW (ref 12.0–16.0)

## 2020-01-21 LAB — BASIC METABOLIC PANEL
Anion gap: 10 (ref 5–15)
BUN: 26 mg/dL — ABNORMAL HIGH (ref 8–23)
CO2: 27 mmol/L (ref 22–32)
Calcium: 8.8 mg/dL — ABNORMAL LOW (ref 8.9–10.3)
Chloride: 97 mmol/L — ABNORMAL LOW (ref 98–111)
Creatinine, Ser: 1.48 mg/dL — ABNORMAL HIGH (ref 0.61–1.24)
GFR calc Af Amer: 50 mL/min — ABNORMAL LOW (ref >60)
GFR calc non Af Amer: 43 mL/min — ABNORMAL LOW (ref >60)
Glucose, Bld: 165 mg/dL — ABNORMAL HIGH (ref 70–99)
Potassium: 3.6 mmol/L (ref 3.5–5.1)
Sodium: 134 mmol/L — ABNORMAL LOW (ref 135–145)

## 2020-01-21 LAB — CBC
HCT: 32 % — ABNORMAL LOW (ref 39.0–52.0)
Hemoglobin: 10.8 g/dL — ABNORMAL LOW (ref 13.0–17.0)
MCH: 33.6 pg (ref 26.0–34.0)
MCHC: 33.8 g/dL (ref 30.0–36.0)
MCV: 99.7 fL (ref 80.0–100.0)
Platelets: 161 10*3/uL (ref 150–400)
RBC: 3.21 MIL/uL — ABNORMAL LOW (ref 4.22–5.81)
RDW: 14.2 % (ref 11.5–15.5)
WBC: 16.8 10*3/uL — ABNORMAL HIGH (ref 4.0–10.5)
nRBC: 0 % (ref 0.0–0.2)

## 2020-01-21 LAB — GLUCOSE, CAPILLARY
Glucose-Capillary: 152 mg/dL — ABNORMAL HIGH (ref 70–99)
Glucose-Capillary: 142 mg/dL — ABNORMAL HIGH (ref 70–99)
Glucose-Capillary: 190 mg/dL — ABNORMAL HIGH (ref 70–99)
Glucose-Capillary: 216 mg/dL — ABNORMAL HIGH (ref 70–99)
Glucose-Capillary: 213 mg/dL — ABNORMAL HIGH (ref 70–99)

## 2020-01-21 LAB — HEPARIN LEVEL (UNFRACTIONATED): Heparin Unfractionated: 0.43 IU/mL (ref 0.30–0.70)

## 2020-01-21 MED ORDER — POTASSIUM CHLORIDE CRYS ER 20 MEQ PO TBCR
40.0000 meq | EXTENDED_RELEASE_TABLET | Freq: Once | ORAL | Status: AC
  Administered 2020-01-21: 40 meq via ORAL
  Filled 2020-01-21: qty 2

## 2020-01-21 MED ORDER — AMIODARONE LOAD VIA INFUSION
150.0000 mg | Freq: Once | INTRAVENOUS | Status: AC
  Administered 2020-01-21: 150 mg via INTRAVENOUS
  Filled 2020-01-21: qty 83.34

## 2020-01-21 MED ORDER — POTASSIUM CHLORIDE CRYS ER 20 MEQ PO TBCR
20.0000 meq | EXTENDED_RELEASE_TABLET | Freq: Once | ORAL | Status: AC
  Administered 2020-01-21: 20 meq via ORAL
  Filled 2020-01-21: qty 1

## 2020-01-21 MED ORDER — TAMSULOSIN HCL 0.4 MG PO CAPS
0.4000 mg | ORAL_CAPSULE | Freq: Every day | ORAL | Status: DC
  Administered 2020-01-21 – 2020-01-29 (×9): 0.4 mg via ORAL
  Filled 2020-01-21 (×9): qty 1

## 2020-01-21 NOTE — Progress Notes (Addendum)
Advanced Heart Failure Rounding Note  PCP-Cardiologist: [name], MD     Patient Profile   86 y/o male with h.o CAD, DM2, VT s/p ICD, HTN admitted with NSTEMI and acute systolic HF. Cath by Dr. [name] revealed severe 2v CAD in LD and LCX with borderline ostial LM lesion. Filling pressures markedly elevated with low output. Lactic acid up. IABP placed and milrinone started. Echo w/ EF 40-45%. Seen by TCTS and felt not to be surgical candidate. Plan is for staged PCI.   Subjective:    5/14 was in AF w/ RVR and developed CP. Converted w/ IV amio 5/15 IABP removed 5/16 developed recurrent pulmonary edema, improved w/ IV Lasix 5/17 found to have UTI. Foley removed. Cultures pending. On broad spectrum abx, Vanc + ceftriaxone. mTemp past 24 hrs 101.6. CBC pending.   5/18 back in Afib, in the setting of UTI. Rebolused w/ Amio==>conversion back to NSR  Remains on IV amiodarone and 0.125 of milrinone. Co-ox 79%.   Sitting up in bed. Family at bedside. No complaints. Denies CP. No dyspnea. Denies dysuria, subjective fever and chills.     Objective:   Weight Range: 92.3 kg Body mass index is 30.05 kg/m.   Vital Signs:   Temp:  [98.1 F (36.7 C)-101.6 F (38.7 C)] 99.3 F (37.4 C) (05/18 0337) Pulse Rate:  [75-122] 105 (05/18 0630) Resp:  [14-27] 15 (05/18 0630) BP: (88-157)/(52-88) 112/74 (05/18 0630) SpO2:  [92 %-99 %] 98 % (05/18 0630) Weight:  [92.3 kg] 92.3 kg (05/18 0500) Last BM Date: 01/19/20  Weight change: Filed Weights   01/19/20 0500 01/20/20 0546 01/21/20 0500  Weight: 94.9 kg 94.1 kg 92.3 kg    Intake/Output:   Intake/Output Summary (Last 24 hours) at 01/21/2020 0759 Last data filed at 01/21/2020 0445 Gross per 24 hour  Intake 1332.3 ml  Output 2435 ml  Net -1102.7 ml      Physical Exam   CVP 5 General:  Well appearing elderly WM sitting up in bed No respiratory difficulty HEENT: normal  Neck: supple. JVD 5 cm. Carotids 2+ bilat; no  bruits. No lymphadenopathy or thyromegaly appreciated. Cor: PMI nondisplaced. RRR. No rubs, gallops or murmurs. Lungs: CTAB, no wheezing   Abdomen: soft, nontender, nondistended. No hepatosplenomegaly. No bruits or masses. Good bowel sounds. Extremities: no cyanosis, clubbing, rash, no edema + RUE PICC Neuro: alert & oriented x 3, cranial nerves grossly intact. moves all 4 extremities w/o difficulty. Affect pleasant.    Telemetry   Was in afib earlier this am, back in NSR currently after bolus of IV amiodarone. Rates 80s Personally reviewed   Labs    CBC Recent Labs    01/19/20 0429 01/19/20 0429 01/19/20 1622 01/20/20 0452  WBC 7.9  --   --  11.2*  HGB 10.4*   < > 10.9* 11.5*  HCT 31.7*   < > 32.0* 34.4*  MCV 100.6*  --   --  99.7  PLT 133*  --   --  153   < > = values in this interval not displayed.   Basic Metabolic Panel Recent Labs    01/19/20 0429 01/19/20 1622 01/20/20 0452 01/21/20 0442  NA 139   < > 135 134*  K 4.0   < > 3.6 3.6  CL 104  --  98 97*  CO2 26  --  26 27  GLUCOSE 122*  --  149* 165*  BUN 21  --  21 26*  CREATININE 1.20  --  1.29* 1.48*  CALCIUM 8.7*  --  8.6* 8.8*  MG 2.1  --   --   --    < > = values in this interval not displayed.   Liver Function Tests No results for input(s): AST, ALT, ALKPHOS, BILITOT, PROT, ALBUMIN in the last 72 hours. No results for input(s): LIPASE, AMYLASE in the last 72 hours. Cardiac Enzymes No results for input(s): CKTOTAL, CKMB, CKMBINDEX, TROPONINI in the last 72 hours.  BNP: BNP (last 3 results) Recent Labs    01/17/2020 1403  BNP 597.8*    ProBNP (last 3 results) No results for input(s): PROBNP in the last 8760 hours.   D-Dimer No results for input(s): DDIMER in the last 72 hours. Hemoglobin A1C No results for input(s): HGBA1C in the last 72 hours. Fasting Lipid Panel No results for input(s): CHOL, HDL, LDLCALC, TRIG, CHOLHDL, LDLDIRECT in the last 72 hours. Thyroid Function Tests No results  for input(s): TSH, T4TOTAL, T3FREE, THYROIDAB in the last 72 hours.  Invalid input(s): FREET3  Other results:   Imaging    No results found.   Medications:     Scheduled Medications: . amiodarone  150 mg Intravenous Once  . amLODipine  5 mg Oral Daily  . aspirin EC  81 mg Oral Daily  . atorvastatin  80 mg Oral q1800  . Chlorhexidine Gluconate Cloth  6 each Topical Daily  . Chlorhexidine Gluconate Cloth  6 each Topical Daily  . clopidogrel  75 mg Oral Daily  . docusate sodium  100 mg Oral Daily  . furosemide  40 mg Oral Daily  . gabapentin  300 mg Oral Daily  . gabapentin  600 mg Oral Daily  . insulin aspart  0-15 Units Subcutaneous Q4H  . iron polysaccharides  150 mg Oral Daily  . latanoprost  1 drop Both Eyes QHS  . mouth rinse  15 mL Mouth Rinse BID  . melatonin  3 mg Oral QHS  . potassium chloride  40 mEq Oral Daily  . potassium chloride  40 mEq Oral Once  . sodium chloride flush  10-40 mL Intracatheter Q12H  . sodium chloride flush  3 mL Intravenous Q12H  . sodium chloride flush  3 mL Intravenous Q12H  . spironolactone  12.5 mg Oral Daily  . tamsulosin  0.4 mg Oral Daily  . vitamin B-12  500 mcg Oral Daily  . Vitamin D (Ergocalciferol)  50,000 Units Oral Q Thu    Infusions: . sodium chloride    . sodium chloride    . sodium chloride    . sodium chloride Stopped (01/16/20 1752)  . sodium chloride    . sodium chloride    . amiodarone 60 mg/hr (01/21/20 0755)  . cefTRIAXone (ROCEPHIN)  IV Stopped (01/20/20 1430)  . heparin 1,200 Units/hr (01/20/20 1900)  . milrinone 0.125 mcg/kg/min (01/20/20 2138)  . vancomycin Stopped (01/20/20 1616)    PRN Medications: Place/Maintain arterial line **AND** sodium chloride, Place/Maintain arterial line **AND** sodium chloride, sodium chloride, sodium chloride, sodium chloride, acetaminophen, nitroGLYCERIN, ondansetron (ZOFRAN) IV, sodium chloride flush, sodium chloride flush, sodium chloride flush   Assessment/Plan    1. NSTEMI  -Known coronary disease. In 2019 had DES to RCA.  -LHC 01/06/2020 with multivessel disease including LM disease  - On ASA 81   - On atorvastatin 80 mg daily  - case reviewed extensively with Dr. [name] and Dr.Owen. Agree that best plan is for MV atherectomy/stenting including possible ostial LM early this week with Impella support -  Stable after IABP removal 5/15. No active CP - holding b-blockers for now with shock on admit and need for IABP/inotropes. Can resume as tolerated - On DAPT. Will likely need Eliquis/Plavix on d/c - CR to see. Ok to ambulate  2. Cardiogenic Shock/Acute Systolic Heart Failure in the setting of NSTEMI - ICM. ECHO 2020 EF 55%---> EF now down 40-45%  - IABP placed in cath lab 5/12. Stable after IABP removal 5/15 - On milrione 0.125 mcg. CO-OX 79%.  - Developed recurrent pulmonary edema 5/18, improved w/ IV Lasix - CVP 5 today  - Continue PO lasix 40 mg qd  3. Acute Hypoxic Respiratory Failure due to pulmonary edema - Sats in the ED  80s . Required Bipap.  - improved w/ diuresis  - O2 sats 94% on RA.  - Encourage IS and ambulation  4. Elevated Lactic Acid  - Lactic Acid on admit 2.5 2/2 cardiogenic shock >> resolved  5. OSA  - Uses CPAP at home  -  Continue at night.   6. H/O of VT - Has ICD - on PO amiodarone 200 mg daily PTA - now on IV amiodarone for rapid AFib - Keep K> 4.0 Mg > 2.0  7. AKI  - Creatinine baseline 1-1.2 - Likely due to HF/shock - Scr 1.48 today   8. Afib w/ RVR - went into rapid afib on 5/14. Converted w/IV amio - now w/ PAF in setting of UTI. Back in NSR after amio rebolus - Increase IV amio gtt to 60 mg/hr - Continue heparin   9. Hypokalemia - K 3.6, supp w/ KCl. Discussed dosing w/ pharmacy  11. UTI - 5/17 UA +. Cultures pending - continue vanc + ceftriaxone  - foley removed, now w/ voiding difficulty. Add Flomax     Length of Stay: [address] PA-C  01/21/2020, 7:59 AM  Advanced  Heart Failure Team Pager [phone] (M-F; [place])  Please contact [facility] for night-coverage after hours (4p -7a ) and weekends on [email or web address]  Patient seen and examined with the above-signed Advanced Practice Provider and/or Housestaff. I personally reviewed laboratory data, imaging studies and relevant notes. I independently examined the patient and formulated the important aspects of the plan. I have edited the note to reflect any of my changes or salient points. I have personally discussed the plan with the patient and/or family.  He remains on milrinone. Back in AF today. Also febrile to 101. UA positive. Co-ox stable at 78%. CVP low. No CP. Creatinine up to 1.5. Struggling with urinary retention.   General:  Elderly male. No resp difficulty HEENT: normal Neck: supple. no JVD. Carotids 2+ bilat; no bruits. No lymphadenopathy or thryomegaly appreciated. Cor: PMI nondisplaced. Irregular rate & rhythm. No rubs, gallops or murmurs. Lungs: clear Abdomen: soft, nontender, nondistended. No hepatosplenomegaly. No bruits or masses. Good bowel sounds. Extremities: no cyanosis, clubbing, rash, edema Neuro: alert & orientedx3, cranial nerves grossly intact. moves all 4 extremities w/o difficulty. Affect pleasant  Initially planned for MV PCI/atherectomy with Impella support today but with recurrent AF, AKI and fevers I do not think he is ready. Will continue IV amio and abx. Hold diuretics. Start Flomax for urinary retention. Ambulate. Reassess in am but likely won't be ready until later in the week. D/w Dr. Dow Adolph, MD  8:49 PM

## 2020-01-21 NOTE — Progress Notes (Signed)
ANTICOAGULATION CONSULT NOTE  Pharmacy Consult for heparin Indication: chest pain/ACS/Afib  No Known Allergies  Patient Measurements: Height: 5\' 9"  (175.3 cm) Weight: 92.3 kg (203 lb 7.8 oz) IBW/kg (Calculated) : 70.7 Heparin Dosing Weight: 89 kg   Vital Signs: Temp: 99.3 F (37.4 C) (05/18 0337) Temp Source: Axillary (05/18 0337) BP: 122/53 (05/18 1000) Pulse Rate: 71 (05/18 1000)  Labs: Recent Labs    01/19/20 0429 01/19/20 0429 01/19/20 1622 01/19/20 1622 01/19/20 1801 01/20/20 0452 01/21/20 0442 01/21/20 0957  HGB 10.4*   < > 10.9*   < >  --  11.5*  --  10.8*  HCT 31.7*   < > 32.0*  --   --  34.4*  --  32.0*  PLT 133*  --   --   --   --  153  --  161  HEPARINUNFRC 0.12*   < >  --   --  0.17* 0.37 0.43  --   CREATININE 1.20  --   --   --   --  1.29* 1.48*  --    < > = values in this interval not displayed.    Estimated Creatinine Clearance: 42.4 mL/min (A) (by C-G formula based on SCr of 1.48 mg/dL (H)).   Medical History: Past Medical History:  Diagnosis Date  . Acute respiratory failure ([place])   . BPH (benign prostatic hyperplasia)   . Cardiac arrest ([place])   . Carpal tunnel syndrome   . CKD (chronic kidney disease)   . Diabetes ([place])   . DM II (diabetes mellitus, type II), controlled ([place])   . High risk medication use   . Hyperlipidemia   . Hypertension   . Low vitamin D level   . STEMI (ST elevation myocardial infarction) ([place])    01/22/18 PCI/DES to RCA  . Systolic heart failure ([place])   . VT (ventricular tachycardia) ([facility])     Medications:  Medications Prior to Admission  Medication Sig Dispense Refill Last Dose  . amiodarone (PACERONE) 200 MG tablet Take one tablet by mouth daily Monday through Saturday.  Do NOT take on Sunday. (Patient taking differently: Take 200 mg by mouth See admin instructions. Take 200 mg by mouth in the morning on Mon/Tues/Wed/Thurs/Fri/Sat and nothing on Sunday) 90 tablet 3 02/03/2020 at am  . amLODipine (NORVASC) 10 MG  tablet Take 10 mg by mouth daily.    01/09/2020 at am  . ammonium lactate (LAC-HYDRIN) 12 % lotion Apply 1 application topically 2 (two) times daily as needed (to affected areas of legs and hands).    01/06/2020 at Unknown time  . aspirin EC 81 MG tablet Take 81 mg by mouth daily.   01/18/2020 at 0800  . atorvastatin (LIPITOR) 80 MG tablet TAKE 1 TABLET BY MOUTH ONCE DAILY AT  6  PM (Patient taking differently: Take 80 mg by mouth See admin instructions. Take 80 mg by mouth at 5 PM daily) 90 tablet 1 01/14/2020 at pm  . clopidogrel (PLAVIX) 75 MG tablet Take 1 tablet by mouth once daily (Patient taking differently: Take 75 mg by mouth daily. ) 90 tablet 3 01/28/2020 at 0800  . FERREX 150 150 MG capsule TAKE 1 CAPSULE BY MOUTH ONCE DAILY (Patient taking differently: Take 150 mg by mouth daily. ) 90 capsule 3 01/12/2020 at am  . furosemide (LASIX) 40 MG tablet Take 1 tablet (40 mg total) by mouth daily. (Patient taking differently: Take 40 mg by mouth See admin instructions. Take 40 mg by  mouth in the morning and 40 mg at 1:30 PM) 90 tablet 3 01/07/2020 at am  . gabapentin (NEURONTIN) 300 MG capsule Take 300-600 mg by mouth See admin instructions. Take 300 mg by mouth in the morning and 600 mg at 5 PM  1 01/22/2020 at am  . glimepiride (AMARYL) 2 MG tablet Take 1 mg by mouth daily with breakfast.    02/02/2020 at am  . isosorbide mononitrate (IMDUR) 30 MG 24 hr tablet Take 1 tablet by mouth once daily (Patient taking differently: Take 30 mg by mouth in the morning. ) 90 tablet 0 01/29/2020 at am  . latanoprost (XALATAN) 0.005 % ophthalmic solution Place 1 drop into both eyes at bedtime.   01/14/2020 at pm  . losartan (COZAAR) 100 MG tablet Take 1 tablet (100 mg total) by mouth daily. (Patient taking differently: Take 100 mg by mouth See admin instructions. Take 100 mg by mouth at 5 PM daily) 90 tablet 3 01/14/2020 at pm  . metoprolol succinate (TOPROL-XL) 50 MG 24 hr tablet Take 1 tablet (50 mg total) by mouth daily.  Take with or immediately following a meal. 90 tablet 3 02/02/2020 at 0800  . nitroGLYCERIN (NITROSTAT) 0.4 MG SL tablet Place 1 tablet (0.4 mg total) under the tongue every 5 (five) minutes x 3 doses as needed for chest pain. 25 tablet 1 01/26/2020 at Unknown time  . potassium chloride SA (KLOR-CON M20) 20 MEQ tablet Take 1 tablet (20 mEq total) by mouth daily. 30 tablet 3 01/20/2020 at am  . terazosin (HYTRIN) 1 MG capsule Take 1 mg by mouth See admin instructions. Take 1 mg by mouth at 5 PM daily   01/14/2020 at pm  . triamcinolone cream (KENALOG) 0.1 % Apply 1 application topically as needed (to itching areas of skin).    unk at unk  . vitamin B-12 (CYANOCOBALAMIN) 500 MCG tablet Take 500 mcg by mouth daily.   01/31/2020 at am  . Vitamin D, Ergocalciferol, (DRISDOL) 50000 units CAPS capsule Take 50,000 Units by mouth every Thursday.    01/09/2020 at Unknown time    Assessment: 26 YOM with elevated troponin to start IV heparin for ACS. H/H low. Plt wnl. Heparin 4000 units IV bolus given in the ED  Patient s/p left and right heart cath with IABP insertion. Pt also in new onset AF RVR.  IABP removed 5/15.  Heparin level 0.4  this am at goal on heparin drip 1200 uts/hr -  running in CL - will need peripheral lab sticks for accurate HL  No bleeding or complications noted.  CBC stable.  Per RN IABP site looks okay. Plan to go back to cath lab this week once improved from infection standpoint  Goal of Therapy:  Heparin level 0.3-0.5 units/ml Monitor platelets by anticoagulation protocol: Yes   Plan:   IV heparin 1200 units/hr. -Daily heparin level and CBC. -F/u plans for cath lab    [place].D. CPP, BCPS Clinical Pharmacist [phone] 01/21/2020 11:01 AM

## 2020-01-22 LAB — BASIC METABOLIC PANEL
Anion gap: 13 (ref 5–15)
BUN: 26 mg/dL — ABNORMAL HIGH (ref 8–23)
CO2: 23 mmol/L (ref 22–32)
Calcium: 8.3 mg/dL — ABNORMAL LOW (ref 8.9–10.3)
Chloride: 93 mmol/L — ABNORMAL LOW (ref 98–111)
Creatinine, Ser: 1.38 mg/dL — ABNORMAL HIGH (ref 0.61–1.24)
GFR calc Af Amer: 54 mL/min — ABNORMAL LOW (ref >60)
GFR calc non Af Amer: 47 mL/min — ABNORMAL LOW (ref >60)
Glucose, Bld: 323 mg/dL — ABNORMAL HIGH (ref 70–99)
Potassium: 3.6 mmol/L (ref 3.5–5.1)
Sodium: 129 mmol/L — ABNORMAL LOW (ref 135–145)

## 2020-01-22 LAB — CBC
HCT: 27.9 % — ABNORMAL LOW (ref 39.0–52.0)
HCT: 30.7 % — ABNORMAL LOW (ref 39.0–52.0)
Hemoglobin: 9.2 g/dL — ABNORMAL LOW (ref 13.0–17.0)
Hemoglobin: 10.3 g/dL — ABNORMAL LOW (ref 13.0–17.0)
MCH: 32.6 pg (ref 26.0–34.0)
MCH: 33.2 pg (ref 26.0–34.0)
MCHC: 33 g/dL (ref 30.0–36.0)
MCHC: 33.6 g/dL (ref 30.0–36.0)
MCV: 98.9 fL (ref 80.0–100.0)
MCV: 99 fL (ref 80.0–100.0)
Platelets: 161 10*3/uL (ref 150–400)
Platelets: 195 10*3/uL (ref 150–400)
RBC: 2.82 MIL/uL — ABNORMAL LOW (ref 4.22–5.81)
RBC: 3.1 MIL/uL — ABNORMAL LOW (ref 4.22–5.81)
RDW: 14.1 % (ref 11.5–15.5)
RDW: 14.1 % (ref 11.5–15.5)
WBC: 13.7 10*3/uL — ABNORMAL HIGH (ref 4.0–10.5)
WBC: 11.1 10*3/uL — ABNORMAL HIGH (ref 4.0–10.5)
nRBC: 0 % (ref 0.0–0.2)
nRBC: 0 % (ref 0.0–0.2)

## 2020-01-22 LAB — COOXEMETRY PANEL
Carboxyhemoglobin: 1.6 % — ABNORMAL HIGH (ref 0.5–1.5)
Methemoglobin: 1.1 % (ref 0.0–1.5)
O2 Saturation: 78 %
Total hemoglobin: 9.6 g/dL — ABNORMAL LOW (ref 12.0–16.0)

## 2020-01-22 LAB — ABO/RH: ABO/RH(D): O POS

## 2020-01-22 LAB — GLUCOSE, CAPILLARY
Glucose-Capillary: 154 mg/dL — ABNORMAL HIGH (ref 70–99)
Glucose-Capillary: 154 mg/dL — ABNORMAL HIGH (ref 70–99)
Glucose-Capillary: 161 mg/dL — ABNORMAL HIGH (ref 70–99)
Glucose-Capillary: 168 mg/dL — ABNORMAL HIGH (ref 70–99)
Glucose-Capillary: 186 mg/dL — ABNORMAL HIGH (ref 70–99)
Glucose-Capillary: 226 mg/dL — ABNORMAL HIGH (ref 70–99)

## 2020-01-22 LAB — TYPE AND SCREEN
ABO/RH(D): O POS
Antibody Screen: NEGATIVE

## 2020-01-22 LAB — HEPARIN LEVEL (UNFRACTIONATED): Heparin Unfractionated: 0.38 IU/mL (ref 0.30–0.70)

## 2020-01-22 MED ORDER — FUROSEMIDE 10 MG/ML IJ SOLN
40.0000 mg | Freq: Once | INTRAMUSCULAR | Status: AC
  Administered 2020-01-22: 40 mg via INTRAVENOUS
  Filled 2020-01-22: qty 4

## 2020-01-22 NOTE — H&P (View-Only) (Signed)
Advanced Heart Failure Rounding Note  PCP-Cardiologist: [name], MD     Patient Profile   86 y/o male with h.o CAD, DM2, VT s/p ICD, HTN admitted with NSTEMI and acute systolic HF. Cath by Dr. [name] revealed severe 2v CAD in LD and LCX with borderline ostial LM lesion. Filling pressures markedly elevated with low output. Lactic acid up. IABP placed and milrinone started. Echo w/ EF 40-45%. Seen by TCTS and felt not to be surgical candidate. Plan is for staged PCI.   Subjective:    5/14 was in AF w/ RVR and developed CP. Converted w/ IV amio 5/15 IABP removed 5/16 developed recurrent pulmonary edema, improved w/ IV Lasix 5/17 found to have UTI. Foley removed. Cultures pending. On broad spectrum abx, Vanc + ceftriaxone. mTemp past 24 hrs 101.6. CBC pending.   5/18 back in Afib, in the setting of UTI. Rebolused w/ Amio==>conversion back to NSR  Remains on IV abx for UTI. Now AF. On milrinone 0.125 -> co-ox 78%   Remaining in NSR on IV amio. Creatinine improved.   On heparin. Hgb dropping 11.5 -> 10.8 -> 9.2. No obvious bleeding.   Feels like he has fluid on board.   Objective:   Weight Range: 95.1 kg Body mass index is 30.96 kg/m.   Vital Signs:   Temp:  [97.6 F (36.4 C)-98.7 F (37.1 C)] 97.6 F (36.4 C) (05/19 1120) Pulse Rate:  [65-80] 65 (05/19 1300) Resp:  [15-35] 20 (05/19 1300) BP: (93-149)/(52-67) 134/57 (05/19 1300) SpO2:  [87 %-98 %] 98 % (05/19 1300) Weight:  [95.1 kg] 95.1 kg (05/19 0455) Last BM Date: 01/21/20  Weight change: Filed Weights   01/20/20 0546 01/21/20 0500 01/22/20 0455  Weight: 94.1 kg 92.3 kg 95.1 kg    Intake/Output:   Intake/Output Summary (Last 24 hours) at 01/22/2020 1343 Last data filed at 01/22/2020 1300 Gross per 24 hour  Intake 2200.88 ml  Output 780 ml  Net 1420.88 ml      Physical Exam   General:  Sitting in chair. No resp difficulty HEENT: normal Neck: supple. JVP 8-9 Carotids 2+ bilat; no bruits. No  lymphadenopathy or thryomegaly appreciated. Cor: PMI nondisplaced. Regular rate & rhythm. No rubs, gallops or murmurs. Lungs: clear Abdomen: soft, nontender, nondistended. No hepatosplenomegaly. No bruits or masses. Good bowel sounds. Extremities: no cyanosis, clubbing, rash, tr-1+  edema Neuro: alert & orientedx3, cranial nerves grossly intact. moves all 4 extremities w/o difficulty. Affect pleasan   Telemetry   NSR 60s Personally reviewed   Labs    CBC Recent Labs    01/21/20 0957 01/22/20 0433  WBC 16.8* 13.7*  HGB 10.8* 9.2*  HCT 32.0* 27.9*  MCV 99.7 98.9  PLT 161 [id number]   Basic Metabolic Panel Recent Labs    01/21/20 0442 01/22/20 0433  NA 134* 129*  K 3.6 3.6  CL 97* 93*  CO2 27 23  GLUCOSE 165* 323*  BUN 26* 26*  CREATININE 1.48* 1.38*  CALCIUM 8.8* 8.3*   Liver Function Tests No results for input(s): AST, ALT, ALKPHOS, BILITOT, PROT, ALBUMIN in the last 72 hours. No results for input(s): LIPASE, AMYLASE in the last 72 hours. Cardiac Enzymes No results for input(s): CKTOTAL, CKMB, CKMBINDEX, TROPONINI in the last 72 hours.  BNP: BNP (last 3 results) Recent Labs    01/04/2020 1403  BNP 597.8*    ProBNP (last 3 results) No results for input(s): PROBNP in the last 8760 hours.   D-Dimer No results  for input(s): DDIMER in the last 72 hours. Hemoglobin A1C No results for input(s): HGBA1C in the last 72 hours. Fasting Lipid Panel No results for input(s): CHOL, HDL, LDLCALC, TRIG, CHOLHDL, LDLDIRECT in the last 72 hours. Thyroid Function Tests No results for input(s): TSH, T4TOTAL, T3FREE, THYROIDAB in the last 72 hours.  Invalid input(s): FREET3  Other results:   Imaging    No results found.   Medications:     Scheduled Medications: . amLODipine  5 mg Oral Daily  . aspirin EC  81 mg Oral Daily  . atorvastatin  80 mg Oral q1800  . Chlorhexidine Gluconate Cloth  6 each Topical Daily  . Chlorhexidine Gluconate Cloth  6 each Topical Daily   . clopidogrel  75 mg Oral Daily  . docusate sodium  100 mg Oral Daily  . gabapentin  300 mg Oral Daily  . gabapentin  600 mg Oral Daily  . insulin aspart  0-15 Units Subcutaneous Q4H  . iron polysaccharides  150 mg Oral Daily  . latanoprost  1 drop Both Eyes QHS  . mouth rinse  15 mL Mouth Rinse BID  . melatonin  3 mg Oral QHS  . potassium chloride  40 mEq Oral Daily  . sodium chloride flush  10-40 mL Intracatheter Q12H  . sodium chloride flush  3 mL Intravenous Q12H  . sodium chloride flush  3 mL Intravenous Q12H  . spironolactone  12.5 mg Oral Daily  . tamsulosin  0.4 mg Oral Daily  . vitamin B-12  500 mcg Oral Daily  . Vitamin D (Ergocalciferol)  50,000 Units Oral Q Thu    Infusions: . sodium chloride    . sodium chloride    . sodium chloride    . sodium chloride Stopped (01/16/20 1752)  . sodium chloride    . sodium chloride 10 mL/hr at 01/22/20 1300  . amiodarone 60 mg/hr (01/22/20 1300)  . cefTRIAXone (ROCEPHIN)  IV Stopped (01/22/20 1220)  . heparin 1,200 Units/hr (01/22/20 1300)  . milrinone 0.125 mcg/kg/min (01/22/20 1300)  . vancomycin 1,250 mg (01/22/20 1321)    PRN Medications: Place/Maintain arterial line **AND** sodium chloride, Place/Maintain arterial line **AND** sodium chloride, sodium chloride, sodium chloride, sodium chloride, acetaminophen, nitroGLYCERIN, ondansetron (ZOFRAN) IV, sodium chloride flush, sodium chloride flush, sodium chloride flush   Assessment/Plan   1. NSTEMI  -Known coronary disease. In 2019 had DES to RCA.  -LHC 01/09/2020 with multivessel disease including LM disease  - On ASA 81   - On atorvastatin 80 mg daily  - case reviewed extensively with Dr. [name] and Dr.Owen. Agree that best plan is for MV atherectomy/stenting including possible ostial LM. - Stable after IABP removal 5/15. No active CP - holding b-blockers for now with shock on admit and need for IABP/inotropes. Can resume as tolerated - On DAPT and heparin. Will likely  need Eliquis/Plavix on d/c - CR saw today and ambulated  - Should be ready for PCI tomorrow  2. Cardiogenic Shock/Acute Systolic Heart Failure in the setting of NSTEMI - ICM. ECHO 2020 EF 55%---> EF now down 40-45%  - IABP placed in cath lab 5/12. Stable after IABP removal 5/15 - On milrione 0.125 mcg. CO-OX 78%.  - Developed recurrent pulmonary edema 5/18, improved w/ IV Lasix - CVP 8 today  - Give one dose IV lasix today  3. Acute Hypoxic Respiratory Failure due to pulmonary edema - Sats in the ED  80s . Required Bipap.  - improved w/ diuresis  - O2  sats 94% on RA.  - Encourage IS and ambulation. No change - Give lasix today  4. Elevated Lactic Acid  - Lactic Acid on admit 2.5 2/2 cardiogenic shock >> resolved  5. OSA  - Uses CPAP at home  -  Continue at night.   6. H/O of VT - Has ICD - on PO amiodarone 200 mg daily PTA - now on IV amiodarone for rapid AFib - Keep K> 4.0 Mg > 2.0  7. AKI  - Creatinine baseline 1-1.2 - Likely due to HF/shock - Scr 1.38 today   8. Afib w/ RVR - went into rapid afib on 5/14 and again last night. Converted w/IV amio - back in NSR on IV amio. Will continue  9. Hypokalemia - K 3.6 supp K  11. UTI  - 5/17 UA +. Urine culture not sent - continue vanc + ceftriaxone  - foley removed, now w/ voiding difficulty. Add Flomax   12. Acute blood loss anemia - follow closely. No obvious sites of bleeding.  - recheck CBC at 5p     Length of Stay: [place], MD  01/22/2020, 1:43 PM  Advanced Heart Failure Team Pager [phone] (M-F; [place])  Please contact [facility] for night-coverage after hours (4p -7a ) and weekends on [email or web address]

## 2020-01-22 NOTE — Progress Notes (Signed)
Advanced Heart Failure Rounding Note  PCP-Cardiologist: [name], MD     Patient Profile   86 y/o male with h.o CAD, DM2, VT s/p ICD, HTN admitted with NSTEMI and acute systolic HF. Cath by Dr. [name] revealed severe 2v CAD in LD and LCX with borderline ostial LM lesion. Filling pressures markedly elevated with low output. Lactic acid up. IABP placed and milrinone started. Echo w/ EF 40-45%. Seen by TCTS and felt not to be surgical candidate. Plan is for staged PCI.   Subjective:    5/14 was in AF w/ RVR and developed CP. Converted w/ IV amio 5/15 IABP removed 5/16 developed recurrent pulmonary edema, improved w/ IV Lasix 5/17 found to have UTI. Foley removed. Cultures pending. On broad spectrum abx, Vanc + ceftriaxone. mTemp past 24 hrs 101.6. CBC pending.   5/18 back in Afib, in the setting of UTI. Rebolused w/ Amio==>conversion back to NSR  Remains on IV abx for UTI. Now AF. On milrinone 0.125 -> co-ox 78%   Remaining in NSR on IV amio. Creatinine improved.   On heparin. Hgb dropping 11.5 -> 10.8 -> 9.2. No obvious bleeding.   Feels like he has fluid on board.   Objective:   Weight Range: 95.1 kg Body mass index is 30.96 kg/m.   Vital Signs:   Temp:  [97.6 F (36.4 C)-98.7 F (37.1 C)] 97.6 F (36.4 C) (05/19 1120) Pulse Rate:  [65-80] 65 (05/19 1300) Resp:  [15-35] 20 (05/19 1300) BP: (93-149)/(52-67) 134/57 (05/19 1300) SpO2:  [87 %-98 %] 98 % (05/19 1300) Weight:  [95.1 kg] 95.1 kg (05/19 0455) Last BM Date: 01/21/20  Weight change: Filed Weights   01/20/20 0546 01/21/20 0500 01/22/20 0455  Weight: 94.1 kg 92.3 kg 95.1 kg    Intake/Output:   Intake/Output Summary (Last 24 hours) at 01/22/2020 1343 Last data filed at 01/22/2020 1300 Gross per 24 hour  Intake 2200.88 ml  Output 780 ml  Net 1420.88 ml      Physical Exam   General:  Sitting in chair. No resp difficulty HEENT: normal Neck: supple. JVP 8-9 Carotids 2+ bilat; no bruits. No  lymphadenopathy or thryomegaly appreciated. Cor: PMI nondisplaced. Regular rate & rhythm. No rubs, gallops or murmurs. Lungs: clear Abdomen: soft, nontender, nondistended. No hepatosplenomegaly. No bruits or masses. Good bowel sounds. Extremities: no cyanosis, clubbing, rash, tr-1+  edema Neuro: alert & orientedx3, cranial nerves grossly intact. moves all 4 extremities w/o difficulty. Affect pleasan   Telemetry   NSR 60s Personally reviewed   Labs    CBC Recent Labs    01/21/20 0957 01/22/20 0433  WBC 16.8* 13.7*  HGB 10.8* 9.2*  HCT 32.0* 27.9*  MCV 99.7 98.9  PLT 161 Q000111Q   Basic Metabolic Panel Recent Labs    01/21/20 0442 01/22/20 0433  NA 134* 129*  K 3.6 3.6  CL 97* 93*  CO2 27 23  GLUCOSE 165* 323*  BUN 26* 26*  CREATININE 1.48* 1.38*  CALCIUM 8.8* 8.3*   Liver Function Tests No results for input(s): AST, ALT, ALKPHOS, BILITOT, PROT, ALBUMIN in the last 72 hours. No results for input(s): LIPASE, AMYLASE in the last 72 hours. Cardiac Enzymes No results for input(s): CKTOTAL, CKMB, CKMBINDEX, TROPONINI in the last 72 hours.  BNP: BNP (last 3 results) Recent Labs    02/02/2020 1403  BNP 597.8*    ProBNP (last 3 results) No results for input(s): PROBNP in the last 8760 hours.   D-Dimer No results  for input(s): DDIMER in the last 72 hours. Hemoglobin A1C No results for input(s): HGBA1C in the last 72 hours. Fasting Lipid Panel No results for input(s): CHOL, HDL, LDLCALC, TRIG, CHOLHDL, LDLDIRECT in the last 72 hours. Thyroid Function Tests No results for input(s): TSH, T4TOTAL, T3FREE, THYROIDAB in the last 72 hours.  Invalid input(s): FREET3  Other results:   Imaging    No results found.   Medications:     Scheduled Medications: . amLODipine  5 mg Oral Daily  . aspirin EC  81 mg Oral Daily  . atorvastatin  80 mg Oral q1800  . Chlorhexidine Gluconate Cloth  6 each Topical Daily  . Chlorhexidine Gluconate Cloth  6 each Topical Daily   . clopidogrel  75 mg Oral Daily  . docusate sodium  100 mg Oral Daily  . gabapentin  300 mg Oral Daily  . gabapentin  600 mg Oral Daily  . insulin aspart  0-15 Units Subcutaneous Q4H  . iron polysaccharides  150 mg Oral Daily  . latanoprost  1 drop Both Eyes QHS  . mouth rinse  15 mL Mouth Rinse BID  . melatonin  3 mg Oral QHS  . potassium chloride  40 mEq Oral Daily  . sodium chloride flush  10-40 mL Intracatheter Q12H  . sodium chloride flush  3 mL Intravenous Q12H  . sodium chloride flush  3 mL Intravenous Q12H  . spironolactone  12.5 mg Oral Daily  . tamsulosin  0.4 mg Oral Daily  . vitamin B-12  500 mcg Oral Daily  . Vitamin D (Ergocalciferol)  50,000 Units Oral Q Thu    Infusions: . sodium chloride    . sodium chloride    . sodium chloride    . sodium chloride Stopped (01/16/20 1752)  . sodium chloride    . sodium chloride 10 mL/hr at 01/22/20 1300  . amiodarone 60 mg/hr (01/22/20 1300)  . cefTRIAXone (ROCEPHIN)  IV Stopped (01/22/20 1220)  . heparin 1,200 Units/hr (01/22/20 1300)  . milrinone 0.125 mcg/kg/min (01/22/20 1300)  . vancomycin 1,250 mg (01/22/20 1321)    PRN Medications: Place/Maintain arterial line **AND** sodium chloride, Place/Maintain arterial line **AND** sodium chloride, sodium chloride, sodium chloride, sodium chloride, acetaminophen, nitroGLYCERIN, ondansetron (ZOFRAN) IV, sodium chloride flush, sodium chloride flush, sodium chloride flush   Assessment/Plan   1. NSTEMI  -Known coronary disease. In 2019 had DES to RCA.  -LHC 01/05/2020 with multivessel disease including LM disease  - On ASA 81   - On atorvastatin 80 mg daily  - case reviewed extensively with Dr. [name] and Dr.Owen. Agree that best plan is for MV atherectomy/stenting including possible ostial LM. - Stable after IABP removal 5/15. No active CP - holding b-blockers for now with shock on admit and need for IABP/inotropes. Can resume as tolerated - On DAPT and heparin. Will likely  need Eliquis/Plavix on d/c - CR saw today and ambulated  - Should be ready for PCI tomorrow  2. Cardiogenic Shock/Acute Systolic Heart Failure in the setting of NSTEMI - ICM. ECHO 2020 EF 55%---> EF now down 40-45%  - IABP placed in cath lab 5/12. Stable after IABP removal 5/15 - On milrione 0.125 mcg. CO-OX 78%.  - Developed recurrent pulmonary edema 5/18, improved w/ IV Lasix - CVP 8 today  - Give one dose IV lasix today  3. Acute Hypoxic Respiratory Failure due to pulmonary edema - Sats in the ED  80s . Required Bipap.  - improved w/ diuresis  - O2  sats 94% on RA.  - Encourage IS and ambulation. No change - Give lasix today  4. Elevated Lactic Acid  - Lactic Acid on admit 2.5 2/2 cardiogenic shock >> resolved  5. OSA  - Uses CPAP at home  -  Continue at night.   6. H/O of VT - Has ICD - on PO amiodarone 200 mg daily PTA - now on IV amiodarone for rapid AFib - Keep K> 4.0 Mg > 2.0  7. AKI  - Creatinine baseline 1-1.2 - Likely due to HF/shock - Scr 1.38 today   8. Afib w/ RVR - went into rapid afib on 5/14 and again last night. Converted w/IV amio - back in NSR on IV amio. Will continue  9. Hypokalemia - K 3.6 supp K  11. UTI  - 5/17 UA +. Urine culture not sent - continue vanc + ceftriaxone  - foley removed, now w/ voiding difficulty. Add Flomax   12. Acute blood loss anemia - follow closely. No obvious sites of bleeding.  - recheck CBC at 5p     Length of Stay: [place], MD  01/22/2020, 1:43 PM  Advanced Heart Failure Team Pager [phone] (M-F; [place])  Please contact [facility] for night-coverage after hours (4p -7a ) and weekends on [email or web address]

## 2020-01-22 NOTE — Progress Notes (Signed)
ANTICOAGULATION CONSULT NOTE  Pharmacy Consult for heparin Indication: chest pain/ACS/Afib  No Known Allergies  Patient Measurements: Height: 5\' 9"  (175.3 cm) Weight: 95.1 kg (209 lb 10.5 oz) IBW/kg (Calculated) : 70.7 Heparin Dosing Weight: 89 kg   Vital Signs: Temp: 98.6 F (37 C) (05/19 0400) Temp Source: Axillary (05/19 0400) BP: 136/61 (05/19 0400) Pulse Rate: 73 (05/19 0500)  Labs: Recent Labs    01/20/20 0452 01/20/20 0452 01/21/20 0442 01/21/20 0957 01/22/20 0433  HGB 11.5*   < >  --  10.8* 9.2*  HCT 34.4*  --   --  32.0* 27.9*  PLT 153  --   --  161 161  HEPARINUNFRC 0.37  --  0.43  --  0.38  CREATININE 1.29*  --  1.48*  --  1.38*   < > = values in this interval not displayed.    Estimated Creatinine Clearance: 46.2 mL/min (A) (by C-G formula based on SCr of 1.38 mg/dL (H)).   Medical History: Past Medical History:  Diagnosis Date  . Acute respiratory failure ([place])   . BPH (benign prostatic hyperplasia)   . Cardiac arrest ([place])   . Carpal tunnel syndrome   . CKD (chronic kidney disease)   . Diabetes ([place])   . DM II (diabetes mellitus, type II), controlled ([place])   . High risk medication use   . Hyperlipidemia   . Hypertension   . Low vitamin D level   . STEMI (ST elevation myocardial infarction) ([place])    01/22/18 PCI/DES to RCA  . Systolic heart failure ([place])   . VT (ventricular tachycardia) ([facility])     Medications:  Medications Prior to Admission  Medication Sig Dispense Refill Last Dose  . amiodarone (PACERONE) 200 MG tablet Take one tablet by mouth daily Monday through Saturday.  Do NOT take on Sunday. (Patient taking differently: Take 200 mg by mouth See admin instructions. Take 200 mg by mouth in the morning on Mon/Tues/Wed/Thurs/Fri/Sat and nothing on Sunday) 90 tablet 3 01/22/2020 at am  . amLODipine (NORVASC) 10 MG tablet Take 10 mg by mouth daily.    01/17/2020 at am  . ammonium lactate (LAC-HYDRIN) 12 % lotion Apply 1 application topically  2 (two) times daily as needed (to affected areas of legs and hands).    01/26/2020 at Unknown time  . aspirin EC 81 MG tablet Take 81 mg by mouth daily.   01/31/2020 at 0800  . atorvastatin (LIPITOR) 80 MG tablet TAKE 1 TABLET BY MOUTH ONCE DAILY AT  6  PM (Patient taking differently: Take 80 mg by mouth See admin instructions. Take 80 mg by mouth at 5 PM daily) 90 tablet 1 01/14/2020 at pm  . clopidogrel (PLAVIX) 75 MG tablet Take 1 tablet by mouth once daily (Patient taking differently: Take 75 mg by mouth daily. ) 90 tablet 3 02/01/2020 at 0800  . FERREX 150 150 MG capsule TAKE 1 CAPSULE BY MOUTH ONCE DAILY (Patient taking differently: Take 150 mg by mouth daily. ) 90 capsule 3 01/29/2020 at am  . furosemide (LASIX) 40 MG tablet Take 1 tablet (40 mg total) by mouth daily. (Patient taking differently: Take 40 mg by mouth See admin instructions. Take 40 mg by mouth in the morning and 40 mg at 1:30 PM) 90 tablet 3 01/14/2020 at am  . gabapentin (NEURONTIN) 300 MG capsule Take 300-600 mg by mouth See admin instructions. Take 300 mg by mouth in the morning and 600 mg at 5 PM  1 02/03/2020  at am  . glimepiride (AMARYL) 2 MG tablet Take 1 mg by mouth daily with breakfast.    01/07/2020 at am  . isosorbide mononitrate (IMDUR) 30 MG 24 hr tablet Take 1 tablet by mouth once daily (Patient taking differently: Take 30 mg by mouth in the morning. ) 90 tablet 0 01/12/2020 at am  . latanoprost (XALATAN) 0.005 % ophthalmic solution Place 1 drop into both eyes at bedtime.   01/14/2020 at pm  . losartan (COZAAR) 100 MG tablet Take 1 tablet (100 mg total) by mouth daily. (Patient taking differently: Take 100 mg by mouth See admin instructions. Take 100 mg by mouth at 5 PM daily) 90 tablet 3 01/14/2020 at pm  . metoprolol succinate (TOPROL-XL) 50 MG 24 hr tablet Take 1 tablet (50 mg total) by mouth daily. Take with or immediately following a meal. 90 tablet 3 01/28/2020 at 0800  . nitroGLYCERIN (NITROSTAT) 0.4 MG SL tablet Place 1  tablet (0.4 mg total) under the tongue every 5 (five) minutes x 3 doses as needed for chest pain. 25 tablet 1 01/17/2020 at Unknown time  . potassium chloride SA (KLOR-CON M20) 20 MEQ tablet Take 1 tablet (20 mEq total) by mouth daily. 30 tablet 3 02/02/2020 at am  . terazosin (HYTRIN) 1 MG capsule Take 1 mg by mouth See admin instructions. Take 1 mg by mouth at 5 PM daily   01/14/2020 at pm  . triamcinolone cream (KENALOG) 0.1 % Apply 1 application topically as needed (to itching areas of skin).    unk at unk  . vitamin B-12 (CYANOCOBALAMIN) 500 MCG tablet Take 500 mcg by mouth daily.   01/20/2020 at am  . Vitamin D, Ergocalciferol, (DRISDOL) 50000 units CAPS capsule Take 50,000 Units by mouth every Thursday.    01/09/2020 at Unknown time    Assessment: 9 YOM with elevated troponin, started IV heparin for ACS in the ED.   Patient s/p left and right heart cath with IABP insertion. Pt also in new onset AF RVR.  IABP removed 5/15.  Heparin level 0.38 this morning at goal on heparin drip 1200 units/hr running through PICC line. Will need peripheral stick from phlebotomy for accurate levels. Hemoglobin dropped this morning from 10.8 to 9.2.  No bleeding, complications, or infusion issues per the RN.   Goal of Therapy:  Heparin level 0.3-0.4 units/ml Monitor platelets by anticoagulation protocol: Yes   Plan:  -Continue IV heparin 1200 units/hr. -Daily heparin level and CBC. -Monitor for s/sx of bleeding  Agnes Lawrence, PharmD PGY1 Pharmacy Resident

## 2020-01-22 NOTE — Progress Notes (Signed)
CARDIAC REHAB PHASE I   PRE:  Rate/Rhythm: 66 SR    BP: sitting 141/63    SaO2: 99 2L  MODE:  Ambulation: 270 ft   POST:  Rate/Rhythm: 82 SR    BP: sitting 151/64     SaO2: 96 2L  Pt able to stand and walk with RW. Assist x1 for equipment, 2L. Slow and steady. x1 LOB when distracted. Return to recliner. No major c/o. Will f/u tomorrow. Granddaughter assisted with equipment, very helpful. [place], ACSM 01/22/2020 11:09 AM

## 2020-01-22 NOTE — Progress Notes (Signed)
Physical Therapy Treatment Patient Details Name: Daryl Rodriguez MRN: [id number] DOB: Jan 16, 1934 Today's Date: 01/22/2020    History of Present Illness Patient is a 86 y/o male who presents with chest pain and SOB. Admitted with NSTEMI, acute respiratory failure and acute pulmonary edema from CHF. Plan is for staged PCI 02/02/2020.PMH includes HTN, HLD, DM2, CKD III, PVD, wrist surgery, back surgery, VT, systolic heart failure    PT Comments    Pt able to transfer min guard and amb min guard today. Pt amb 4ft to bathroom without RW and assistance for lines with no LOB. Pt amb >400 feet in hallway with RW and min guard assist on RA. Pt VSS on RA with functional mobility and amb. Pt granddaughter present and engaged in the session. Will continue to follow acutely.   Follow Up Recommendations  Home health PT;Supervision for mobility/OOB     Equipment Recommendations  None recommended by PT    Recommendations for Other Services       Precautions / Restrictions Precautions Precautions: Fall Restrictions Weight Bearing Restrictions: No    Mobility  Bed Mobility               General bed mobility comments: Up in chair upon PT arrival.  Transfers Overall transfer level: Needs assistance Equipment used: Rolling walker (2 wheeled) Transfers: Sit to/from Stand Sit to Stand: Min guard         General transfer comment: Pt min guard sit to stand for safety and line management  Ambulation/Gait Ambulation/Gait assistance: Min guard Gait Distance (Feet): 430 Feet Assistive device: Rolling walker (2 wheeled) Gait Pattern/deviations: Step-through pattern;Decreased stride length;Drifts right/left Gait velocity: Decreased   General Gait Details: Pt able to amb 96ft to bathroom without RW. Pt amb with RW and x2 standing rest breaks, 1x seated rest break. Pt amb without o2, spo2 93-95% RA. No notable LOB during amb bout.   Stairs             Wheelchair Mobility     Modified Rankin (Stroke Patients Only)       Balance Overall balance assessment: Needs assistance Sitting-balance support: Feet supported;No upper extremity supported Sitting balance-Leahy Scale: Good     Standing balance support: During functional activity;Bilateral upper extremity supported Standing balance-Leahy Scale: Good Standing balance comment: Pt requires UE support upon request for amb, offered to try cane, pt reported he wanted to use RW. Pt able to amb 44ft without UE support, >483ft with RW.                            Cognition Arousal/Alertness: Awake/alert Behavior During Therapy: WFL for tasks assessed/performed Overall Cognitive Status: Difficult to assess                           Safety/Judgement: Decreased awareness of safety;Decreased awareness of deficits     General Comments: Pt reported he is hard of hearing at baseline. Pt required verbal cuing to wait PT to manage lines before to amb or transfering.      Exercises      General Comments General comments (skin integrity, edema, etc.): Granddaughter [name], present during session.      Pertinent Vitals/Pain Pain Assessment: No/denies pain    Home Living                      Prior Function  PT Goals (current goals can now be found in the care plan section) Acute Rehab PT Goals Patient Stated Goal: go home PT Goal Formulation: With patient Time For Goal Achievement: 02/03/20 Potential to Achieve Goals: Good Progress towards PT goals: Progressing toward goals    Frequency    Min 3X/week      PT Plan Current plan remains appropriate    Co-evaluation              AM-PAC PT "6 Clicks" Mobility   Outcome Measure  Help needed turning from your back to your side while in a flat bed without using bedrails?: A Little Help needed moving from lying on your back to sitting on the side of a flat bed without using bedrails?: A Little Help  needed moving to and from a bed to a chair (including a wheelchair)?: A Little Help needed standing up from a chair using your arms (e.g., wheelchair or bedside chair)?: A Little Help needed to walk in hospital room?: A Little Help needed climbing 3-5 steps with a railing? : A Little 6 Click Score: 18    End of Session Equipment Utilized During Treatment: Gait belt Activity Tolerance: Patient tolerated treatment well Patient left: in chair;with family/visitor present Nurse Communication: Mobility status;Other (comment)(oxygen on RA) PT Visit Diagnosis: Muscle weakness (generalized) (M62.81);Unsteadiness on feet (R26.81);Difficulty in walking, not elsewhere classified (R26.2)     Time: 1349-1420 PT Time Calculation (min) (ACUTE ONLY): 31 min  Charges:  $Therapeutic Exercise: 8-22 mins $Therapeutic Activity: 8-22 mins                     [employer] SPT 01/22/2020    Rolland Porter 01/22/2020, 5:44 PM

## 2020-01-23 ENCOUNTER — Inpatient Hospital Stay (HOSPITAL_COMMUNITY): Admission: EM | Disposition: E | Payer: Self-pay | Source: Home / Self Care | Attending: Cardiology

## 2020-01-23 DIAGNOSIS — I214 Non-ST elevation (NSTEMI) myocardial infarction: Secondary | ICD-10-CM

## 2020-01-23 DIAGNOSIS — I2510 Atherosclerotic heart disease of native coronary artery without angina pectoris: Secondary | ICD-10-CM

## 2020-01-23 HISTORY — PX: CORONARY ATHERECTOMY: CATH118238

## 2020-01-23 HISTORY — PX: CORONARY STENT INTERVENTION: CATH118234

## 2020-01-23 HISTORY — PX: LEFT HEART CATH: CATH118248

## 2020-01-23 LAB — CBC
HCT: 28.3 % — ABNORMAL LOW (ref 39.0–52.0)
HCT: 29.5 % — ABNORMAL LOW (ref 39.0–52.0)
Hemoglobin: 9.3 g/dL — ABNORMAL LOW (ref 13.0–17.0)
Hemoglobin: 9.6 g/dL — ABNORMAL LOW (ref 13.0–17.0)
MCH: 32.3 pg (ref 26.0–34.0)
MCH: 32.4 pg (ref 26.0–34.0)
MCHC: 32.9 g/dL (ref 30.0–36.0)
MCHC: 32.5 g/dL (ref 30.0–36.0)
MCV: 98.3 fL (ref 80.0–100.0)
MCV: 99.7 fL (ref 80.0–100.0)
Platelets: 202 10*3/uL (ref 150–400)
Platelets: 213 10*3/uL (ref 150–400)
RBC: 2.88 MIL/uL — ABNORMAL LOW (ref 4.22–5.81)
RBC: 2.96 MIL/uL — ABNORMAL LOW (ref 4.22–5.81)
RDW: 14.1 % (ref 11.5–15.5)
RDW: 14.1 % (ref 11.5–15.5)
WBC: 9 10*3/uL (ref 4.0–10.5)
WBC: 10.4 10*3/uL (ref 4.0–10.5)
nRBC: 0 % (ref 0.0–0.2)
nRBC: 0 % (ref 0.0–0.2)

## 2020-01-23 LAB — POCT I-STAT 7, (LYTES, BLD GAS, ICA,H+H)
Acid-base deficit: 3 mmol/L — ABNORMAL HIGH (ref 0.0–2.0)
Bicarbonate: 21.5 mmol/L (ref 20.0–28.0)
Calcium, Ion: 1.22 mmol/L (ref 1.15–1.40)
HCT: 28 % — ABNORMAL LOW (ref 39.0–52.0)
Hemoglobin: 9.5 g/dL — ABNORMAL LOW (ref 13.0–17.0)
O2 Saturation: 88 %
Potassium: 4.1 mmol/L (ref 3.5–5.1)
Sodium: 132 mmol/L — ABNORMAL LOW (ref 135–145)
TCO2: 23 mmol/L (ref 22–32)
pCO2 arterial: 37.6 mmHg (ref 32.0–48.0)
pH, Arterial: 7.366 (ref 7.350–7.450)
pO2, Arterial: 57 mmHg — ABNORMAL LOW (ref 83.0–108.0)

## 2020-01-23 LAB — COOXEMETRY PANEL
Carboxyhemoglobin: 1.6 % — ABNORMAL HIGH (ref 0.5–1.5)
Carboxyhemoglobin: 0.8 % (ref 0.5–1.5)
Methemoglobin: 1.2 % (ref 0.0–1.5)
Methemoglobin: 0.8 % (ref 0.0–1.5)
O2 Saturation: 82.9 %
O2 Saturation: 62.5 %
Total hemoglobin: 9.7 g/dL — ABNORMAL LOW (ref 12.0–16.0)
Total hemoglobin: 10.2 g/dL — ABNORMAL LOW (ref 12.0–16.0)

## 2020-01-23 LAB — GLUCOSE, CAPILLARY
Glucose-Capillary: 147 mg/dL — ABNORMAL HIGH (ref 70–99)
Glucose-Capillary: 155 mg/dL — ABNORMAL HIGH (ref 70–99)
Glucose-Capillary: 159 mg/dL — ABNORMAL HIGH (ref 70–99)
Glucose-Capillary: 161 mg/dL — ABNORMAL HIGH (ref 70–99)
Glucose-Capillary: 169 mg/dL — ABNORMAL HIGH (ref 70–99)
Glucose-Capillary: 191 mg/dL — ABNORMAL HIGH (ref 70–99)

## 2020-01-23 LAB — HEPARIN LEVEL (UNFRACTIONATED)
Heparin Unfractionated: >2.2 IU/mL — ABNORMAL HIGH (ref 0.30–0.70)
Heparin Unfractionated: 0.57 IU/mL (ref 0.30–0.70)
Heparin Unfractionated: 0.36 IU/mL (ref 0.30–0.70)

## 2020-01-23 LAB — BASIC METABOLIC PANEL
Anion gap: 11 (ref 5–15)
BUN: 29 mg/dL — ABNORMAL HIGH (ref 8–23)
CO2: 24 mmol/L (ref 22–32)
Calcium: 8.6 mg/dL — ABNORMAL LOW (ref 8.9–10.3)
Chloride: 95 mmol/L — ABNORMAL LOW (ref 98–111)
Creatinine, Ser: 1.5 mg/dL — ABNORMAL HIGH (ref 0.61–1.24)
GFR calc Af Amer: 49 mL/min — ABNORMAL LOW (ref >60)
GFR calc non Af Amer: 42 mL/min — ABNORMAL LOW (ref >60)
Glucose, Bld: 241 mg/dL — ABNORMAL HIGH (ref 70–99)
Potassium: 4.3 mmol/L (ref 3.5–5.1)
Sodium: 130 mmol/L — ABNORMAL LOW (ref 135–145)

## 2020-01-23 LAB — POCT ACTIVATED CLOTTING TIME
Activated Clotting Time: 268 seconds
Activated Clotting Time: 274 seconds

## 2020-01-23 SURGERY — CORONARY STENT INTERVENTION
Anesthesia: LOCAL

## 2020-01-23 MED ORDER — SODIUM CHLORIDE 0.9 % IV SOLN: 250.0000 mL | INTRAVENOUS | Status: DC | PRN

## 2020-01-23 MED ORDER — IOHEXOL 350 MG/ML SOLN
INTRAVENOUS | Status: AC
  Filled 2020-01-23: qty 1

## 2020-01-23 MED ORDER — VERAPAMIL HCL 2.5 MG/ML IV SOLN
INTRAVENOUS | Status: AC
  Filled 2020-01-23: qty 2

## 2020-01-23 MED ORDER — FUROSEMIDE 10 MG/ML IJ SOLN
INTRAMUSCULAR | Status: DC | PRN
  Administered 2020-01-23: 80 mg via INTRAVENOUS

## 2020-01-23 MED ORDER — FUROSEMIDE 10 MG/ML IJ SOLN
INTRAMUSCULAR | Status: AC
  Filled 2020-01-23: qty 8

## 2020-01-23 MED ORDER — MIDAZOLAM HCL 2 MG/2ML IJ SOLN
INTRAMUSCULAR | Status: DC | PRN
  Administered 2020-01-23 (×2): 1 mg via INTRAVENOUS

## 2020-01-23 MED ORDER — LIDOCAINE HCL (PF) 1 % IJ SOLN
INTRAMUSCULAR | Status: DC | PRN
  Administered 2020-01-23: 2 mL

## 2020-01-23 MED ORDER — NITROGLYCERIN 1 MG/10 ML FOR IR/CATH LAB
INTRA_ARTERIAL | Status: DC | PRN
  Administered 2020-01-23: 200 ug via INTRACORONARY
  Administered 2020-01-23 (×2): 150 ug via INTRACORONARY

## 2020-01-23 MED ORDER — VERAPAMIL HCL 2.5 MG/ML IV SOLN
INTRAVENOUS | Status: DC | PRN
  Administered 2020-01-23 (×4): 200 ug via INTRACORONARY

## 2020-01-23 MED ORDER — HEPARIN SODIUM (PORCINE) 1000 UNIT/ML IJ SOLN
INTRAMUSCULAR | Status: DC | PRN
  Administered 2020-01-23: 9000 [IU] via INTRAVENOUS
  Administered 2020-01-23: 3000 [IU] via INTRAVENOUS

## 2020-01-23 MED ORDER — MIDAZOLAM HCL 2 MG/2ML IJ SOLN
INTRAMUSCULAR | Status: AC
  Filled 2020-01-23: qty 2

## 2020-01-23 MED ORDER — IOHEXOL 350 MG/ML SOLN
INTRAVENOUS | Status: DC | PRN
  Administered 2020-01-23: 120 mL

## 2020-01-23 MED ORDER — HEPARIN (PORCINE) IN NACL 1000-0.9 UT/500ML-% IV SOLN
INTRAVENOUS | Status: AC
  Filled 2020-01-23: qty 1000

## 2020-01-23 MED ORDER — SODIUM CHLORIDE 0.9% FLUSH: 3.0000 mL | INTRAVENOUS | Status: DC | PRN

## 2020-01-23 MED ORDER — NITROGLYCERIN 1 MG/10 ML FOR IR/CATH LAB
INTRA_ARTERIAL | Status: AC
  Filled 2020-01-23: qty 10

## 2020-01-23 MED ORDER — HEPARIN (PORCINE) IN NACL 1000-0.9 UT/500ML-% IV SOLN
INTRAVENOUS | Status: DC | PRN
  Administered 2020-01-23: 500 mL

## 2020-01-23 MED ORDER — ASPIRIN 81 MG PO CHEW
81.0000 mg | CHEWABLE_TABLET | ORAL | Status: AC
  Administered 2020-01-23: 81 mg via ORAL
  Filled 2020-01-23: qty 1

## 2020-01-23 MED ORDER — FENTANYL CITRATE (PF) 100 MCG/2ML IJ SOLN
INTRAMUSCULAR | Status: DC | PRN
  Administered 2020-01-23 (×2): 25 ug via INTRAVENOUS

## 2020-01-23 MED ORDER — VIPERSLIDE LUBRICANT OPTIME
TOPICAL | Status: DC | PRN
  Administered 2020-01-23: 500 mL via SURGICAL_CAVITY

## 2020-01-23 MED ORDER — SODIUM CHLORIDE 0.9% FLUSH
3.0000 mL | Freq: Two times a day (BID) | INTRAVENOUS | Status: DC
  Administered 2020-01-24: 10 mL via INTRAVENOUS
  Administered 2020-01-24: 3 mL via INTRAVENOUS

## 2020-01-23 MED ORDER — SODIUM CHLORIDE 0.9 % IV SOLN
INTRAVENOUS | Status: DC
  Administered 2020-01-23: 250 mL via INTRAVENOUS

## 2020-01-23 MED ORDER — SODIUM CHLORIDE 0.9% FLUSH
3.0000 mL | Freq: Two times a day (BID) | INTRAVENOUS | Status: DC
  Administered 2020-01-23: 3 mL via INTRAVENOUS

## 2020-01-23 MED ORDER — HEPARIN (PORCINE) 25000 UT/250ML-% IV SOLN
1200.0000 [IU]/h | INTRAVENOUS | Status: DC
  Administered 2020-01-24 – 2020-01-25 (×2): 1000 [IU]/h via INTRAVENOUS
  Administered 2020-01-26 – 2020-01-27 (×3): 950 [IU]/h via INTRAVENOUS
  Administered 2020-01-28: 1000 [IU]/h via INTRAVENOUS
  Administered 2020-01-29: 1200 [IU]/h via INTRAVENOUS
  Filled 2020-01-23 (×7): qty 250

## 2020-01-23 MED ORDER — LIDOCAINE HCL (PF) 1 % IJ SOLN
INTRAMUSCULAR | Status: AC
  Filled 2020-01-23: qty 30

## 2020-01-23 MED ORDER — HYDRALAZINE HCL 20 MG/ML IJ SOLN: 10.0000 mg | INTRAMUSCULAR | Status: AC | PRN

## 2020-01-23 MED ORDER — HEPARIN SODIUM (PORCINE) 1000 UNIT/ML IJ SOLN
INTRAMUSCULAR | Status: AC
  Filled 2020-01-23: qty 1

## 2020-01-23 MED ORDER — LABETALOL HCL 5 MG/ML IV SOLN: 10.0000 mg | INTRAVENOUS | Status: AC | PRN

## 2020-01-23 MED ORDER — VERAPAMIL HCL 2.5 MG/ML IV SOLN
INTRAVENOUS | Status: DC | PRN
  Administered 2020-01-23: 10 mL via INTRA_ARTERIAL

## 2020-01-23 MED ORDER — FENTANYL CITRATE (PF) 100 MCG/2ML IJ SOLN
INTRAMUSCULAR | Status: AC
  Filled 2020-01-23: qty 2

## 2020-01-23 SURGICAL SUPPLY — 21 items
BALLN SAPPHIRE 2.0X15 (BALLOONS) ×2
BALLN SAPPHIRE ~~LOC~~ 2.5X18 (BALLOONS) ×1 IMPLANT
BALLOON SAPPHIRE 2.0X15 (BALLOONS) IMPLANT
CATH INFINITI 5FR ANG PIGTAIL (CATHETERS) ×1 IMPLANT
CATH VISTA GUIDE 6FR XBLAD3.5 (CATHETERS) ×1 IMPLANT
CROWN DIAMONDBACK CLASSIC 1.25 (BURR) ×1 IMPLANT
DEVICE RAD COMP TR BAND LRG (VASCULAR PRODUCTS) ×1 IMPLANT
ELECT DEFIB PAD ADLT CADENCE (PAD) ×1 IMPLANT
GLIDESHEATH SLEND SS 6F .021 (SHEATH) ×1 IMPLANT
GUIDEWIRE INQWIRE 1.5J.035X260 (WIRE) IMPLANT
HOVERMATT SINGLE USE (MISCELLANEOUS) ×1 IMPLANT
INQWIRE 1.5J .035X260CM (WIRE) ×2
KIT ENCORE 26 ADVANTAGE (KITS) ×1 IMPLANT
KIT HEART LEFT (KITS) ×2 IMPLANT
LUBRICANT VIPERSLIDE CORONARY (MISCELLANEOUS) ×1 IMPLANT
PACK CARDIAC CATHETERIZATION (CUSTOM PROCEDURE TRAY) ×2 IMPLANT
STENT RESOLUTE ONYX 2.25X34 (Permanent Stent) ×1 IMPLANT
TRANSDUCER W/STOPCOCK (MISCELLANEOUS) ×2 IMPLANT
TUBING CIL FLEX 10 FLL-RA (TUBING) ×2 IMPLANT
WIRE COUGAR XT STRL 190CM (WIRE) ×1 IMPLANT
WIRE VIPERWIRE COR FLEX .012 (WIRE) ×1 IMPLANT

## 2020-01-23 NOTE — Progress Notes (Signed)
CARDIAC REHAB PHASE I   PRE:  Rate/Rhythm: 70 SR    BP: sitting 129/52    SaO2: 96 6L, 94 4L  MODE:  Ambulation: 190 ft   POST:  Rate/Rhythm: 84 SR    BP: sitting 122/57     SaO2: 92 4L  Pt more tired today. Sts he didn't sleep well. On more O2 as well although we did decrease to 4L from 6L for walk. Stood independently and used RW, 4L, assist x1. Needed reminder to stay inside RW at first turn. Weak legs today and c/o specifically left thigh being tight. Slightly unsteady at times. Shorter walk. Return to recliner, family present. Practiced IS. will f/u tomorrow. [id number]   [place], ACSM 01/13/2020 11:51 AM

## 2020-01-23 NOTE — Progress Notes (Addendum)
Advanced Heart Failure Rounding Note  PCP-Cardiologist: [name], MD     Patient Profile   86 y/o male with h.o CAD, DM2, VT s/p ICD, HTN admitted with NSTEMI and acute systolic HF. Cath by Dr. [name] revealed severe 2v CAD in LD and LCX with borderline ostial LM lesion. Filling pressures markedly elevated with low output. Lactic acid up. IABP placed and milrinone started. Echo w/ EF 40-45%. Seen by TCTS and felt not to be surgical candidate. Plan is for staged PCI.   Subjective:    5/14 was in AF w/ RVR and developed CP. Converted w/ IV amio 5/15 IABP removed 5/16 developed recurrent pulmonary edema, improved w/ IV Lasix 5/17 found to have UTI. Foley removed. Cultures pending. On broad spectrum abx, Vanc + ceftriaxone.  5/18 back in Afib, in the setting of UTI. Rebolused w/ Amio==>conversion back to NSR  In and out of afib today. V-rates controlled. Asymptomatic.   Remains on IV abx for UTI. Now AF. On milrinone 0.125 -> co-ox 83%   On heparin. Hgb 9.3.  No obvious bleeding.   1.6L in UOP yesterday. SCr up 1.38>>1.50.   Feels well. No complaints. Denies resting CP. No dyspnea.    Objective:   Weight Range: 95.3 kg Body mass index is 31.03 kg/m.   Vital Signs:   Temp:  [97.6 F (36.4 C)-99 F (37.2 C)] 98.2 F (36.8 C) (05/20 0757) Pulse Rate:  [65-94] 94 (05/20 0700) Resp:  [12-24] 24 (05/20 0700) BP: (93-141)/(48-67) 128/54 (05/20 0700) SpO2:  [76 %-98 %] 91 % (05/20 0700) Weight:  [95.3 kg] 95.3 kg (05/20 0500) Last BM Date: 01/21/20  Weight change: Filed Weights   01/21/20 0500 01/22/20 0455 01/06/2020 0500  Weight: 92.3 kg 95.1 kg 95.3 kg    Intake/Output:   Intake/Output Summary (Last 24 hours) at 01/09/2020 0818 Last data filed at 01/06/2020 0600 Gross per 24 hour  Intake 1760.42 ml  Output 1575 ml  Net 185.42 ml      Physical Exam   General:  Well appearing elderly WM Sitting up in chair. No resp difficulty HEENT: normal Neck:  supple. No JVD Carotids 2+ bilat; no bruits. No lymphadenopathy or thryomegaly appreciated. Cor: PMI nondisplaced. Regular rate & rhythm. No rubs, gallops or murmurs. Lungs: clear Abdomen: soft, nontender, nondistended. No hepatosplenomegaly. No bruits or masses. Good bowel sounds. Extremities: no cyanosis, clubbing, rash, no edema Neuro: alert & orientedx3, cranial nerves grossly intact. moves all 4 extremities w/o difficulty. Affect pleasan   Telemetry   Currently NSR, in and out of afib this AM, rates 80s-90  Personally reviewed   Labs    CBC Recent Labs    01/22/20 1700 01/15/2020 0635  WBC 11.1* 9.0  HGB 10.3* 9.3*  HCT 30.7* 28.3*  MCV 99.0 98.3  PLT 195 [id number]   Basic Metabolic Panel Recent Labs    01/22/20 0433 01/04/2020 0636  NA 129* 130*  K 3.6 4.3  CL 93* 95*  CO2 23 24  GLUCOSE 323* 241*  BUN 26* 29*  CREATININE 1.38* 1.50*  CALCIUM 8.3* 8.6*   Liver Function Tests No results for input(s): AST, ALT, ALKPHOS, BILITOT, PROT, ALBUMIN in the last 72 hours. No results for input(s): LIPASE, AMYLASE in the last 72 hours. Cardiac Enzymes No results for input(s): CKTOTAL, CKMB, CKMBINDEX, TROPONINI in the last 72 hours.  BNP: BNP (last 3 results) Recent Labs    02/01/2020 1403  BNP 597.8*    ProBNP (last 3 results)  No results for input(s): PROBNP in the last 8760 hours.   D-Dimer No results for input(s): DDIMER in the last 72 hours. Hemoglobin A1C No results for input(s): HGBA1C in the last 72 hours. Fasting Lipid Panel No results for input(s): CHOL, HDL, LDLCALC, TRIG, CHOLHDL, LDLDIRECT in the last 72 hours. Thyroid Function Tests No results for input(s): TSH, T4TOTAL, T3FREE, THYROIDAB in the last 72 hours.  Invalid input(s): FREET3  Other results:   Imaging    No results found.   Medications:     Scheduled Medications: . amLODipine  5 mg Oral Daily  . aspirin  81 mg Oral Pre-Cath  . aspirin EC  81 mg Oral Daily  . atorvastatin  80 mg  Oral q1800  . Chlorhexidine Gluconate Cloth  6 each Topical Daily  . Chlorhexidine Gluconate Cloth  6 each Topical Daily  . clopidogrel  75 mg Oral Daily  . docusate sodium  100 mg Oral Daily  . gabapentin  300 mg Oral Daily  . gabapentin  600 mg Oral Daily  . insulin aspart  0-15 Units Subcutaneous Q4H  . iron polysaccharides  150 mg Oral Daily  . latanoprost  1 drop Both Eyes QHS  . mouth rinse  15 mL Mouth Rinse BID  . melatonin  3 mg Oral QHS  . potassium chloride  40 mEq Oral Daily  . sodium chloride flush  10-40 mL Intracatheter Q12H  . sodium chloride flush  3 mL Intravenous Q12H  . sodium chloride flush  3 mL Intravenous Q12H  . spironolactone  12.5 mg Oral Daily  . tamsulosin  0.4 mg Oral Daily  . vitamin B-12  500 mcg Oral Daily  . Vitamin D (Ergocalciferol)  50,000 Units Oral Q Thu    Infusions: . sodium chloride    . sodium chloride    . sodium chloride    . sodium chloride Stopped (01/16/20 1752)  . sodium chloride    . sodium chloride Stopped (01/22/20 1956)  . sodium chloride    . amiodarone 60 mg/hr (01/26/2020 0600)  . cefTRIAXone (ROCEPHIN)  IV Stopped (01/22/20 1220)  . heparin 1,000 Units/hr (01/24/2020 0721)  . milrinone 0.125 mcg/kg/min (01/14/2020 0600)  . vancomycin Stopped (01/22/20 1451)    PRN Medications: Place/Maintain arterial line **AND** sodium chloride, Place/Maintain arterial line **AND** sodium chloride, sodium chloride, sodium chloride, sodium chloride, acetaminophen, nitroGLYCERIN, ondansetron (ZOFRAN) IV, sodium chloride flush, sodium chloride flush, sodium chloride flush   Assessment/Plan   1. NSTEMI  -Known coronary disease. In 2019 had DES to RCA.  -LHC 01/10/2020 with multivessel disease including LM disease  - On ASA 81   - On atorvastatin 80 mg daily  - case reviewed extensively with Dr. [name] and Dr.Owen. Agree that best plan is for MV atherectomy/stenting including possible ostial LM. - Stable after IABP removal 5/15. No active  CP - holding b-blockers for now with shock on admit and need for IABP/inotropes. Can resume as tolerated - On DAPT and heparin. Will likely need Eliquis/Plavix on d/c - Plan for PCI today of LAD and LCx today w/ possible LM intervention as well w/ mechanical support.   2. Cardiogenic Shock/Acute Systolic Heart Failure in the setting of NSTEMI - ICM. ECHO 2020 EF 55%---> EF now down 40-45%  - IABP placed in cath lab 5/12. Stable after IABP removal 5/15 - On milrione 0.125 mcg. CO-OX 83%.  - Developed recurrent pulmonary edema 5/18, improved w/ IV Lasix - volume status improved/ stable today  3. Acute Hypoxic Respiratory Failure due to pulmonary edema - Sats in the ED  80s . Required Bipap.  - improved w/ diuresis  - O2 sats now stable on RA.  - Encourage IS and ambulation. No change   4. Elevated Lactic Acid  - Lactic Acid on admit 2.5 2/2 cardiogenic shock >> resolved  5. OSA  - Uses CPAP at home  -  Continue at night.   6. H/O of VT - Has ICD - on PO amiodarone 200 mg daily PTA - now on IV amiodarone for rapid AFib - Keep K> 4.0 Mg > 2.0  7. AKI  - Creatinine baseline 1-1.2 - Likely due to HF/shock - Scr 1.50 today   8. Afib w/ RVR - currently NSR  - continue amiodarone and IV heparin   9. Hypokalemia - improved. K 4.3 today   11. UTI  - 5/17 UA +. Urine culture not sent - continue vanc + ceftriaxone  - foley removed, now w/ voiding difficulty. -  Flomax added   12. Acute blood loss anemia - hgb 9.3. follow closely. No obvious sites of bleeding.     Length of Stay: [address], [place]  01/19/2020, 8:18 AM  Advanced Heart Failure Team Pager [phone] (M-F; 7a - 4p)  Please contact [facility] for night-coverage after hours (4p -7a ) and weekends on [email or web address]   Patient seen and examined with the above-signed Advanced Practice Provider and/or Housestaff. I personally reviewed laboratory data, imaging studies and relevant notes. I  independently examined the patient and formulated the important aspects of the plan. I have edited the note to reflect any of my changes or salient points. I have personally discussed the plan with the patient and/or family.  In/out of NSR <-> AF overnight. Now back in NSR. Remains on milrinone, Diuresed well on IV lasix. Creatinine up slightly. On abx for UTI. Now AF. No CP. Hgb stable on IV heparin   General:  Elderly. Sitting in chair No resp difficulty HEENT: normal Neck: supple. no JVD. Carotids 2+ bilat; no bruits. No lymphadenopathy or thryomegaly appreciated. Cor: PMI nondisplaced. Regular rate & rhythm. No rubs, gallops or murmurs. Lungs: clear Abdomen: soft, nontender, nondistended. No hepatosplenomegaly. No bruits or masses. Good bowel sounds. Extremities: no cyanosis, clubbing, rash, edema Neuro: alert & orientedx3, cranial nerves grossly intact. moves all 4 extremities w/o difficulty. Affect pleasant  I think he is optimized for high-risk PCI today. He is still requiring milrinone and seems to have very narrow euvolemic window. He will be at high risk for recurrent pulmonary edema post PCI.   Will continue amio and milrinone. Case discussed with him and his daughter at bedside.   Glori Bickers, MD  9:23 PM

## 2020-01-23 NOTE — Progress Notes (Signed)
RT Note: Pt came back from cath lab complaining of SOB. Sats were 84% on 15L HFNC. Pt placed on BIPAP 10/5, 60% and sats increased to 94%. RN at bedside. Will notify MD.

## 2020-01-23 NOTE — Progress Notes (Signed)
ANTICOAGULATION CONSULT NOTE - Follow Up Consult  Pharmacy Consult for Heparin Indication: chest pain/ACS and afib  No Known Allergies  Patient Measurements: Height: 5\' 9"  (175.3 cm) Weight: 95.3 kg (210 lb 1.6 oz) IBW/kg (Calculated) : 70.7 Heparin Dosing Weight:   Vital Signs: Temp: 97.1 F (36.2 C) (05/20 1549) Temp Source: Oral (05/20 1549) BP: 85/62 (05/20 1500) Pulse Rate: 81 (05/20 1500)  Labs: Recent Labs    01/21/20 0442 01/21/20 0957 01/22/20 0433 01/22/20 0433 01/22/20 1700 01/26/2020 0500 01/24/2020 0635 01/29/2020 0636 01/04/2020 1500  HGB  --    < > 9.2*   < > 10.3*  --  9.3*  --   --   HCT  --    < > 27.9*  --  30.7*  --  28.3*  --   --   PLT  --    < > 161  --  195  --  202  --   --   HEPARINUNFRC 0.43  --  0.38   < >  --  >2.20* 0.57  --  0.36  CREATININE 1.48*  --  1.38*  --   --   --   --  1.50*  --    < > = values in this interval not displayed.    Estimated Creatinine Clearance: 42.5 mL/min (A) (by C-G formula based on SCr of 1.5 mg/dL (H)).  Assessment:  Anticoag: Heparin IV for IABP/ACS/AF > to apix post PCI - Hep level 0.36 in goal s/p IABP removal. Hgb 9.3 Plts 202 improved. Plan PCI 5/20 for MV atherectomy/stenting including possible ostial LM. (s/p cath 5/12)   Goal of Therapy:  Heparin level 0.3-0.4 units/ml Monitor platelets by anticoagulation protocol: Yes   Plan:  -  Heparin 1000 units/hr to con't for now. - will f/u after cath lab today - Daily HL and CBC   Sharlett Lienemann S. Alford Highland, PharmD, BCPS Clinical Staff Pharmacist [email or web address] Alford Highland, [employer] 01/24/2020,4:07 PM

## 2020-01-23 NOTE — Progress Notes (Signed)
ANTICOAGULATION CONSULT NOTE  Pharmacy Consult for heparin Indication: chest pain/ACS/Afib  No Known Allergies  Patient Measurements: Height: 5\' 9"  (175.3 cm) Weight: 95.3 kg (210 lb 1.6 oz) IBW/kg (Calculated) : 70.7 Heparin Dosing Weight: 89 kg   Vital Signs: Temp: 99 F (37.2 C) (05/20 0400) Temp Source: Axillary (05/20 0400) BP: 128/54 (05/20 0700) Pulse Rate: 94 (05/20 0700)  Labs: Recent Labs    01/21/20 0442 01/21/20 0957 01/22/20 0433 01/22/20 0433 01/22/20 1700 01/14/2020 0500 01/12/2020 0635 01/22/2020 0636  HGB  --    < > 9.2*   < > 10.3*  --  9.3*  --   HCT  --    < > 27.9*  --  30.7*  --  28.3*  --   PLT  --    < > 161  --  195  --  202  --   HEPARINUNFRC 0.43  --  0.38  --   --  >2.20* 0.57  --   CREATININE 1.48*  --  1.38*  --   --   --   --  1.50*   < > = values in this interval not displayed.    Estimated Creatinine Clearance: 42.5 mL/min (A) (by C-G formula based on SCr of 1.5 mg/dL (H)).   Medical History: Past Medical History:  Diagnosis Date  . Acute respiratory failure ([place])   . BPH (benign prostatic hyperplasia)   . Cardiac arrest ([place])   . Carpal tunnel syndrome   . CKD (chronic kidney disease)   . Diabetes ([place])   . DM II (diabetes mellitus, type II), controlled ([place])   . High risk medication use   . Hyperlipidemia   . Hypertension   . Low vitamin D level   . STEMI (ST elevation myocardial infarction) ([place])    01/22/18 PCI/DES to RCA  . Systolic heart failure ([place])   . VT (ventricular tachycardia) ([facility])     Medications:  Medications Prior to Admission  Medication Sig Dispense Refill Last Dose  . amiodarone (PACERONE) 200 MG tablet Take one tablet by mouth daily Monday through Saturday.  Do NOT take on Sunday. (Patient taking differently: Take 200 mg by mouth See admin instructions. Take 200 mg by mouth in the morning on Mon/Tues/Wed/Thurs/Fri/Sat and nothing on Sunday) 90 tablet 3 01/14/2020 at am  . amLODipine (NORVASC) 10 MG tablet  Take 10 mg by mouth daily.    01/25/2020 at am  . ammonium lactate (LAC-HYDRIN) 12 % lotion Apply 1 application topically 2 (two) times daily as needed (to affected areas of legs and hands).    01/05/2020 at Unknown time  . aspirin EC 81 MG tablet Take 81 mg by mouth daily.   01/31/2020 at 0800  . atorvastatin (LIPITOR) 80 MG tablet TAKE 1 TABLET BY MOUTH ONCE DAILY AT  6  PM (Patient taking differently: Take 80 mg by mouth See admin instructions. Take 80 mg by mouth at 5 PM daily) 90 tablet 1 01/14/2020 at pm  . clopidogrel (PLAVIX) 75 MG tablet Take 1 tablet by mouth once daily (Patient taking differently: Take 75 mg by mouth daily. ) 90 tablet 3 01/07/2020 at 0800  . FERREX 150 150 MG capsule TAKE 1 CAPSULE BY MOUTH ONCE DAILY (Patient taking differently: Take 150 mg by mouth daily. ) 90 capsule 3 01/19/2020 at am  . furosemide (LASIX) 40 MG tablet Take 1 tablet (40 mg total) by mouth daily. (Patient taking differently: Take 40 mg by mouth See admin instructions.  Take 40 mg by mouth in the morning and 40 mg at 1:30 PM) 90 tablet 3 01/10/2020 at am  . gabapentin (NEURONTIN) 300 MG capsule Take 300-600 mg by mouth See admin instructions. Take 300 mg by mouth in the morning and 600 mg at 5 PM  1 01/08/2020 at am  . glimepiride (AMARYL) 2 MG tablet Take 1 mg by mouth daily with breakfast.    01/12/2020 at am  . isosorbide mononitrate (IMDUR) 30 MG 24 hr tablet Take 1 tablet by mouth once daily (Patient taking differently: Take 30 mg by mouth in the morning. ) 90 tablet 0 01/29/2020 at am  . latanoprost (XALATAN) 0.005 % ophthalmic solution Place 1 drop into both eyes at bedtime.   01/14/2020 at pm  . losartan (COZAAR) 100 MG tablet Take 1 tablet (100 mg total) by mouth daily. (Patient taking differently: Take 100 mg by mouth See admin instructions. Take 100 mg by mouth at 5 PM daily) 90 tablet 3 01/14/2020 at pm  . metoprolol succinate (TOPROL-XL) 50 MG 24 hr tablet Take 1 tablet (50 mg total) by mouth daily. Take  with or immediately following a meal. 90 tablet 3 02/03/2020 at 0800  . nitroGLYCERIN (NITROSTAT) 0.4 MG SL tablet Place 1 tablet (0.4 mg total) under the tongue every 5 (five) minutes x 3 doses as needed for chest pain. 25 tablet 1 01/18/2020 at Unknown time  . potassium chloride SA (KLOR-CON M20) 20 MEQ tablet Take 1 tablet (20 mEq total) by mouth daily. 30 tablet 3 01/19/2020 at am  . terazosin (HYTRIN) 1 MG capsule Take 1 mg by mouth See admin instructions. Take 1 mg by mouth at 5 PM daily   01/14/2020 at pm  . triamcinolone cream (KENALOG) 0.1 % Apply 1 application topically as needed (to itching areas of skin).    unk at unk  . vitamin B-12 (CYANOCOBALAMIN) 500 MCG tablet Take 500 mcg by mouth daily.   01/22/2020 at am  . Vitamin D, Ergocalciferol, (DRISDOL) 50000 units CAPS capsule Take 50,000 Units by mouth every Thursday.    01/09/2020 at Unknown time    Assessment: 66 YOM with elevated troponin, started IV heparin for ACS in the ED.   Patient s/p left and right heart cath with IABP insertion. Pt also in new onset AF RVR.  IABP removed 5/15.  Heparin level 0.57 supratherapeutic on heparin drip 1200 units/hr running through PICC line. Will need peripheral stick from phlebotomy for accurate levels. Hemoglobin low this morning at 9.3. Platelet count WNL. No bleeding, complications, or infusion issues at this time.   Goal of Therapy:  Heparin level 0.3-0.4 units/ml Monitor platelets by anticoagulation protocol: Yes   Plan:  -Decrease heparin infusion to 1000 units/hr. -Check heparin level in ~8 hours -Daily heparin level and CBC. -Monitor for s/sx of bleeding  Agnes Lawrence, PharmD PGY1 Pharmacy Resident

## 2020-01-23 NOTE — Progress Notes (Signed)
ANTICOAGULATION CONSULT NOTE - Follow Up Consult  Pharmacy Consult for Heparin Indication: chest pain/ACS and afib  No Known Allergies  Patient Measurements: Height: 5\' 9"  (175.3 cm) Weight: 95.3 kg (210 lb 1.6 oz) IBW/kg (Calculated) : 70.7 Heparin Dosing Weight:   Vital Signs: Temp: 97.1 F (36.2 C) (05/20 1549) Temp Source: Oral (05/20 1549) BP: 106/52 (05/20 1840) Pulse Rate: 77 (05/20 1948)  Labs: Recent Labs    01/21/20 0442 01/21/20 0957 01/22/20 0433 01/22/20 0433 01/22/20 1700 01/22/20 1700 01/10/2020 0500 01/13/2020 0635 01/24/2020 0635 01/24/2020 0636 01/22/2020 1500 01/22/2020 1731 01/18/2020 1855  HGB  --    < > 9.2*   < > 10.3*   < >  --  9.3*   < >  --   --  9.5* 9.6*  HCT  --    < > 27.9*   < > 30.7*   < >  --  28.3*  --   --   --  28.0* 29.5*  PLT  --    < > 161   < > 195  --   --  202  --   --   --   --  213  HEPARINUNFRC 0.43  --  0.38   < >  --   --  >2.20* 0.57  --   --  0.36  --   --   CREATININE 1.48*  --  1.38*  --   --   --   --   --   --  1.50*  --   --   --    < > = values in this interval not displayed.    Estimated Creatinine Clearance: 42.5 mL/min (A) (by C-G formula based on SCr of 1.5 mg/dL (H)).  Assessment:   Anticoag: Heparin IV for IABP/ACS/AF > to apix post PCI - Hep level 0.36 in goal s/p IABP removal. Hgb 9.3 Plts 202 improved. Plan PCI 5/20 for MV atherectomy/stenting including possible ostial LM>>resume heparin 6 hrs later at midnight per Dr. Gardiner Rhyme on call.  Goal of Therapy:  Heparin level 0.3-0.4 units/ml Monitor platelets by anticoagulation protocol: Yes   Plan:  -  Resume IV heparin 5/21 (midnight) at 1000 units/hr post-PCI - Daily HL and CBC   Rayden Scheper S. Alford Highland, PharmD, BCPS Clinical Staff Pharmacist [email or web address] Alford Highland, [employer] 01/12/2020,8:37 PM

## 2020-01-23 NOTE — Interval H&P Note (Signed)
Cath Lab Visit (complete for each Cath Lab visit)  Clinical Evaluation Leading to the Procedure:   ACS: Yes.    Non-ACS:    Anginal Classification: CCS III  Anti-ischemic medical therapy: Minimal Therapy (1 class of medications)  Non-Invasive Test Results: No non-invasive testing performed  Prior CABG: No previous CABG      History and Physical Interval Note:  01/11/2020 4:33 PM  Daryl Rodriguez  has presented today for surgery, with the diagnosis of CAD.  The various methods of treatment have been discussed with the patient and family. After consideration of risks, benefits and other options for treatment, the patient has consented to  Procedure(s): CORONARY STENT INTERVENTION (N/A) CORONARY ATHERECTOMY (N/A) as a surgical intervention.  The patient's history has been reviewed, patient examined, no change in status, stable for surgery.  I have reviewed the patient's chart and labs.  Questions were answered to the patient's satisfaction.     Sherren Mocha

## 2020-01-24 ENCOUNTER — Inpatient Hospital Stay (HOSPITAL_COMMUNITY): Payer: Medicare HMO

## 2020-01-24 LAB — LACTIC ACID, PLASMA
Lactic Acid, Venous: 0.9 mmol/L (ref 0.5–1.9)
Lactic Acid, Venous: 1.1 mmol/L (ref 0.5–1.9)

## 2020-01-24 LAB — BASIC METABOLIC PANEL
Anion gap: 12 (ref 5–15)
Anion gap: 15 (ref 5–15)
BUN: 35 mg/dL — ABNORMAL HIGH (ref 8–23)
BUN: 40 mg/dL — ABNORMAL HIGH (ref 8–23)
CO2: 21 mmol/L — ABNORMAL LOW (ref 22–32)
CO2: 20 mmol/L — ABNORMAL LOW (ref 22–32)
Calcium: 8.8 mg/dL — ABNORMAL LOW (ref 8.9–10.3)
Calcium: 8.9 mg/dL (ref 8.9–10.3)
Chloride: 97 mmol/L — ABNORMAL LOW (ref 98–111)
Chloride: 94 mmol/L — ABNORMAL LOW (ref 98–111)
Creatinine, Ser: 2.12 mg/dL — ABNORMAL HIGH (ref 0.61–1.24)
Creatinine, Ser: 2.73 mg/dL — ABNORMAL HIGH (ref 0.61–1.24)
GFR calc Af Amer: 32 mL/min — ABNORMAL LOW (ref >60)
GFR calc Af Amer: 24 mL/min — ABNORMAL LOW (ref >60)
GFR calc non Af Amer: 28 mL/min — ABNORMAL LOW (ref >60)
GFR calc non Af Amer: 21 mL/min — ABNORMAL LOW (ref >60)
Glucose, Bld: 238 mg/dL — ABNORMAL HIGH (ref 70–99)
Glucose, Bld: 241 mg/dL — ABNORMAL HIGH (ref 70–99)
Potassium: 5.2 mmol/L — ABNORMAL HIGH (ref 3.5–5.1)
Potassium: 4.9 mmol/L (ref 3.5–5.1)
Sodium: 130 mmol/L — ABNORMAL LOW (ref 135–145)
Sodium: 129 mmol/L — ABNORMAL LOW (ref 135–145)

## 2020-01-24 LAB — GLUCOSE, CAPILLARY
Glucose-Capillary: 150 mg/dL — ABNORMAL HIGH (ref 70–99)
Glucose-Capillary: 153 mg/dL — ABNORMAL HIGH (ref 70–99)
Glucose-Capillary: 177 mg/dL — ABNORMAL HIGH (ref 70–99)
Glucose-Capillary: 185 mg/dL — ABNORMAL HIGH (ref 70–99)
Glucose-Capillary: 186 mg/dL — ABNORMAL HIGH (ref 70–99)
Glucose-Capillary: 192 mg/dL — ABNORMAL HIGH (ref 70–99)
Glucose-Capillary: 193 mg/dL — ABNORMAL HIGH (ref 70–99)

## 2020-01-24 LAB — CBC
HCT: 29.2 % — ABNORMAL LOW (ref 39.0–52.0)
Hemoglobin: 9.6 g/dL — ABNORMAL LOW (ref 13.0–17.0)
MCH: 32.5 pg (ref 26.0–34.0)
MCHC: 32.9 g/dL (ref 30.0–36.0)
MCV: 99 fL (ref 80.0–100.0)
Platelets: 252 10*3/uL (ref 150–400)
RBC: 2.95 MIL/uL — ABNORMAL LOW (ref 4.22–5.81)
RDW: 14.3 % (ref 11.5–15.5)
WBC: 11 10*3/uL — ABNORMAL HIGH (ref 4.0–10.5)
nRBC: 0 % (ref 0.0–0.2)

## 2020-01-24 LAB — COOXEMETRY PANEL
Carboxyhemoglobin: 0.7 % (ref 0.5–1.5)
Carboxyhemoglobin: 1.3 % (ref 0.5–1.5)
Methemoglobin: 0.9 % (ref 0.0–1.5)
Methemoglobin: 1.2 % (ref 0.0–1.5)
O2 Saturation: 56.2 %
O2 Saturation: 73.7 %
Total hemoglobin: 9.8 g/dL — ABNORMAL LOW (ref 12.0–16.0)
Total hemoglobin: 9.9 g/dL — ABNORMAL LOW (ref 12.0–16.0)

## 2020-01-24 LAB — HEPARIN LEVEL (UNFRACTIONATED): Heparin Unfractionated: 0.35 IU/mL (ref 0.30–0.70)

## 2020-01-24 MED ORDER — FUROSEMIDE 10 MG/ML IJ SOLN
120.0000 mg | Freq: Once | INTRAVENOUS | Status: AC
  Administered 2020-01-24: 120 mg via INTRAVENOUS
  Filled 2020-01-24: qty 10

## 2020-01-24 MED FILL — Heparin Sod (Porcine)-NaCl IV Soln 1000 Unit/500ML-0.9%: INTRAVENOUS | Qty: 500 | Status: AC

## 2020-01-24 NOTE — Progress Notes (Signed)
PT Cancellation Note  Patient Details Name: Daryl Rodriguez MRN: [id number] DOB: [birth year]   Cancelled Treatment:    Reason Eval/Treat Not Completed: Medical issues which prohibited therapy(pt currently being placed on bipap and not medically appropriate)   Freida Nebel B Anurag Scarfo 01/24/2020, 12:00 PM  Bayard Males, PT Acute Rehabilitation Services Pager: [phone] Office: [phone]

## 2020-01-24 NOTE — Progress Notes (Signed)
Patient vomited in the BiPAP mask. Patient immediately took it off. Patient was placed on HFNC and wasn't able to tolerate it due to saturations in the upper 80's. Patient was placed on NRB per RN and saturations are currently 94%.

## 2020-01-24 NOTE — Progress Notes (Signed)
ANTICOAGULATION CONSULT NOTE  Pharmacy Consult for heparin Indication: chest pain/ACS/Afib  No Known Allergies  Patient Measurements: Height: 5\' 9"  (175.3 cm) Weight: 95.9 kg (211 lb 6.7 oz) IBW/kg (Calculated) : 70.7 Heparin Dosing Weight: 89 kg   Vital Signs: Temp: 98.8 F (37.1 C) (05/21 0400) Temp Source: Axillary (05/21 0400) BP: 121/51 (05/21 0600) Pulse Rate: 88 (05/21 0600)  Labs: Recent Labs    01/22/20 0433 01/22/20 1700 01/16/2020 0635 01/16/2020 0635 01/29/2020 0636 01/11/2020 1500 01/31/2020 1731 01/10/2020 1731 01/28/2020 1855 01/24/20 0459  HGB 9.2*   < > 9.3*   < >  --   --  9.5*   < > 9.6* 9.6*  HCT 27.9*   < > 28.3*   < >  --   --  28.0*  --  29.5* 29.2*  PLT 161   < > 202  --   --   --   --   --  213 252  HEPARINUNFRC 0.38   < > 0.57  --   --  0.36  --   --   --  0.35  CREATININE 1.38*  --   --   --  1.50*  --   --   --   --  2.12*   < > = values in this interval not displayed.    Estimated Creatinine Clearance: 30.2 mL/min (A) (by C-G formula based on SCr of 2.12 mg/dL (H)).   Medical History: Past Medical History:  Diagnosis Date  . Acute respiratory failure ([place])   . BPH (benign prostatic hyperplasia)   . Cardiac arrest ([place])   . Carpal tunnel syndrome   . CKD (chronic kidney disease)   . Diabetes ([place])   . DM II (diabetes mellitus, type II), controlled ([place])   . High risk medication use   . Hyperlipidemia   . Hypertension   . Low vitamin D level   . STEMI (ST elevation myocardial infarction) ([place])    01/22/18 PCI/DES to RCA  . Systolic heart failure ([place])   . VT (ventricular tachycardia) ([facility])     Medications:  Medications Prior to Admission  Medication Sig Dispense Refill Last Dose  . amiodarone (PACERONE) 200 MG tablet Take one tablet by mouth daily Monday through Saturday.  Do NOT take on Sunday. (Patient taking differently: Take 200 mg by mouth See admin instructions. Take 200 mg by mouth in the morning on Mon/Tues/Wed/Thurs/Fri/Sat and  nothing on Sunday) 90 tablet 3 01/04/2020 at am  . amLODipine (NORVASC) 10 MG tablet Take 10 mg by mouth daily.    01/05/2020 at am  . ammonium lactate (LAC-HYDRIN) 12 % lotion Apply 1 application topically 2 (two) times daily as needed (to affected areas of legs and hands).    01/27/2020 at Unknown time  . aspirin EC 81 MG tablet Take 81 mg by mouth daily.   01/07/2020 at 0800  . atorvastatin (LIPITOR) 80 MG tablet TAKE 1 TABLET BY MOUTH ONCE DAILY AT  6  PM (Patient taking differently: Take 80 mg by mouth See admin instructions. Take 80 mg by mouth at 5 PM daily) 90 tablet 1 01/14/2020 at pm  . clopidogrel (PLAVIX) 75 MG tablet Take 1 tablet by mouth once daily (Patient taking differently: Take 75 mg by mouth daily. ) 90 tablet 3 01/17/2020 at 0800  . FERREX 150 150 MG capsule TAKE 1 CAPSULE BY MOUTH ONCE DAILY (Patient taking differently: Take 150 mg by mouth daily. ) 90 capsule 3 01/22/2020 at am  .  furosemide (LASIX) 40 MG tablet Take 1 tablet (40 mg total) by mouth daily. (Patient taking differently: Take 40 mg by mouth See admin instructions. Take 40 mg by mouth in the morning and 40 mg at 1:30 PM) 90 tablet 3 01/10/2020 at am  . gabapentin (NEURONTIN) 300 MG capsule Take 300-600 mg by mouth See admin instructions. Take 300 mg by mouth in the morning and 600 mg at 5 PM  1 01/18/2020 at am  . glimepiride (AMARYL) 2 MG tablet Take 1 mg by mouth daily with breakfast.    01/13/2020 at am  . isosorbide mononitrate (IMDUR) 30 MG 24 hr tablet Take 1 tablet by mouth once daily (Patient taking differently: Take 30 mg by mouth in the morning. ) 90 tablet 0 01/07/2020 at am  . latanoprost (XALATAN) 0.005 % ophthalmic solution Place 1 drop into both eyes at bedtime.   01/14/2020 at pm  . losartan (COZAAR) 100 MG tablet Take 1 tablet (100 mg total) by mouth daily. (Patient taking differently: Take 100 mg by mouth See admin instructions. Take 100 mg by mouth at 5 PM daily) 90 tablet 3 01/14/2020 at pm  . metoprolol  succinate (TOPROL-XL) 50 MG 24 hr tablet Take 1 tablet (50 mg total) by mouth daily. Take with or immediately following a meal. 90 tablet 3 01/14/2020 at 0800  . nitroGLYCERIN (NITROSTAT) 0.4 MG SL tablet Place 1 tablet (0.4 mg total) under the tongue every 5 (five) minutes x 3 doses as needed for chest pain. 25 tablet 1 01/07/2020 at Unknown time  . potassium chloride SA (KLOR-CON M20) 20 MEQ tablet Take 1 tablet (20 mEq total) by mouth daily. 30 tablet 3 01/28/2020 at am  . terazosin (HYTRIN) 1 MG capsule Take 1 mg by mouth See admin instructions. Take 1 mg by mouth at 5 PM daily   01/14/2020 at pm  . triamcinolone cream (KENALOG) 0.1 % Apply 1 application topically as needed (to itching areas of skin).    unk at unk  . vitamin B-12 (CYANOCOBALAMIN) 500 MCG tablet Take 500 mcg by mouth daily.   01/20/2020 at am  . Vitamin D, Ergocalciferol, (DRISDOL) 50000 units CAPS capsule Take 50,000 Units by mouth every Thursday.    01/09/2020 at Unknown time    Assessment: 72 YOM presented to the ED with NSTEMI. He has a history of known coronary artery disease, and in 2019 had DES to RCA.   Patient s/p left and right heart cath with IABP insertion on 5/12. He also has new onset AF RVR this admission. IABP was removed 5/15. On 5/21, patient was taken back to the cath lab for PCI and atherectomy. Heparin was resumed temporarily held and resumed from this procedure at midnight on 5/21.  Heparin level this morning is therapeutic at 0.35 on heparin drip 1000 units/hr running through PICC line. Will need peripheral stick from phlebotomy for accurate levels. Hemoglobin low this morning but stable at 9.6. Platelet count WNL. No bleeding, complications, or infusion issues at this time.   Goal of Therapy:  Heparin level 0.3-0.4 units/ml Monitor platelets by anticoagulation protocol: Yes   Plan:  -Continue heparin IV infusion at 1000 units/hr. -Daily heparin level and CBC. -Monitor for s/sx of bleeding  Agnes Lawrence,  PharmD PGY1 Pharmacy Resident

## 2020-01-24 NOTE — Progress Notes (Signed)
Pt removed from BIPAP and placed on 15L HFNC. Sats are 95% and WOB WNL. RN at bedside.

## 2020-01-24 NOTE — Progress Notes (Signed)
RT Note: Pt placed on BIPAP 12/6, FIO2 65% due to confusion and increased WOB. RN aware.

## 2020-01-24 NOTE — Progress Notes (Signed)
PT Cancellation Note  Patient Details Name: Daryl Rodriguez MRN: [id number] DOB: [birth year]   Cancelled Treatment:    Reason Eval/Treat Not Completed: Other (comment)(pt currently with heart failure team. will reattempt)   Jiyah Torpey B Nichael Ehly 01/24/2020, 8:40 AM  Bayard Males, PT Acute Rehabilitation Services Pager: [phone] Office: [phone]

## 2020-01-24 NOTE — Progress Notes (Signed)
Advanced Heart Failure Rounding Note  PCP-Cardiologist: [name], MD     Patient Profile   86 y/o male with h.o CAD, DM2, VT s/p ICD, HTN admitted with NSTEMI and acute systolic HF. Cath by Dr. [name] revealed severe 2v CAD in LD and LCX with borderline ostial LM lesion. Filling pressures markedly elevated with low output. Lactic acid up. IABP placed and milrinone started. Echo w/ EF 40-45%. Seen by TCTS and felt not to be surgical candidate. Plan is for staged PCI.   Subjective:    5/14 was in AF w/ RVR and developed CP. Converted w/ IV amio 5/15 IABP removed 5/16 developed recurrent pulmonary edema, improved w/ IV Lasix 5/17 found to have UTI. Foley removed. Cultures pending. On broad spectrum abx, Vanc + ceftriaxone. mTemp past 24 hrs 101.6. CBC pending.   5/18 back in Afib, in the setting of UTI. Rebolused w/ Amio==>conversion back to NSR 5/20 PCI/DES with CSI of LAD  Underwent CSI and PCI/DES of LAD yesterday. Difficult procedure. Post cath developed respiratory distress requiring BIPAP.   This am is on NRB. Mild SOB. Denies CP. CVP 14. Remains on milrinone 0.20 co-ox 74%  In NSR on IV amio. Creatinine up to 2.1. K 5.2  Objective:   Weight Range: 95.9 kg Body mass index is 31.22 kg/m.   Vital Signs:   Temp:  [97.1 F (36.2 C)-99.6 F (37.6 C)] 99.6 F (37.6 C) (05/21 1157) Pulse Rate:  [13-122] 91 (05/21 1203) Resp:  [0-30] 17 (05/21 1203) BP: (84-126)/(46-98) 106/47 (05/21 1203) SpO2:  [83 %-97 %] 93 % (05/21 1203) FiO2 (%):  [60 %-65 %] 65 % (05/21 1203) Weight:  [95.9 kg] 95.9 kg (05/21 0500) Last BM Date: 01/21/20  Weight change: Filed Weights   01/22/20 0455 01/25/2020 0500 01/24/20 0500  Weight: 95.1 kg 95.3 kg 95.9 kg    Intake/Output:   Intake/Output Summary (Last 24 hours) at 01/24/2020 1341 Last data filed at 01/24/2020 1200 Gross per 24 hour  Intake 1173.81 ml  Output 800 ml  Net 373.81 ml      Physical Exam   General:  Lying  in bed. SOB on NRB HEENT: normal +NRB Neck: supple. JVP to jaw. Carotids 2+ bilat; no bruits. No lymphadenopathy or thryomegaly appreciated. Cor: PMI nondisplaced. Regular rate & rhythm. No rubs, gallops or murmurs. Lungs: + basilar crackles Abdomen: soft, nontender, nondistended. No hepatosplenomegaly. No bruits or masses. Good bowel sounds. Extremities: no cyanosis, clubbing, rash, trace edema Neuro: alert & orientedx3, cranial nerves grossly intact. moves all 4 extremities w/o difficulty. Affect pleasant   Telemetry   NSR 80-90s Personally reviewed   Labs    CBC Recent Labs    01/16/2020 1855 01/24/20 0459  WBC 10.4 11.0*  HGB 9.6* 9.6*  HCT 29.5* 29.2*  MCV 99.7 99.0  PLT 213 [id number]   Basic Metabolic Panel Recent Labs    01/09/2020 0636 01/25/2020 0636 02/03/2020 1731 01/24/20 0459  NA 130*   < > 132* 130*  K 4.3   < > 4.1 5.2*  CL 95*  --   --  97*  CO2 24  --   --  21*  GLUCOSE 241*  --   --  238*  BUN 29*  --   --  35*  CREATININE 1.50*  --   --  2.12*  CALCIUM 8.6*  --   --  8.8*   < > = values in this interval not displayed.   Liver Function Tests  No results for input(s): AST, ALT, ALKPHOS, BILITOT, PROT, ALBUMIN in the last 72 hours. No results for input(s): LIPASE, AMYLASE in the last 72 hours. Cardiac Enzymes No results for input(s): CKTOTAL, CKMB, CKMBINDEX, TROPONINI in the last 72 hours.  BNP: BNP (last 3 results) Recent Labs    01/31/2020 1403  BNP 597.8*    ProBNP (last 3 results) No results for input(s): PROBNP in the last 8760 hours.   D-Dimer No results for input(s): DDIMER in the last 72 hours. Hemoglobin A1C No results for input(s): HGBA1C in the last 72 hours. Fasting Lipid Panel No results for input(s): CHOL, HDL, LDLCALC, TRIG, CHOLHDL, LDLDIRECT in the last 72 hours. Thyroid Function Tests No results for input(s): TSH, T4TOTAL, T3FREE, THYROIDAB in the last 72 hours.  Invalid input(s): FREET3  Other results:   Imaging     CARDIAC CATHETERIZATION  Result Date: 01/10/2020 1.  Successful complex orbital atherectomy and stenting of severe calcific stenosis throughout the mid LAD, treated with a 2.25 x 34 mm resolute Onyx DES 2.  Severely elevated LVEDP Recommendations: The patient will continue on dual antiplatelet therapy with aspirin and clopidogrel for at least 12 months.  He remains clinically tenuous on IV milrinone with severely elevated LVEDP of 33 mmHg.  He is given 80 mg of IV Lasix at the completion of the procedure and his fluids are discontinued.  The patient will need staged PCI of the left circumflex at some point, with timing to be determined.  DG CHEST PORT 1 VIEW  Result Date: 01/24/2020 CLINICAL DATA:  Shortness of breath EXAM: PORTABLE CHEST 1 VIEW COMPARISON:  01/20/2020 FINDINGS: Increased bilateral opacities likely reflecting pulmonary edema. There is increased left basilar atelectasis. No significant pleural effusion. No pneumothorax. Stable cardiomediastinal contours with probable mild cardiomegaly. Left chest wall single lead ICD is present. IMPRESSION: Increased pulmonary edema and left basilar atelectasis. Electronically Signed   By: Macy Mis M.D.   On: 01/24/2020 08:56     Medications:     Scheduled Medications: . amLODipine  5 mg Oral Daily  . aspirin EC  81 mg Oral Daily  . atorvastatin  80 mg Oral q1800  . Chlorhexidine Gluconate Cloth  6 each Topical Daily  . Chlorhexidine Gluconate Cloth  6 each Topical Daily  . clopidogrel  75 mg Oral Daily  . docusate sodium  100 mg Oral Daily  . gabapentin  300 mg Oral Daily  . gabapentin  600 mg Oral Daily  . insulin aspart  0-15 Units Subcutaneous Q4H  . iron polysaccharides  150 mg Oral Daily  . latanoprost  1 drop Both Eyes QHS  . mouth rinse  15 mL Mouth Rinse BID  . melatonin  3 mg Oral QHS  . potassium chloride  40 mEq Oral Daily  . sodium chloride flush  10-40 mL Intracatheter Q12H  . sodium chloride flush  3 mL  Intravenous Q12H  . sodium chloride flush  3 mL Intravenous Q12H  . spironolactone  12.5 mg Oral Daily  . tamsulosin  0.4 mg Oral Daily  . vitamin B-12  500 mcg Oral Daily  . Vitamin D (Ergocalciferol)  50,000 Units Oral Q Thu    Infusions: . sodium chloride    . sodium chloride    . sodium chloride    . sodium chloride Stopped (01/16/20 1752)  . sodium chloride    . amiodarone 60 mg/hr (01/24/20 1200)  . heparin 1,000 Units/hr (01/24/20 1200)  . milrinone 0.2 mcg/kg/min (01/24/20  1222)    PRN Medications: Place/Maintain arterial line **AND** sodium chloride, Place/Maintain arterial line **AND** sodium chloride, sodium chloride, sodium chloride, sodium chloride, acetaminophen, nitroGLYCERIN, ondansetron (ZOFRAN) IV, sodium chloride flush, sodium chloride flush, sodium chloride flush   Assessment/Plan   1. NSTEMI  -Known coronary disease. In 2019 had DES to RCA.  -LHC 02/03/2020 with multivessel disease including LM disease  - On ASA 81   - On atorvastatin 80 mg daily  - case reviewed extensively with Dr. [name] and Dr.Owen. Agree that best plan is for MV atherectomy/stenting including possible ostial LM. - IABP removal 5/15. No active CP - Underwent CSI with PCI/DES to LAD on [id number] complicated by slow reflow  - Remains tenuous today. Volume overloaded. Creatinine up - On DAPT and heparin. Will continue - Still needs PCI Lcx but will likely have to wait a few weeks - Off b-blocker with shock  2. Cardiogenic Shock/Acute Systolic Heart Failure in the setting of NSTEMI - ICM. ECHO 2020 EF 55%---> EF now down 40-45%  - IABP placed in cath lab 5/12. Stable after IABP removal 5/15 - On milrione 0.2 mcg. CO-OX 74%. Will continue - Developed recurrent pulmonary edema post PCI - Will give lasix 120 IV x 1.  - Off b-blocker with shock  3. Acute Hypoxic Respiratory Failure due to pulmonary edema - Remains tenuous today post PCI. Volume overloaded. Creatinine up - Will give lasix 120  IV x 1. Continue NRB +/- BIPAP - Follow closely  4. Elevated Lactic Acid  - Lactic Acid on admit 2.5 2/2 cardiogenic shock >> resolved  5. OSA  - Uses CPAP at home  -  Continue at night.   6. H/O of VT - Has ICD - on PO amiodarone 200 mg daily PTA - now on IV amiodarone for rapid AFib - Keep K> 4.0 Mg > 2.0  7. AKI  - Creatinine baseline 1-1.2 - Likely due to HF/shock + CIN - Scr 1.50 -> 2.12 today  - Follow renal function closely. I worry it will get worse before it gets better  8. Afib w/ RVR - back in NSR on IV amio. Will continue  9. Hypokalemia/hyperkalemia - K 5.2  - may need lokelma  11. UTI  - 5/17 UA +. Urine culture not sent - on vanc + ceftriaxone today = day 5. Can stop  - foley removed, now w/ voiding difficulty. On flomax  12. Acute blood loss anemia - follow closely. No obvious sites of bleeding.  - hgb stable at 9.6  CRITICAL CARE Performed by: Glori Bickers  Total critical care time: 35 minutes  Critical care time was exclusive of separately billable procedures and treating other patients.  Critical care was necessary to treat or prevent imminent or life-threatening deterioration.  Critical care was time spent personally by me (independent of midlevel providers or residents) on the following activities: development of treatment plan with patient and/or surrogate as well as nursing, discussions with consultants, evaluation of patient's response to treatment, examination of patient, obtaining history from patient or surrogate, ordering and performing treatments and interventions, ordering and review of laboratory studies, ordering and review of radiographic studies, pulse oximetry and re-evaluation of patient's condition.      Length of Stay: [place], MD  01/24/2020, 1:41 PM  Advanced Heart Failure Team Pager [phone] (M-F; [place])  Please contact [facility] for night-coverage after hours (4p -7a ) and weekends on  [email or web address]

## 2020-01-24 NOTE — Progress Notes (Signed)
Pt placed on BIPAP/PCV through servo I for the night. Pt respiratory status is stable at this time. Pt unable to use his home CPAP machine at this time d/t increased oxygenation requirements. RT will continue to monitor.

## 2020-01-25 ENCOUNTER — Encounter (HOSPITAL_COMMUNITY): Payer: Self-pay | Admitting: Cardiology

## 2020-01-25 ENCOUNTER — Inpatient Hospital Stay (HOSPITAL_COMMUNITY): Payer: Medicare HMO

## 2020-01-25 LAB — GLUCOSE, CAPILLARY
Glucose-Capillary: 160 mg/dL — ABNORMAL HIGH (ref 70–99)
Glucose-Capillary: 170 mg/dL — ABNORMAL HIGH (ref 70–99)
Glucose-Capillary: 172 mg/dL — ABNORMAL HIGH (ref 70–99)
Glucose-Capillary: 172 mg/dL — ABNORMAL HIGH (ref 70–99)
Glucose-Capillary: 186 mg/dL — ABNORMAL HIGH (ref 70–99)
Glucose-Capillary: 204 mg/dL — ABNORMAL HIGH (ref 70–99)

## 2020-01-25 LAB — CULTURE, BLOOD (ROUTINE X 2)
Culture: NO GROWTH
Culture: NO GROWTH
Special Requests: ADEQUATE
Special Requests: ADEQUATE

## 2020-01-25 LAB — BASIC METABOLIC PANEL
Anion gap: 16 — ABNORMAL HIGH (ref 5–15)
BUN: 48 mg/dL — ABNORMAL HIGH (ref 8–23)
CO2: 18 mmol/L — ABNORMAL LOW (ref 22–32)
Calcium: 8.7 mg/dL — ABNORMAL LOW (ref 8.9–10.3)
Chloride: 94 mmol/L — ABNORMAL LOW (ref 98–111)
Creatinine, Ser: 3.79 mg/dL — ABNORMAL HIGH (ref 0.61–1.24)
GFR calc Af Amer: 16 mL/min — ABNORMAL LOW (ref >60)
GFR calc non Af Amer: 14 mL/min — ABNORMAL LOW (ref >60)
Glucose, Bld: 189 mg/dL — ABNORMAL HIGH (ref 70–99)
Potassium: 4.8 mmol/L (ref 3.5–5.1)
Sodium: 128 mmol/L — ABNORMAL LOW (ref 135–145)

## 2020-01-25 LAB — COOXEMETRY PANEL
Carboxyhemoglobin: 1.3 % (ref 0.5–1.5)
Methemoglobin: 1.3 % (ref 0.0–1.5)
O2 Saturation: 83.9 %
Total hemoglobin: 12 g/dL (ref 12.0–16.0)

## 2020-01-25 LAB — RENAL FUNCTION PANEL
Albumin: 2.5 g/dL — ABNORMAL LOW (ref 3.5–5.0)
Anion gap: 14 (ref 5–15)
BUN: 46 mg/dL — ABNORMAL HIGH (ref 8–23)
CO2: 21 mmol/L — ABNORMAL LOW (ref 22–32)
Calcium: 8.6 mg/dL — ABNORMAL LOW (ref 8.9–10.3)
Chloride: 94 mmol/L — ABNORMAL LOW (ref 98–111)
Creatinine, Ser: 3.59 mg/dL — ABNORMAL HIGH (ref 0.61–1.24)
GFR calc Af Amer: 17 mL/min — ABNORMAL LOW (ref >60)
GFR calc non Af Amer: 15 mL/min — ABNORMAL LOW (ref >60)
Glucose, Bld: 189 mg/dL — ABNORMAL HIGH (ref 70–99)
Phosphorus: 5.4 mg/dL — ABNORMAL HIGH (ref 2.5–4.6)
Potassium: 5.2 mmol/L — ABNORMAL HIGH (ref 3.5–5.1)
Sodium: 129 mmol/L — ABNORMAL LOW (ref 135–145)

## 2020-01-25 LAB — HEPARIN LEVEL (UNFRACTIONATED)
Heparin Unfractionated: 0.67 IU/mL (ref 0.30–0.70)
Heparin Unfractionated: 0.23 IU/mL — ABNORMAL LOW (ref 0.30–0.70)

## 2020-01-25 MED ORDER — ALTEPLASE 2 MG IJ SOLR: 2.0000 mg | Freq: Once | INTRAMUSCULAR | Status: DC | PRN

## 2020-01-25 MED ORDER — ORAL CARE MOUTH RINSE
15.0000 mL | Freq: Two times a day (BID) | OROMUCOSAL | Status: DC
  Administered 2020-01-26 – 2020-01-27 (×3): 15 mL via OROMUCOSAL

## 2020-01-25 MED ORDER — CHLORHEXIDINE GLUCONATE 0.12 % MT SOLN
15.0000 mL | Freq: Two times a day (BID) | OROMUCOSAL | Status: DC
  Administered 2020-01-25 – 2020-01-29 (×7): 15 mL via OROMUCOSAL
  Filled 2020-01-25 (×5): qty 15

## 2020-01-25 MED ORDER — SODIUM CHLORIDE 0.9 % FOR CRRT: INTRAVENOUS_CENTRAL | Status: DC | PRN

## 2020-01-25 MED ORDER — PRISMASOL BGK 4/2.5 32-4-2.5 MEQ/L REPLACEMENT SOLN: Status: DC

## 2020-01-25 MED ORDER — PRISMASOL BGK 4/2.5 32-4-2.5 MEQ/L IV SOLN: INTRAVENOUS | Status: DC

## 2020-01-25 MED ORDER — FENTANYL CITRATE (PF) 100 MCG/2ML IJ SOLN
INTRAMUSCULAR | Status: AC
  Administered 2020-01-25: 100 ug
  Filled 2020-01-25: qty 2

## 2020-01-25 MED ORDER — HEPARIN SODIUM (PORCINE) 1000 UNIT/ML DIALYSIS
1000.0000 [IU] | INTRAMUSCULAR | Status: DC | PRN
  Administered 2020-01-25 – 2020-01-29 (×2): 2400 [IU] via INTRAVENOUS_CENTRAL
  Filled 2020-01-25 (×3): qty 6

## 2020-01-25 MED ORDER — MIDAZOLAM HCL 2 MG/2ML IJ SOLN
INTRAMUSCULAR | Status: AC
  Administered 2020-01-25: 1 mg
  Filled 2020-01-25: qty 2

## 2020-01-25 NOTE — CV Procedure (Signed)
Central Venous Dailysis Catheter Insertion Procedure Note Gerardus Brink [id number] Dec 21, 1933    Procedure: Insertion of Central Venous Dialysis Catheter Indications: HD   Procedure Details Consent: Risks of procedure as well as the alternatives and risks of each were explained to the (patient/caregiver).  Consent for procedure obtained. Time Out: Verified patient identification, verified procedure, site/side was marked, verified correct patient position, special equipment/implants available, medications/allergies/relevent history reviewed, required imaging and test results available.  Performed   Maximum sterile technique was used including antiseptics, cap, gloves, gown, hand hygiene, mask and sheet. Skin prep: Chlorhexidine; local anesthetic administered A antimicrobial bonded/coated triple lumen 13FR 15cm HD catheter  was placed in the right internal jugular vein using the Seldinger technique and u/s guidance.   Evaluation Blood flow good Complications: No apparent complications Patient did tolerate procedure well. Chest X-ray ordered to verify placement.  CXR: ok   Glori Bickers, MD  11:24 AM

## 2020-01-25 NOTE — Progress Notes (Signed)
RT assisted pt with IS. Pt performed with good effort, much coaching is needed for this pt to perform properly. Pt goal is 1250 and was able to achieve 1200 w/coaching and encouragement. Pt is on 15 Lpm HFNC [place] w/sats of 92 at this time. Pt respiratory status is stable with no noted distress currently. RT will continue to monitor.

## 2020-01-25 NOTE — Progress Notes (Signed)
ANTICOAGULATION CONSULT NOTE  Pharmacy Consult for heparin Indication: chest pain/ACS/Afib  No Known Allergies  Patient Measurements: Height: 5\' 9"  (175.3 cm) Weight: 98.6 kg (217 lb 6 oz) IBW/kg (Calculated) : 70.7 Heparin Dosing Weight: 89 kg   Vital Signs: Temp: 97.9 F (36.6 C) (05/22 1600) Temp Source: Axillary (05/22 1514) BP: 110/51 (05/22 1700) Pulse Rate: 70 (05/22 1700)  Labs: Recent Labs    01/05/2020 0635 01/12/2020 0636 01/17/2020 1500 01/31/2020 1731 01/14/2020 1731 01/05/2020 1855 01/24/20 0459 01/24/20 0459 01/24/20 1528 01/25/20 0358 01/25/20 1541  HGB 9.3*   < >  --  9.5*   < > 9.6* 9.6*  --   --   --   --   HCT 28.3*   < >  --  28.0*  --  29.5* 29.2*  --   --   --   --   PLT 202  --   --   --   --  213 252  --   --   --   --   HEPARINUNFRC 0.57   < >   < >  --   --   --  0.35  --   --  0.67 0.23*  CREATININE  --    < >  --   --   --   --  2.12*   < > 2.73* 3.79* 3.59*   < > = values in this interval not displayed.    Estimated Creatinine Clearance: 18.1 mL/min (A) (by C-G formula based on SCr of 3.59 mg/dL (H)).  Assessment: 61 YOM presented to the ED with NSTEMI. He has a history of known coronary artery disease, and in 2019 had DES to RCA.   Patient s/p left and right heart cath with IABP insertion on 5/12. He also has new onset AF RVR this admission. IABP was removed 5/15. On 5/21, patient was taken back to the cath lab for PCI and atherectomy. Heparin was resumed temporarily held and resumed from this procedure at midnight on 5/21.  Heparin level this morning is supra-therapeutic at 0.67 on heparin drip 1000 units/hr running through PICC line. Will need peripheral stick from phlebotomy for accurate levels. No bleeding, complications, or infusion issues noted at this time.   This afternoon, hep lvl low at 0.23   Goal of Therapy:  Heparin level 0.3-0.4 units/ml Monitor platelets by anticoagulation protocol: Yes   Plan:  -increase heparin IV infusion  to 950 units/hr -recheck at [place], PharmD, BCPS, [place] Pharmacist [phone]  Please check AMION for all [place] numbers  01/25/2020 5:06 PM

## 2020-01-25 NOTE — Plan of Care (Signed)
  Problem: Cardiovascular: Goal: Ability to achieve and maintain adequate cardiovascular perfusion will improve Outcome: Progressing   Problem: Cardiovascular: Goal: Vascular access site(s) Level 0-1 will be maintained Outcome: Progressing

## 2020-01-25 NOTE — Progress Notes (Signed)
ANTICOAGULATION CONSULT NOTE  Pharmacy Consult for heparin Indication: chest pain/ACS/Afib  No Known Allergies  Patient Measurements: Height: 5\' 9"  (175.3 cm) Weight: 98.6 kg (217 lb 6 oz) IBW/kg (Calculated) : 70.7 Heparin Dosing Weight: 89 kg   Vital Signs: Temp: 98.6 F (37 C) (05/22 0640) Temp Source: Axillary (05/22 0640) BP: 102/52 (05/22 0700) Pulse Rate: 86 (05/22 0700)  Labs: Recent Labs    01/22/2020 0635 01/04/2020 0635 01/31/2020 0636 01/04/2020 1500 01/14/2020 1731 01/07/2020 1731 01/21/2020 1855 01/24/20 0459 01/24/20 1528 01/25/20 0358  HGB 9.3*   < >  --   --  9.5*   < > 9.6* 9.6*  --   --   HCT 28.3*   < >  --   --  28.0*  --  29.5* 29.2*  --   --   PLT 202  --   --   --   --   --  213 252  --   --   HEPARINUNFRC 0.57  --    < > 0.36  --   --   --  0.35  --  0.67  CREATININE  --   --    < >  --   --   --   --  2.12* 2.73* 3.79*   < > = values in this interval not displayed.    Estimated Creatinine Clearance: 17.1 mL/min (A) (by C-G formula based on SCr of 3.79 mg/dL (H)).   Medical History: Past Medical History:  Diagnosis Date  . Acute respiratory failure ([place])   . BPH (benign prostatic hyperplasia)   . Cardiac arrest ([place])   . Carpal tunnel syndrome   . CKD (chronic kidney disease)   . Diabetes ([place])   . DM II (diabetes mellitus, type II), controlled ([place])   . High risk medication use   . Hyperlipidemia   . Hypertension   . Low vitamin D level   . STEMI (ST elevation myocardial infarction) ([place])    01/22/18 PCI/DES to RCA  . Systolic heart failure ([place])   . VT (ventricular tachycardia) ([facility])     Medications:  Medications Prior to Admission  Medication Sig Dispense Refill Last Dose  . amiodarone (PACERONE) 200 MG tablet Take one tablet by mouth daily Monday through Saturday.  Do NOT take on Sunday. (Patient taking differently: Take 200 mg by mouth See admin instructions. Take 200 mg by mouth in the morning on Mon/Tues/Wed/Thurs/Fri/Sat and nothing  on Sunday) 90 tablet 3 01/09/2020 at am  . amLODipine (NORVASC) 10 MG tablet Take 10 mg by mouth daily.    01/21/2020 at am  . ammonium lactate (LAC-HYDRIN) 12 % lotion Apply 1 application topically 2 (two) times daily as needed (to affected areas of legs and hands).    01/07/2020 at Unknown time  . aspirin EC 81 MG tablet Take 81 mg by mouth daily.   01/21/2020 at 0800  . atorvastatin (LIPITOR) 80 MG tablet TAKE 1 TABLET BY MOUTH ONCE DAILY AT  6  PM (Patient taking differently: Take 80 mg by mouth See admin instructions. Take 80 mg by mouth at 5 PM daily) 90 tablet 1 01/14/2020 at pm  . clopidogrel (PLAVIX) 75 MG tablet Take 1 tablet by mouth once daily (Patient taking differently: Take 75 mg by mouth daily. ) 90 tablet 3 01/29/2020 at 0800  . FERREX 150 150 MG capsule TAKE 1 CAPSULE BY MOUTH ONCE DAILY (Patient taking differently: Take 150 mg by mouth daily. ) 90 capsule  3 01/14/2020 at am  . furosemide (LASIX) 40 MG tablet Take 1 tablet (40 mg total) by mouth daily. (Patient taking differently: Take 40 mg by mouth See admin instructions. Take 40 mg by mouth in the morning and 40 mg at 1:30 PM) 90 tablet 3 01/05/2020 at am  . gabapentin (NEURONTIN) 300 MG capsule Take 300-600 mg by mouth See admin instructions. Take 300 mg by mouth in the morning and 600 mg at 5 PM  1 01/26/2020 at am  . glimepiride (AMARYL) 2 MG tablet Take 1 mg by mouth daily with breakfast.    01/16/2020 at am  . isosorbide mononitrate (IMDUR) 30 MG 24 hr tablet Take 1 tablet by mouth once daily (Patient taking differently: Take 30 mg by mouth in the morning. ) 90 tablet 0 01/14/2020 at am  . latanoprost (XALATAN) 0.005 % ophthalmic solution Place 1 drop into both eyes at bedtime.   01/14/2020 at pm  . losartan (COZAAR) 100 MG tablet Take 1 tablet (100 mg total) by mouth daily. (Patient taking differently: Take 100 mg by mouth See admin instructions. Take 100 mg by mouth at 5 PM daily) 90 tablet 3 01/14/2020 at pm  . metoprolol succinate  (TOPROL-XL) 50 MG 24 hr tablet Take 1 tablet (50 mg total) by mouth daily. Take with or immediately following a meal. 90 tablet 3 01/09/2020 at 0800  . nitroGLYCERIN (NITROSTAT) 0.4 MG SL tablet Place 1 tablet (0.4 mg total) under the tongue every 5 (five) minutes x 3 doses as needed for chest pain. 25 tablet 1 01/04/2020 at Unknown time  . potassium chloride SA (KLOR-CON M20) 20 MEQ tablet Take 1 tablet (20 mEq total) by mouth daily. 30 tablet 3 01/14/2020 at am  . terazosin (HYTRIN) 1 MG capsule Take 1 mg by mouth See admin instructions. Take 1 mg by mouth at 5 PM daily   01/14/2020 at pm  . triamcinolone cream (KENALOG) 0.1 % Apply 1 application topically as needed (to itching areas of skin).    unk at unk  . vitamin B-12 (CYANOCOBALAMIN) 500 MCG tablet Take 500 mcg by mouth daily.   01/24/2020 at am  . Vitamin D, Ergocalciferol, (DRISDOL) 50000 units CAPS capsule Take 50,000 Units by mouth every Thursday.    01/09/2020 at Unknown time    Assessment: 7 YOM presented to the ED with NSTEMI. He has a history of known coronary artery disease, and in 2019 had DES to RCA.   Patient s/p left and right heart cath with IABP insertion on 5/12. He also has new onset AF RVR this admission. IABP was removed 5/15. On 5/21, patient was taken back to the cath lab for PCI and atherectomy. Heparin was resumed temporarily held and resumed from this procedure at midnight on 5/21.  Heparin level this morning is supra-therapeutic at 0.67 on heparin drip 1000 units/hr running through PICC line. Will need peripheral stick from phlebotomy for accurate levels. No bleeding, complications, or infusion issues noted at this time.   Goal of Therapy:  Heparin level 0.3-0.4 units/ml Monitor platelets by anticoagulation protocol: Yes   Plan:  -Reduce heparin IV infusion to 900 units/hr -Check 8 hour heparin level. -Daily heparin level and CBC. -Monitor for s/sx of bleeding  Vertis Kelch, PharmD, [facility] PGY2 Cardiology  Pharmacy Resident Phone [phone] 01/25/2020       7:21 AM  Please check [email or web address] for unit-specific pharmacist phone numbers

## 2020-01-25 NOTE — Consult Note (Signed)
Renal Service Consult Note [facility]  Daryl Rodriguez 01/25/2020 [name] Requesting Physician:  Dr [name]  Reason for Consult:   HPI: The patient is a 86 y.o. year-old w/ hx of syst CHF , VT sp ICD, afib/ flutter, MI, HTN, HL, DM2, CKD, BPH presented 5/12 w/ SOB, hypoxia ruled in for NSTEMI and had acute syst CHF. Cath showed severe 2v CAD, marked ^filling pressures and low CO. Started on milrinone and IABP placed (removed 5/15), ECHO 40-45%.  Not surg candidate for TCTS. Plan is for stage PCI. Course c/b UTI, pulm edema and af w/ RVR.  Felt to be optimized and went to cath lab on 5/20 where pt underwent cor atherectomy and stenting throughout the mid LAD.  LVEDP very high at 41mmHg. Will still need PIC of LCX. Post cath creat climbing up to 2.7 yest and 3.7 today. UOP down to 875 on 5/20 and 500 cc yest. Asked to see for AoCRF.   Pt is up in a chair, some SOB, and leg swelling.  No CP, no abd pain, eating "not great", no n/v/d.    ROS  denies CP  no joint pain   no HA  no blurry vision  no rash  no diarrhea  no nausea/ vomiting     Past Medical History  Past Medical History:  Diagnosis Date  . Acute respiratory failure ([place])   . BPH (benign prostatic hyperplasia)   . Cardiac arrest ([place])   . Carpal tunnel syndrome   . CKD (chronic kidney disease)   . Diabetes ([place])   . DM II (diabetes mellitus, type II), controlled ([place])   . High risk medication use   . Hyperlipidemia   . Hypertension   . Low vitamin D level   . STEMI (ST elevation myocardial infarction) ([place])    01/22/18 PCI/DES to RCA  . Systolic heart failure ([place])   . VT (ventricular tachycardia) [facility])    Past Surgical History  Past Surgical History:  Procedure Laterality Date  . BACK SURGERY    . CORONARY ATHERECTOMY N/A 02/02/2020   Procedure: CORONARY ATHERECTOMY;  Surgeon: Sherren Mocha, MD;  Location: [place] [facility];  Service: Cardiovascular;  Laterality: N/A;  . CORONARY STENT  INTERVENTION N/A 01/22/2018   Procedure: CORONARY STENT INTERVENTION;  Surgeon: Troy Sine, MD;  Location: [place] [facility];  Service: Cardiovascular;  Laterality: N/A;  . CORONARY STENT INTERVENTION N/A 01/19/2020   Procedure: CORONARY STENT INTERVENTION;  Surgeon: Sherren Mocha, MD;  Location: [place] [facility];  Service: Cardiovascular;  Laterality: N/A;  . CORONARY/GRAFT ACUTE MI REVASCULARIZATION N/A 01/22/2018   Procedure: Coronary/Graft Acute MI Revascularization;  Surgeon: Troy Sine, MD;  Location: [place] [facility];  Service: Cardiovascular;  Laterality: N/A;  . IABP INSERTION N/A 01/16/2020   Procedure: IABP Insertion;  Surgeon: Leonie Man, MD;  Location: [place] [facility];  Service: Cardiovascular;  Laterality: N/A;  . ICD IMPLANT N/A 01/30/2018   Procedure: ICD IMPLANT;  Surgeon: Evans Lance, MD;  Location: [place] [facility];  Service: Cardiovascular;  Laterality: N/A;  . LEFT HEART CATH N/A 01/09/2020   Procedure: Left Heart Cath;  Surgeon: Sherren Mocha, MD;  Location: [place] [facility];  Service: Cardiovascular;  Laterality: N/A;  . LEFT HEART CATH AND CORONARY ANGIOGRAPHY N/A 01/22/2018   Procedure: LEFT HEART CATH AND CORONARY ANGIOGRAPHY;  Surgeon: Troy Sine, MD;  Location: [place] [facility];  Service: Cardiovascular;  Laterality: N/A;  .  LEFT HEART CATH AND CORONARY ANGIOGRAPHY N/A 01/25/2018   Procedure: LEFT HEART CATH AND CORONARY ANGIOGRAPHY - RELOOK;  Surgeon: Wellington Hampshire, MD;  Location: [place] [facility];  Service: Cardiovascular;  Laterality: N/A;  . RIGHT/LEFT HEART CATH AND CORONARY ANGIOGRAPHY N/A 02/01/2020   Procedure: RIGHT/LEFT HEART CATH AND CORONARY ANGIOGRAPHY;  Surgeon: Leonie Man, MD;  Location: [place] [facility];  Service: Cardiovascular;  Laterality: N/A;  . WRIST SURGERY     Family History  Family History  Problem Relation Age of Onset  . Heart attack Father 44  . Heart attack Mother   . Stroke Sister     Social History  reports that he has quit smoking. He has never used smokeless tobacco. He reports previous alcohol use. He reports previous drug use. Allergies No Known Allergies Home medications Prior to Admission medications   Medication Sig Start Date End Date Taking? Authorizing Provider  amiodarone (PACERONE) 200 MG tablet Take one tablet by mouth daily Monday through Saturday.  Do NOT take on Sunday. Patient taking differently: Take 200 mg by mouth See admin instructions. Take 200 mg by mouth in the morning on Mon/Tues/Wed/Thurs/Fri/Sat and nothing on Sunday 03/04/19  Yes Evans Lance, MD  amLODipine (NORVASC) 10 MG tablet Take 10 mg by mouth daily.  09/12/19  Yes [provider]  ammonium lactate (LAC-HYDRIN) 12 % lotion Apply 1 application topically 2 (two) times daily as needed (to affected areas of legs and hands).  12/19/17  Yes [provider]  aspirin EC 81 MG tablet Take 81 mg by mouth daily.   Yes [provider]  atorvastatin (LIPITOR) 80 MG tablet TAKE 1 TABLET BY MOUTH ONCE DAILY AT  6  PM Patient taking differently: Take 80 mg by mouth See admin instructions. Take 80 mg by mouth at 5 PM daily 07/16/18  Yes Park Liter, MD  clopidogrel (PLAVIX) 75 MG tablet Take 1 tablet by mouth once daily Patient taking differently: Take 75 mg by mouth daily.  10/30/19  Yes Park Liter, MD  FERREX 150 150 MG capsule TAKE 1 CAPSULE BY MOUTH ONCE DAILY Patient taking differently: Take 150 mg by mouth daily.  03/21/18  Yes Cheryln Manly, NP  furosemide (LASIX) 40 MG tablet Take 1 tablet (40 mg total) by mouth daily. Patient taking differently: Take 40 mg by mouth See admin instructions. Take 40 mg by mouth in the morning and 40 mg at 1:30 PM 05/09/18  Yes Evans Lance, MD  gabapentin (NEURONTIN) 300 MG capsule Take 300-600 mg by mouth See admin instructions. Take 300 mg by mouth in the morning and 600 mg at 5 PM 12/26/17  Yes [provider]   glimepiride (AMARYL) 2 MG tablet Take 1 mg by mouth daily with breakfast.    Yes [provider]  isosorbide mononitrate (IMDUR) 30 MG 24 hr tablet Take 1 tablet by mouth once daily Patient taking differently: Take 30 mg by mouth in the morning.  01/02/19  Yes Park Liter, MD  latanoprost (XALATAN) 0.005 % ophthalmic solution Place 1 drop into both eyes at bedtime.   Yes [provider]  losartan (COZAAR) 100 MG tablet Take 1 tablet (100 mg total) by mouth daily. Patient taking differently: Take 100 mg by mouth See admin instructions. Take 100 mg by mouth at 5 PM daily 03/04/19 06/04/20 Yes Evans Lance, MD  metoprolol succinate (TOPROL-XL) 50 MG 24 hr tablet Take 1 tablet (50 mg  total) by mouth daily. Take with or immediately following a meal. 03/04/19 06/04/20 Yes Evans Lance, MD  nitroGLYCERIN (NITROSTAT) 0.4 MG SL tablet Place 1 tablet (0.4 mg total) under the tongue every 5 (five) minutes x 3 doses as needed for chest pain. 02/01/18  Yes Reino Bellis B, NP  potassium chloride SA (KLOR-CON M20) 20 MEQ tablet Take 1 tablet (20 mEq total) by mouth daily. 02/12/18 01/24/2020 Yes Lendon Colonel, NP  terazosin (HYTRIN) 1 MG capsule Take 1 mg by mouth See admin instructions. Take 1 mg by mouth at 5 PM daily   Yes [provider]  triamcinolone cream (KENALOG) 0.1 % Apply 1 application topically as needed (to itching areas of skin).  12/05/17  Yes [provider]  vitamin B-12 (CYANOCOBALAMIN) 500 MCG tablet Take 500 mcg by mouth daily.   Yes [provider]  Vitamin D, Ergocalciferol, (DRISDOL) 50000 units CAPS capsule Take 50,000 Units by mouth every Thursday.    Yes [provider]     Vitals:   01/25/20 0600 01/25/20 0640 01/25/20 0700 01/25/20 0801  BP: (!) 105/58  (!) 102/52 (!) 108/55  Pulse: 87  86 88  Resp: 19  (!) 21 19  Temp:  98.6 F (37 C)    TempSrc:  Axillary    SpO2: 94%  94% 94%  Weight: 98.6 kg     Height:        Exam Gen alert, up in chair, nasal O2 No rash, cyanosis or gangrene Sclera anicteric, throat clear  +JVD Chest bibasilar soft rales, no bronch BS RRR no MRG Abd soft ntnd no mass or ascites +bs GU normal male MS no joint effusions or deformity Ext diffuse 2+ edema of UE's, hips and pretib  Neuro is alert, Ox 3 , nf    Home meds: - amio 200/ norvasc 10/ asa 81/ lipitor 80/ plavix 75/ lasix 40 bid/ imdur 30/ sl ntg prn/ kdur 20 qd/ metoprolol xl 50 qd/ losartan 100 qd - amaryl 1mg  am - gabapentin 300 qd  - prn's/ vitamins/ supplements     CXR 5/21 - ^'d pulm edema / effusions   BP's 108/55 on IV milrinone and amidarone, no pressors    UA 5/17 - many bact, > 50 rbc/ wbc    Rocephin d# 5     IV vanc sp 4d , dc'd on 5/21     I/O since admit 15L in and 18 L out = - 3L net    Wt's = 98kg admit >> nadir 92kg on 5/18 >>  98kg otday     Baseline creat = 1.2- 1.6 , 2019- 2021, CKD III  Assessment/ Plan: 1. AoCKD3 - related to contrast, CKD3, syst CHF.  Declining UOP and worsening pulm edema, would recommend RRT.  In ICU situation w/ soft BP's and on inotropes would recommend CRRT as best modality.  Pt agrees to short-term RRT, not sure he would do it long-term though.  Have d/w cardiology. Plan is for CRRT, get vol/ solute down.  2. CAD/ NSTEMI - sp PCI to LAD. Still needs work on LCX.  3. Syst CHF - LV 40-45%, ^^LVEDP by cath  4. Atrial fibrillation - on IV hep 5. DM2 - on oral agents at home 6. VTach sp ICD 7. HTN - bp's soft now      Kelly Splinter  MD 01/25/2020, 8:21 AM  Recent Labs  Lab 01/10/2020 1855 01/24/20 0459  WBC 10.4 11.0*  HGB 9.6* 9.6*  Recent Labs  Lab 01/24/20 1528 01/25/20 0358  K 4.9 4.8  BUN 40* 48*  CREATININE 2.73* 3.79*  CALCIUM 8.9 8.7*

## 2020-01-25 NOTE — Progress Notes (Addendum)
Advanced Heart Failure Rounding Note  PCP-Cardiologist: [name], MD     Patient Profile   86 y/o male with h.o CAD, DM2, VT s/p ICD, HTN admitted with NSTEMI and acute systolic HF. Cath by Dr. [name] revealed severe 2v CAD in LD and LCX with borderline ostial LM lesion. Filling pressures markedly elevated with low output. Lactic acid up. IABP placed and milrinone started. Echo w/ EF 40-45%. Seen by TCTS and felt not to be surgical candidate. Plan is for staged PCI.   Subjective:    5/14 was in AF w/ RVR and developed CP. Converted w/ IV amio 5/15 IABP removed 5/16 developed recurrent pulmonary edema, improved w/ IV Lasix 5/17 found to have UTI. Foley removed. Cultures pending. On broad spectrum abx, Vanc + ceftriaxone. mTemp past 24 hrs 101.6. CBC pending.   5/18 back in Afib, in the setting of UTI. Rebolused w/ Amio==>conversion back to NSR 5/20 PCI/DES with CSI of LAD  Underwent CSI and PCI/DES of LAD on 5/20. Difficult but successful procedure with periods of no reflow. Post cath developed respiratory distress requiring BIPAP.   Remains on milrinone. Still SOB but stable on [place] this am. CVP up to 15. Weight up 6 pounds. Creatinine continues to climb. Now 3.8 with progressive acidosis. Co-ox 84%. K 4.8  Objective:   Weight Range: 98.6 kg Body mass index is 32.1 kg/m.   Vital Signs:   Temp:  [98.6 F (37 C)-99.6 F (37.6 C)] 98.6 F (37 C) (05/22 0640) Pulse Rate:  [86-99] 88 (05/22 0801) Resp:  [16-25] 19 (05/22 0801) BP: (101-126)/(46-68) 108/55 (05/22 0801) SpO2:  [92 %-96 %] 94 % (05/22 0801) FiO2 (%):  [50 %-80 %] 50 % (05/22 0414) Weight:  [98.6 kg] 98.6 kg (05/22 0600) Last BM Date: 01/24/20  Weight change: Filed Weights   01/17/2020 0500 01/24/20 0500 01/25/20 0600  Weight: 95.3 kg 95.9 kg 98.6 kg    Intake/Output:   Intake/Output Summary (Last 24 hours) at 01/25/2020 0930 Last data filed at 01/25/2020 0801 Gross per 24 hour  Intake 1161.07 ml   Output 0 ml  Net 1161.07 ml      Physical Exam   General:  Sitting up. Sats stable on [place] HEENT: normal Neck: supple. JVP to jaw Carotids 2+ bilat; no bruits. No lymphadenopathy or thryomegaly appreciated. Cor: PMI nondisplaced. Regular rate & rhythm. No rubs, gallops or murmurs. Lungs: + crackles Abdomen: soft, nontender, nondistended. No hepatosplenomegaly. No bruits or masses. Good bowel sounds. Extremities: no cyanosis, clubbing, rash, 2-3+ edema Neuro: alert & orientedx3, cranial nerves grossly intact. moves all 4 extremities w/o difficulty. Affect pleasant    Telemetry   NSR 80-90s Personally reviewed   Labs    CBC Recent Labs    01/24/2020 1855 01/24/20 0459  WBC 10.4 11.0*  HGB 9.6* 9.6*  HCT 29.5* 29.2*  MCV 99.7 99.0  PLT 213 [id number]   Basic Metabolic Panel Recent Labs    01/24/20 1528 01/25/20 0358  NA 129* 128*  K 4.9 4.8  CL 94* 94*  CO2 20* 18*  GLUCOSE 241* 189*  BUN 40* 48*  CREATININE 2.73* 3.79*  CALCIUM 8.9 8.7*   Liver Function Tests No results for input(s): AST, ALT, ALKPHOS, BILITOT, PROT, ALBUMIN in the last 72 hours. No results for input(s): LIPASE, AMYLASE in the last 72 hours. Cardiac Enzymes No results for input(s): CKTOTAL, CKMB, CKMBINDEX, TROPONINI in the last 72 hours.  BNP: BNP (last 3 results) Recent Labs  02/02/2020 1403  BNP 597.8*    ProBNP (last 3 results) No results for input(s): PROBNP in the last 8760 hours.   D-Dimer No results for input(s): DDIMER in the last 72 hours. Hemoglobin A1C No results for input(s): HGBA1C in the last 72 hours. Fasting Lipid Panel No results for input(s): CHOL, HDL, LDLCALC, TRIG, CHOLHDL, LDLDIRECT in the last 72 hours. Thyroid Function Tests No results for input(s): TSH, T4TOTAL, T3FREE, THYROIDAB in the last 72 hours.  Invalid input(s): FREET3  Other results:   Imaging    No results found.   Medications:     Scheduled Medications: . amLODipine  5 mg Oral Daily   . aspirin EC  81 mg Oral Daily  . atorvastatin  80 mg Oral q1800  . Chlorhexidine Gluconate Cloth  6 each Topical Daily  . Chlorhexidine Gluconate Cloth  6 each Topical Daily  . clopidogrel  75 mg Oral Daily  . docusate sodium  100 mg Oral Daily  . gabapentin  300 mg Oral Daily  . gabapentin  600 mg Oral Daily  . insulin aspart  0-15 Units Subcutaneous Q4H  . iron polysaccharides  150 mg Oral Daily  . latanoprost  1 drop Both Eyes QHS  . mouth rinse  15 mL Mouth Rinse BID  . melatonin  3 mg Oral QHS  . potassium chloride  40 mEq Oral Daily  . sodium chloride flush  10-40 mL Intracatheter Q12H  . sodium chloride flush  3 mL Intravenous Q12H  . sodium chloride flush  3 mL Intravenous Q12H  . spironolactone  12.5 mg Oral Daily  . tamsulosin  0.4 mg Oral Daily  . vitamin B-12  500 mcg Oral Daily  . Vitamin D (Ergocalciferol)  50,000 Units Oral Q Thu    Infusions: .  prismasol BGK 4/2.5    .  prismasol BGK 4/2.5    . sodium chloride    . sodium chloride    . sodium chloride    . sodium chloride Stopped (01/16/20 1752)  . sodium chloride    . amiodarone 60 mg/hr (01/25/20 0800)  . heparin 900 Units/hr (01/25/20 0800)  . milrinone 0.2 mcg/kg/min (01/25/20 0800)  . prismasol BGK 4/2.5      PRN Medications: Place/Maintain arterial line **AND** sodium chloride, Place/Maintain arterial line **AND** sodium chloride, sodium chloride, sodium chloride, sodium chloride, acetaminophen, alteplase, heparin, nitroGLYCERIN, ondansetron (ZOFRAN) IV, sodium chloride, sodium chloride flush, sodium chloride flush, sodium chloride flush   Assessment/Plan   1. NSTEMI  -Known coronary disease. In 2019 had DES to RCA.  -LHC 01/24/2020 with multivessel disease including LM disease  - On ASA 81   - On atorvastatin 80 mg daily  - case reviewed extensively with Dr. [name] and Dr.Owen. Agree that best plan is for MV atherectomy/stenting including possible ostial LM. - IABP removal 5/15. No active  CP - Underwent CSI with PCI/DES to LAD on [id number] complicated by slow reflow  - Remains tenuous today. Volume overloaded. Creatinine up - On DAPT and heparin - Still needs PCI Lcx but will likely have to wait a few weeks - Off b-blocker with shock  2. Cardiogenic Shock/Acute Systolic Heart Failure in the setting of NSTEMI - ICM. ECHO 2020 EF 55%---> EF now down 40-45%  - IABP placed in cath lab 5/12. Stable after IABP removal 5/15 - On milrione 0.2 mcg. CO-OX 84%. Will continue - Developed recurrent pulmonary edema post PCI - Volume status increasing in setting of AKI. No response to  high-dose lasi - Plan CRRT  3. Acute Hypoxic Respiratory Failure due to pulmonary edema - As above. Remains volume overloaded. Creatinine up - Not responding to high-dose diuretics -> plan CRRT  4. AKI  - Creatinine baseline 1-1.2 -> 2.7 -> 3.8 - Likely due to HF/shock/ATN + CIN - We discussed option of proceeding with CRRT or watchful waiting. Given rate of rise of creatinine suspect he will continue to get worse for several more days at least and will have progressive respiratory failure.  - He wants to proceed with CRRT but would not want long-term HD at this point - Appreciate Renal Consult - Will get renal u/s  5. OSA  - Uses CPAP at home  -  Continue at night.   6. H/O of VT - Has ICD - on PO amiodarone 200 mg daily PTA - now on IV amiodarone for rapid AFib - Keep K> 4.0 Mg > 2.0  8. Afib w/ RVR - back in NSR on IV amio. Will continue - Continue heparin  9. Hypokalemia/hyperkalemia - K 4.8 - will get CRRT 11. UTI  - 5/17 UA +. Urine culture not sent - Treated with vanc + ceftriaxone x 5 days - foley removed, now w/ voiding difficulty. On flomax  12. Acute blood loss anemia - follow closely. No obvious sites of bleeding.  - check CBC  13. DNR/DNI - wee discussed code status with him and his daughter. Would not want intubation or CPR. (shock ok)   CRITICAL CARE Performed by:  Glori Bickers  Total critical care time: 45 minutes  Critical care time was exclusive of separately billable procedures and treating other patients.  Critical care was necessary to treat or prevent imminent or life-threatening deterioration.  Critical care was time spent personally by me (independent of midlevel providers or residents) on the following activities: development of treatment plan with patient and/or surrogate as well as nursing, discussions with consultants, evaluation of patient's response to treatment, examination of patient, obtaining history from patient or surrogate, ordering and performing treatments and interventions, ordering and review of laboratory studies, ordering and review of radiographic studies, pulse oximetry and re-evaluation of patient's condition.      Length of Stay: [place], MD  01/25/2020, 9:30 AM  Advanced Heart Failure Team Pager [phone] (M-F; 7a - 4p)  Please contact [facility] for night-coverage after hours (4p -7a ) and weekends on [email or web address]

## 2020-01-26 LAB — RENAL FUNCTION PANEL
Albumin: 2.4 g/dL — ABNORMAL LOW (ref 3.5–5.0)
Albumin: 2.6 g/dL — ABNORMAL LOW (ref 3.5–5.0)
Anion gap: 13 (ref 5–15)
Anion gap: 13 (ref 5–15)
BUN: 32 mg/dL — ABNORMAL HIGH (ref 8–23)
BUN: 23 mg/dL (ref 8–23)
CO2: 23 mmol/L (ref 22–32)
CO2: 22 mmol/L (ref 22–32)
Calcium: 8.4 mg/dL — ABNORMAL LOW (ref 8.9–10.3)
Calcium: 8.6 mg/dL — ABNORMAL LOW (ref 8.9–10.3)
Chloride: 96 mmol/L — ABNORMAL LOW (ref 98–111)
Chloride: 98 mmol/L (ref 98–111)
Creatinine, Ser: 2.83 mg/dL — ABNORMAL HIGH (ref 0.61–1.24)
Creatinine, Ser: 2.23 mg/dL — ABNORMAL HIGH (ref 0.61–1.24)
GFR calc Af Amer: 23 mL/min — ABNORMAL LOW (ref >60)
GFR calc Af Amer: 30 mL/min — ABNORMAL LOW (ref >60)
GFR calc non Af Amer: 20 mL/min — ABNORMAL LOW (ref >60)
GFR calc non Af Amer: 26 mL/min — ABNORMAL LOW (ref >60)
Glucose, Bld: 172 mg/dL — ABNORMAL HIGH (ref 70–99)
Glucose, Bld: 186 mg/dL — ABNORMAL HIGH (ref 70–99)
Phosphorus: 4.3 mg/dL (ref 2.5–4.6)
Phosphorus: 3.2 mg/dL (ref 2.5–4.6)
Potassium: 4.8 mmol/L (ref 3.5–5.1)
Potassium: 5 mmol/L (ref 3.5–5.1)
Sodium: 132 mmol/L — ABNORMAL LOW (ref 135–145)
Sodium: 133 mmol/L — ABNORMAL LOW (ref 135–145)

## 2020-01-26 LAB — GLUCOSE, CAPILLARY
Glucose-Capillary: 168 mg/dL — ABNORMAL HIGH (ref 70–99)
Glucose-Capillary: 157 mg/dL — ABNORMAL HIGH (ref 70–99)
Glucose-Capillary: 135 mg/dL — ABNORMAL HIGH (ref 70–99)
Glucose-Capillary: 156 mg/dL — ABNORMAL HIGH (ref 70–99)
Glucose-Capillary: 163 mg/dL — ABNORMAL HIGH (ref 70–99)

## 2020-01-26 LAB — CBC
HCT: 24.5 % — ABNORMAL LOW (ref 39.0–52.0)
Hemoglobin: 8 g/dL — ABNORMAL LOW (ref 13.0–17.0)
MCH: 32.4 pg (ref 26.0–34.0)
MCHC: 32.7 g/dL (ref 30.0–36.0)
MCV: 99.2 fL (ref 80.0–100.0)
Platelets: 233 10*3/uL (ref 150–400)
RBC: 2.47 MIL/uL — ABNORMAL LOW (ref 4.22–5.81)
RDW: 14.9 % (ref 11.5–15.5)
WBC: 14.4 10*3/uL — ABNORMAL HIGH (ref 4.0–10.5)
nRBC: 0 % (ref 0.0–0.2)

## 2020-01-26 LAB — HEPARIN LEVEL (UNFRACTIONATED)
Heparin Unfractionated: 0.22 IU/mL — ABNORMAL LOW (ref 0.30–0.70)
Heparin Unfractionated: 0.32 IU/mL (ref 0.30–0.70)
Heparin Unfractionated: 0.32 IU/mL (ref 0.30–0.70)

## 2020-01-26 LAB — COOXEMETRY PANEL
Carboxyhemoglobin: 1 % (ref 0.5–1.5)
Methemoglobin: 1 % (ref 0.0–1.5)
O2 Saturation: 56.2 %
Total hemoglobin: 13.1 g/dL (ref 12.0–16.0)

## 2020-01-26 LAB — APTT: aPTT: 68 seconds — ABNORMAL HIGH (ref 24–36)

## 2020-01-26 LAB — MAGNESIUM: Magnesium: 2.1 mg/dL (ref 1.7–2.4)

## 2020-01-26 MED ORDER — NOREPINEPHRINE 16 MG/250ML-% IV SOLN
0.0000 ug/min | INTRAVENOUS | Status: AC
  Administered 2020-01-26: 5 ug/min via INTRAVENOUS
  Administered 2020-01-27: 10 ug/min via INTRAVENOUS
  Administered 2020-01-30: 15 ug/min via INTRAVENOUS
  Filled 2020-01-26 (×5): qty 250

## 2020-01-26 MED ORDER — MIDODRINE HCL 5 MG PO TABS
5.0000 mg | ORAL_TABLET | Freq: Three times a day (TID) | ORAL | Status: DC
  Administered 2020-01-26 – 2020-01-27 (×3): 5 mg via ORAL
  Filled 2020-01-26 (×3): qty 1

## 2020-01-26 MED ORDER — SORBITOL 70 % SOLN
30.0000 mL | Freq: Every day | Status: DC | PRN
  Administered 2020-01-27 – 2020-01-28 (×2): 30 mL via ORAL
  Filled 2020-01-26 (×2): qty 30

## 2020-01-26 MED ORDER — CHLORHEXIDINE GLUCONATE CLOTH 2 % EX PADS
6.0000 | MEDICATED_PAD | Freq: Every day | CUTANEOUS | Status: DC
  Administered 2020-01-26 – 2020-01-29 (×4): 6 via TOPICAL

## 2020-01-26 NOTE — Progress Notes (Signed)
Advanced Heart Failure Rounding Note  PCP-Cardiologist: [name], MD     Patient Profile   86 y/o male with h.o CAD, DM2, VT s/p ICD, HTN admitted with NSTEMI and acute systolic HF. Cath by Dr. [name] revealed severe 2v CAD in LD and LCX with borderline ostial LM lesion. Filling pressures markedly elevated with low output. Lactic acid up. IABP placed and milrinone started. Echo w/ EF 40-45%. Seen by TCTS and felt not to be surgical candidate. Plan is for staged PCI.   Subjective:    5/14 was in AF w/ RVR and developed CP. Converted w/ IV amio 5/15 IABP removed 5/16 developed recurrent pulmonary edema, improved w/ IV Lasix 5/17 found to have UTI. Foley removed. Cultures pending. On broad spectrum abx, Vanc + ceftriaxone. mTemp past 24 hrs 101.6. CBC pending.   5/18 back in Afib, in the setting of UTI. Rebolused w/ Amio==>conversion back to NSR 5/20 PCI/DES with CSI of LAD 5/22 started CVVHD  CVVHD started yesterday. Pulled some last night but unable to pull this am due to low BP. CVP 11-12. Weight up (bed weight)  Now anuric.   Denies SOB or CP   On milrinone 0.2. Co-ox 56%  On heparin. No bleeding.   WBC 11.0 -> 14.4  Objective:   Weight Range: 100.2 kg Body mass index is 32.62 kg/m.   Vital Signs:   Temp:  [97.3 F (36.3 C)-98.1 F (36.7 C)] 97.3 F (36.3 C) (05/23 0800) Pulse Rate:  [65-94] 73 (05/23 1016) Resp:  [13-22] 18 (05/23 1016) BP: (78-117)/(44-84) 110/49 (05/23 1016) SpO2:  [89 %-96 %] 94 % (05/23 1016) FiO2 (%):  [50 %-80 %] 50 % (05/23 0350) Weight:  [100.2 kg] 100.2 kg (05/23 0600) Last BM Date: 01/22/2020  Weight change: Filed Weights   01/24/20 0500 01/25/20 0600 01/26/20 0600  Weight: 95.9 kg 98.6 kg 100.2 kg    Intake/Output:   Intake/Output Summary (Last 24 hours) at 01/26/2020 1057 Last data filed at 01/26/2020 1000 Gross per 24 hour  Intake 1379.23 ml  Output 2475 ml  Net -1095.77 ml      Physical Exam   General:   Lying in bed  No resp difficulty HEENT: normal Neck: supple. JVP to jaw RIJ trialysis. Carotids 2+ bilat; no bruits. No lymphadenopathy or thryomegaly appreciated. Cor: PMI nondisplaced. Regular rate & rhythm. No rubs, gallops or murmurs. Lungs: coarse Abdomen: soft, nontender, nondistended. No hepatosplenomegaly. No bruits or masses. Good bowel sounds. Extremities: no cyanosis, clubbing, rash,1-2+  edema Neuro: alert & orientedx3, cranial nerves grossly intact. moves all 4 extremities w/o difficulty. Affect pleasant   Telemetry   NSR 70-80s Personally reviewed   Labs    CBC Recent Labs    01/24/20 0459 01/26/20 0351  WBC 11.0* 14.4*  HGB 9.6* 8.0*  HCT 29.2* 24.5*  MCV 99.0 99.2  PLT 252 0000000   Basic Metabolic Panel Recent Labs    01/25/20 1541 01/26/20 0351  NA 129* 132*  K 5.2* 4.8  CL 94* 96*  CO2 21* 23  GLUCOSE 189* 172*  BUN 46* 32*  CREATININE 3.59* 2.83*  CALCIUM 8.6* 8.4*  MG  --  2.1  PHOS 5.4* 4.3   Liver Function Tests Recent Labs    01/25/20 1541 01/26/20 0351  ALBUMIN 2.5* 2.4*   No results for input(s): LIPASE, AMYLASE in the last 72 hours. Cardiac Enzymes No results for input(s): CKTOTAL, CKMB, CKMBINDEX, TROPONINI in the last 72 hours.  BNP: BNP (last  3 results) Recent Labs    01/07/2020 1403  BNP 597.8*    ProBNP (last 3 results) No results for input(s): PROBNP in the last 8760 hours.   D-Dimer No results for input(s): DDIMER in the last 72 hours. Hemoglobin A1C No results for input(s): HGBA1C in the last 72 hours. Fasting Lipid Panel No results for input(s): CHOL, HDL, LDLCALC, TRIG, CHOLHDL, LDLDIRECT in the last 72 hours. Thyroid Function Tests No results for input(s): TSH, T4TOTAL, T3FREE, THYROIDAB in the last 72 hours.  Invalid input(s): FREET3  Other results:   Imaging    DG CHEST PORT 1 VIEW  Result Date: 01/25/2020 CLINICAL DATA:  Status post dialysis catheter insertion EXAM: PORTABLE CHEST 1 VIEW  COMPARISON:  01/24/2020 FINDINGS: Cardiac shadow is enlarged but stable. Defibrillator is again noted. Aortic calcifications are seen. New temporary dialysis catheter is been placed via the right jugular approach. No pneumothorax is seen. Right-sided PICC line is noted. Mild vascular congestion is seen likely related to volume overload. Patchy opacity seen on the prior exam have improved somewhat in the interval. Small left pleural effusion remains. IMPRESSION: Improved aeration. No pneumothorax following central line placement. Electronically Signed   By: Inez Catalina M.D.   On: 01/25/2020 11:39     Medications:     Scheduled Medications: . amLODipine  5 mg Oral Daily  . aspirin EC  81 mg Oral Daily  . atorvastatin  80 mg Oral q1800  . chlorhexidine  15 mL Mouth Rinse BID  . Chlorhexidine Gluconate Cloth  6 each Topical Daily  . clopidogrel  75 mg Oral Daily  . docusate sodium  100 mg Oral Daily  . gabapentin  300 mg Oral Daily  . gabapentin  600 mg Oral Daily  . insulin aspart  0-15 Units Subcutaneous Q4H  . iron polysaccharides  150 mg Oral Daily  . latanoprost  1 drop Both Eyes QHS  . mouth rinse  15 mL Mouth Rinse q12n4p  . melatonin  3 mg Oral QHS  . sodium chloride flush  10-40 mL Intracatheter Q12H  . spironolactone  12.5 mg Oral Daily  . tamsulosin  0.4 mg Oral Daily  . vitamin B-12  500 mcg Oral Daily  . Vitamin D (Ergocalciferol)  50,000 Units Oral Q Thu    Infusions: .  prismasol BGK 4/2.5 400 mL/hr at 01/26/20 0113  .  prismasol BGK 4/2.5 200 mL/hr at 01/25/20 1146  . sodium chloride Stopped (01/16/20 1752)  . amiodarone 60 mg/hr (01/26/20 1000)  . heparin 950 Units/hr (01/26/20 1000)  . milrinone 0.2 mcg/kg/min (01/26/20 1000)  . prismasol BGK 4/2.5 2,000 mL/hr at 01/26/20 0951    PRN Medications: sodium chloride, acetaminophen, alteplase, heparin, nitroGLYCERIN, ondansetron (ZOFRAN) IV, sodium chloride, sodium chloride flush   Assessment/Plan   1. NSTEMI   -Known coronary disease. In 2019 had DES to RCA.  -LHC 01/16/2020 with multivessel disease including LM disease  - On ASA 81   - On atorvastatin 80 mg daily  - case reviewed extensively with Dr. [name] and Dr.Owen. Agree that best plan is for MV atherectomy/stenting including possible ostial LM. - IABP removal 5/15. No active CP - Underwent CSI with PCI/DES to LAD on [id number] complicated by slow reflow  - On DAPT and heparin - Still needs PCI Lcx but will likely have to wait a few weeks - Off b-blocker with shock  2. Cardiogenic Shock/Acute Systolic Heart Failure in the setting of NSTEMI - ICM. ECHO 2020 EF 55%---> EF  now down 40-45%  - IABP placed in cath lab 5/12. Stable after IABP removal 5/15 - On milrione 0.2 mcg. CO-OX 56%. Will continue - Developed recurrent pulmonary edema post PCI - Volume status up. CRRT initiated 5/22. Unable to pull due to low BP - CVP 11-12 - Add midodrine 5 tid. Can add NE as needed (ordered)  3. Acute Hypoxic Respiratory Failure due to pulmonary edema - As above. Remains volume overloaded. Creatinine up - Respiratory status stable this am   4. AKI  - Creatinine baseline 1-1.2 -> 2.7 -> 3.8 - Likely due to HF/shock/ATN + CIN - Volume status up. CRRT initiated 5/22. Unable to pull due to low BP - CVP 11-12 - Add midodrine 5 tid. Can add NE as needed (ordered) - Does not want long-term HD  5. OSA  - Uses CPAP at home  -  Continue at night.   6. H/O of VT - Has ICD - on PO amiodarone 200 mg daily PTA - now on IV amiodarone for rapid AFib - Keep K> 4.0 Mg > 2.0  8. Afib w/ RVR - back in NSR on IV amio. Will continue - Continue heparin  9. Hypokalemia/hyperkalemia - K 4.8 - managed with CRRT  11. UTI  - 5/17 UA +. Urine culture not sent - Treated with vanc + ceftriaxone x 5 days - foley removed, now w/ voiding difficulty. On flomax  12. Acute blood loss anemia - follow closely. No obvious sites of bleeding.  - check CBC  13.  Leukocytosis - follow closely - may need to repeat cx  14. DNR/DNI - we discussed code status with him and his daughter. Would not want intubation or CPR. (shock ok)   CRITICAL CARE Performed by: Glori Bickers  Total critical care time: 35 minutes  Critical care time was exclusive of separately billable procedures and treating other patients.  Critical care was necessary to treat or prevent imminent or life-threatening deterioration.  Critical care was time spent personally by me (independent of midlevel providers or residents) on the following activities: development of treatment plan with patient and/or surrogate as well as nursing, discussions with consultants, evaluation of patient's response to treatment, examination of patient, obtaining history from patient or surrogate, ordering and performing treatments and interventions, ordering and review of laboratory studies, ordering and review of radiographic studies, pulse oximetry and re-evaluation of patient's condition.      Length of Stay: 79  Glori Bickers, MD  01/26/2020, 10:57 AM  Advanced Heart Failure Team Pager [phone] (M-F; 7a - 4p)  Please contact [facility] for night-coverage after hours (4p -7a ) and weekends on [email or web address]

## 2020-01-26 NOTE — Progress Notes (Signed)
ANTICOAGULATION CONSULT NOTE  Pharmacy Consult for heparin Indication: chest pain/ACS/Afib  No Known Allergies  Patient Measurements: Height: 5\' 9"  (175.3 cm) Weight: 98.6 kg (217 lb 6 oz) IBW/kg (Calculated) : 70.7 Heparin Dosing Weight: 89 kg   Vital Signs: Temp: 97.3 F (36.3 C) (05/22 2300) Temp Source: Oral (05/22 2300) BP: 96/49 (05/23 0011) Pulse Rate: 66 (05/23 0011)  Labs: Recent Labs    01/25/2020 [id number] 01/21/2020 0636 01/26/2020 1500 01/19/2020 1731 01/29/2020 1731 01/29/2020 1855 01/24/20 0459 01/24/20 0459 01/24/20 1528 01/25/20 0358 01/25/20 1541 01/25/20 2348  HGB 9.3*   < >  --  9.5*   < > 9.6* 9.6*  --   --   --   --   --   HCT 28.3*   < >  --  28.0*  --  29.5* 29.2*  --   --   --   --   --   PLT 202  --   --   --   --  213 252  --   --   --   --   --   HEPARINUNFRC 0.57   < >   < >  --   --   --  0.35   < >  --  0.67 0.23* 0.32  CREATININE  --    < >  --   --   --   --  2.12*   < > 2.73* 3.79* 3.59*  --    < > = values in this interval not displayed.    Estimated Creatinine Clearance: 18.1 mL/min (A) (by C-G formula based on SCr of 3.59 mg/dL (H)).  Assessment: 86 y.o. male with Afib for heparin  Goal of Therapy:  Heparin level 0.3-0.4 units/ml Monitor platelets by anticoagulation protocol: Yes   Plan:  Continue Heparin at current rate  Phillis Knack, PharmD, BCPS  01/26/2020 12:32 AM

## 2020-01-26 NOTE — Plan of Care (Signed)
  Problem: Clinical Measurements: Goal: Ability to maintain clinical measurements within normal limits will improve Outcome: Not Progressing Goal: Will remain free from infection Outcome: Progressing Goal: Diagnostic test results will improve Outcome: Progressing Goal: Respiratory complications will improve Outcome: Progressing Goal: Cardiovascular complication will be avoided Outcome: Progressing   Problem: Activity: Goal: Risk for activity intolerance will decrease Outcome: Not Progressing   Problem: Nutrition: Goal: Adequate nutrition will be maintained Outcome: Not Progressing Note: Pt eating 5-10% of meals   Problem: Coping: Goal: Level of anxiety will decrease Outcome: Progressing   Problem: Elimination: Goal: Will not experience complications related to bowel motility Outcome: Progressing Note: Sorbitol ordered and to be given Goal: Will not experience complications related to urinary retention Outcome: Progressing   Problem: Pain Managment: Goal: General experience of comfort will improve Outcome: Progressing   Problem: Safety: Goal: Ability to remain free from injury will improve Outcome: Progressing   Problem: Skin Integrity: Goal: Risk for impaired skin integrity will decrease Outcome: Progressing   Problem: Cardiovascular: Goal: Ability to achieve and maintain adequate cardiovascular perfusion will improve Outcome: Progressing Goal: Vascular access site(s) Level 0-1 will be maintained Outcome: Completed/Met

## 2020-01-26 NOTE — Progress Notes (Addendum)
ANTICOAGULATION CONSULT NOTE  Pharmacy Consult for heparin Indication: chest pain/ACS/Afib  No Known Allergies  Patient Measurements: Height: 5\' 9"  (175.3 cm) Weight: 100.2 kg (220 lb 14.4 oz) IBW/kg (Calculated) : 70.7 Heparin Dosing Weight: 89 kg   Vital Signs: Temp: 98.1 F (36.7 C) (05/23 0345) Temp Source: Axillary (05/23 0345) BP: 106/49 (05/23 0700) Pulse Rate: 65 (05/23 0700)  Labs: Recent Labs    01/12/2020 1500 01/13/2020 1855 01/24/20 0459 01/24/20 1528 01/25/20 0358 01/25/20 0358 01/25/20 1541 01/25/20 2348 01/26/20 0351  HGB   < > 9.6* 9.6*  --   --   --   --   --  8.0*  HCT  --  29.5* 29.2*  --   --   --   --   --  24.5*  PLT  --  213 252  --   --   --   --   --  233  APTT  --   --   --   --   --   --   --   --  68*  HEPARINUNFRC   < >  --  0.35  --  0.67   < > 0.23* 0.32 0.22*  CREATININE  --   --  2.12*   < > 3.79*  --  3.59*  --  2.83*   < > = values in this interval not displayed.    Estimated Creatinine Clearance: 23.1 mL/min (A) (by C-G formula based on SCr of 2.83 mg/dL (H)).   Medical History: Past Medical History:  Diagnosis Date  . Acute respiratory failure ([place])   . BPH (benign prostatic hyperplasia)   . Cardiac arrest ([place])   . Carpal tunnel syndrome   . CKD (chronic kidney disease)   . Diabetes ([place])   . DM II (diabetes mellitus, type II), controlled ([place])   . High risk medication use   . Hyperlipidemia   . Hypertension   . Low vitamin D level   . STEMI (ST elevation myocardial infarction) ([place])    01/22/18 PCI/DES to RCA  . Systolic heart failure ([place])   . VT (ventricular tachycardia) ([facility])     Medications:  Medications Prior to Admission  Medication Sig Dispense Refill Last Dose  . amiodarone (PACERONE) 200 MG tablet Take one tablet by mouth daily Monday through Saturday.  Do NOT take on Sunday. (Patient taking differently: Take 200 mg by mouth See admin instructions. Take 200 mg by mouth in the morning on  Mon/Tues/Wed/Thurs/Fri/Sat and nothing on Sunday) 90 tablet 3 01/29/2020 at am  . amLODipine (NORVASC) 10 MG tablet Take 10 mg by mouth daily.    01/24/2020 at am  . ammonium lactate (LAC-HYDRIN) 12 % lotion Apply 1 application topically 2 (two) times daily as needed (to affected areas of legs and hands).    01/18/2020 at Unknown time  . aspirin EC 81 MG tablet Take 81 mg by mouth daily.   01/29/2020 at 0800  . atorvastatin (LIPITOR) 80 MG tablet TAKE 1 TABLET BY MOUTH ONCE DAILY AT  6  PM (Patient taking differently: Take 80 mg by mouth See admin instructions. Take 80 mg by mouth at 5 PM daily) 90 tablet 1 01/14/2020 at pm  . clopidogrel (PLAVIX) 75 MG tablet Take 1 tablet by mouth once daily (Patient taking differently: Take 75 mg by mouth daily. ) 90 tablet 3 01/19/2020 at 0800  . FERREX 150 150 MG capsule TAKE 1 CAPSULE BY MOUTH ONCE DAILY (Patient taking differently:  Take 150 mg by mouth daily. ) 90 capsule 3 01/29/2020 at am  . furosemide (LASIX) 40 MG tablet Take 1 tablet (40 mg total) by mouth daily. (Patient taking differently: Take 40 mg by mouth See admin instructions. Take 40 mg by mouth in the morning and 40 mg at 1:30 PM) 90 tablet 3 01/31/2020 at am  . gabapentin (NEURONTIN) 300 MG capsule Take 300-600 mg by mouth See admin instructions. Take 300 mg by mouth in the morning and 600 mg at 5 PM  1 01/19/2020 at am  . glimepiride (AMARYL) 2 MG tablet Take 1 mg by mouth daily with breakfast.    01/19/2020 at am  . isosorbide mononitrate (IMDUR) 30 MG 24 hr tablet Take 1 tablet by mouth once daily (Patient taking differently: Take 30 mg by mouth in the morning. ) 90 tablet 0 01/22/2020 at am  . latanoprost (XALATAN) 0.005 % ophthalmic solution Place 1 drop into both eyes at bedtime.   01/14/2020 at pm  . losartan (COZAAR) 100 MG tablet Take 1 tablet (100 mg total) by mouth daily. (Patient taking differently: Take 100 mg by mouth See admin instructions. Take 100 mg by mouth at 5 PM daily) 90 tablet 3  01/14/2020 at pm  . metoprolol succinate (TOPROL-XL) 50 MG 24 hr tablet Take 1 tablet (50 mg total) by mouth daily. Take with or immediately following a meal. 90 tablet 3 01/09/2020 at 0800  . nitroGLYCERIN (NITROSTAT) 0.4 MG SL tablet Place 1 tablet (0.4 mg total) under the tongue every 5 (five) minutes x 3 doses as needed for chest pain. 25 tablet 1 01/19/2020 at Unknown time  . potassium chloride SA (KLOR-CON M20) 20 MEQ tablet Take 1 tablet (20 mEq total) by mouth daily. 30 tablet 3 01/08/2020 at am  . terazosin (HYTRIN) 1 MG capsule Take 1 mg by mouth See admin instructions. Take 1 mg by mouth at 5 PM daily   01/14/2020 at pm  . triamcinolone cream (KENALOG) 0.1 % Apply 1 application topically as needed (to itching areas of skin).    unk at unk  . vitamin B-12 (CYANOCOBALAMIN) 500 MCG tablet Take 500 mcg by mouth daily.   01/29/2020 at am  . Vitamin D, Ergocalciferol, (DRISDOL) 50000 units CAPS capsule Take 50,000 Units by mouth every Thursday.    01/09/2020 at Unknown time    Assessment: 73 YOM presented to the ED with NSTEMI. He has a history of known coronary artery disease, and in 2019 had DES to RCA.   Patient s/p left and right heart cath with IABP insertion on 5/12. He also has new onset AF RVR this admission. IABP was removed 5/15. On 5/21, patient was taken back to the cath lab for PCI and atherectomy. Heparin was resumed temporarily held and resumed from this procedure at midnight on 5/21.  Heparin level this morning is low at 0.22 on heparin drip 950 units/hr running through PICC line. Will need peripheral stick from phlebotomy for accurate levels. Hgb 8, pltc 233. No bleeding, complications, or infusion issues noted at this time.   Goal of Therapy:  Heparin level 0.3-0.4 units/ml Monitor platelets by anticoagulation protocol: Yes   Plan:  -With labile heparin levels and drop in hgb, will recheck heparin level in a few hours and continue current rate -Check heparin level at noon  today -Daily heparin level and CBC -Monitor for s/sx of bleeding   Addendum: - repeat heparin level is therapeutic at 0.32 - continue heparin at current  rate and check next level with AM labs   Vertis Kelch, PharmD, [facility] PGY2 Cardiology Pharmacy Resident Phone [phone] 01/26/2020       7:20 AM  Please check [email or web address] for unit-specific pharmacist phone numbers

## 2020-01-26 NOTE — Progress Notes (Signed)
[facility] Progress Note  Subjective: no new c/o', on CRRT now, I/O even yest, neg 600 cc today so far  Vitals:   01/26/20 0800 01/26/20 0900 01/26/20 1000 01/26/20 1016  BP: (!) 106/49 96/69 (!) 78/59 (!) 110/49  Pulse: 74 72 73 73  Resp: 19 17 15 18   Temp: (!) 97.3 F (36.3 C)     TempSrc: Oral     SpO2: 95% 95% 92% 94%  Weight:      Height:        Exam: Gen alert, nasal O2, on CRRT R IJ temp cath Chest bibasilar soft rales RRR no MRG Abd soft ntnd no mass or ascites +bs GU normal male MS no joint effusions or deformity Ext diffuse 2-3+ edema of UE's, hips > pretib  Neuro is alert, Ox 3 , nf    Home meds: - amio 200/ norvasc 10/ asa 81/ lipitor 80/ plavix 75/ lasix 40 bid/ imdur 30/ sl ntg prn/ kdur 20 qd/ metoprolol xl 50 qd/ losartan 100 qd - amaryl 1mg  am - gabapentin 300 qd  - prn's/ vitamins/ supplements     CXR 5/21 - ^'d pulm edema / effusions   BP's 108/55 on IV milrinone/ amio, no pressors    UA 5/17 - many bact, > 50 rbc/ wbc    Rocephin d# 6    IV vanc sp 4d , dc'd on 5/21     Baseline creat = 1.2- 1.6 , 2019- 2021, CKD III  Assessment/ Plan: 1. AoCKD3 - related to contrast, CKD3, syst CHF.  Declining UOP and worsening pulm / peripheral edema on IV milrinone in ICU. Started CRRT 5/22, starting to get neg pull today, goal 100cc/hr, increase possibly if tolerating well. Pt agrees to short-term RRT, not sure he would do it long-term though. Will follow.  2. CAD/ NSTEMI - sp PCI to LAD. Still needs work on LCX.  3. Syst CHF - LV 40-45%, ^^LVEDP by cath , vol overload w/ pulm / peripheral edema 4. Atrial fibrillation - on IV hep 5. DM2 - on oral agents at home 6. VTach sp ICD 7. HTN - bp's soft now    [employer] 01/26/2020, 11:20 AM   Recent Labs  Lab 01/24/20 0459 01/24/20 1528 01/25/20 1541 01/26/20 0351  K 5.2*   < > 5.2* 4.8  BUN 35*   < > 46* 32*  CREATININE 2.12*   < > 3.59* 2.83*  CALCIUM 8.8*   < > 8.6* 8.4*   PHOS  --   --  5.4* 4.3  HGB 9.6*  --   --  8.0*   < > = values in this interval not displayed.   Inpatient medications: . amLODipine  5 mg Oral Daily  . aspirin EC  81 mg Oral Daily  . atorvastatin  80 mg Oral q1800  . chlorhexidine  15 mL Mouth Rinse BID  . Chlorhexidine Gluconate Cloth  6 each Topical Daily  . clopidogrel  75 mg Oral Daily  . docusate sodium  100 mg Oral Daily  . gabapentin  300 mg Oral Daily  . gabapentin  600 mg Oral Daily  . insulin aspart  0-15 Units Subcutaneous Q4H  . iron polysaccharides  150 mg Oral Daily  . latanoprost  1 drop Both Eyes QHS  . mouth rinse  15 mL Mouth Rinse q12n4p  . melatonin  3 mg Oral QHS  . midodrine  5 mg Oral TID WC  . sodium chloride flush  10-40 mL Intracatheter Q12H  . spironolactone  12.5 mg Oral Daily  . tamsulosin  0.4 mg Oral Daily  . vitamin B-12  500 mcg Oral Daily  . Vitamin D (Ergocalciferol)  50,000 Units Oral Q Thu   .  prismasol BGK 4/2.5 400 mL/hr at 01/26/20 0113  .  prismasol BGK 4/2.5 200 mL/hr at 01/25/20 1146  . sodium chloride Stopped (01/16/20 1752)  . amiodarone 60 mg/hr (01/26/20 1100)  . heparin 950 Units/hr (01/26/20 1100)  . milrinone 0.2 mcg/kg/min (01/26/20 1100)  . norepinephrine (LEVOPHED) Adult infusion    . prismasol BGK 4/2.5 2,000 mL/hr at 01/26/20 0951   sodium chloride, acetaminophen, alteplase, heparin, nitroGLYCERIN, ondansetron (ZOFRAN) IV, sodium chloride, sodium chloride flush

## 2020-01-26 NOTE — Progress Notes (Signed)
RT placed pt on BiPAP/PCV through servo I for the night. Pt is unable to use home CPAP d/t increased oxygen requirements. Pt tolerating BIPAP/PCV well, respiratory status is stable at this time, pt in no distress. RT will continue to monitor.

## 2020-01-27 LAB — RENAL FUNCTION PANEL
Albumin: 2.5 g/dL — ABNORMAL LOW (ref 3.5–5.0)
Albumin: 2.5 g/dL — ABNORMAL LOW (ref 3.5–5.0)
Anion gap: 13 (ref 5–15)
Anion gap: 10 (ref 5–15)
BUN: 17 mg/dL (ref 8–23)
BUN: 17 mg/dL (ref 8–23)
CO2: 24 mmol/L (ref 22–32)
CO2: 24 mmol/L (ref 22–32)
Calcium: 8.6 mg/dL — ABNORMAL LOW (ref 8.9–10.3)
Calcium: 8.4 mg/dL — ABNORMAL LOW (ref 8.9–10.3)
Chloride: 97 mmol/L — ABNORMAL LOW (ref 98–111)
Chloride: 96 mmol/L — ABNORMAL LOW (ref 98–111)
Creatinine, Ser: 1.88 mg/dL — ABNORMAL HIGH (ref 0.61–1.24)
Creatinine, Ser: 1.66 mg/dL — ABNORMAL HIGH (ref 0.61–1.24)
GFR calc Af Amer: 37 mL/min — ABNORMAL LOW (ref >60)
GFR calc Af Amer: 44 mL/min — ABNORMAL LOW (ref >60)
GFR calc non Af Amer: 32 mL/min — ABNORMAL LOW (ref >60)
GFR calc non Af Amer: 38 mL/min — ABNORMAL LOW (ref >60)
Glucose, Bld: 177 mg/dL — ABNORMAL HIGH (ref 70–99)
Glucose, Bld: 255 mg/dL — ABNORMAL HIGH (ref 70–99)
Phosphorus: 2.8 mg/dL (ref 2.5–4.6)
Phosphorus: 1.9 mg/dL — ABNORMAL LOW (ref 2.5–4.6)
Potassium: 4.8 mmol/L (ref 3.5–5.1)
Potassium: 4.8 mmol/L (ref 3.5–5.1)
Sodium: 134 mmol/L — ABNORMAL LOW (ref 135–145)
Sodium: 130 mmol/L — ABNORMAL LOW (ref 135–145)

## 2020-01-27 LAB — CBC
HCT: 25.1 % — ABNORMAL LOW (ref 39.0–52.0)
Hemoglobin: 8.2 g/dL — ABNORMAL LOW (ref 13.0–17.0)
MCH: 32.9 pg (ref 26.0–34.0)
MCHC: 32.7 g/dL (ref 30.0–36.0)
MCV: 100.8 fL — ABNORMAL HIGH (ref 80.0–100.0)
Platelets: 313 10*3/uL (ref 150–400)
RBC: 2.49 MIL/uL — ABNORMAL LOW (ref 4.22–5.81)
RDW: 15.7 % — ABNORMAL HIGH (ref 11.5–15.5)
WBC: 19.2 10*3/uL — ABNORMAL HIGH (ref 4.0–10.5)
nRBC: 0.3 % — ABNORMAL HIGH (ref 0.0–0.2)

## 2020-01-27 LAB — COOXEMETRY PANEL
Carboxyhemoglobin: 1 % (ref 0.5–1.5)
Methemoglobin: 1 % (ref 0.0–1.5)
O2 Saturation: 51 %
Total hemoglobin: 13 g/dL (ref 12.0–16.0)

## 2020-01-27 LAB — MAGNESIUM: Magnesium: 2.4 mg/dL (ref 1.7–2.4)

## 2020-01-27 LAB — GLUCOSE, CAPILLARY
Glucose-Capillary: 155 mg/dL — ABNORMAL HIGH (ref 70–99)
Glucose-Capillary: 164 mg/dL — ABNORMAL HIGH (ref 70–99)
Glucose-Capillary: 165 mg/dL — ABNORMAL HIGH (ref 70–99)
Glucose-Capillary: 191 mg/dL — ABNORMAL HIGH (ref 70–99)
Glucose-Capillary: 199 mg/dL — ABNORMAL HIGH (ref 70–99)
Glucose-Capillary: 234 mg/dL — ABNORMAL HIGH (ref 70–99)
Glucose-Capillary: 254 mg/dL — ABNORMAL HIGH (ref 70–99)

## 2020-01-27 LAB — APTT: aPTT: 66 seconds — ABNORMAL HIGH (ref 24–36)

## 2020-01-27 LAB — HEPARIN LEVEL (UNFRACTIONATED): Heparin Unfractionated: 0.47 IU/mL (ref 0.30–0.70)

## 2020-01-27 MED ORDER — B COMPLEX-C PO TABS
1.0000 | ORAL_TABLET | Freq: Every day | ORAL | Status: DC
  Administered 2020-01-27 – 2020-01-29 (×3): 1 via ORAL
  Filled 2020-01-27 (×3): qty 1

## 2020-01-27 MED ORDER — MIDODRINE HCL 5 MG PO TABS
10.0000 mg | ORAL_TABLET | Freq: Three times a day (TID) | ORAL | Status: DC
  Administered 2020-01-27 – 2020-01-29 (×6): 10 mg via ORAL
  Filled 2020-01-27 (×6): qty 2

## 2020-01-27 MED ORDER — INSULIN ASPART 100 UNIT/ML ~~LOC~~ SOLN
0.0000 [IU] | Freq: Three times a day (TID) | SUBCUTANEOUS | Status: DC
  Administered 2020-01-27 (×2): 3 [IU] via SUBCUTANEOUS
  Administered 2020-01-27: 8 [IU] via SUBCUTANEOUS
  Administered 2020-01-28: 3 [IU] via SUBCUTANEOUS
  Administered 2020-01-28: 8 [IU] via SUBCUTANEOUS
  Administered 2020-01-28 (×2): 3 [IU] via SUBCUTANEOUS
  Administered 2020-01-29: 5 [IU] via SUBCUTANEOUS

## 2020-01-27 MED ORDER — ENSURE ENLIVE PO LIQD
237.0000 mL | Freq: Three times a day (TID) | ORAL | Status: DC
  Administered 2020-01-27 – 2020-01-29 (×6): 237 mL via ORAL

## 2020-01-27 NOTE — Progress Notes (Signed)
[facility] Progress Note  Subjective: no new c/o', cont on CRRT, I/O net neg 2.4L yesterday (UOP 170mL, 4.1L CRRT).  Granddaughter updated at bedside.  Per RN and family ^ confusion.   Vitals:   01/27/20 0630 01/27/20 0700 01/27/20 0730 01/27/20 0748  BP: (!) 94/59 (!) 110/51 (!) 103/50   Pulse: 95 93 90   Resp: 18 19 19    Temp:      TempSrc:      SpO2: 90% 93% (!) 89% 90%  Weight:      Height:        Exam: Gen alert, nasal O2, on CRRT R IJ temp cath Chest bibasilar soft rales RRR no MRG Abd soft ntnd no mass or ascites +bs GU normal male MS no joint effusions or deformity Ext diffuse 2-3+ edema of UE's, hips > pretib   Neuro is alert, Ox 3 , nf    Home meds: - amio 200/ norvasc 10/ asa 81/ lipitor 80/ plavix 75/ lasix 40 bid/ imdur 30/ sl ntg prn/ kdur 20 qd/ metoprolol xl 50 qd/ losartan 100 qd - amaryl 1mg  am - gabapentin 300 qd  - prn's/ vitamins/ supplements     CXR 5/21 - ^'d pulm edema / effusions   BP's 108/55 on IV milrinone/ amio, no pressors    UA 5/17 - many bact, > 50 rbc/ wbc    Rocephin d# 6    IV vanc sp 4d , dc'd on 5/21     Baseline creat = 1.2- 1.6 , 2019- 2021, CKD III  Assessment/ Plan: 1. AoCKD3 - related to contrast, CKD3, syst CHF.  Declining UOP and worsening pulm / peripheral edema on IV milrinone in ICU prompting CRRT 5/22. Pt agrees to short-term RRT, not sure he would do it long-term though (likely not). Will follow - perhaps encouraging to see a small amount of UOP yesterday.  Granddaughter has good understanding and expectations --> family will d/w him GOC; for now continue CRRT.  I see in cardiology note plan is to switch to comfort care Wednesday if no improvement at his wishes.  2. CAD/ NSTEMI - sp PCI to LAD. Still needs work on LCX.  3. Syst CHF - LV 40-45%, ^^LVEDP by cath , vol overload w/ pulm / peripheral edema 4. Atrial fibrillation - on IV hep 5. DM2 - on oral agents at home 6. VTach sp ICD 7. HTN - bp's  soft now   Jannifer Hick MD [facility] Pager [phone]   Recent Labs  Lab 01/26/20 [phone] 01/26/20 0351 01/26/20 1615 01/27/20 0404  K 4.8   < > 5.0 4.8  BUN 32*   < > 23 17  CREATININE 2.83*   < > 2.23* 1.88*  CALCIUM 8.4*   < > 8.6* 8.6*  PHOS 4.3   < > 3.2 2.8  HGB 8.0*  --   --  8.2*   < > = values in this interval not displayed.   Inpatient medications: . amLODipine  5 mg Oral Daily  . aspirin EC  81 mg Oral Daily  . atorvastatin  80 mg Oral q1800  . chlorhexidine  15 mL Mouth Rinse BID  . Chlorhexidine Gluconate Cloth  6 each Topical Daily  . clopidogrel  75 mg Oral Daily  . docusate sodium  100 mg Oral Daily  . gabapentin  300 mg Oral Daily  . gabapentin  600 mg Oral Daily  . insulin aspart  0-15 Units Subcutaneous Q4H  .  iron polysaccharides  150 mg Oral Daily  . latanoprost  1 drop Both Eyes QHS  . mouth rinse  15 mL Mouth Rinse q12n4p  . melatonin  3 mg Oral QHS  . midodrine  5 mg Oral TID WC  . sodium chloride flush  10-40 mL Intracatheter Q12H  . spironolactone  12.5 mg Oral Daily  . tamsulosin  0.4 mg Oral Daily  . vitamin B-12  500 mcg Oral Daily  . Vitamin D (Ergocalciferol)  50,000 Units Oral Q Thu   .  prismasol BGK 4/2.5 400 mL/hr at 01/27/20 0342  .  prismasol BGK 4/2.5 200 mL/hr at 01/26/20 1439  . sodium chloride Stopped (01/16/20 1752)  . amiodarone 60 mg/hr (01/27/20 0743)  . heparin 950 Units/hr (01/27/20 0700)  . milrinone 0.2 mcg/kg/min (01/27/20 0700)  . norepinephrine (LEVOPHED) Adult infusion 10 mcg/min (01/27/20 0700)  . prismasol BGK 4/2.5 2,000 mL/hr at 01/27/20 0631   sodium chloride, acetaminophen, alteplase, heparin, nitroGLYCERIN, ondansetron (ZOFRAN) IV, sodium chloride, sodium chloride flush, sorbitol

## 2020-01-27 NOTE — Progress Notes (Signed)
Assisted tele visit to patient with family member.  Dani Wallner M, RN  

## 2020-01-27 NOTE — Progress Notes (Signed)
Initial Nutrition Assessment  DOCUMENTATION CODES:   Not applicable  INTERVENTION:   Liberalize diet to regular    Ensure Enlive po TID (strawberry), each supplement provides 350 kcal and 20 grams of protein  Magic cup BID with meals (vanilla), each supplement provides 290 kcal and 9 grams of protein  B complex with Vitamin C  NUTRITION DIAGNOSIS:   Inadequate oral intake related to nausea, vomiting as evidenced by per patient/family report.  GOAL:   Patient will meet greater than or equal to 90% of their needs  MONITOR:   PO intake, Supplement acceptance, Weight trends, Labs, I & O's  REASON FOR ASSESSMENT:   Consult Assessment of nutrition requirement/status  ASSESSMENT:   Patient with PMH significant for CAD with STEMI (2019) s/p DES to RCA, CHF/ischemic cardiomyopathy, s/p ICD, HTN, HLD, DM, CKD, and BPH. Presents this admission with NSTEMI and acute exacerbation CHF.   5/12- s/p LHC, IABP placed 5/15- IABP removed  5/20- s/p PCI/DES with CSI of LAD 5/22- start CRRT  Pt discussed during ICU rounds and with RN.   Spoke with granddaughter and pt at bedside. Denies loss in appetite PTA. States he typically eats three meals with great protein sources. Appetite declined significantly after heart cath due to ongoing nausea and vomiting. Pt describes vomiting as "little amounts, after eating." Feels some relief after Zofran. Meal completions charted as 5-50% for his last eight meals. Had maybe 10 bites of breakfast this am and one bite of his sandwich for lunch. Discussed the importance of protein intake for preservation of lean body mass while on CRRT. Willing to try supplementation (does not like chocolate or orange sherbet). Pt would likely benefit from Cortrak with enteral nutrition as intake has been poor >7days. [place] discussions are ongoing.   Pt endorses a UBW of 195 lb and denies recent wt loss. States he weighs and records his weight daily. RD observed home logs,  weight shows to stay consistent. Records show small fluctuation in weight over the last year, likely related to hx of CHF.   I/O: -7,512 ml since admit UOP: 175 ml x 24 hrs   Drips: milrinone, levophed Medications: colace, SS novolog, aldactone, Vit B12, Vit D Labs: Na 134 (L) Cr 1.88- trending down CBG 135-241  NUTRITION - FOCUSED PHYSICAL EXAM:    Most Recent Value  Orbital Region  No depletion  Upper Arm Region  No depletion  Thoracic and Lumbar Region  Unable to assess  Buccal Region  No depletion  Temple Region  No depletion  Clavicle Bone Region  No depletion  Clavicle and Acromion Bone Region  No depletion  Scapular Bone Region  Unable to assess  Dorsal Hand  No depletion  Patellar Region  No depletion  Anterior Thigh Region  No depletion  Posterior Calf Region  No depletion  Edema (RD Assessment)  Mild  Hair  Reviewed  Eyes  Reviewed  Mouth  Reviewed  Skin  Reviewed  Nails  Reviewed     Diet Order:   Diet Order            Diet Carb Modified Fluid consistency: Thin; Room service appropriate? Yes  Diet effective now              EDUCATION NEEDS:   Education needs have been addressed  Skin:  Skin Assessment: Reviewed RN Assessment  Last BM:  5/20  Height:   Ht Readings from Last 1 Encounters:  01/07/2020 5\' 9"  (1.753 m)  Weight:   Wt Readings from Last 1 Encounters:  01/27/20 98.5 kg    BMI:  Body mass index is 32.07 kg/m.  Estimated Nutritional Needs:   Kcal:  2300-2500  Protein:  160-180 g  Fluid:  >/= 2.3 L/day   Mariana Single RD, LDN Clinical Nutrition Pager listed in [place]

## 2020-01-27 NOTE — Progress Notes (Signed)
Physical Therapy Treatment Patient Details Name: Daryl Rodriguez MRN: [id number] DOB: [birth year] Today's Date: 01/27/2020    History of Present Illness Patient is a 86 y/o male admitted with chest pain and SOB, NSTEMI, acute respiratory failure and acute pulmonary edema from CHF. PCI 01/12/2020. CRRT initiated 5/22. 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 Pt with BP stable throughout session on CRRT with pressor support. Pt transitioned to full chair foot egress positioning of bed and tolerated well for 13 min. Unable to transfer to standing or OOB today per conversation with RN and NP. Pt educated for HEp and encouraged elevated HOB as pt able to tolerate. Will continue to follow as pt medical stability allows.  Supine BP 104/49 (66) 99/49 (63) HOB 40 degrees 111/50 (69) full chair position 94% on 15L HFNC   Follow Up Recommendations  Home health PT;Supervision for mobility/OOB     Equipment Recommendations  Other (comment)(TBD)    Recommendations for Other Services       Precautions / Restrictions Precautions Precautions: Fall Precaution Comments: CRRT, HFNC    Mobility  Bed Mobility               General bed mobility comments: transitioned from supine to sit via foot egress positioning of bed per request of RN and NP. Slide to [facility] with mod +2 assist with pt assisting pushing with bil LE  Transfers                 General transfer comment: NP deferred any standing attempts this session due to pt progressive decline with cardiorenal function  Ambulation/Gait             General Gait Details: unable   Stairs             Wheelchair Mobility    Modified Rankin (Stroke Patients Only)       Balance     Sitting balance-Leahy Scale: Good Sitting balance -  Comments: pt able to sit with trunk unsupported in foot egress chair position                                    Cognition Arousal/Alertness: Awake/alert Behavior During Therapy: WFL for tasks assessed/performed Overall Cognitive Status: Within Functional Limits for tasks assessed                                 General Comments: pt reports he had a terrible night with strange dreams, recalling relationship with wife and conversational WFL noted pt has been confused per RN and family      Exercises General Exercises - Upper Extremity Shoulder Flexion: AAROM;Right;Seated;15 reps Elbow Flexion: AROM;Both;Seated;15 reps Elbow Extension: AROM;Both;Seated;15 reps General Exercises - Lower Extremity Short Arc Quad: AROM;Both;Seated;15 reps Long Arc Quad: AROM;Both;15 reps;Seated Hip ABduction/ADduction: AROM;Both;Seated;15 reps Hip Flexion/Marching: AROM;Both;Seated;15 reps    General Comments        Pertinent Vitals/Pain Pain Assessment: No/denies pain    Home Living                      Prior Function            PT  Goals (current goals can now be found in the care plan section) Progress towards PT goals: Not progressing toward goals - comment(due to medical complications)    Frequency    Min 3X/week      PT Plan Current plan remains appropriate(pending medical stability)    Co-evaluation              AM-PAC PT "6 Clicks" Mobility   Outcome Measure  Help needed turning from your back to your side while in a flat bed without using bedrails?: A Little Help needed moving from lying on your back to sitting on the side of a flat bed without using bedrails?: A Lot Help needed moving to and from a bed to a chair (including a wheelchair)?: A Lot Help needed standing up from a chair using your arms (e.g., wheelchair or bedside chair)?: A Lot Help needed to walk in hospital room?: A Lot Help needed climbing 3-5 steps with a  railing? : A Lot 6 Click Score: 13    End of Session   Activity Tolerance: Patient tolerated treatment well Patient left: in bed;with call bell/phone within reach;with family/visitor present Nurse Communication: Mobility status PT Visit Diagnosis: Muscle weakness (generalized) (M62.81);Unsteadiness on feet (R26.81);Difficulty in walking, not elsewhere classified (R26.2)     Time: 0922-0949 PT Time Calculation (min) (ACUTE ONLY): 27 min  Charges:  $Therapeutic Exercise: 8-22 mins $Therapeutic Activity: 8-22 mins                     Daryl Rodriguez P, PT Acute Rehabilitation Services Pager: [phone] Office: [phone]    Daryl Rodriguez 01/27/2020, 1:06 PM

## 2020-01-27 NOTE — Progress Notes (Addendum)
Advanced Heart Failure Rounding Note  PCP-Cardiologist: [name], MD     Patient Profile   86 y/o male with h.o CAD, DM2, VT s/p ICD, HTN admitted with NSTEMI and acute systolic HF. Cath by Dr. [name] revealed severe 2v CAD in LD and LCX with borderline ostial LM lesion. Filling pressures markedly elevated with low output. Lactic acid up. IABP placed and milrinone started. Echo w/ EF 40-45%. Seen by TCTS and felt not to be surgical candidate. Plan is for staged PCI.   Subjective:    5/14 was in AF w/ RVR and developed CP. Converted w/ IV amio 5/15 IABP removed 5/16 developed recurrent pulmonary edema, improved w/ IV Lasix 5/17 found to have UTI. Foley removed. Cultures pending. On broad spectrum abx, Vanc + ceftriaxone. mTemp past 24 hrs 101.6. CBC pending.   5/18 back in Afib, in the setting of UTI. Rebolused w/ Amio==>conversion back to NSR 5/20 PCI/DES with CSI of LAD 5/22 started CVVHD  Remains on CVVHD.   On norepi 10 mcg + milrinone 0.25 mcg + amio 60 mg. Midodrine was increased to 10 mg tid.   WBC increasing 14>19.    Confused overnight. Had trouble sleeping over night.   Objective:   Weight Range: 98.5 kg Body mass index is 32.07 kg/m.   Vital Signs:   Temp:  [97.3 F (36.3 C)-98.8 F (37.1 C)] (P) 98.1 F (36.7 C) (05/24 0748) Pulse Rate:  [71-177] 92 (05/24 0900) Resp:  [15-25] 16 (05/24 0900) BP: (74-112)/(36-81) 104/49 (05/24 0900) SpO2:  [88 %-100 %] 90 % (05/24 0900) FiO2 (%):  [50 %] 50 % (05/24 0029) Weight:  [98.5 kg] 98.5 kg (05/24 0425) Last BM Date: 01/28/2020  Weight change: Filed Weights   01/25/20 0600 01/26/20 0600 01/27/20 0425  Weight: 98.6 kg 100.2 kg 98.5 kg    Intake/Output:   Intake/Output Summary (Last 24 hours) at 01/27/2020 0904 Last data filed at 01/27/2020 0900 Gross per 24 hour  Intake 1810.77 ml  Output 4266 ml  Net -2455.23 ml      Physical Exam  CVP 14 General:  In bed.  No resp difficulty HEENT:  normal Neck: supple. JVP to jaw.  Carotids 2+ bilat; no bruits. No lymphadenopathy or thryomegaly appreciated.RIJ  Cor: PMI nondisplaced. Irregular rate & rhythm. No rubs, gallops or murmurs. Lungs: clear on HFNC 15 liters  Abdomen: soft, nontender, nondistended. No hepatosplenomegaly. No bruits or masses. Good bowel sounds. Extremities: no cyanosis, clubbing, rash, edema Neuro: alert & orientedx3, cranial nerves grossly intact. moves all 4 extremities w/o difficulty. Affect pleasant  Telemetry  In and out of A fib 90s    Labs    CBC Recent Labs    01/26/20 0351 01/27/20 0404  WBC 14.4* 19.2*  HGB 8.0* 8.2*  HCT 24.5* 25.1*  MCV 99.2 100.8*  PLT 233 [id number]   Basic Metabolic Panel Recent Labs    01/26/20 0351 01/26/20 0351 01/26/20 1615 01/27/20 0404  NA 132*   < > 133* 134*  K 4.8   < > 5.0 4.8  CL 96*   < > 98 97*  CO2 23   < > 22 24  GLUCOSE 172*   < > 186* 177*  BUN 32*   < > 23 17  CREATININE 2.83*   < > 2.23* 1.88*  CALCIUM 8.4*   < > 8.6* 8.6*  MG 2.1  --   --  2.4  PHOS 4.3   < > 3.2 2.8   < > =  values in this interval not displayed.   Liver Function Tests Recent Labs    01/26/20 1615 01/27/20 0404  ALBUMIN 2.6* 2.5*   No results for input(s): LIPASE, AMYLASE in the last 72 hours. Cardiac Enzymes No results for input(s): CKTOTAL, CKMB, CKMBINDEX, TROPONINI in the last 72 hours.  BNP: BNP (last 3 results) Recent Labs    01/12/2020 1403  BNP 597.8*    ProBNP (last 3 results) No results for input(s): PROBNP in the last 8760 hours.   D-Dimer No results for input(s): DDIMER in the last 72 hours. Hemoglobin A1C No results for input(s): HGBA1C in the last 72 hours. Fasting Lipid Panel No results for input(s): CHOL, HDL, LDLCALC, TRIG, CHOLHDL, LDLDIRECT in the last 72 hours. Thyroid Function Tests No results for input(s): TSH, T4TOTAL, T3FREE, THYROIDAB in the last 72 hours.  Invalid input(s): FREET3  Other results:   Imaging    No  results found.   Medications:     Scheduled Medications: . amLODipine  5 mg Oral Daily  . aspirin EC  81 mg Oral Daily  . atorvastatin  80 mg Oral q1800  . chlorhexidine  15 mL Mouth Rinse BID  . Chlorhexidine Gluconate Cloth  6 each Topical Daily  . clopidogrel  75 mg Oral Daily  . docusate sodium  100 mg Oral Daily  . gabapentin  300 mg Oral Daily  . gabapentin  600 mg Oral Daily  . insulin aspart  0-15 Units Subcutaneous Q4H  . iron polysaccharides  150 mg Oral Daily  . latanoprost  1 drop Both Eyes QHS  . mouth rinse  15 mL Mouth Rinse q12n4p  . melatonin  3 mg Oral QHS  . midodrine  10 mg Oral TID WC  . sodium chloride flush  10-40 mL Intracatheter Q12H  . spironolactone  12.5 mg Oral Daily  . tamsulosin  0.4 mg Oral Daily  . vitamin B-12  500 mcg Oral Daily  . Vitamin D (Ergocalciferol)  50,000 Units Oral Q Thu    Infusions: .  prismasol BGK 4/2.5 400 mL/hr at 01/27/20 0342  .  prismasol BGK 4/2.5 200 mL/hr at 01/26/20 1439  . sodium chloride Stopped (01/16/20 1752)  . amiodarone 60 mg/hr (01/27/20 0900)  . heparin 950 Units/hr (01/27/20 0900)  . milrinone 0.2 mcg/kg/min (01/27/20 0900)  . norepinephrine (LEVOPHED) Adult infusion 10 mcg/min (01/27/20 0900)  . prismasol BGK 4/2.5 2,000 mL/hr at 01/27/20 0631    PRN Medications: sodium chloride, acetaminophen, alteplase, heparin, nitroGLYCERIN, ondansetron (ZOFRAN) IV, sodium chloride, sodium chloride flush, sorbitol   Assessment/Plan   1. NSTEMI  -Known coronary disease. In 2019 had DES to RCA.  -LHC 01/10/2020 with multivessel disease including LM disease  - On ASA 81   - On atorvastatin 80 mg daily  - case reviewed extensively with Dr. [name] and Dr.Owen. Agree that best plan is for MV atherectomy/stenting including possible ostial LM. - IABP removal 5/15. No active CP - Underwent CSI with PCI/DES to LAD on [id number] complicated by slow reflow  - On DAPT and heparin - Still needs PCI Lcx but will likely have to  wait a few weeks - Off b-blocker with shock - No chest pain.   2. Cardiogenic Shock/Acute Systolic Heart Failure in the setting of NSTEMI - ICM. ECHO 2020 EF 55%---> EF now down 40-45%  - IABP placed in cath lab 5/12. Stable after IABP removal 5/15 - On milrione 0.2 mcg. CO-OX 51%.  - Developed recurrent pulmonary edema post  PCI - CRRT initiated 5/22. Unable to pull due to low BP -Midodrine increaesd to 10 mg tid+ norepi 10 mcg  3. Acute Hypoxic Respiratory Failure due to pulmonary edema -Has been on and off BiPap.  - Continue HFNC.   4. AKI  - Creatinine baseline 1-1.2 -> 2.7 -> 3.8->2.23>1.88 - Likely due to HF/shock/ATN + CIN - Volume status up. CRRT initiated 5/22. Unable to pull due to low BP - Continue norepi + midodrine.  - Does not want long-term HD  5. OSA  - Uses CPAP at home  -  Continue at night.   6. H/O of VT - Has ICD - on PO amiodarone 200 mg daily PTA - Continue IV amiodarone for rapid AFib - Rate controlled. Cut back amio to 30 mg per hour.  - Keep K> 4.0 Mg > 2.0  8. Afib w/ RVR -In and out of A fib.  Cut back amio to 30 mg per hour.  - Continue heparin  9. Hypokalemia/hyperkalemia - K 4.8 - managed with CRRT  11. UTI  - 5/17 UA +. Urine culture not sent - Treated with vanc + ceftriaxone x 5 days - foley removed, had difficulty voiding so flomax was started.   12. Acute blood loss anemia - Hgb 8.2   13. Leukocytosis - WBC trending up 14>19-  - Repeat blood cx.   14. DNR/DNI - we discussed code status with him and his daughter. Would not want intubation or CPR. (shock ok) He is very clear that if has not improved by Wednesday he wants to switch to comfort care. He is very concerning about "drowning and pain" . I assured him that we will make every wish to honor his wishes.    Length of Stay: [place], NP  01/27/2020, 9:04 AM  Advanced Heart Failure Team Pager [phone] (M-F; 7a - 4p)  Please contact [facility] for  night-coverage after hours (4p -7a ) and weekends on [email or web address]  Agree.  Confused overnight. Remains on CVVHD. Weight down 3 pounds. CVP 14 Co-ox 51%. Now on NE 10 and milrinone. Also midodrine 10 tid. WBC up to 19k.   General:  Elderly. Weak appearing. No resp difficulty HEENT: normal Neck: supple. RIJ trialysis cath Carotids 2+ bilat; no bruits. No lymphadenopathy or thryomegaly appreciated. Cor: PMI nondisplaced. Regular rate & rhythm. No rubs, gallops or murmurs. Lungs: coarse Abdomen: soft, nontender, nondistended. No hepatosplenomegaly. No bruits or masses. Good bowel sounds. Extremities: no cyanosis, clubbing, rash, edema Neuro: alert & orientedx3, cranial nerves grossly intact. moves all 4 extremities w/o difficulty. Affect pleasant  He remains very tenuous. On CRRT and pressors. Co-ox low CVP up. Some confusion overnight. Will panculture. Low threshold to start abx. He does not want long-term aggressiv erx but willing to stay the course for next few days.   CRITICAL CARE Performed by: Glori Bickers  Total critical care time: 35 minutes  Critical care time was exclusive of separately billable procedures and treating other patients.  Critical care was necessary to treat or prevent imminent or life-threatening deterioration.  Critical care was time spent personally by me (independent of midlevel providers or residents) on the following activities: development of treatment plan with patient and/or surrogate as well as nursing, discussions with consultants, evaluation of patient's response to treatment, examination of patient, obtaining history from patient or surrogate, ordering and performing treatments and interventions, ordering and review of laboratory studies, ordering and review of radiographic studies, pulse oximetry and re-evaluation of patient's  condition.  Glori Bickers, MD  10:04 AM

## 2020-01-27 NOTE — Progress Notes (Signed)
RT placed pt on BIPAP/PCV for the night through the servo I. Pt is currently unable to wear his night time CPAP d/t increased oxygen requirements. Pt tolerating BIPAP/PCV, respiratory status is stable at this time w/no distress noted. RT will continue to monitor.

## 2020-01-27 NOTE — Progress Notes (Signed)
ANTICOAGULATION CONSULT NOTE  Pharmacy Consult for heparin Indication: chest pain/ACS/Afib  No Known Allergies  Patient Measurements: Height: 5\' 9"  (175.3 cm) Weight: 98.5 kg (217 lb 2.5 oz) IBW/kg (Calculated) : 70.7 Heparin Dosing Weight: 89 kg   Vital Signs: Temp: 98.1 F (36.7 C) (05/24 0748) Temp Source: Oral (05/24 0748) BP: 101/52 (05/24 0930) Pulse Rate: 89 (05/24 0930)  Labs: Recent Labs    01/26/20 0351 01/26/20 1200 01/26/20 1615 01/27/20 0404  HGB 8.0*  --   --  8.2*  HCT 24.5*  --   --  25.1*  PLT 233  --   --  313  APTT 68*  --   --  66*  HEPARINUNFRC 0.22* 0.32  --  0.47  CREATININE 2.83*  --  2.23* 1.88*    Estimated Creatinine Clearance: 34.4 mL/min (A) (by C-G formula based on SCr of 1.88 mg/dL (H)).   Medical History: Past Medical History:  Diagnosis Date  . Acute respiratory failure ([place])   . BPH (benign prostatic hyperplasia)   . Cardiac arrest ([place])   . Carpal tunnel syndrome   . CKD (chronic kidney disease)   . Diabetes ([place])   . DM II (diabetes mellitus, type II), controlled ([place])   . High risk medication use   . Hyperlipidemia   . Hypertension   . Low vitamin D level   . STEMI (ST elevation myocardial infarction) ([place])    01/22/18 PCI/DES to RCA  . Systolic heart failure ([place])   . VT (ventricular tachycardia) (H[facility])     Medications:  Medications Prior to Admission  Medication Sig Dispense Refill Last Dose  . amiodarone (PACERONE) 200 MG tablet Take one tablet by mouth daily Monday through Saturday.  Do NOT take on Sunday. (Patient taking differently: Take 200 mg by mouth See admin instructions. Take 200 mg by mouth in the morning on Mon/Tues/Wed/Thurs/Fri/Sat and nothing on Sunday) 90 tablet 3 01/25/2020 at am  . amLODipine (NORVASC) 10 MG tablet Take 10 mg by mouth daily.    01/23/2020 at am  . ammonium lactate (LAC-HYDRIN) 12 % lotion Apply 1 application topically 2 (two) times daily as needed (to affected areas of legs and hands).     01/12/2020 at Unknown time  . aspirin EC 81 MG tablet Take 81 mg by mouth daily.   01/29/2020 at 0800  . atorvastatin (LIPITOR) 80 MG tablet TAKE 1 TABLET BY MOUTH ONCE DAILY AT  6  PM (Patient taking differently: Take 80 mg by mouth See admin instructions. Take 80 mg by mouth at 5 PM daily) 90 tablet 1 01/14/2020 at pm  . clopidogrel (PLAVIX) 75 MG tablet Take 1 tablet by mouth once daily (Patient taking differently: Take 75 mg by mouth daily. ) 90 tablet 3 01/26/2020 at 0800  . FERREX 150 150 MG capsule TAKE 1 CAPSULE BY MOUTH ONCE DAILY (Patient taking differently: Take 150 mg by mouth daily. ) 90 capsule 3 01/10/2020 at am  . furosemide (LASIX) 40 MG tablet Take 1 tablet (40 mg total) by mouth daily. (Patient taking differently: Take 40 mg by mouth See admin instructions. Take 40 mg by mouth in the morning and 40 mg at 1:30 PM) 90 tablet 3 01/27/2020 at am  . gabapentin (NEURONTIN) 300 MG capsule Take 300-600 mg by mouth See admin instructions. Take 300 mg by mouth in the morning and 600 mg at 5 PM  1 01/19/2020 at am  . glimepiride (AMARYL) 2 MG tablet Take 1 mg by  mouth daily with breakfast.    01/17/2020 at am  . isosorbide mononitrate (IMDUR) 30 MG 24 hr tablet Take 1 tablet by mouth once daily (Patient taking differently: Take 30 mg by mouth in the morning. ) 90 tablet 0 01/27/2020 at am  . latanoprost (XALATAN) 0.005 % ophthalmic solution Place 1 drop into both eyes at bedtime.   01/14/2020 at pm  . losartan (COZAAR) 100 MG tablet Take 1 tablet (100 mg total) by mouth daily. (Patient taking differently: Take 100 mg by mouth See admin instructions. Take 100 mg by mouth at 5 PM daily) 90 tablet 3 01/14/2020 at pm  . metoprolol succinate (TOPROL-XL) 50 MG 24 hr tablet Take 1 tablet (50 mg total) by mouth daily. Take with or immediately following a meal. 90 tablet 3 02/03/2020 at 0800  . nitroGLYCERIN (NITROSTAT) 0.4 MG SL tablet Place 1 tablet (0.4 mg total) under the tongue every 5 (five) minutes x 3  doses as needed for chest pain. 25 tablet 1 01/21/2020 at Unknown time  . potassium chloride SA (KLOR-CON M20) 20 MEQ tablet Take 1 tablet (20 mEq total) by mouth daily. 30 tablet 3 01/04/2020 at am  . terazosin (HYTRIN) 1 MG capsule Take 1 mg by mouth See admin instructions. Take 1 mg by mouth at 5 PM daily   01/14/2020 at pm  . triamcinolone cream (KENALOG) 0.1 % Apply 1 application topically as needed (to itching areas of skin).    unk at unk  . vitamin B-12 (CYANOCOBALAMIN) 500 MCG tablet Take 500 mcg by mouth daily.   01/17/2020 at am  . Vitamin D, Ergocalciferol, (DRISDOL) 50000 units CAPS capsule Take 50,000 Units by mouth every Thursday.    01/09/2020 at Unknown time    Assessment: 29 YOM presented to the ED with NSTEMI. He has a history of known coronary artery disease, and in 2019 had DES to RCA.   Patient s/p left and right heart cath with IABP insertion on 5/12. He also has new onset AF RVR this admission started on amiodarone. IABP was removed 5/15. On 5/21, patient was taken back to the cath lab for PCI and atherectomy.  Heparin resumed post PCI drip 950 units/hr HL 0.47  running through PICC line - ensure adequate pause or  peripheral stick from phlebotomy for accurate levels. Hgb 8, pltc 233. No bleeding, complications, or infusion issues noted at this time.   Goal of Therapy:  Heparin level 0.3-0.4 units/ml Monitor platelets by anticoagulation protocol: Yes   Plan:  Continue Heparin drip rate 950 uts/hr  -Daily heparin level and CBC -Monitor for s/sx of bleeding  Bonnita Nasuti Pharm.D. CPP, BCPS Clinical Pharmacist [phone] 01/27/2020 10:09 AM   Please check [email or web address] for unit-specific pharmacist phone numbers

## 2020-01-28 DIAGNOSIS — Z7189 Other specified counseling: Secondary | ICD-10-CM

## 2020-01-28 DIAGNOSIS — Z515 Encounter for palliative care: Secondary | ICD-10-CM

## 2020-01-28 LAB — RENAL FUNCTION PANEL
Albumin: 2.5 g/dL — ABNORMAL LOW (ref 3.5–5.0)
Albumin: 2.2 g/dL — ABNORMAL LOW (ref 3.5–5.0)
Anion gap: 13 (ref 5–15)
Anion gap: 11 (ref 5–15)
BUN: 16 mg/dL (ref 8–23)
BUN: 16 mg/dL (ref 8–23)
CO2: 24 mmol/L (ref 22–32)
CO2: 24 mmol/L (ref 22–32)
Calcium: 8.6 mg/dL — ABNORMAL LOW (ref 8.9–10.3)
Calcium: 8.2 mg/dL — ABNORMAL LOW (ref 8.9–10.3)
Chloride: 96 mmol/L — ABNORMAL LOW (ref 98–111)
Chloride: 95 mmol/L — ABNORMAL LOW (ref 98–111)
Creatinine, Ser: 1.72 mg/dL — ABNORMAL HIGH (ref 0.61–1.24)
Creatinine, Ser: 1.52 mg/dL — ABNORMAL HIGH (ref 0.61–1.24)
GFR calc Af Amer: 42 mL/min — ABNORMAL LOW (ref >60)
GFR calc Af Amer: 48 mL/min — ABNORMAL LOW (ref >60)
GFR calc non Af Amer: 36 mL/min — ABNORMAL LOW (ref >60)
GFR calc non Af Amer: 42 mL/min — ABNORMAL LOW (ref >60)
Glucose, Bld: 204 mg/dL — ABNORMAL HIGH (ref 70–99)
Glucose, Bld: 326 mg/dL — ABNORMAL HIGH (ref 70–99)
Phosphorus: 2.4 mg/dL — ABNORMAL LOW (ref 2.5–4.6)
Phosphorus: 1.8 mg/dL — ABNORMAL LOW (ref 2.5–4.6)
Potassium: 4.6 mmol/L (ref 3.5–5.1)
Potassium: 5.1 mmol/L (ref 3.5–5.1)
Sodium: 133 mmol/L — ABNORMAL LOW (ref 135–145)
Sodium: 130 mmol/L — ABNORMAL LOW (ref 135–145)

## 2020-01-28 LAB — COOXEMETRY PANEL
Carboxyhemoglobin: 0.8 % (ref 0.5–1.5)
Carboxyhemoglobin: 0.6 % (ref 0.5–1.5)
Methemoglobin: 0.7 % (ref 0.0–1.5)
Methemoglobin: 0.7 % (ref 0.0–1.5)
O2 Saturation: 44.3 %
O2 Saturation: 40.8 %
Total hemoglobin: 9.5 g/dL — ABNORMAL LOW (ref 12.0–16.0)
Total hemoglobin: 13.8 g/dL (ref 12.0–16.0)

## 2020-01-28 LAB — CBC
HCT: 24.8 % — ABNORMAL LOW (ref 39.0–52.0)
Hemoglobin: 8 g/dL — ABNORMAL LOW (ref 13.0–17.0)
MCH: 32.7 pg (ref 26.0–34.0)
MCHC: 32.3 g/dL (ref 30.0–36.0)
MCV: 101.2 fL — ABNORMAL HIGH (ref 80.0–100.0)
Platelets: 328 10*3/uL (ref 150–400)
RBC: 2.45 MIL/uL — ABNORMAL LOW (ref 4.22–5.81)
RDW: 15.8 % — ABNORMAL HIGH (ref 11.5–15.5)
WBC: 18 10*3/uL — ABNORMAL HIGH (ref 4.0–10.5)
nRBC: 0.3 % — ABNORMAL HIGH (ref 0.0–0.2)

## 2020-01-28 LAB — GLUCOSE, CAPILLARY
Glucose-Capillary: 176 mg/dL — ABNORMAL HIGH (ref 70–99)
Glucose-Capillary: 186 mg/dL — ABNORMAL HIGH (ref 70–99)
Glucose-Capillary: 296 mg/dL — ABNORMAL HIGH (ref 70–99)
Glucose-Capillary: 185 mg/dL — ABNORMAL HIGH (ref 70–99)

## 2020-01-28 LAB — APTT: aPTT: 58 seconds — ABNORMAL HIGH (ref 24–36)

## 2020-01-28 LAB — MAGNESIUM: Magnesium: 2.4 mg/dL (ref 1.7–2.4)

## 2020-01-28 LAB — HEPARIN LEVEL (UNFRACTIONATED)
Heparin Unfractionated: 0.14 IU/mL — ABNORMAL LOW (ref 0.30–0.70)
Heparin Unfractionated: 0.21 IU/mL — ABNORMAL LOW (ref 0.30–0.70)

## 2020-01-28 MED ORDER — LIP MEDEX EX OINT
TOPICAL_OINTMENT | CUTANEOUS | Status: DC | PRN
  Filled 2020-01-28: qty 7

## 2020-01-28 NOTE — Progress Notes (Signed)
Chaplain engaged in initial visit with Daryl Rodriguez and his daughter.  Chaplain offered prayer over [name] at his request.  Daryl Rodriguez had a number of questions around salvation and his past.  Chaplain and daughter worked together to speak about forgiveness and Jesus' sacrifice on the cross.  Daryl Rodriguez stated that he has done some "wicked things" and that he has never had a truly divine experience with God but chaplain noted the divinity in Daryl Rodriguez being able to even have the chance to ask for forgiveness. Daughter did a great job of bringing comfort to Daryl Rodriguez in his theological questions.   Chaplain will follow-up.

## 2020-01-28 NOTE — Progress Notes (Signed)
ANTICOAGULATION CONSULT NOTE  Pharmacy Consult for heparin Indication: chest pain/ACS/Afib  No Known Allergies  Patient Measurements: Height: 5\' 9"  (175.3 cm) Weight: 98.2 kg (216 lb 7.9 oz) IBW/kg (Calculated) : 70.7 Heparin Dosing Weight: 89 kg   Vital Signs: Temp: 97.4 F (36.3 C) (05/25 0730) Temp Source: Oral (05/25 0730) BP: 115/69 (05/25 0800) Pulse Rate: 73 (05/25 0800)  Labs: Recent Labs    01/26/20 0351 01/26/20 1200 01/27/20 0404 01/27/20 1515 01/28/20 0402 01/28/20 0625  HGB 8.0*  --  8.2*  --  8.0*  --   HCT 24.5*  --  25.1*  --  24.8*  --   PLT 233  --  313  --  328  --   APTT 68*  --  66*  --  58*  --   HEPARINUNFRC 0.22*   < > 0.47  --  0.14* 0.21*  CREATININE 2.83*   < > 1.88* 1.66* 1.72*  --    < > = values in this interval not displayed.    Estimated Creatinine Clearance: 37.6 mL/min (A) (by C-G formula based on SCr of 1.72 mg/dL (H)).   Medical History: Past Medical History:  Diagnosis Date  . Acute respiratory failure ([place])   . BPH (benign prostatic hyperplasia)   . Cardiac arrest ([place])   . Carpal tunnel syndrome   . CKD (chronic kidney disease)   . Diabetes ([place])   . DM II (diabetes mellitus, type II), controlled ([place])   . High risk medication use   . Hyperlipidemia   . Hypertension   . Low vitamin D level   . STEMI (ST elevation myocardial infarction) ([place])    01/22/18 PCI/DES to RCA  . Systolic heart failure ([place])   . VT (ventricular tachycardia) (H[facility])     Medications:  Medications Prior to Admission  Medication Sig Dispense Refill Last Dose  . amiodarone (PACERONE) 200 MG tablet Take one tablet by mouth daily Monday through Saturday.  Do NOT take on Sunday. (Patient taking differently: Take 200 mg by mouth See admin instructions. Take 200 mg by mouth in the morning on Mon/Tues/Wed/Thurs/Fri/Sat and nothing on Sunday) 90 tablet 3 01/14/2020 at am  . amLODipine (NORVASC) 10 MG tablet Take 10 mg by mouth daily.    01/29/2020 at am   . ammonium lactate (LAC-HYDRIN) 12 % lotion Apply 1 application topically 2 (two) times daily as needed (to affected areas of legs and hands).    01/22/2020 at Unknown time  . aspirin EC 81 MG tablet Take 81 mg by mouth daily.   01/09/2020 at 0800  . atorvastatin (LIPITOR) 80 MG tablet TAKE 1 TABLET BY MOUTH ONCE DAILY AT  6  PM (Patient taking differently: Take 80 mg by mouth See admin instructions. Take 80 mg by mouth at 5 PM daily) 90 tablet 1 01/14/2020 at pm  . clopidogrel (PLAVIX) 75 MG tablet Take 1 tablet by mouth once daily (Patient taking differently: Take 75 mg by mouth daily. ) 90 tablet 3 01/14/2020 at 0800  . FERREX 150 150 MG capsule TAKE 1 CAPSULE BY MOUTH ONCE DAILY (Patient taking differently: Take 150 mg by mouth daily. ) 90 capsule 3 02/02/2020 at am  . furosemide (LASIX) 40 MG tablet Take 1 tablet (40 mg total) by mouth daily. (Patient taking differently: Take 40 mg by mouth See admin instructions. Take 40 mg by mouth in the morning and 40 mg at 1:30 PM) 90 tablet 3 01/29/2020 at am  . gabapentin (NEURONTIN)  300 MG capsule Take 300-600 mg by mouth See admin instructions. Take 300 mg by mouth in the morning and 600 mg at 5 PM  1 01/09/2020 at am  . glimepiride (AMARYL) 2 MG tablet Take 1 mg by mouth daily with breakfast.    01/14/2020 at am  . isosorbide mononitrate (IMDUR) 30 MG 24 hr tablet Take 1 tablet by mouth once daily (Patient taking differently: Take 30 mg by mouth in the morning. ) 90 tablet 0 01/24/2020 at am  . latanoprost (XALATAN) 0.005 % ophthalmic solution Place 1 drop into both eyes at bedtime.   01/14/2020 at pm  . losartan (COZAAR) 100 MG tablet Take 1 tablet (100 mg total) by mouth daily. (Patient taking differently: Take 100 mg by mouth See admin instructions. Take 100 mg by mouth at 5 PM daily) 90 tablet 3 01/14/2020 at pm  . metoprolol succinate (TOPROL-XL) 50 MG 24 hr tablet Take 1 tablet (50 mg total) by mouth daily. Take with or immediately following a meal. 90 tablet  3 02/03/2020 at 0800  . nitroGLYCERIN (NITROSTAT) 0.4 MG SL tablet Place 1 tablet (0.4 mg total) under the tongue every 5 (five) minutes x 3 doses as needed for chest pain. 25 tablet 1 01/08/2020 at Unknown time  . potassium chloride SA (KLOR-CON M20) 20 MEQ tablet Take 1 tablet (20 mEq total) by mouth daily. 30 tablet 3 01/04/2020 at am  . terazosin (HYTRIN) 1 MG capsule Take 1 mg by mouth See admin instructions. Take 1 mg by mouth at 5 PM daily   01/14/2020 at pm  . triamcinolone cream (KENALOG) 0.1 % Apply 1 application topically as needed (to itching areas of skin).    unk at unk  . vitamin B-12 (CYANOCOBALAMIN) 500 MCG tablet Take 500 mcg by mouth daily.   01/26/2020 at am  . Vitamin D, Ergocalciferol, (DRISDOL) 50000 units CAPS capsule Take 50,000 Units by mouth every Thursday.    01/09/2020 at Unknown time    Assessment: 31 YOM presented to the ED with NSTEMI. He has a history of known coronary artery disease, and in 2019 had DES to RCA.   Patient s/p left and right heart cath with IABP insertion on 5/12. He also has new onset AF RVR this admission started on amiodarone. IABP was removed 5/15. On 5/21, patient was taken back to the cath lab for PCI and atherectomy.  Heparin resumed post PCI, heparin level subtherapeutic at 0.21 on heparin gtt at 950 units/hr. Hgb 8, pltc WNL. No bleeding, complications, or infusion issues noted at this time. Will increase to 1000 units/hr and check level in the morning unless overall treatment plan changes.   Goal of Therapy:  Heparin level 0.3-0.4 units/ml Monitor platelets by anticoagulation protocol: Yes   Plan:  -Increase heparin infusion to 1000 units/hr -Daily heparin level and CBC -Monitor for s/sx of bleeding  Agnes Lawrence, PharmD PGY1 Pharmacy Resident  Please check [email or web address] for unit-specific pharmacist phone numbers

## 2020-01-28 NOTE — Consult Note (Signed)
Consultation Note Date: 01/28/2020   Patient Name: Daryl Rodriguez  DOB: September 21, 1933  MRN: [id number]  Age / Sex: 86 y.o., male  PCP: Renaldo Reel, PA Referring Physician: Leonie Man, MD  Reason for Consultation: Establishing goals of care  HPI/Patient Profile: 86 y.o. male  with past medical history of CAD s/p stent, diabetes, VT s/p ICD, HTN, STEMI, combined systolic and diastolic CHF EF 40-98%, atrial fibrillation, BPH admitted on 01/26/2020 with chest pain, shortness of breath, and CHF with NSTEMI.   Clinical Assessment and Goals of Care: I have reviewed records and discussed with bedside RN.   I met today at [name] bedside with daughter, Asencion Partridge. Asencion Partridge shares with me her father's wishes to have a meeting tomorrow with family to discuss plan for transition to comfort. She shares that he has been very clear about his wishes for trial of aggressive care but he does not wish to be prolonged in this state without quality of life. She describes him as a larger than life personality who is loved by his family and community. They have been blessed with a good life with many good friends and good memories together. Family does not wish him to suffer either.   We will meet tomorrow with wife, son, daughter, granddaughter at 35 am. Asencion Partridge expresses frustration with visitor restrictions and we discussed given current circumstances of upcoming transition to comfort and being at end of life that visitation will be liberalized today so that family and friends can be allowed to spend some meaningful time with Mr. [name]. Mr. Weekes did awaken and was pleased with plans for family meeting in the morning.   All questions/concerns addressed. Emotional support provided.   Primary Decision Maker PATIENT    SUMMARY OF RECOMMENDATIONS   - Family meeting to discuss transition to comfort care tomorrow 5/26  1000  Code Status/Advance Care Planning:  DNR   Symptom Management:   Chronic back pain with prn OxyIR and tylenol.   Will discuss plan to ensure comfort tomorrow in preparation for de-escalation of care.   Palliative Prophylaxis:   Bowel Regimen, Delirium Protocol, Frequent Pain Assessment, Oral Care and Turn Reposition   Psycho-social/Spiritual:   Desire for further Chaplaincy support:yes  Additional Recommendations: Grief/Bereavement Support  Prognosis:   Overall prognosis poor.   Discharge Planning: Anticipated Hospital Death      Primary Diagnoses: Present on Admission: . NSTEMI (non-ST elevated myocardial infarction) ([place]) . Acute pulmonary edema (HCC) . Acute respiratory failure with hypoxia ([place]) . Coronary artery disease involving native coronary artery of native heart with unstable angina pectoris ([place]) . History of ST elevation myocardial infarction involving right coronary artery ([place]) . Acute on chronic combined systolic and diastolic CHF (congestive heart failure) ([place]) . Type 2 diabetes mellitus with complication, without long-term current use of insulin ([place]) . Essential hypertension . Dyslipidemia, goal LDL below 70 . Acute renal injury due to circulatory failure ([place]) . Cardiogenic shock ([place]) . OSA (obstructive sleep apnea) . Lactic acidosis   I have  reviewed the medical record, interviewed the patient and family, and examined the patient. The following aspects are pertinent.  Past Medical History:  Diagnosis Date  . Acute respiratory failure ([place])   . BPH (benign prostatic hyperplasia)   . Cardiac arrest ([place])   . Carpal tunnel syndrome   . CKD (chronic kidney disease)   . Diabetes ([place])   . DM II (diabetes mellitus, type II), controlled ([place])   . High risk medication use   . Hyperlipidemia   . Hypertension   . Low vitamin D level   . STEMI (ST elevation myocardial infarction) ([place])    01/22/18 PCI/DES to RCA  . Systolic heart  failure ([place])   . VT (ventricular tachycardia) (H[facility])    Social History   Socioeconomic History  . Marital status: Married    Spouse name: Not on file  . Number of children: Not on file  . Years of education: Not on file  . Highest education level: Not on file  Occupational History  . Not on file  Tobacco Use  . Smoking status: Former Research scientist (life sciences)  . Smokeless tobacco: Never Used  . Tobacco comment: quit smoking 50 years ago  Substance and Sexual Activity  . Alcohol use: Not Currently  . Drug use: Not Currently  . Sexual activity: Not on file  Other Topics Concern  . Not on file  Social History Narrative  . Not on file   Social Determinants of Health   Financial Resource Strain:   . Difficulty of Paying Living Expenses:   Food Insecurity:   . Worried About Charity fundraiser in the Last Year:   . Arboriculturist in the Last Year:   Transportation Needs:   . Film/video editor (Medical):   Marland Kitchen Lack of Transportation (Non-Medical):   Physical Activity:   . Days of Exercise per Week:   . Minutes of Exercise per Session:   Stress:   . Feeling of Stress :   Social Connections:   . Frequency of Communication with Friends and Family:   . Frequency of Social Gatherings with Friends and Family:   . Attends Religious Services:   . Active Member of Clubs or Organizations:   . Attends Archivist Meetings:   Marland Kitchen Marital Status:    Family History  Problem Relation Age of Onset  . Heart attack Father 59  . Heart attack Mother   . Stroke Sister    Scheduled Meds: . aspirin EC  81 mg Oral Daily  . atorvastatin  80 mg Oral q1800  . B-complex with vitamin C  1 tablet Oral Daily  . chlorhexidine  15 mL Mouth Rinse BID  . Chlorhexidine Gluconate Cloth  6 each Topical Daily  . clopidogrel  75 mg Oral Daily  . docusate sodium  100 mg Oral Daily  . feeding supplement (ENSURE ENLIVE)  237 mL Oral TID BM  . gabapentin  300 mg Oral Daily  . gabapentin  600 mg Oral Daily  .  insulin aspart  0-15 Units Subcutaneous TID AC & HS  . iron polysaccharides  150 mg Oral Daily  . latanoprost  1 drop Both Eyes QHS  . mouth rinse  15 mL Mouth Rinse q12n4p  . melatonin  3 mg Oral QHS  . midodrine  10 mg Oral TID WC  . sodium chloride flush  10-40 mL Intracatheter Q12H  . spironolactone  12.5 mg Oral Daily  . tamsulosin  0.4 mg Oral Daily  .  vitamin B-12  500 mcg Oral Daily  . Vitamin D (Ergocalciferol)  50,000 Units Oral Q Thu   Continuous Infusions: .  prismasol BGK 4/2.5 400 mL/hr at 01/28/20 0534  .  prismasol BGK 4/2.5 200 mL/hr at 01/27/20 1632  . sodium chloride Stopped (01/16/20 1752)  . amiodarone 30 mg/hr (01/28/20 1000)  . heparin 1,000 Units/hr (01/28/20 1000)  . milrinone 0.2 mcg/kg/min (01/28/20 1000)  . norepinephrine (LEVOPHED) Adult infusion 9 mcg/min (01/28/20 1000)  . prismasol BGK 4/2.5 2,000 mL/hr at 01/28/20 0535   PRN Meds:.sodium chloride, acetaminophen, alteplase, heparin, nitroGLYCERIN, ondansetron (ZOFRAN) IV, sodium chloride, sodium chloride flush, sorbitol No Known Allergies Review of Systems  Constitutional: Positive for activity change, appetite change and fatigue.  Respiratory: Negative for shortness of breath.   Musculoskeletal: Positive for back pain.  Neurological: Positive for weakness.    Physical Exam Vitals and nursing note reviewed.  Constitutional:      General: He is not in acute distress.    Appearance: He is ill-appearing.  Cardiovascular:     Rate and Rhythm: Normal rate.  Pulmonary:     Effort: No tachypnea, accessory muscle usage or respiratory distress.  Abdominal:     General: Abdomen is flat.  Neurological:     Mental Status: He is alert, oriented to person, place, and time and easily aroused.     Comments: With some confusion/hallucinations intermittently     Vital Signs: BP (!) 106/50   Pulse 72   Temp (!) 97.4 F (36.3 C) (Oral)   Resp 16   Ht 5' 9"  (1.753 m)   Wt 98.2 kg   SpO2 92%   BMI  31.97 kg/m  Pain Scale: 0-10 POSS *See Group Information*: 1-Acceptable,Awake and alert Pain Score: 0-No pain   SpO2: SpO2: 92 % O2 Device:SpO2: 92 % O2 Flow Rate: .O2 Flow Rate (L/min): 15 L/min(+15)  IO: Intake/output summary:   Intake/Output Summary (Last 24 hours) at 01/28/2020 1054 Last data filed at 01/28/2020 1000 Gross per 24 hour  Intake 2013.29 ml  Output 4908 ml  Net -2894.71 ml    LBM: Last BM Date: 01/27/20 Baseline Weight: Weight: 96.2 kg Most recent weight: Weight: 98.2 kg     Palliative Assessment/Data:     Time In: 1100 Time Out: 1200 Time Total: 60 min Greater than 50%  of this time was spent counseling and coordinating care related to the above assessment and plan.  Signed by: Vinie Sill, NP Palliative Medicine Team Pager # [phone] (M-F 8a-5p) Team Phone # [phone] (Nights/Weekends)

## 2020-01-28 NOTE — Progress Notes (Signed)
[facility] Progress Note  Subjective: CRRT running fine, no substantial improvement and he continues to plan for comfort care tomorrow if not feeling much improved; family at bedside  Vitals:   01/28/20 1000 01/28/20 1100 01/28/20 1200 01/28/20 1300  BP: (!) 106/50 (!) 90/50 (!) 101/47 (!) 104/49  Pulse: 72 73 73 74  Resp: 16 16 15 15   Temp:   98.1 F (36.7 C)   TempSrc:   Axillary   SpO2: 92% 91% (!) 88% 92%  Weight:      Height:        Exam: Gen alert, nasal O2, on CRRT R IJ temp cath Chest bibasilar soft rales RRR no MRG Abd soft ntnd no mass or ascites +bs GU normal male MS no joint effusions or deformity Ext diffuse 2-3+ edema of UE's, hips > pretib   Neuro is alert, Ox 3 , nf    Home meds: - amio 200/ norvasc 10/ asa 81/ lipitor 80/ plavix 75/ lasix 40 bid/ imdur 30/ sl ntg prn/ kdur 20 qd/ metoprolol xl 50 qd/ losartan 100 qd - amaryl 1mg  am - gabapentin 300 qd  - prn's/ vitamins/ supplements     CXR 5/21 - ^'d pulm edema / effusions   BP's 108/55 on IV milrinone/ amio, no pressors    UA 5/17 - many bact, > 50 rbc/ wbc    Rocephin d# 6    IV vanc sp 4d , dc'd on 5/21     Baseline creat = 1.2- 1.6 , 2019- 2021, CKD III  Assessment/ Plan: 1. AoCKD3 - related to contrast, CKD3, syst CHF.  Declining UOP and worsening pulm / peripheral edema on IV milrinone in ICU prompting CRRT 5/22. No plans for long term HD and if no global improvement pt request comfort care tomorrow.  We will continue with UF with CRRT for now but can d/c at any time according to his wishes.  2. CAD/ NSTEMI - sp PCI to LAD. Still needs work on LCX.  3. Syst CHF - LV 40-45%, ^^LVEDP by cath , vol overload w/ pulm / peripheral edema 4. Atrial fibrillation - on IV hep 5. DM2 - on oral agents at home 6. VTach sp ICD 7. HTN -  Modest hypotension now, being treated with milrinine and NE.    Jannifer Hick MD [facility] Pager [phone]   Recent Labs  Lab  01/27/20 0404 01/27/20 0404 01/27/20 1515 01/28/20 0402  K 4.8   < > 4.8 4.6  BUN 17   < > 17 16  CREATININE 1.88*   < > 1.66* 1.72*  CALCIUM 8.6*   < > 8.4* 8.6*  PHOS 2.8   < > 1.9* 2.4*  HGB 8.2*  --   --  8.0*   < > = values in this interval not displayed.   Inpatient medications: . aspirin EC  81 mg Oral Daily  . atorvastatin  80 mg Oral q1800  . B-complex with vitamin C  1 tablet Oral Daily  . chlorhexidine  15 mL Mouth Rinse BID  . Chlorhexidine Gluconate Cloth  6 each Topical Daily  . clopidogrel  75 mg Oral Daily  . docusate sodium  100 mg Oral Daily  . feeding supplement (ENSURE ENLIVE)  237 mL Oral TID BM  . gabapentin  300 mg Oral Daily  . gabapentin  600 mg Oral Daily  . insulin aspart  0-15 Units Subcutaneous TID AC & HS  . iron polysaccharides  150  mg Oral Daily  . latanoprost  1 drop Both Eyes QHS  . mouth rinse  15 mL Mouth Rinse q12n4p  . melatonin  3 mg Oral QHS  . midodrine  10 mg Oral TID WC  . sodium chloride flush  10-40 mL Intracatheter Q12H  . spironolactone  12.5 mg Oral Daily  . tamsulosin  0.4 mg Oral Daily  . vitamin B-12  500 mcg Oral Daily  . Vitamin D (Ergocalciferol)  50,000 Units Oral Q Thu   .  prismasol BGK 4/2.5 400 mL/hr at 01/28/20 0534  .  prismasol BGK 4/2.5 200 mL/hr at 01/27/20 1632  . sodium chloride Stopped (01/16/20 1752)  . amiodarone 30 mg/hr (01/28/20 1400)  . heparin 1,000 Units/hr (01/28/20 1400)  . milrinone 0.2 mcg/kg/min (01/28/20 1400)  . norepinephrine (LEVOPHED) Adult infusion 9 mcg/min (01/28/20 1400)  . prismasol BGK 4/2.5 2,000 mL/hr at 01/28/20 0535   sodium chloride, acetaminophen, alteplase, heparin, lip balm, nitroGLYCERIN, ondansetron (ZOFRAN) IV, sodium chloride, sodium chloride flush, sorbitol

## 2020-01-28 NOTE — Progress Notes (Signed)
Patient refuses Bipap tonight. Will continue monitoring respiratory status.

## 2020-01-28 NOTE — Progress Notes (Addendum)
Advanced Heart Failure Rounding Note  PCP-Cardiologist: [name], MD     Patient Profile   86 y/o male with h.o CAD, DM2, VT s/p ICD, HTN admitted with NSTEMI and acute systolic HF. Cath by Dr. [name] revealed severe 2v CAD in LD and LCX with borderline ostial LM lesion. Filling pressures markedly elevated with low output. Lactic acid up. IABP placed and milrinone started. Echo w/ EF 40-45%. Seen by TCTS and felt not to be surgical candidate. Plan is for staged PCI.   Subjective:    5/14 was in AF w/ RVR and developed CP. Converted w/ IV amio 5/15 IABP removed 5/16 developed recurrent pulmonary edema, improved w/ IV Lasix 5/17 found to have UTI. Foley removed. Cultures pending. On broad spectrum abx, Vanc + ceftriaxone. mTemp past 24 hrs 101.6. CBC pending.   5/18 back in Afib, in the setting of UTI. Rebolused w/ Amio==>conversion back to NSR 5/20 PCI/DES with CSI of LAD 5/22 started CVVHD  Remains on CVVHD.   On norepi 9 mcg + milrinone 0.25 mcg + amio 30 mg. Midodrine was increased to 10 mg tid.   CO-OX 44%   WBC increasing 14>19>18 . Blood Cultures.   Asking questions about when switches to comfort care. Having a hard time sleeping and uncomfortable in the bed.     Objective:   Weight Range: 98.2 kg Body mass index is 31.97 kg/m.   Vital Signs:   Temp:  [97.4 F (36.3 C)-99.7 F (37.6 C)] 97.4 F (36.3 C) (05/25 0730) Pulse Rate:  [66-101] 73 (05/25 0800) Resp:  [14-26] 15 (05/25 0800) BP: (72-115)/(40-84) 115/69 (05/25 0800) SpO2:  [82 %-100 %] 88 % (05/25 0800) Weight:  [98.2 kg] 98.2 kg (05/25 0740) Last BM Date: 01/27/20  Weight change: Filed Weights   01/26/20 0600 01/27/20 0425 01/28/20 0740  Weight: 100.2 kg 98.5 kg 98.2 kg    Intake/Output:   Intake/Output Summary (Last 24 hours) at 01/28/2020 0851 Last data filed at 01/28/2020 0800 Gross per 24 hour  Intake 2045.4 ml  Output 4701 ml  Net -2655.6 ml      Physical Exam  CVP  12.  General:  No resp difficulty HEENT: normal. RIJ on HD  Neck: supple. JVP 12-13. Carotids 2+ bilat; no bruits. No lymphadenopathy or thryomegaly appreciated. Cor: PMI nondisplaced. irregular rate & rhythm. No rubs, gallops or murmurs. Lungs: Decreased in the bases.  Abdomen: soft, nontender, nondistended. No hepatosplenomegaly. No bruits or masses. Good bowel sounds. Extremities: no cyanosis, clubbing, rash, edema Neuro: alert & orientedx3, cranial nerves grossly intact. moves all 4 extremities w/o difficulty. Affect flat   Telemetry   In and out of A fib 90s    Labs    CBC Recent Labs    01/27/20 0404 01/28/20 0402  WBC 19.2* 18.0*  HGB 8.2* 8.0*  HCT 25.1* 24.8*  MCV 100.8* 101.2*  PLT 313 [id number]   Basic Metabolic Panel Recent Labs    01/27/20 0404 01/27/20 0404 01/27/20 1515 01/28/20 0402  NA 134*   < > 130* 133*  K 4.8   < > 4.8 4.6  CL 97*   < > 96* 96*  CO2 24   < > 24 24  GLUCOSE 177*   < > 255* 204*  BUN 17   < > 17 16  CREATININE 1.88*   < > 1.66* 1.72*  CALCIUM 8.6*   < > 8.4* 8.6*  MG 2.4  --   --  2.4  PHOS 2.8   < > 1.9* 2.4*   < > = values in this interval not displayed.   Liver Function Tests Recent Labs    01/27/20 1515 01/28/20 0402  ALBUMIN 2.5* 2.5*   No results for input(s): LIPASE, AMYLASE in the last 72 hours. Cardiac Enzymes No results for input(s): CKTOTAL, CKMB, CKMBINDEX, TROPONINI in the last 72 hours.  BNP: BNP (last 3 results) Recent Labs    01/10/2020 1403  BNP 597.8*    ProBNP (last 3 results) No results for input(s): PROBNP in the last 8760 hours.   D-Dimer No results for input(s): DDIMER in the last 72 hours. Hemoglobin A1C No results for input(s): HGBA1C in the last 72 hours. Fasting Lipid Panel No results for input(s): CHOL, HDL, LDLCALC, TRIG, CHOLHDL, LDLDIRECT in the last 72 hours. Thyroid Function Tests No results for input(s): TSH, T4TOTAL, T3FREE, THYROIDAB in the last 72 hours.  Invalid input(s):  FREET3  Other results:   Imaging    No results found.   Medications:     Scheduled Medications: . aspirin EC  81 mg Oral Daily  . atorvastatin  80 mg Oral q1800  . B-complex with vitamin C  1 tablet Oral Daily  . chlorhexidine  15 mL Mouth Rinse BID  . Chlorhexidine Gluconate Cloth  6 each Topical Daily  . clopidogrel  75 mg Oral Daily  . docusate sodium  100 mg Oral Daily  . feeding supplement (ENSURE ENLIVE)  237 mL Oral TID BM  . gabapentin  300 mg Oral Daily  . gabapentin  600 mg Oral Daily  . insulin aspart  0-15 Units Subcutaneous TID AC & HS  . iron polysaccharides  150 mg Oral Daily  . latanoprost  1 drop Both Eyes QHS  . mouth rinse  15 mL Mouth Rinse q12n4p  . melatonin  3 mg Oral QHS  . midodrine  10 mg Oral TID WC  . sodium chloride flush  10-40 mL Intracatheter Q12H  . spironolactone  12.5 mg Oral Daily  . tamsulosin  0.4 mg Oral Daily  . vitamin B-12  500 mcg Oral Daily  . Vitamin D (Ergocalciferol)  50,000 Units Oral Q Thu    Infusions: .  prismasol BGK 4/2.5 400 mL/hr at 01/28/20 0534  .  prismasol BGK 4/2.5 200 mL/hr at 01/27/20 1632  . sodium chloride Stopped (01/16/20 1752)  . amiodarone 30 mg/hr (01/28/20 0800)  . heparin 1,000 Units/hr (01/28/20 0830)  . milrinone 0.2 mcg/kg/min (01/28/20 0800)  . norepinephrine (LEVOPHED) Adult infusion 9 mcg/min (01/28/20 0800)  . prismasol BGK 4/2.5 2,000 mL/hr at 01/28/20 0535    PRN Medications: sodium chloride, acetaminophen, alteplase, heparin, nitroGLYCERIN, ondansetron (ZOFRAN) IV, sodium chloride, sodium chloride flush, sorbitol   Assessment/Plan   1. NSTEMI  -Known coronary disease. In 2019 had DES to RCA.  -LHC 01/12/2020 with multivessel disease including LM disease  - On ASA 81   - On atorvastatin 80 mg daily  - case reviewed extensively with Dr. [name] and Dr.Owen. Agree that best plan is for MV atherectomy/stenting including possible ostial LM. - IABP removal 5/15. No active CP -  Underwent CSI with PCI/DES to LAD on [id number] complicated by slow reflow  - On DAPT and heparin - Still needs PCI Lcx but will likely have to wait a few weeks - Off b-blocker with shock - No chest pain.   2. Cardiogenic Shock/Acute Systolic Heart Failure in the setting of NSTEMI - ICM. ECHO 2020 EF 55%--->  EF now down 40-45%  - IABP placed in cath lab 5/12. Stable after IABP removal 5/15 - On milrione 0.2 mcg + Norepi 9 mcg .  - Developed recurrent pulmonary edema post PCI - CRRT initiated 5/22. Unable to pull due to low BP -Midodrine increaesd to 10 mg tid+ norepi 9 mcg.  - CO-OX 44%.  - CVP 12.   3. Acute Hypoxic Respiratory Failure due to pulmonary edema -Has been on and off BiPap.  - Continue HFNC.   4. AKI  - Creatinine baseline 1-1.2 -> 2.7 -> 3.8->2.23>1.88->1.72  - Likely due to HF/shock/ATN + CIN -. CRRT initiated 5/22. Unable to pull due to low BP - Continue norepi + midodrine.  - Does not want long-term HD  5. OSA  - Uses CPAP at home  -  Continue at night.   6. H/O of VT - Has ICD - on PO amiodarone 200 mg daily PTA - Continue IV amiodarone for rapid AFib - Rate controlled. Cut back amio to 30 mg per hour.  - Keep K> 4.0 Mg > 2.0  8. Afib w/ RVR .  Cut back amio to 30 mg per hour.  - Continue heparin  9. Hypokalemia/hyperkalemia - K 4.6  - managed with CRRT  11. UTI  - 5/17 UA +. Urine culture not sent - Treated with vanc + ceftriaxone x 5 days - foley removed, had difficulty voiding so flomax was started.   12. Acute blood loss anemia - Hgb 8   13. Leukocytosis - WBC trending up 14>19- 18  - Repeat blood cx.   14. DNR/DNI - we discussed code status with him and his daughter. Would not want intubation or CPR. (shock ok)  Consult Palliative Care for [place]. He is clear that he does not want to continue because he says its no way to live. I discussed with his daughter, Asencion Partridge and she is agreeable and understands all his wishes. I have talked to the  charge nurse about the plan. Should be able to allow additional visitors to be with him starting today. Liberalize his diet.   He is very clear that if has not improved by Wednesday he wants to switch to comfort care. He is very concerned about "drowning and pain" . I assured him that we will honor his wishes.     Length of Stay: [place], NP  01/28/2020, 8:51 AM  Advanced Heart Failure Team Pager [phone] (M-F; 7a - 4p)  Please contact [facility] for night-coverage after hours (4p -7a ) and weekends on [email or web address]  Agree with above.   Remains on CVVHD. Anuric. Weight down 1 pound. Now on dual pressors to maintain BP. Co-ox low. WBC up. He is tired and restless. Asking about comfort care  General:  Weak appearing. No resp difficulty HEENT: normal Neck: supple. RIJ trialyis. Carotids 2+ bilat; no bruits. No lymphadenopathy or thryomegaly appreciated. Cor: PMI nondisplaced. Regular rate & rhythm. No rubs, gallops or murmurs. Lungs: coarse Abdomen: soft, nontender, nondistended. No hepatosplenomegaly. No bruits or masses. Good bowel sounds. Extremities: no cyanosis, clubbing, rash, 1+ edema Neuro: alert & orientedx3, cranial nerves grossly intact. moves all 4 extremities w/o difficulty. Affect pleasant  Cardiac output remains quite low despite dual pressors. He is anuric on CRRT Not sure he wants to continue but says he will give it one more day to try. Cx pending. But given rising WBC likely reasonable to restart abx to give him every chance possible to improve. Likely  switch to comfort care tomorrow if things not improving.   CRITICAL CARE Performed by: Glori Bickers  Total critical care time: 35 minutes  Critical care time was exclusive of separately billable procedures and treating other patients.  Critical care was necessary to treat or prevent imminent or life-threatening deterioration.  Critical care was time spent personally by me (independent of midlevel  providers or residents) on the following activities: development of treatment plan with patient and/or surrogate as well as nursing, discussions with consultants, evaluation of patient's response to treatment, examination of patient, obtaining history from patient or surrogate, ordering and performing treatments and interventions, ordering and review of laboratory studies, ordering and review of radiographic studies, pulse oximetry and re-evaluation of patient's condition.  Glori Bickers, MD  10:14 AM

## 2020-01-29 LAB — CBC
HCT: 25.3 % — ABNORMAL LOW (ref 39.0–52.0)
Hemoglobin: 8.1 g/dL — ABNORMAL LOW (ref 13.0–17.0)
MCH: 32.4 pg (ref 26.0–34.0)
MCHC: 32 g/dL (ref 30.0–36.0)
MCV: 101.2 fL — ABNORMAL HIGH (ref 80.0–100.0)
Platelets: 314 10*3/uL (ref 150–400)
RBC: 2.5 MIL/uL — ABNORMAL LOW (ref 4.22–5.81)
RDW: 15.8 % — ABNORMAL HIGH (ref 11.5–15.5)
WBC: 17.7 10*3/uL — ABNORMAL HIGH (ref 4.0–10.5)
nRBC: 1.1 % — ABNORMAL HIGH (ref 0.0–0.2)

## 2020-01-29 LAB — COOXEMETRY PANEL
Carboxyhemoglobin: 0.9 % (ref 0.5–1.5)
Methemoglobin: 0.8 % (ref 0.0–1.5)
O2 Saturation: 46.1 %
Total hemoglobin: 7.7 g/dL — ABNORMAL LOW (ref 12.0–16.0)

## 2020-01-29 LAB — MAGNESIUM: Magnesium: 2.4 mg/dL (ref 1.7–2.4)

## 2020-01-29 LAB — RENAL FUNCTION PANEL
Albumin: 2.5 g/dL — ABNORMAL LOW (ref 3.5–5.0)
Anion gap: 11 (ref 5–15)
BUN: 18 mg/dL (ref 8–23)
CO2: 25 mmol/L (ref 22–32)
Calcium: 8.6 mg/dL — ABNORMAL LOW (ref 8.9–10.3)
Chloride: 95 mmol/L — ABNORMAL LOW (ref 98–111)
Creatinine, Ser: 1.61 mg/dL — ABNORMAL HIGH (ref 0.61–1.24)
GFR calc Af Amer: 45 mL/min — ABNORMAL LOW (ref >60)
GFR calc non Af Amer: 39 mL/min — ABNORMAL LOW (ref >60)
Glucose, Bld: 170 mg/dL — ABNORMAL HIGH (ref 70–99)
Phosphorus: 1.9 mg/dL — ABNORMAL LOW (ref 2.5–4.6)
Potassium: 4.5 mmol/L (ref 3.5–5.1)
Sodium: 131 mmol/L — ABNORMAL LOW (ref 135–145)

## 2020-01-29 LAB — HEPARIN LEVEL (UNFRACTIONATED): Heparin Unfractionated: 0.12 IU/mL — ABNORMAL LOW (ref 0.30–0.70)

## 2020-01-29 LAB — GLUCOSE, CAPILLARY: Glucose-Capillary: 212 mg/dL — ABNORMAL HIGH (ref 70–99)

## 2020-01-29 LAB — APTT: aPTT: 61 seconds — ABNORMAL HIGH (ref 24–36)

## 2020-01-29 MED ORDER — GLYCOPYRROLATE 0.2 MG/ML IJ SOLN: 0.4000 mg | Freq: Four times a day (QID) | INTRAMUSCULAR | Status: DC

## 2020-01-29 MED ORDER — GLYCOPYRROLATE 0.2 MG/ML IJ SOLN
0.4000 mg | INTRAMUSCULAR | Status: DC
  Administered 2020-01-29 – 2020-01-30 (×5): 0.4 mg via INTRAVENOUS
  Filled 2020-01-29 (×5): qty 2

## 2020-01-29 MED ORDER — LORAZEPAM 2 MG/ML IJ SOLN: 1.0000 mg | INTRAMUSCULAR | Status: DC | PRN

## 2020-01-29 MED ORDER — HYDROMORPHONE HCL 1 MG/ML IJ SOLN
0.5000 mg | INTRAMUSCULAR | Status: DC
  Administered 2020-01-29 – 2020-01-30 (×4): 0.5 mg via INTRAVENOUS
  Filled 2020-01-29 (×4): qty 0.5

## 2020-01-29 MED ORDER — HYDROMORPHONE HCL 1 MG/ML IJ SOLN: 0.5000 mg | INTRAMUSCULAR | Status: AC | PRN

## 2020-01-29 MED ORDER — HYDROMORPHONE HCL 1 MG/ML IJ SOLN: 0.5000 mg | INTRAMUSCULAR | Status: DC | PRN

## 2020-01-29 MED ORDER — AMIODARONE LOAD VIA INFUSION
150.0000 mg | Freq: Once | INTRAVENOUS | Status: DC
  Filled 2020-01-29: qty 83.34

## 2020-01-29 NOTE — Progress Notes (Signed)
Palliative:  HPI: 86 y.o. male  with past medical history of CAD s/p stent, diabetes, VT s/p ICD, HTN, STEMI, combined systolic and diastolic CHF EF 12-45%, atrial fibrillation, BPH admitted on 01/26/2020 with chest pain, shortness of breath, and CHF with NSTEMI.    I met today with Daryl Rodriguez and family (wife, son, daughter, granddaughter) at bedside. Mr. Claudie Revering was able to again explain his goals which are very clear that he has had a wonderful and full life. He is unhappy with his current quality of life and knows that he will not improve to an acceptable quality of life for him. With this in mind he wishes to move forward with transition to comfort focused care. Family at bedside are supportive of Daryl Rodriguez and his decisions.   Plan was made to spend time with further visits from family and friends with transition to full comfort care tonight or tomorrow. They would like to ensure that he does not suffer and would like to pursue opioid infusion to ensure consistent comfort during this transition.   I returned to bedside at the end of the day. CRRT has been stopped. He has had visits from everyone that he desired to visit with today. Decision made to continue infusions but to add some scheduled and liberal prn medications overnight to ensure comfort (they do not want to make full transition without his wife present so will proceed in the morning).   All questions/concerns addressed. Emotional support provided. Discussed plan with bedside RN.   Exam:  Plan: - Slowly transitioning to comfort care. No escalation of care.  - Comfort is priority and may escalate medication as needed to achieve comfort.  - Anticipate transition to full comfort care in the morning.  - Liberalized visitation allowed given end of life status verified with charge RN. Family plan to stay the night with him.   [place], NP Palliative Medicine Team Pager [phone] (Please see [email or web address] for  schedule) Team Phone [phone]    Greater than 50%  of this time was spent counseling and coordinating care related to the above assessment and plan

## 2020-01-29 NOTE — Progress Notes (Signed)
Chaplain engaged in follow-up visit with Daryl Rodriguez and his family.  Chaplain prayed with family around [name].    [name]'s family expressed a gratefulness for chaplain's visit and prayer.  Chaplain will follow-up as needed.

## 2020-01-29 NOTE — Progress Notes (Addendum)
Advanced Heart Failure Rounding Note  PCP-Cardiologist: [name], MD     Patient Profile   86 y/o male with h.o CAD, DM2, VT s/p ICD, HTN admitted with NSTEMI and acute systolic HF. Cath by Dr. [name] revealed severe 2v CAD in LD and LCX with borderline ostial LM lesion. Filling pressures markedly elevated with low output. Lactic acid up. IABP placed and milrinone started. Echo w/ EF 40-45%. Seen by TCTS and felt not to be surgical candidate. Plan is for staged PCI.   Subjective:    5/14 was in AF w/ RVR and developed CP. Converted w/ IV amio 5/15 IABP removed 5/16 developed recurrent pulmonary edema, improved w/ IV Lasix 5/17 found to have UTI. Foley removed. Cultures pending. On broad spectrum abx, Vanc + ceftriaxone. mTemp past 24 hrs 101.6. CBC pending.   5/18 back in Afib, in the setting of UTI. Rebolused w/ Amio==>conversion back to NSR 5/20 PCI/DES with CSI of LAD 5/22 started CVVHD  Remains on CVVHD.   On norepi 84mcg + milrinone 0.25 mcg + amio 30 mg. Midodrine was increased to 10 mg tid.   CO-OX 46%   WBC 17  Blood cultures pending.    Having a hard time sleeping.    Objective:   Weight Range: 94.3 kg Body mass index is 30.7 kg/m.   Vital Signs:   Temp:  [97.7 F (36.5 C)-98.9 F (37.2 C)] 98.3 F (36.8 C) (05/26 0640) Pulse Rate:  [57-75] 63 (05/26 0700) Resp:  [13-31] 15 (05/26 0700) BP: (80-120)/(37-85) 94/41 (05/26 0700) SpO2:  [87 %-97 %] 92 % (05/26 0700) Weight:  [94.3 kg] 94.3 kg (05/26 0600) Last BM Date: 01/28/20  Weight change: Filed Weights   01/27/20 0425 01/28/20 0740 01/29/20 0600  Weight: 98.5 kg 98.2 kg 94.3 kg    Intake/Output:   Intake/Output Summary (Last 24 hours) at 01/29/2020 0749 Last data filed at 01/29/2020 0700 Gross per 24 hour  Intake 2226.47 ml  Output 4110 ml  Net -1883.53 ml      Physical Exam  CVP 111-12  General:  Appears weak  No resp difficulty HEENT: normal Neck: supple. JVP 11-12 .  Carotids 2+ bilat; no bruits. No lymphadenopathy or thryomegaly appreciated. RIJ  Cor: PMI nondisplaced. Irregular rate & rhythm. No rubs, gallops or murmurs. Lungs: clear Abdomen: soft, nontender, nondistended. No hepatosplenomegaly. No bruits or masses. Good bowel sounds. Extremities: no cyanosis, clubbing, rash, edema Neuro: alert & orientedx3, cranial nerves grossly intact. moves all 4 extremities w/o difficulty. Affect flat  Telemetry   In and out of A fib 40-50s    Labs    CBC Recent Labs    01/28/20 0402 01/29/20 0324  WBC 18.0* 17.7*  HGB 8.0* 8.1*  HCT 24.8* 25.3*  MCV 101.2* 101.2*  PLT 328 [id number]   Basic Metabolic Panel Recent Labs    01/28/20 0402 01/28/20 1600 01/29/20 0324  NA 133* 130*  --   K 4.6 5.1  --   CL 96* 95*  --   CO2 24 24  --   GLUCOSE 204* 326*  --   BUN 16 16  --   CREATININE 1.72* 1.52*  --   CALCIUM 8.6* 8.2*  --   MG 2.4  --  2.4  PHOS 2.4* 1.8*  --    Liver Function Tests Recent Labs    01/28/20 0402 01/28/20 1600  ALBUMIN 2.5* 2.2*   No results for input(s): LIPASE, AMYLASE in the last 72 hours. Cardiac  Enzymes No results for input(s): CKTOTAL, CKMB, CKMBINDEX, TROPONINI in the last 72 hours.  BNP: BNP (last 3 results) Recent Labs    01/28/2020 1403  BNP 597.8*    ProBNP (last 3 results) No results for input(s): PROBNP in the last 8760 hours.   D-Dimer No results for input(s): DDIMER in the last 72 hours. Hemoglobin A1C No results for input(s): HGBA1C in the last 72 hours. Fasting Lipid Panel No results for input(s): CHOL, HDL, LDLCALC, TRIG, CHOLHDL, LDLDIRECT in the last 72 hours. Thyroid Function Tests No results for input(s): TSH, T4TOTAL, T3FREE, THYROIDAB in the last 72 hours.  Invalid input(s): FREET3  Other results:   Imaging    No results found.   Medications:     Scheduled Medications: . aspirin EC  81 mg Oral Daily  . atorvastatin  80 mg Oral q1800  . B-complex with vitamin C  1 tablet  Oral Daily  . chlorhexidine  15 mL Mouth Rinse BID  . Chlorhexidine Gluconate Cloth  6 each Topical Daily  . clopidogrel  75 mg Oral Daily  . docusate sodium  100 mg Oral Daily  . feeding supplement (ENSURE ENLIVE)  237 mL Oral TID BM  . gabapentin  300 mg Oral Daily  . gabapentin  600 mg Oral Daily  . insulin aspart  0-15 Units Subcutaneous TID AC & HS  . iron polysaccharides  150 mg Oral Daily  . latanoprost  1 drop Both Eyes QHS  . mouth rinse  15 mL Mouth Rinse q12n4p  . melatonin  3 mg Oral QHS  . midodrine  10 mg Oral TID WC  . sodium chloride flush  10-40 mL Intracatheter Q12H  . spironolactone  12.5 mg Oral Daily  . tamsulosin  0.4 mg Oral Daily  . vitamin B-12  500 mcg Oral Daily  . Vitamin D (Ergocalciferol)  50,000 Units Oral Q Thu    Infusions: .  prismasol BGK 4/2.5 400 mL/hr at 01/29/20 0701  .  prismasol BGK 4/2.5 200 mL/hr at 01/27/20 1632  . sodium chloride Stopped (01/16/20 1752)  . amiodarone 30 mg/hr (01/29/20 0700)  . heparin 1,000 Units/hr (01/29/20 0700)  . milrinone 0.2 mcg/kg/min (01/29/20 0700)  . norepinephrine (LEVOPHED) Adult infusion 11 mcg/min (01/29/20 0700)  . prismasol BGK 4/2.5 2,000 mL/hr at 01/29/20 0330    PRN Medications: sodium chloride, acetaminophen, alteplase, heparin, lip balm, nitroGLYCERIN, ondansetron (ZOFRAN) IV, sodium chloride, sodium chloride flush, sorbitol   Assessment/Plan   1. NSTEMI  -Known coronary disease. In 2019 had DES to RCA.  -LHC 02/02/2020 with multivessel disease including LM disease  - On ASA 81   - On atorvastatin 80 mg daily  - case reviewed extensively with Dr. [name] and Dr.Owen. Agree that best plan is for MV atherectomy/stenting including possible ostial LM. - IABP removal 5/15. No active CP - Underwent CSI with PCI/DES to LAD on [id number] complicated by slow reflow  - On DAPT and heparin - Still needs PCI Lcx  - Off b-blocker with shock - No chest pain.   2. Cardiogenic Shock/Acute Systolic Heart  Failure in the setting of NSTEMI - ICM. ECHO 2020 EF 55%---> EF now down 40-45%  - IABP placed in cath lab 5/12. Stable after IABP removal 5/15 - On milrione 0.2 mcg + Norepi 9 mcg .  - Developed recurrent pulmonary edema post PCI - CRRT initiated 5/22. Unable to pull due to low BP -Midodrine increaesd to 10 mg tid+ norepi 11 mcg.  -  CO-OX 46%   3. Acute Hypoxic Respiratory Failure due to pulmonary edema -Has been on and off BiPap.  - Continue HFNC.   4. AKI  - Creatinine baseline 1-1.2  - Renal function pending.   - Likely due to HF/shock/ATN + CIN -. CRRT initiated 5/22. Unable to pull due to low BP - Continue norepi + midodrine.  - Does not want long-term HD  5. OSA  - Uses CPAP at home  -  Continue at night.   6. H/O of VT - Has ICD - on PO amiodarone 200 mg daily PTA - Continue IV amiodarone for rapid AFib - Rate controlled. Cut back amio to 30 mg per hour.  - Keep K> 4.0 Mg > 2.0  8. Afib w/ RVR - Continue amio to 30 mg per hour.  - Continue heparin  9. Hypokalemia/hyperkalemia - managed with CRRT  11. UTI  - 5/17 UA +. Urine culture not sent - Treated with vanc + ceftriaxone x 5 days - foley removed, had difficulty voiding so flomax was started.   12. Acute blood loss anemia - Hgb 8.1   13. Leukocytosis - WBC trending up 14>19- 18 ->17.7 - Repeat blood cx.  - NGTD   14. Goals of Care DNR/DNI - we discussed code status with him and his daughter. Would not want intubation or CPR. (shock ok)   Palliative Care consulted. Family meeting today at 75.       Length of Stay: Winifred, NP  01/29/2020, 7:49 AM  Advanced Heart Failure Team Pager [phone] (M-F; 7a - 4p)  Please contact [facility] for night-coverage after hours (4p -7a ) and weekends on [email or web address]  Agree with above.   Remains on NE 11 and milrinone. Anuric. Remains on CVVHD trying to pull -100 to -125. CVP remains 11 (checked personally). Back in AF this am. Very lethargic.  Says he is tired. Denies CP. + SOB  General:  Weak appearing. Eyes closed mildly tachypneic HEENT: normal Neck: supple. RIJ trialysis cath Carotids 2+ bilat; no bruits. No lymphadenopathy or thryomegaly appreciated. Cor: PMI nondisplaced. irregular rate & rhythm. No rubs, gallops or murmurs. Lungs: + crackles Abdomen: soft, nontender, nondistended. No hepatosplenomegaly. No bruits or masses. Good bowel sounds. Extremities: no cyanosis, clubbing, rash, 1+ edema Neuro: alert & orientedx3, cranial nerves grossly intact. moves all 4 extremities w/o difficulty. Affect pleasant   He remains volume overloaded and dyspneic. We discussed possible time course for renal recovery as being typically 1-2 weeks. Both he and his family don;t feel they can wait that long as he is suffering and feels he has lost his dignity and that he is likely to remain quite debilitated even if his kidneys recover.   Will have family meeting today to discuss options. Family leaning toward comfort care at this point. Will continue to pull with HD as much as possible to help improve respiratory status.   CRITICAL CARE Performed by: Glori Bickers  Total critical care time: 35 minutes  Critical care time was exclusive of separately billable procedures and treating other patients.  Critical care was necessary to treat or prevent imminent or life-threatening deterioration.  Critical care was time spent personally by me (independent of midlevel providers or residents) on the following activities: development of treatment plan with patient and/or surrogate as well as nursing, discussions with consultants, evaluation of patient's response to treatment, examination of patient, obtaining history from patient or surrogate, ordering and performing treatments and interventions, ordering and  review of laboratory studies, ordering and review of radiographic studies, pulse oximetry and re-evaluation of patient's condition.  Glori Bickers, MD  8:28 AM

## 2020-01-29 NOTE — Progress Notes (Signed)
ANTICOAGULATION CONSULT NOTE  Pharmacy Consult for heparin Indication: chest pain/ACS/Afib  No Known Allergies  Patient Measurements: Height: 5\' 9"  (175.3 cm) Weight: 94.3 kg (207 lb 14.3 oz) IBW/kg (Calculated) : 70.7 Heparin Dosing Weight: 89 kg   Vital Signs: Temp: 97.9 F (36.6 C) (05/26 0804) Temp Source: Oral (05/26 0804) BP: 99/48 (05/26 1115) Pulse Rate: 86 (05/26 1115)  Labs: Recent Labs    01/27/20 0404 01/27/20 1515 01/28/20 0402 01/28/20 0625 01/28/20 1600 01/29/20 0324  HGB 8.2*  --  8.0*  --   --  8.1*  HCT 25.1*  --  24.8*  --   --  25.3*  PLT 313  --  328  --   --  314  APTT 66*  --  58*  --   --  61*  HEPARINUNFRC 0.47  --  0.14* 0.21*  --  0.12*  CREATININE 1.88*   < > 1.72*  --  1.52* 1.61*   < > = values in this interval not displayed.    Estimated Creatinine Clearance: 39.4 mL/min (A) (by C-G formula based on SCr of 1.61 mg/dL (H)).   Medical History: Past Medical History:  Diagnosis Date  . Acute respiratory failure ([place])   . BPH (benign prostatic hyperplasia)   . Cardiac arrest ([place])   . Carpal tunnel syndrome   . CKD (chronic kidney disease)   . Diabetes ([place])   . DM II (diabetes mellitus, type II), controlled ([place])   . High risk medication use   . Hyperlipidemia   . Hypertension   . Low vitamin D level   . STEMI (ST elevation myocardial infarction) ([place])    01/22/18 PCI/DES to RCA  . Systolic heart failure ([place])   . VT (ventricular tachycardia) ([facility])     Medications:  Medications Prior to Admission  Medication Sig Dispense Refill Last Dose  . amiodarone (PACERONE) 200 MG tablet Take one tablet by mouth daily Monday through Saturday.  Do NOT take on Sunday. (Patient taking differently: Take 200 mg by mouth See admin instructions. Take 200 mg by mouth in the morning on Mon/Tues/Wed/Thurs/Fri/Sat and nothing on Sunday) 90 tablet 3 02/01/2020 at am  . amLODipine (NORVASC) 10 MG tablet Take 10 mg by mouth daily.    01/12/2020 at am  .  ammonium lactate (LAC-HYDRIN) 12 % lotion Apply 1 application topically 2 (two) times daily as needed (to affected areas of legs and hands).    01/29/2020 at Unknown time  . aspirin EC 81 MG tablet Take 81 mg by mouth daily.   01/31/2020 at 0800  . atorvastatin (LIPITOR) 80 MG tablet TAKE 1 TABLET BY MOUTH ONCE DAILY AT  6  PM (Patient taking differently: Take 80 mg by mouth See admin instructions. Take 80 mg by mouth at 5 PM daily) 90 tablet 1 01/14/2020 at pm  . clopidogrel (PLAVIX) 75 MG tablet Take 1 tablet by mouth once daily (Patient taking differently: Take 75 mg by mouth daily. ) 90 tablet 3 01/12/2020 at 0800  . FERREX 150 150 MG capsule TAKE 1 CAPSULE BY MOUTH ONCE DAILY (Patient taking differently: Take 150 mg by mouth daily. ) 90 capsule 3 01/16/2020 at am  . furosemide (LASIX) 40 MG tablet Take 1 tablet (40 mg total) by mouth daily. (Patient taking differently: Take 40 mg by mouth See admin instructions. Take 40 mg by mouth in the morning and 40 mg at 1:30 PM) 90 tablet 3 01/07/2020 at am  . gabapentin (NEURONTIN) 300  MG capsule Take 300-600 mg by mouth See admin instructions. Take 300 mg by mouth in the morning and 600 mg at 5 PM  1 02/01/2020 at am  . glimepiride (AMARYL) 2 MG tablet Take 1 mg by mouth daily with breakfast.    01/08/2020 at am  . isosorbide mononitrate (IMDUR) 30 MG 24 hr tablet Take 1 tablet by mouth once daily (Patient taking differently: Take 30 mg by mouth in the morning. ) 90 tablet 0 01/05/2020 at am  . latanoprost (XALATAN) 0.005 % ophthalmic solution Place 1 drop into both eyes at bedtime.   01/14/2020 at pm  . losartan (COZAAR) 100 MG tablet Take 1 tablet (100 mg total) by mouth daily. (Patient taking differently: Take 100 mg by mouth See admin instructions. Take 100 mg by mouth at 5 PM daily) 90 tablet 3 01/14/2020 at pm  . metoprolol succinate (TOPROL-XL) 50 MG 24 hr tablet Take 1 tablet (50 mg total) by mouth daily. Take with or immediately following a meal. 90 tablet 3  01/09/2020 at 0800  . nitroGLYCERIN (NITROSTAT) 0.4 MG SL tablet Place 1 tablet (0.4 mg total) under the tongue every 5 (five) minutes x 3 doses as needed for chest pain. 25 tablet 1 02/03/2020 at Unknown time  . potassium chloride SA (KLOR-CON M20) 20 MEQ tablet Take 1 tablet (20 mEq total) by mouth daily. 30 tablet 3 02/01/2020 at am  . terazosin (HYTRIN) 1 MG capsule Take 1 mg by mouth See admin instructions. Take 1 mg by mouth at 5 PM daily   01/14/2020 at pm  . triamcinolone cream (KENALOG) 0.1 % Apply 1 application topically as needed (to itching areas of skin).    unk at unk  . vitamin B-12 (CYANOCOBALAMIN) 500 MCG tablet Take 500 mcg by mouth daily.   01/07/2020 at am  . Vitamin D, Ergocalciferol, (DRISDOL) 50000 units CAPS capsule Take 50,000 Units by mouth every Thursday.    01/09/2020 at Unknown time    Assessment: 4 YOM presented to the ED with NSTEMI. He has a history of known coronary artery disease, and in 2019 had DES to RCA.   Patient s/p left and right heart cath with IABP insertion on 5/12. He also has new onset AF RVR this admission started on amiodarone. IABP was removed 5/15. On 5/21, patient was taken back to the cath lab for PCI and atherectomy.  Heparin level subtherapeutic at 0.12 on heparin gtt at 1000 units/hr this morning. Hgb 8.1, pltc WNL. No bleeding, complications, or infusion issues noted at this time. Will increase to 1200 units/hr. No further levels ordered, will continue heparin for now and assess situation tomorrow morning.   Goal of Therapy:  Heparin level 0.3-0.4 units/ml Monitor platelets by anticoagulation protocol: Yes   Plan:  -Increase heparin to 1200 units/hr -Monitor for s/sx of bleeding and RN will hold if any s/sx present  Agnes Lawrence, PharmD PGY1 Pharmacy Resident  Please check [email or web address] for unit-specific pharmacist phone numbers

## 2020-01-30 MED ORDER — GLYCOPYRROLATE 0.2 MG/ML IJ SOLN: 0.2000 mg | INTRAMUSCULAR | Status: DC | PRN

## 2020-01-30 MED ORDER — DIPHENHYDRAMINE HCL 50 MG/ML IJ SOLN: 12.5000 mg | INTRAMUSCULAR | Status: DC | PRN

## 2020-01-30 MED ORDER — GLYCOPYRROLATE 0.2 MG/ML IJ SOLN: 0.4000 mg | Freq: Three times a day (TID) | INTRAMUSCULAR | Status: DC

## 2020-01-30 MED ORDER — POLYVINYL ALCOHOL 1.4 % OP SOLN
1.0000 [drp] | Freq: Four times a day (QID) | OPHTHALMIC | Status: DC | PRN
  Filled 2020-01-30: qty 15

## 2020-01-30 MED ORDER — ACETAMINOPHEN 650 MG RE SUPP: 650.0000 mg | Freq: Four times a day (QID) | RECTAL | Status: DC | PRN

## 2020-01-30 MED ORDER — SODIUM CHLORIDE 0.9 % IV SOLN
0.2000 mg/h | INTRAVENOUS | Status: DC
  Administered 2020-01-30: 0.2 mg/h via INTRAVENOUS
  Filled 2020-01-30: qty 2.5

## 2020-01-30 MED ORDER — HYDROMORPHONE BOLUS VIA INFUSION
0.5000 mg | INTRAVENOUS | Status: DC | PRN
  Filled 2020-01-30: qty 1

## 2020-01-30 MED ORDER — ACETAMINOPHEN 325 MG PO TABS: 650.0000 mg | ORAL_TABLET | Freq: Four times a day (QID) | ORAL | Status: DC | PRN

## 2020-01-30 MED ORDER — DIPHENHYDRAMINE HCL 50 MG/ML IJ SOLN: 25.0000 mg | INTRAMUSCULAR | Status: DC | PRN

## 2020-02-01 LAB — CULTURE, BLOOD (ROUTINE X 2)
Culture: NO GROWTH
Culture: NO GROWTH
Special Requests: ADEQUATE
Special Requests: ADEQUATE

## 2020-02-04 NOTE — Progress Notes (Signed)
Nutrition Brief Note  Chart reviewed. Pt in the process of transitioning to comfort care.  No further nutrition interventions warranted at this time.  Please re-consult as needed.   Kerman Passey MS, RDN, LDN, CNSC RD Pager Number and RD On-Call Pager Number Located in [place]

## 2020-02-04 NOTE — Progress Notes (Signed)
1339 No BP, No Pulse, No Respirations. Pt is DNR/Comfort Care. Verified by myself and Halina Maidens, RN. Family at bedside. Time of death 69. Dr. Haroldine Laws notified. [facility] notified.

## 2020-02-04 NOTE — Progress Notes (Signed)
[facility]S notified of plans to transfer to comfort care. Referral # [id number]. Call back with TOD to determine potential tissue donation. Bedside RN [facility] notified of this.

## 2020-02-04 NOTE — Progress Notes (Signed)
Palliative:  HPI: 86 y.o.malewith past medical history of CAD s/p stent, diabetes, VT s/p ICD, HTN, STEMI, combined systolic and diastolic CHF EF 78-29%, atrial fibrillation, BPHadmitted on 5/12/2021with chest pain, shortness of breath, and CHF with NSTEMI. CRRT stopped. Plans for full comfort care.    I met today at Mr. Daryl Rodriguez bedside with wife, daughter, son, granddaughter, friend/neighbor. We discussed plan moving forward. We will begin opioid infusion to ensure comfort as he is overall comfortable but a little restless. We discussed plan once comfortable on infusion we will begin titrating off other infusions with goal of full comfort care. We also discussed that the next step would be to titrate down oxygen as long as he remains comfortable and without shortness of breath. Also discussed transition to comfort screen to unburden alarms and allow for more comfortably environment for patient and family. Family agree with this process and have no further questions or concerns. They are happy that he was able to spend time with family and speak with everyone he wished too. They are at peace this is what he desires and they just want him comfortable.   All questions/concerns addressed. Emotional support provided. RN Lennox Grumbles at bedside and plan discussed.   Exam: Sleepy but easily arouses. Mouth dry and difficult to understand speech. Slightly restless. No distress. Breathing regular, unlabored on HFNC 15L. Abd soft. Generalized weakness.   Plan: - Transition to full comfort care today. Plan for low dose opioid infusion and d/c of all interventions/medications not adding to comfort.   [place], NP Palliative Medicine Team Pager [phone] (Please see [email or web address] for schedule) Team Phone [phone]    Greater than 50%  of this time was spent counseling and coordinating care related to the above assessment and plan

## 2020-02-04 NOTE — Progress Notes (Signed)
Advanced Heart Failure Rounding Note  PCP-Cardiologist: [name], MD     Patient Profile   86 y/o male with h.o CAD, DM2, VT s/p ICD, HTN admitted with NSTEMI and acute systolic HF. Cath by Dr. [name] revealed severe 2v CAD in LD and LCX with borderline ostial LM lesion. Filling pressures markedly elevated with low output. Lactic acid up. IABP placed and milrinone started. Echo w/ EF 40-45%. Seen by TCTS and felt not to be surgical candidate. Plan is for staged PCI.   Subjective:    5/14 was in AF w/ RVR and developed CP. Converted w/ IV amio 5/15 IABP removed 5/16 developed recurrent pulmonary edema, improved w/ IV Lasix 5/17 found to have UTI. Foley removed. Cultures pending. On broad spectrum abx, Vanc + ceftriaxone. mTemp past 24 hrs 101.6. CBC pending.   5/18 back in Afib, in the setting of UTI. Rebolused w/ Amio==>conversion back to NSR 5/20 PCI/DES with CSI of LAD 5/22 started CVVHD  Remains on pressors. CVVHD stopped.  Patient and family not transitioning to comfort care. Morphine gtt just started. Says he feels ok. No CP or SOB.    Objective:   Weight Range: 94.3 kg Body mass index is 30.7 kg/m.   Vital Signs:   Pulse Rate:  [59-105] 62 (05/27 0900) Resp:  [15-31] 18 (05/27 0900) BP: (87-112)/(38-64) 97/59 (05/27 0900) SpO2:  [87 %-96 %] 92 % (05/27 0900) Last BM Date: 01/29/20  Weight change: Filed Weights   01/27/20 0425 01/28/20 0740 01/29/20 0600  Weight: 98.5 kg 98.2 kg 94.3 kg    Intake/Output:   Intake/Output Summary (Last 24 hours) at 02/01/2020 1114 Last data filed at Feb 01, 2020 0700 Gross per 24 hour  Intake 947.99 ml  Output --  Net 947.99 ml      Physical Exam   General:  Elderly. Lethargic but awake and interactive  No resp difficulty HEENT: normal Neck: supple. RIJ trialysis. Carotids 2+ bilat; no bruits. No lymphadenopathy or thryomegaly appreciated. Cor: PMI nondisplaced. Regular rate & rhythm. No rubs, gallops or  murmurs. Lungs: clear Abdomen: soft, nontender, nondistended. No hepatosplenomegaly. No bruits or masses. Good bowel sounds. Extremities: no cyanosis, clubbing, rash, 1+ edema Neuro: Elderly. Lethargic but awake and interactive    Telemetry   No longer hooked up   Labs    CBC Recent Labs    01/28/20 0402 01/29/20 0324  WBC 18.0* 17.7*  HGB 8.0* 8.1*  HCT 24.8* 25.3*  MCV 101.2* 101.2*  PLT 328 [id number]   Basic Metabolic Panel Recent Labs    01/28/20 0402 01/28/20 0402 01/28/20 1600 01/29/20 0324  NA 133*   < > 130* 131*  K 4.6   < > 5.1 4.5  CL 96*   < > 95* 95*  CO2 24   < > 24 25  GLUCOSE 204*   < > 326* 170*  BUN 16   < > 16 18  CREATININE 1.72*   < > 1.52* 1.61*  CALCIUM 8.6*   < > 8.2* 8.6*  MG 2.4  --   --  2.4  PHOS 2.4*   < > 1.8* 1.9*   < > = values in this interval not displayed.   Liver Function Tests Recent Labs    01/28/20 1600 01/29/20 0324  ALBUMIN 2.2* 2.5*   No results for input(s): LIPASE, AMYLASE in the last 72 hours. Cardiac Enzymes No results for input(s): CKTOTAL, CKMB, CKMBINDEX, TROPONINI in the last 72 hours.  BNP: BNP (last  3 results) Recent Labs    01/18/2020 1403  BNP 597.8*    ProBNP (last 3 results) No results for input(s): PROBNP in the last 8760 hours.   D-Dimer No results for input(s): DDIMER in the last 72 hours. Hemoglobin A1C No results for input(s): HGBA1C in the last 72 hours. Fasting Lipid Panel No results for input(s): CHOL, HDL, LDLCALC, TRIG, CHOLHDL, LDLDIRECT in the last 72 hours. Thyroid Function Tests No results for input(s): TSH, T4TOTAL, T3FREE, THYROIDAB in the last 72 hours.  Invalid input(s): FREET3  Other results:   Imaging    No results found.   Medications:     Scheduled Medications: . chlorhexidine  15 mL Mouth Rinse BID  . Chlorhexidine Gluconate Cloth  6 each Topical Daily  . feeding supplement (ENSURE ENLIVE)  237 mL Oral TID BM  . glycopyrrolate  0.4 mg Intravenous TID  .  latanoprost  1 drop Both Eyes QHS  . mouth rinse  15 mL Mouth Rinse q12n4p  . sodium chloride flush  10-40 mL Intracatheter Q12H    Infusions: . sodium chloride Stopped (01/16/20 1752)  . HYDROmorphone 0.2 mg/hr (February 07, 2020 1035)  . norepinephrine (LEVOPHED) Adult infusion 15 mcg/min (2020-02-07 0700)    PRN Medications: sodium chloride, acetaminophen **OR** acetaminophen, diphenhydrAMINE, glycopyrrolate, HYDROmorphone, HYDROmorphone (DILAUDID) injection, lip balm, LORazepam, nitroGLYCERIN, ondansetron (ZOFRAN) IV, polyvinyl alcohol, sodium chloride flush   Assessment/Plan   1. NSTEMI  -Known coronary disease. In 2019 had DES to RCA.  -LHC 01/27/2020 with multivessel disease including LM disease  - On ASA 81   - On atorvastatin 80 mg daily  - case reviewed extensively with Dr. [name] and Dr.Owen. Agree that best plan is for MV atherectomy/stenting including possible ostial LM. - IABP removal 5/15. No active CP - Underwent CSI with PCI/DES to LAD on [id number] complicated by slow reflow  - On DAPT and heparin  2. Cardiogenic Shock/Acute Systolic Heart Failure in the setting of NSTEMI - ICM. ECHO 2020 EF 55%---> EF now down 40-45%  - IABP placed in cath lab 5/12. Stable after IABP removal 5/15 - On milrione 0.2 mcg + Norepi 9 mcg .  - Developed recurrent pulmonary edema post PCI - CRRT initiated 5/22. Unable to pull due to low BP - CRRT stopped 5/26 with transition to comfort care  3. Acute Hypoxic Respiratory Failure due to pulmonary edema -Has been on and off BiPap.  - Now on HFNC - Morphine gtt started for comfort  4. AKI  - Creatinine baseline 1-1.2  - Renal function pending.   - Likely due to HF/shock/ATN + CIN -. CRRT initiated 5/22. Unable to pull due to low BP. Sopped 5/26   Patient transitioning to comfort care. Family at bedside. Morphine started. Titrate for comfort. Stop pressors.  Appreciate Palliative Care team;s efforts.     Length of Stay: [place], MD  07-Feb-2020, 11:14 AM  Advanced Heart Failure Team Pager [phone] (M-F; [place])  Please contact [facility] for night-coverage after hours (4p -7a ) and weekends on [email or web address]

## 2020-02-04 DEATH — deceased

## 2020-02-05 ENCOUNTER — Telehealth (HOSPITAL_COMMUNITY): Payer: Self-pay

## 2020-02-05 NOTE — Telephone Encounter (Signed)
Received a fax from [facility] requesting that Dr. Haroldine Laws sign patients death certificate so he can be cremated. Death certificate was signed and faxed back successfully to [phone]

## 2020-02-11 NOTE — Telephone Encounter (Signed)
Received original death certificate, form completed and signed by Dr Haroldine Laws and mailed to [facility] Dept.

## 2020-03-05 NOTE — Discharge Summary (Addendum)
  Advanced Heart Failure Death Summary  Death Summary   Patient ID: Daryl Rodriguez MRN: [id number], DOB/AGE: 1934-04-30 86 y.o. Admit date: 2020/01/29 Time of Death:   February 13, 2020 at 1339    Primary Discharge Diagnoses:  CAD with NSTEMI  Acute Systolic Heart Failure--->Cardiogenic Shock  Acute Hypoxemic Respiratory Failure  AKI Atrial Fibrillation RVR  Muscle Weakness  Inadequate Oral Intake related to nausea  UTI Hyponatremia     Hospital Course:  Mr Banner was a 86 y/o male with a history of CAD, DM2, VT s/p ICD, HTN admitted with NSTEMI and acute systolic HF. Cath by Dr. [name] revealed severe 2v CAD in LD and LCX with borderline ostial LM lesion. Filling pressures markedly elevated with low output. Lactic acid up. IABP placed and milrinone started. Echo w/ EF 40-45%. Seen by [facility] and felt not to be surgical candidate. Plan for high-risk staged PCI of high-grade lesions in LAD and LCX.  Underwent PCI of LAD with extensive atherectomy on [id number] complicated by partial no-reflow. Unfortunately, he developed progressive renal failure failure and volume overload post PCI despite inotrope support.  He did not respond to pressors and high dose IV lasix.  Nephrology was consulted. He was placed on CVVHD on 01/25/20. He remained hypotension despite pressors and addition of midodrine.   He was referred to Palliative Care for goals of care.He decided he did not want to continue dialysis and wanted to  transition to comfort care. His family was present and supportive of his wishes. On 02-13-20 dialysis was stopped and he passed quietly with family at the bedside.  Please see chart for additional details.   Significant Diagnostic Studies [id number] Severe multivessel disease:  Ostial Left Main 30 to 40%,  severe diffuse proximal to mid LAD segmental lesions of 60% with an intervening shelflike lesion in segment of 95% (calcified). Shortly downstream of this lesion there was another 80%  stenosis; LCx as proximal 30 and 60% lesions just after the AV groove circumflex. Then in the mid section there is a long segment of roughly 80% stenosis followed by a long lateral OM branch/distal LCx; patent RCA stent with diffuse mild disease-bifurcates into RPDA with ostial and proximal 60% long lesion as well as a little larger posterior AV groove branch that gives off 1 small OM1 large posterolateral branch with 80% mid lesion. SEVERE PULMONARY VENOUS HYPERTENSION consistent with ACUTE COMBINED SYSTOLIC AND DIASTOLIC HEART FAILURE: LV P-EDP 126/18 mmHg - 33 mmHg; AOP 125/58 mmHg-MAP 78 mmHg. RAP mean 13-15 mmHg; RVP-EDP 65/3 mmHg - 13 mmHg PAP-mean 73/23 mmHg - 42 mmHg; PCWP 46/62 (large V wave)-mean 39 mmHg   5/20 PCI/DES with CSI of LAD  Consultations  CT Surgery  Nephrology Palliative Care  PT Dietitian    Duration of Discharge Encounter: Greater than 35 minutes   Signed, Darrick Grinder, NP 02/05/2020, 9:41 PM   Agree with above.   Glori Bickers, MD  10:27 AM

## 2020-03-06 ENCOUNTER — Encounter: Payer: Medicare HMO | Admitting: Internal Medicine

## 2020-04-24 ENCOUNTER — Ambulatory Visit: Payer: Medicare HMO | Admitting: Cardiology

## 2020-05-29 IMAGING — DX DG CHEST 1V PORT
1 series · 1 of 1 positions shown · non-contrast
Comparison: Film from earlier in the same day.

CLINICAL DATA: Check central venous catheter placement

EXAM:
PORTABLE CHEST 1 VIEW

[chest]
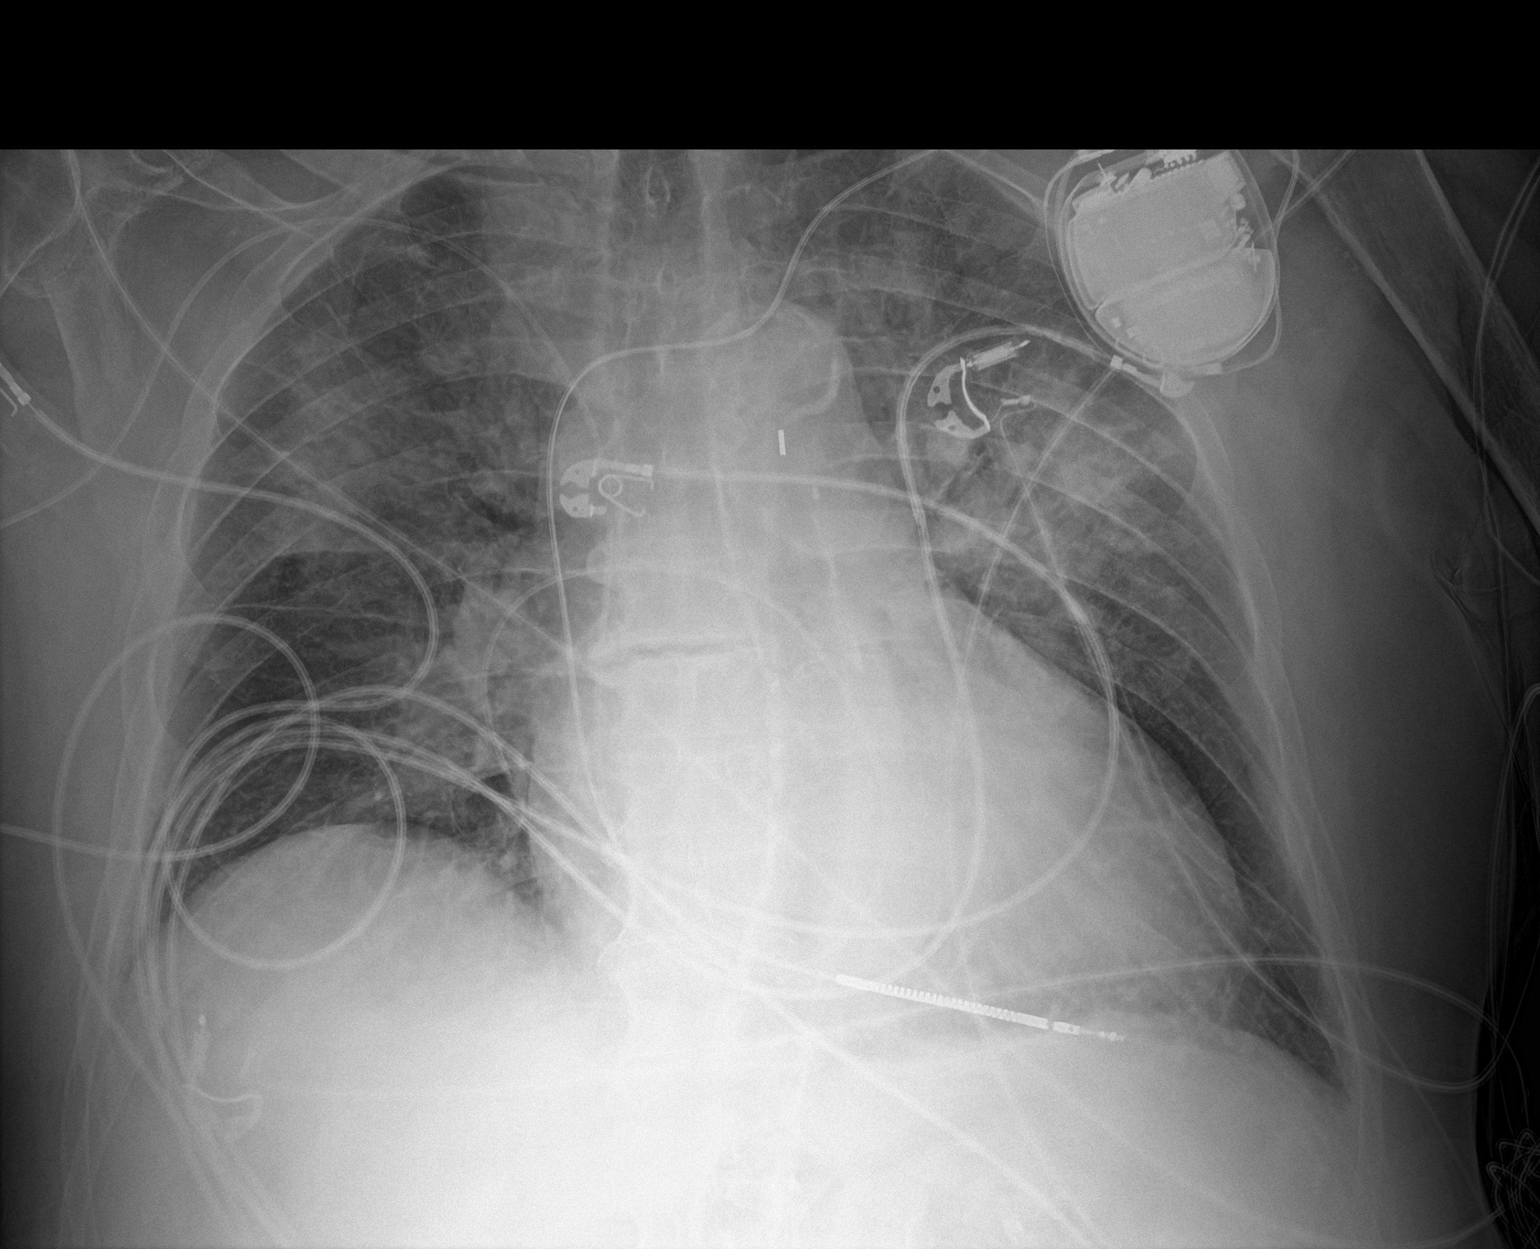

[1 of 1 positions shown; findings below may reference images not displayed]

FINDINGS: Cardiac shadow remains enlarged. Pacing device is again seen.
Intra-aortic balloon pump is noted just below the aortic knob new
from the prior exam. Patchy parenchymal opacities are noted
bilaterally likely related increasing edema. No sizable effusion is
seen. No pneumothorax is noted. Right-sided PICC line is seen within
the midportion of the right atrium. This could be withdrawn 2-3 cm.
IMPRESSION: Intra-aortic balloon pump as described above.

Right-sided PICC line deep in the right atrium which could be
withdrawn 2-3 cm.

Increasing parenchymal edema bilaterally when compared with the
prior exam.

## 2020-05-29 IMAGING — DX DG CHEST 1V PORT
1 series · 1 of 1 positions shown · non-contrast
Comparison: March 26, 2018.

CLINICAL DATA: Shortness of breath.

EXAM:
PORTABLE CHEST 1 VIEW

[chest ap]
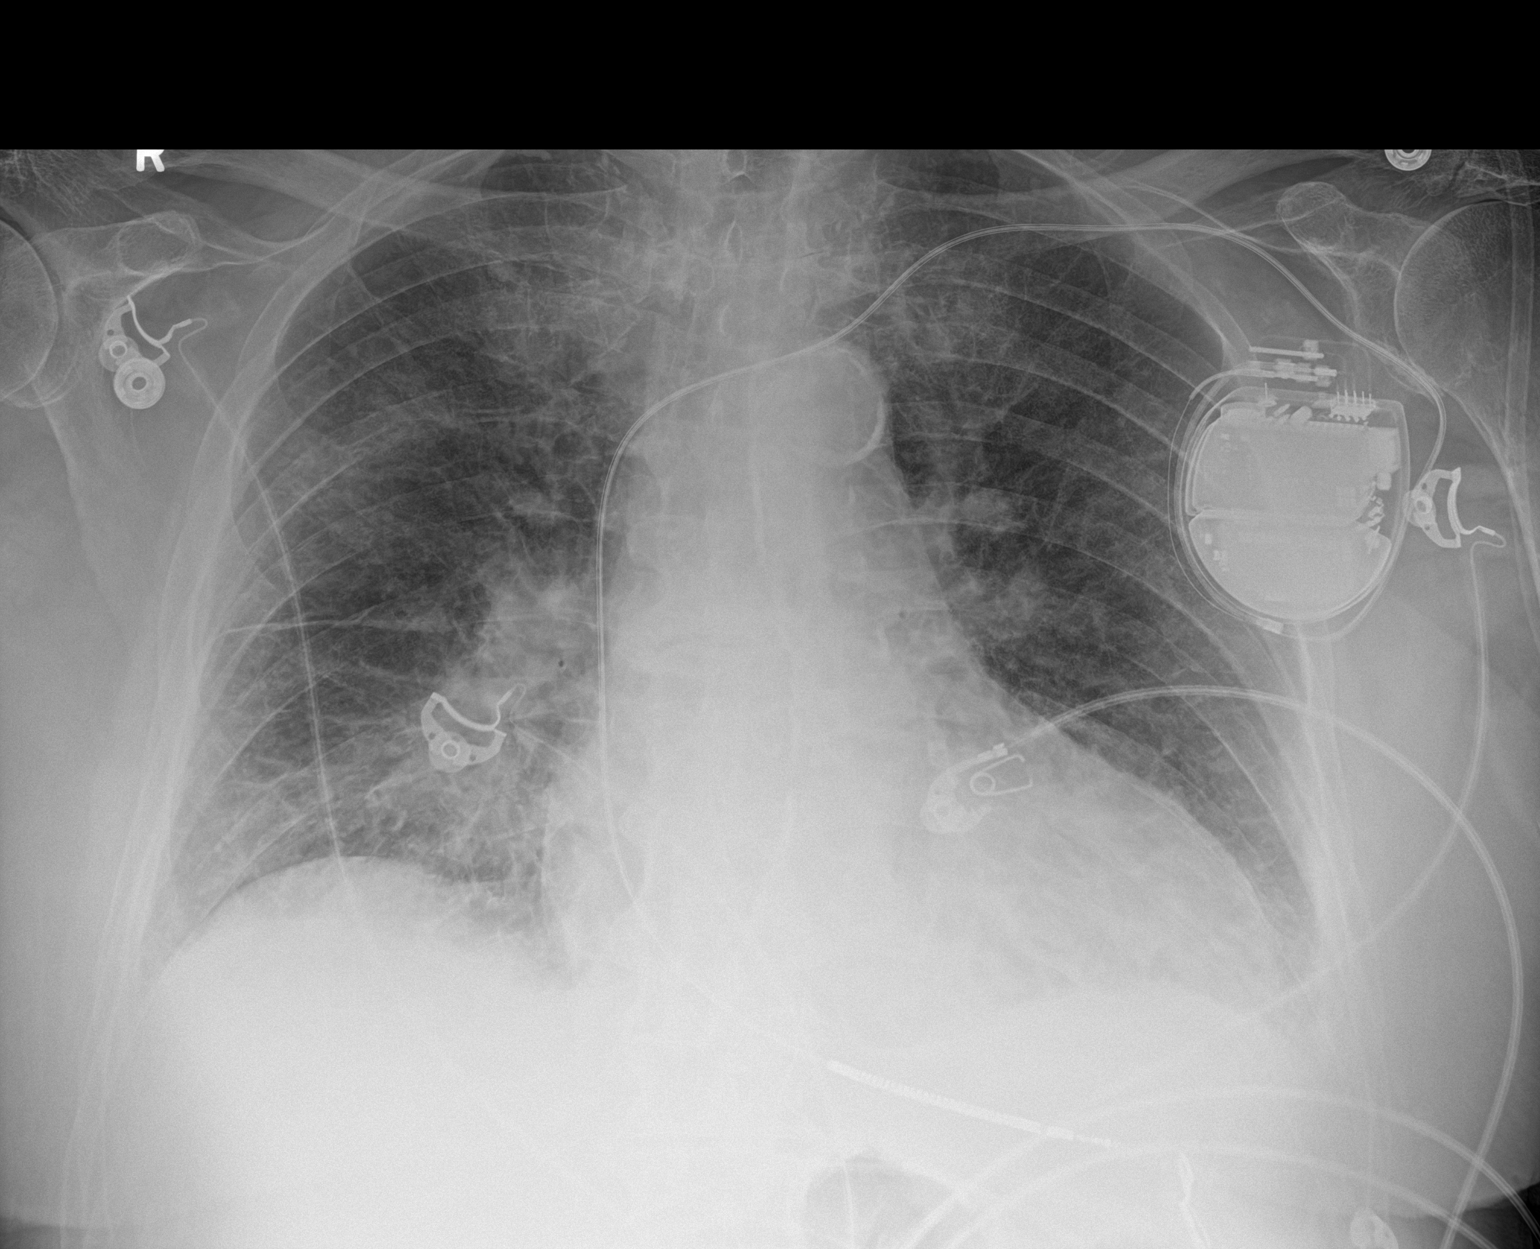

[1 of 1 positions shown; findings below may reference images not displayed]

FINDINGS: Stable cardiomediastinal silhouette. Mild central pulmonary vascular
congestion is noted. Left-sided pacemaker is unchanged in position.
Atherosclerosis of thoracic aorta is noted. No pneumothorax or
pleural effusion is noted. Mild bibasilar subsegmental atelectasis
or edema is noted. Bony thorax is unremarkable.
IMPRESSION: Aortic atherosclerosis. Stable mild central pulmonary vascular
congestion. Mild bibasilar subsegmental atelectasis or edema is
noted.

Aortic Atherosclerosis ([id number]).

## 2020-06-02 IMAGING — DX DG CHEST 1V PORT
1 series · 1 of 1 positions shown · non-contrast
Comparison: 01/15/2019

CLINICAL DATA: Increasing hypoxia

EXAM:
PORTABLE CHEST 1 VIEW

[chest ap]
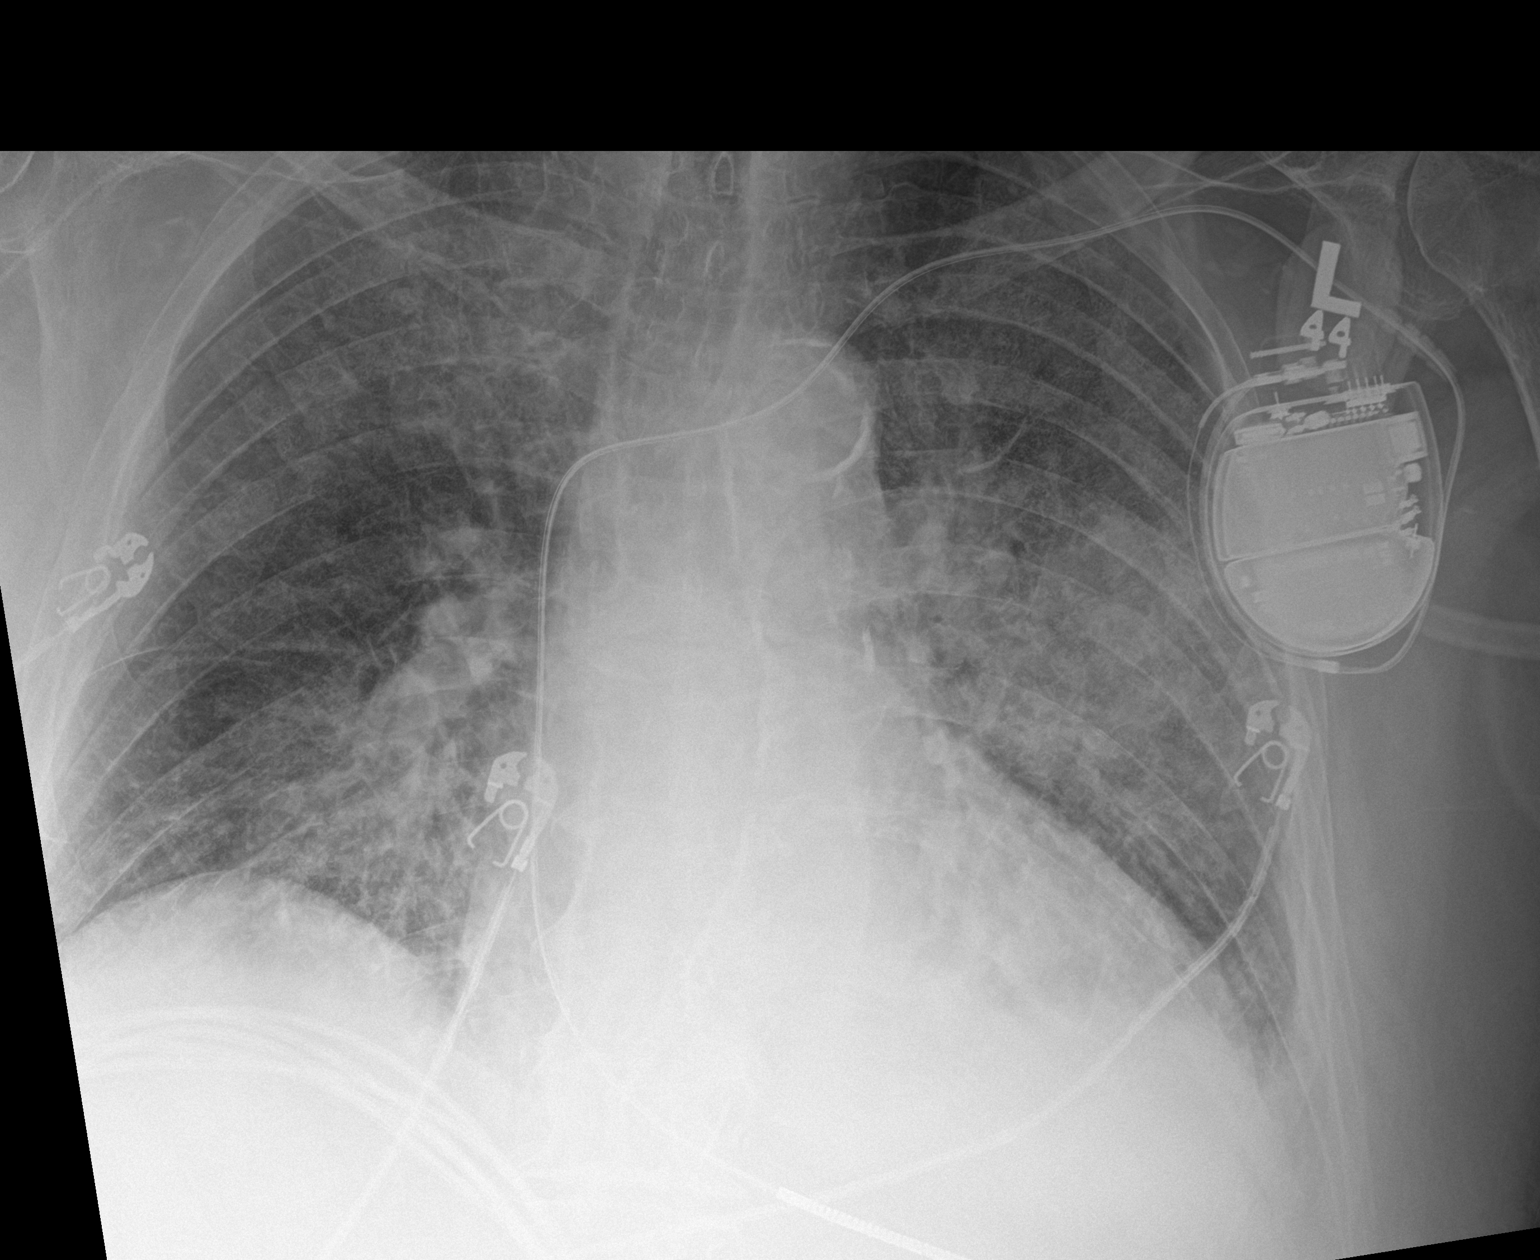

[1 of 1 positions shown; findings below may reference images not displayed]

FINDINGS: Cardiac shadow is enlarged. Defibrillator is again noted and stable.
Aortic calcifications are seen. Patchy increasing parenchymal edema
is noted particularly in the left mid lung. Right-sided PICC line is
again noted and stable. Intra-aortic balloon pump has been removed
in the interval.
IMPRESSION: Increasing pulmonary edema.
